# Patient Record
Sex: Male | Born: 1945 | Race: White | Hispanic: No | Marital: Married | Smoking: Never smoker
Health system: Southern US, Community
[De-identification: ages and names within clinical notes are randomized; demographics above are authoritative.]

## PROBLEM LIST (undated history)

## (undated) DIAGNOSIS — R972 Elevated prostate specific antigen [PSA]: Secondary | ICD-10-CM

## (undated) DIAGNOSIS — K219 Gastro-esophageal reflux disease without esophagitis: Secondary | ICD-10-CM

## (undated) DIAGNOSIS — R1013 Epigastric pain: Secondary | ICD-10-CM

## (undated) DIAGNOSIS — I255 Ischemic cardiomyopathy: Secondary | ICD-10-CM

## (undated) DIAGNOSIS — C61 Malignant neoplasm of prostate: Secondary | ICD-10-CM

## (undated) DIAGNOSIS — Z95810 Presence of automatic (implantable) cardiac defibrillator: Secondary | ICD-10-CM

## (undated) DIAGNOSIS — I10 Essential (primary) hypertension: Secondary | ICD-10-CM

## (undated) DIAGNOSIS — I82609 Acute embolism and thrombosis of unspecified veins of unspecified upper extremity: Secondary | ICD-10-CM

## (undated) DIAGNOSIS — K222 Esophageal obstruction: Secondary | ICD-10-CM

## (undated) DIAGNOSIS — C099 Malignant neoplasm of tonsil, unspecified: Secondary | ICD-10-CM

## (undated) DIAGNOSIS — E785 Hyperlipidemia, unspecified: Secondary | ICD-10-CM

## (undated) DIAGNOSIS — Z951 Presence of aortocoronary bypass graft: Secondary | ICD-10-CM

## (undated) DIAGNOSIS — I739 Peripheral vascular disease, unspecified: Secondary | ICD-10-CM

## (undated) DIAGNOSIS — I219 Acute myocardial infarction, unspecified: Secondary | ICD-10-CM

## (undated) DIAGNOSIS — I8290 Acute embolism and thrombosis of unspecified vein: Secondary | ICD-10-CM

## (undated) DIAGNOSIS — I779 Disorder of arteries and arterioles, unspecified: Secondary | ICD-10-CM

## (undated) DIAGNOSIS — F909 Attention-deficit hyperactivity disorder, unspecified type: Secondary | ICD-10-CM

## (undated) DIAGNOSIS — R9430 Abnormal result of cardiovascular function study, unspecified: Secondary | ICD-10-CM

## (undated) DIAGNOSIS — IMO0002 Reserved for concepts with insufficient information to code with codable children: Secondary | ICD-10-CM

## (undated) DIAGNOSIS — Z4502 Encounter for adjustment and management of automatic implantable cardiac defibrillator: Secondary | ICD-10-CM

## (undated) DIAGNOSIS — I2510 Atherosclerotic heart disease of native coronary artery without angina pectoris: Secondary | ICD-10-CM

## (undated) HISTORY — DX: Acute embolism and thrombosis of unspecified veins of unspecified upper extremity: I82.609

## (undated) HISTORY — PX: CORONARY ARTERY BYPASS GRAFT: SHX141

## (undated) HISTORY — PX: OTHER SURGICAL HISTORY: SHX169

## (undated) HISTORY — DX: Esophageal obstruction: K22.2

## (undated) HISTORY — DX: Malignant neoplasm of prostate: C61

## (undated) HISTORY — PX: CYSTECTOMY: SUR359

## (undated) HISTORY — DX: Essential (primary) hypertension: I10

## (undated) HISTORY — DX: Elevated prostate specific antigen (PSA): R97.20

## (undated) HISTORY — DX: Hyperlipidemia, unspecified: E78.5

## (undated) HISTORY — DX: Gastro-esophageal reflux disease without esophagitis: K21.9

## (undated) HISTORY — DX: Abnormal result of cardiovascular function study, unspecified: R94.30

## (undated) HISTORY — DX: Presence of aortocoronary bypass graft: Z95.1

## (undated) HISTORY — DX: Encounter for adjustment and management of automatic implantable cardiac defibrillator: Z45.02

## (undated) HISTORY — DX: Disorder of arteries and arterioles, unspecified: I77.9

## (undated) HISTORY — DX: Peripheral vascular disease, unspecified: I73.9

## (undated) HISTORY — PX: ROTATOR CUFF REPAIR: SHX139

## (undated) HISTORY — DX: Ischemic cardiomyopathy: I25.5

## (undated) HISTORY — DX: Malignant neoplasm of tonsil, unspecified: C09.9

## (undated) HISTORY — DX: Attention-deficit hyperactivity disorder, unspecified type: F90.9

## (undated) HISTORY — DX: Acute embolism and thrombosis of unspecified vein: I82.90

## (undated) HISTORY — DX: Epigastric pain: R10.13

## (undated) HISTORY — PX: HAND CONTRACTURE RELEASE: SHX1724

## (undated) HISTORY — DX: Reserved for concepts with insufficient information to code with codable children: IMO0002

## (undated) HISTORY — DX: Atherosclerotic heart disease of native coronary artery without angina pectoris: I25.10

---

## 1996-11-28 DIAGNOSIS — I219 Acute myocardial infarction, unspecified: Secondary | ICD-10-CM

## 1996-11-28 HISTORY — DX: Acute myocardial infarction, unspecified: I21.9

## 1999-12-08 ENCOUNTER — Encounter: Payer: Self-pay | Admitting: Internal Medicine

## 1999-12-08 ENCOUNTER — Encounter: Admission: RE | Admit: 1999-12-08 | Discharge: 1999-12-08 | Payer: Self-pay | Admitting: Internal Medicine

## 2002-03-13 ENCOUNTER — Encounter: Admission: RE | Admit: 2002-03-13 | Discharge: 2002-03-13 | Payer: Self-pay | Admitting: Orthopedic Surgery

## 2002-03-13 ENCOUNTER — Encounter: Payer: Self-pay | Admitting: Orthopedic Surgery

## 2002-03-14 ENCOUNTER — Encounter (INDEPENDENT_AMBULATORY_CARE_PROVIDER_SITE_OTHER): Payer: Self-pay | Admitting: Specialist

## 2002-03-14 ENCOUNTER — Ambulatory Visit (HOSPITAL_BASED_OUTPATIENT_CLINIC_OR_DEPARTMENT_OTHER): Admission: RE | Admit: 2002-03-14 | Discharge: 2002-03-14 | Payer: Self-pay | Admitting: Orthopedic Surgery

## 2003-11-29 HISTORY — PX: TONSILLECTOMY: SUR1361

## 2004-01-07 ENCOUNTER — Encounter: Admission: RE | Admit: 2004-01-07 | Discharge: 2004-01-07 | Payer: Self-pay | Admitting: Otolaryngology

## 2004-01-09 ENCOUNTER — Ambulatory Visit (HOSPITAL_COMMUNITY): Admission: RE | Admit: 2004-01-09 | Discharge: 2004-01-09 | Payer: Self-pay | Admitting: Otolaryngology

## 2004-01-09 ENCOUNTER — Encounter (INDEPENDENT_AMBULATORY_CARE_PROVIDER_SITE_OTHER): Payer: Self-pay | Admitting: *Deleted

## 2004-01-09 ENCOUNTER — Ambulatory Visit (HOSPITAL_BASED_OUTPATIENT_CLINIC_OR_DEPARTMENT_OTHER): Admission: RE | Admit: 2004-01-09 | Discharge: 2004-01-09 | Payer: Self-pay | Admitting: Otolaryngology

## 2004-01-19 ENCOUNTER — Ambulatory Visit: Admission: RE | Admit: 2004-01-19 | Discharge: 2004-04-07 | Payer: Self-pay | Admitting: *Deleted

## 2004-01-27 ENCOUNTER — Ambulatory Visit (HOSPITAL_COMMUNITY): Admission: RE | Admit: 2004-01-27 | Discharge: 2004-01-27 | Payer: Self-pay | Admitting: *Deleted

## 2004-02-02 ENCOUNTER — Encounter: Admission: RE | Admit: 2004-02-02 | Discharge: 2004-02-02 | Payer: Self-pay | Admitting: Dentistry

## 2004-02-05 ENCOUNTER — Ambulatory Visit (HOSPITAL_COMMUNITY): Admission: RE | Admit: 2004-02-05 | Discharge: 2004-02-05 | Payer: Self-pay | Admitting: *Deleted

## 2004-05-07 ENCOUNTER — Ambulatory Visit: Admission: RE | Admit: 2004-05-07 | Discharge: 2004-05-07 | Payer: Self-pay | Admitting: *Deleted

## 2004-05-20 ENCOUNTER — Ambulatory Visit (HOSPITAL_COMMUNITY): Admission: RE | Admit: 2004-05-20 | Discharge: 2004-05-20 | Payer: Self-pay | Admitting: Orthopaedic Surgery

## 2004-08-13 ENCOUNTER — Ambulatory Visit: Admission: RE | Admit: 2004-08-13 | Discharge: 2004-08-13 | Payer: Self-pay | Admitting: *Deleted

## 2004-08-27 ENCOUNTER — Ambulatory Visit: Admission: RE | Admit: 2004-08-27 | Discharge: 2004-08-27 | Payer: Self-pay | Admitting: *Deleted

## 2004-10-18 ENCOUNTER — Ambulatory Visit: Payer: Self-pay | Admitting: Cardiology

## 2004-11-25 ENCOUNTER — Ambulatory Visit (HOSPITAL_COMMUNITY): Admission: RE | Admit: 2004-11-25 | Discharge: 2004-11-25 | Payer: Self-pay | Admitting: *Deleted

## 2004-11-26 ENCOUNTER — Ambulatory Visit (HOSPITAL_COMMUNITY): Admission: RE | Admit: 2004-11-26 | Discharge: 2004-11-26 | Payer: Self-pay | Admitting: *Deleted

## 2004-12-03 ENCOUNTER — Ambulatory Visit: Admission: RE | Admit: 2004-12-03 | Discharge: 2004-12-03 | Payer: Self-pay | Admitting: *Deleted

## 2004-12-08 ENCOUNTER — Ambulatory Visit: Admission: RE | Admit: 2004-12-08 | Discharge: 2004-12-08 | Payer: Self-pay | Admitting: *Deleted

## 2004-12-10 ENCOUNTER — Ambulatory Visit: Payer: Self-pay | Admitting: Cardiology

## 2005-03-10 ENCOUNTER — Ambulatory Visit: Payer: Self-pay | Admitting: Cardiology

## 2005-06-14 ENCOUNTER — Ambulatory Visit: Payer: Self-pay | Admitting: Cardiology

## 2005-11-15 ENCOUNTER — Encounter: Payer: Self-pay | Admitting: Cardiology

## 2005-11-15 ENCOUNTER — Ambulatory Visit: Payer: Self-pay

## 2006-01-03 ENCOUNTER — Ambulatory Visit: Payer: Self-pay | Admitting: Cardiology

## 2006-04-06 ENCOUNTER — Ambulatory Visit: Payer: Self-pay | Admitting: Cardiology

## 2006-05-04 ENCOUNTER — Ambulatory Visit: Payer: Self-pay | Admitting: Internal Medicine

## 2006-10-03 ENCOUNTER — Ambulatory Visit (HOSPITAL_COMMUNITY): Admission: RE | Admit: 2006-10-03 | Discharge: 2006-10-04 | Payer: Self-pay | Admitting: Orthopaedic Surgery

## 2006-12-05 ENCOUNTER — Ambulatory Visit: Payer: Self-pay | Admitting: Cardiovascular Disease

## 2006-12-05 HISTORY — PX: CARDIAC CATHETERIZATION: SHX172

## 2006-12-06 ENCOUNTER — Inpatient Hospital Stay (HOSPITAL_COMMUNITY): Admission: RE | Admit: 2006-12-06 | Discharge: 2006-12-07 | Payer: Self-pay | Admitting: Cardiovascular Disease

## 2006-12-12 ENCOUNTER — Inpatient Hospital Stay (HOSPITAL_COMMUNITY): Admission: EM | Admit: 2006-12-12 | Discharge: 2006-12-15 | Payer: Self-pay | Admitting: Emergency Medicine

## 2006-12-18 ENCOUNTER — Ambulatory Visit: Payer: Self-pay | Admitting: Internal Medicine

## 2006-12-20 ENCOUNTER — Ambulatory Visit: Payer: Self-pay

## 2006-12-20 ENCOUNTER — Ambulatory Visit: Payer: Self-pay | Admitting: Cardiovascular Disease

## 2006-12-25 ENCOUNTER — Ambulatory Visit: Payer: Self-pay | Admitting: Internal Medicine

## 2007-01-08 ENCOUNTER — Ambulatory Visit: Payer: Self-pay | Admitting: Cardiology

## 2007-01-17 ENCOUNTER — Ambulatory Visit: Payer: Self-pay | Admitting: Internal Medicine

## 2007-01-18 LAB — CONVERTED CEMR LAB
Basophils Absolute: 0 10*3/uL (ref 0.0–0.1)
Eosinophils Absolute: 0.2 10*3/uL (ref 0.0–0.6)
Hemoglobin: 14.1 g/dL (ref 13.0–17.0)
Lymphocytes Relative: 18.6 % (ref 12.0–46.0)
MCHC: 33 g/dL (ref 30.0–36.0)
Monocytes Absolute: 0.3 10*3/uL (ref 0.2–0.7)
Monocytes Relative: 5.5 % (ref 3.0–11.0)
Neutro Abs: 3.9 10*3/uL (ref 1.4–7.7)

## 2007-01-22 ENCOUNTER — Ambulatory Visit: Payer: Self-pay | Admitting: Internal Medicine

## 2007-01-22 ENCOUNTER — Ambulatory Visit: Payer: Self-pay | Admitting: Cardiology

## 2007-02-12 ENCOUNTER — Ambulatory Visit: Payer: Self-pay | Admitting: Internal Medicine

## 2007-05-16 ENCOUNTER — Ambulatory Visit: Payer: Self-pay | Admitting: Internal Medicine

## 2007-08-15 ENCOUNTER — Ambulatory Visit: Payer: Self-pay | Admitting: Internal Medicine

## 2008-01-01 ENCOUNTER — Ambulatory Visit: Payer: Self-pay | Admitting: Internal Medicine

## 2008-04-01 ENCOUNTER — Ambulatory Visit: Payer: Self-pay | Admitting: Internal Medicine

## 2008-07-01 ENCOUNTER — Ambulatory Visit: Payer: Self-pay | Admitting: Internal Medicine

## 2008-08-13 ENCOUNTER — Telehealth: Payer: Self-pay | Admitting: Internal Medicine

## 2008-08-22 ENCOUNTER — Ambulatory Visit: Payer: Self-pay | Admitting: Internal Medicine

## 2008-08-22 LAB — CONVERTED CEMR LAB
AST: 27 units/L (ref 0–37)
Albumin: 3.9 g/dL (ref 3.5–5.2)
Alkaline Phosphatase: 76 units/L (ref 39–117)
BUN: 23 mg/dL (ref 6–23)
Chloride: 105 meq/L (ref 96–112)
Cholesterol: 176 mg/dL (ref 0–200)
GFR calc non Af Amer: 91 mL/min
Glucose, Bld: 105 mg/dL — ABNORMAL HIGH (ref 70–99)
LDL Cholesterol: 83 mg/dL (ref 0–99)
Potassium: 4.4 meq/L (ref 3.5–5.1)

## 2008-08-23 ENCOUNTER — Encounter: Payer: Self-pay | Admitting: Internal Medicine

## 2008-08-23 DIAGNOSIS — K222 Esophageal obstruction: Secondary | ICD-10-CM | POA: Insufficient documentation

## 2008-08-23 DIAGNOSIS — Z8719 Personal history of other diseases of the digestive system: Secondary | ICD-10-CM | POA: Insufficient documentation

## 2008-08-23 DIAGNOSIS — F909 Attention-deficit hyperactivity disorder, unspecified type: Secondary | ICD-10-CM | POA: Insufficient documentation

## 2008-08-23 DIAGNOSIS — C099 Malignant neoplasm of tonsil, unspecified: Secondary | ICD-10-CM | POA: Insufficient documentation

## 2008-10-08 ENCOUNTER — Encounter: Payer: Self-pay | Admitting: Internal Medicine

## 2008-10-13 ENCOUNTER — Ambulatory Visit: Payer: Self-pay | Admitting: Internal Medicine

## 2008-10-21 ENCOUNTER — Ambulatory Visit (HOSPITAL_COMMUNITY): Admission: RE | Admit: 2008-10-21 | Discharge: 2008-10-21 | Payer: Self-pay | Admitting: Orthopaedic Surgery

## 2008-10-29 ENCOUNTER — Encounter: Payer: Self-pay | Admitting: Internal Medicine

## 2008-12-01 ENCOUNTER — Encounter: Payer: Self-pay | Admitting: Internal Medicine

## 2008-12-22 ENCOUNTER — Encounter: Payer: Self-pay | Admitting: Internal Medicine

## 2009-01-12 ENCOUNTER — Encounter: Payer: Self-pay | Admitting: Internal Medicine

## 2009-01-12 ENCOUNTER — Ambulatory Visit: Payer: Self-pay | Admitting: Cardiology

## 2009-01-12 LAB — CONVERTED CEMR LAB
AST: 25 units/L (ref 0–37)
HDL: 70.1 mg/dL (ref >39.0)
Total Bilirubin: 1.1 mg/dL (ref 0.3–1.2)
Total CHOL/HDL Ratio: 2.8

## 2009-01-13 ENCOUNTER — Emergency Department (HOSPITAL_COMMUNITY): Admission: EM | Admit: 2009-01-13 | Discharge: 2009-01-13 | Payer: Self-pay | Admitting: Emergency Medicine

## 2009-01-14 ENCOUNTER — Encounter: Payer: Self-pay | Admitting: Internal Medicine

## 2009-01-14 ENCOUNTER — Ambulatory Visit: Payer: Self-pay | Admitting: Cardiology

## 2009-02-17 ENCOUNTER — Encounter: Payer: Self-pay | Admitting: Internal Medicine

## 2009-02-17 ENCOUNTER — Ambulatory Visit: Payer: Self-pay | Admitting: Internal Medicine

## 2009-03-30 ENCOUNTER — Encounter: Payer: Self-pay | Admitting: Internal Medicine

## 2009-05-19 ENCOUNTER — Ambulatory Visit: Payer: Self-pay | Admitting: Internal Medicine

## 2009-05-21 ENCOUNTER — Telehealth: Payer: Self-pay | Admitting: Cardiology

## 2009-06-01 ENCOUNTER — Encounter: Payer: Self-pay | Admitting: Internal Medicine

## 2009-07-16 ENCOUNTER — Encounter: Payer: Self-pay | Admitting: Cardiology

## 2009-08-25 ENCOUNTER — Ambulatory Visit: Payer: Self-pay | Admitting: Internal Medicine

## 2009-08-27 ENCOUNTER — Encounter: Payer: Self-pay | Admitting: Internal Medicine

## 2009-09-03 ENCOUNTER — Encounter: Payer: Self-pay | Admitting: Internal Medicine

## 2009-10-30 ENCOUNTER — Ambulatory Visit: Payer: Self-pay | Admitting: Cardiology

## 2009-11-05 ENCOUNTER — Encounter: Payer: Self-pay | Admitting: Cardiology

## 2009-11-05 LAB — CONVERTED CEMR LAB
ALT: 19 units/L (ref 0–53)
AST: 24 units/L (ref 0–37)
Alkaline Phosphatase: 85 units/L (ref 39–117)
Bilirubin, Direct: 0.1 mg/dL (ref 0.0–0.3)
Cholesterol: 173 mg/dL (ref 0–200)
LDL Cholesterol: 91 mg/dL (ref 0–99)
Total Bilirubin: 0.9 mg/dL (ref 0.3–1.2)
Total CHOL/HDL Ratio: 3

## 2009-11-24 ENCOUNTER — Ambulatory Visit: Payer: Self-pay | Admitting: Internal Medicine

## 2009-11-26 ENCOUNTER — Encounter (INDEPENDENT_AMBULATORY_CARE_PROVIDER_SITE_OTHER): Payer: Self-pay | Admitting: *Deleted

## 2009-12-17 ENCOUNTER — Encounter: Payer: Self-pay | Admitting: Internal Medicine

## 2009-12-21 ENCOUNTER — Telehealth (INDEPENDENT_AMBULATORY_CARE_PROVIDER_SITE_OTHER): Payer: Self-pay | Admitting: *Deleted

## 2009-12-23 ENCOUNTER — Ambulatory Visit (HOSPITAL_COMMUNITY): Admission: RE | Admit: 2009-12-23 | Discharge: 2009-12-23 | Payer: Self-pay | Admitting: Orthopedic Surgery

## 2009-12-30 ENCOUNTER — Ambulatory Visit: Payer: Self-pay | Admitting: Cardiology

## 2010-03-02 ENCOUNTER — Ambulatory Visit: Payer: Self-pay | Admitting: Internal Medicine

## 2010-04-28 ENCOUNTER — Encounter: Payer: Self-pay | Admitting: Cardiology

## 2010-05-04 ENCOUNTER — Ambulatory Visit: Payer: Self-pay | Admitting: Internal Medicine

## 2010-05-04 DIAGNOSIS — J019 Acute sinusitis, unspecified: Secondary | ICD-10-CM | POA: Insufficient documentation

## 2010-06-01 ENCOUNTER — Ambulatory Visit: Payer: Self-pay | Admitting: Internal Medicine

## 2010-06-08 ENCOUNTER — Encounter: Payer: Self-pay | Admitting: Internal Medicine

## 2010-09-01 ENCOUNTER — Encounter: Payer: Self-pay | Admitting: Internal Medicine

## 2010-09-02 ENCOUNTER — Ambulatory Visit: Payer: Self-pay | Admitting: Internal Medicine

## 2010-09-08 ENCOUNTER — Encounter: Payer: Self-pay | Admitting: Internal Medicine

## 2010-10-06 ENCOUNTER — Ambulatory Visit (HOSPITAL_COMMUNITY): Admission: RE | Admit: 2010-10-06 | Discharge: 2010-10-06 | Payer: Self-pay | Admitting: Orthopedic Surgery

## 2010-10-11 ENCOUNTER — Ambulatory Visit (HOSPITAL_COMMUNITY): Admission: RE | Admit: 2010-10-11 | Discharge: 2010-10-11 | Payer: Self-pay | Admitting: Orthopedic Surgery

## 2010-10-26 ENCOUNTER — Telehealth: Payer: Self-pay | Admitting: Internal Medicine

## 2010-11-28 DIAGNOSIS — C61 Malignant neoplasm of prostate: Secondary | ICD-10-CM

## 2010-11-28 HISTORY — DX: Malignant neoplasm of prostate: C61

## 2010-12-09 ENCOUNTER — Ambulatory Visit
Admission: RE | Admit: 2010-12-09 | Discharge: 2010-12-09 | Payer: Self-pay | Source: Home / Self Care | Attending: Internal Medicine | Admitting: Internal Medicine

## 2010-12-10 ENCOUNTER — Encounter: Payer: Self-pay | Admitting: Internal Medicine

## 2010-12-20 ENCOUNTER — Telehealth: Payer: Self-pay | Admitting: Cardiology

## 2010-12-21 ENCOUNTER — Other Ambulatory Visit: Payer: Self-pay | Admitting: Cardiology

## 2010-12-21 ENCOUNTER — Ambulatory Visit
Admission: RE | Admit: 2010-12-21 | Discharge: 2010-12-21 | Payer: Self-pay | Source: Home / Self Care | Attending: Cardiology | Admitting: Cardiology

## 2010-12-21 LAB — LIPID PANEL
Cholesterol: 187 mg/dL (ref 0–200)
Triglycerides: 105 mg/dL (ref 0.0–149.0)

## 2010-12-21 LAB — HEPATIC FUNCTION PANEL
ALT: 25 U/L (ref 0–53)
Total Bilirubin: 0.9 mg/dL (ref 0.3–1.2)
Total Protein: 6.6 g/dL (ref 6.0–8.3)

## 2010-12-24 ENCOUNTER — Encounter: Payer: Self-pay | Admitting: Cardiology

## 2010-12-26 ENCOUNTER — Encounter (INDEPENDENT_AMBULATORY_CARE_PROVIDER_SITE_OTHER): Payer: Self-pay | Admitting: *Deleted

## 2010-12-28 NOTE — Letter (Signed)
Summary: Remote Device Check  [employer], Main Office  [address]   [place], [place] [address]   Phone: [phone]  Fax: [phone]     September 08, 2010 MRN: [id number]   [facility] [address] [place], [place]  [address]   Dear Mr. Brutus,   Your remote transmission was recieved and reviewed by your physician.  All diagnostics were within normal limits for you.  __X___Your next transmission is scheduled for:  12-09-2010.  Please transmit at any time this day.  If you have a wireless device your transmission will be sent automatically.   Sincerely,  Vella Kohler

## 2010-12-28 NOTE — Letter (Signed)
Summary: Lipid reminder  [facility], Main Office  [address]   [place], [place] [address]   Phone: [phone]  Fax: [phone]        April 28, 2010 MRN: [id number]    [facility] [address] [place], [place]  [address]    Dear [name],  Our records indicate it is time to check your cholesterol.  Please call our office to schedule an appt for labwork.  Please remember it is a fasting lab.     Sincerely,  Meredith Staggers, RN Willa Rough, MD This letter has been electronically signed by your physician.

## 2010-12-28 NOTE — Letter (Signed)
Summary: [facility]  [facility]   Imported By: Sherian Rein 09/18/2010 10:18:52  _____________________________________________________________________  External Attachment:    Type:   Image     Comment:   External Document

## 2010-12-28 NOTE — Progress Notes (Signed)
  Phone Note From Other Clinic   Caller: Liborio Nixon Details for Reason: Pt.Information Initial call taken by: Denny Peon    Faxed 12 lead over to [facility] to fax [phone] [facility]  December 21, 2009 12:54 PM

## 2010-12-28 NOTE — Assessment & Plan Note (Signed)
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   He is s/p ICD implant.  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  He received an ICD in 2008. is not having any chest pain.  He has no signs of heart failure.  He still does some carpentry.  He notes that he takes 50 mg of carvedilol once daily because he cannot remember to take his evening dose.  Current Medications (verified): 1)  Nexium 40 Mg Cpdr (Esomeprazole Magnesium) .Marland Kitchen.. 1 Once Daily 2)  Lipitor 80 Mg Tabs (Atorvastatin Calcium) .... Take 1 Tablet By Mouth Once A Day 3)  Carvedilol 25 Mg Tabs (Carvedilol) .... 2 Tabs Once Daily 4)  Diovan 80 Mg Tabs (Valsartan) .... Take 1 Tablet By Mouth Once A Day 5)  Vyvanse 70 Mg Caps (Lisdexamfetamine Dimesylate) .... Take 2 Tablet By Mouth Every Morning 6)  Lexapro 20 Mg Tabs (Escitalopram Oxalate) .... Take 1 Tablet By Mouth Once A Day 7)  Aspirin 81 Mg  Tabs (Aspirin) .... Take 1 Tablet By Mouth Once A Day 8)  Multivitamins   Tabs (Multiple Vitamin) .... Take 1 Tablet By Mouth Once A Day 9)  Ocuvite Preservision  Tabs (Multiple Vitamins-Minerals) .Marland Kitchen.. 1 By Mouth Two Times A Day 10)  Buspirone Hcl 15 Mg Tabs (Buspirone Hcl) .Marland Kitchen.. 1 Tab Once Daily 11)  Elations Supplement .Marland KitchenMarland Kitchen. 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 catheterization 2008... all grafts patent CABG... 1991  HYPERTENSION  (ICD-401.9) HYPERLIPIDEMIA (ICD-272.4) GERD Left arm venous thrombosis  Past Surgical History: Last updated: 01/12/2009 Coronary artery bypass graft- Rhea Pink  '91 Rotator cuff repair '05 Tonsillectomy '05 for malignant lesion, w/  XRT Cystectomy hand ICD placement L thumb surgery  Review of Systems  The patient denies chest pain, syncope, dyspnea on exertion, and peripheral edema.    Vital Signs:  Patient profile:   65 year old male Height:      72 inches Weight:      200 pounds BMI:     27.22 Pulse rate:   49 / minute BP sitting:   140 / 80  (left arm)  Vitals Entered By: Laurance Flatten CMA (March 02, 2010 10:36 AM)  Physical Exam  General:  patient is stable in general. Head:  normocephalic and no abnormalities observed.   Eyes:  no xanthelasma. Mouth:  Oral mucosa and oropharynx without lesions or exudates.  Teeth in good repair. Neck:  no jugular venous distention. Chest Wall:  Well healed device incision. Lungs:  lungs are clear.  Respiratory effort is nonlabored. Heart:  cardiac exam reveals S1-S2.  No clicks or significant murmurs. PMI is enlarged and laterally displaced. Abdomen:  abdomen is soft. Msk:  patient is wearing a post surgical cast on his right hand area Extremities:  no peripheral edema. Neurologic:  alert & oriented X3, cranial nerves II-XII intact, strength normal in all extremities, sensation intact to pinprick, gait normal, and DTRs symmetrical  and normal.      ICD Specifications Following MD:  Lewayne Bunting, MD     ICD Vendor:  [employer]     ICD Model Number:  7232     ICD Serial Number:  [id number] ICD DOI:  12/05/2006     ICD Implanting MD:  Lewayne Bunting, MD  Lead 1:    Location: RV     DOI: 12/05/2006     Model #: 1308     Serial #: [id number]     Status: active  Indications::  ICM, VT   ICD Follow Up Remote Check?  No Battery Voltage:  3.10 V     Charge Time:  7.84 seconds     ICD Dependent:  No       ICD Device  Measurements Right Ventricle:  Amplitude: 16.3 mV, Impedance: 512 ohms, Threshold: 1.0 V at 0.2 msec Shock Impedance: 41/53 ohms   Episodes Shock:  0     ATP:  0     Nonsustained:  2      Brady Parameters Mode VVI     Lower Rate Limit:  40      Tachy Zones VF:  194     VT:  250     VT1:  150     Next Remote Date:  06/01/2010     Next Cardiology Appt Due:  02/27/2011 Tech Comments:  No parameter changes.  Device function normal.  Checked by industry.  Carelink transmissions every 3 months.  ROV 1 year with Dr. [name]. Altha Harm, LPN  March 03, 9527 10:27 AM  MD Comments:  Agree with above.  Impression & Recommendations:  Problem # 1:  AUTOMATIC IMPLANTABLE CARDIAC DEFIBRILLATOR SITU (ICD-V45.02) His device is working normally.  He will followup in several months.  Problem # 2:  CARDIOMYOPATHY, ISCHEMIC (ICD-414.8) He denies anginal symptoms  Continue current meds except we will see if we can switch him to long acting Coreg. His updated medication list for this problem includes:    Carvedilol 25 Mg Tabs (Carvedilol) .Marland Kitchen... 2 tabs once daily    Diovan 80 Mg Tabs (Valsartan) .Marland Kitchen... Take 1 tablet by mouth once a day    Aspirin 81 Mg Tabs (Aspirin) .Marland Kitchen... Take 1 tablet by mouth once a day  Problem # 3:  ESSENTIAL HYPERTENSION (ICD-401.9) Continue current meds except for the change in carvedilol. His updated medication list for this problem includes:    Carvedilol 25 Mg Tabs (Carvedilol) .Marland Kitchen... 2 tabs once daily    Diovan 80 Mg Tabs (Valsartan) .Marland Kitchen... Take 1 tablet by mouth once a day    Aspirin 81 Mg Tabs (Aspirin) .Marland Kitchen... Take 1 tablet by mouth once a day  Patient Instructions: 1)  Your physician recommends that you schedule a follow-up appointment in: 12 months with Dr [name]

## 2010-12-28 NOTE — Cardiovascular Report (Signed)
Summary: Office Visit Remote   Office Visit Remote   Imported By: Roderic Ovens 06/09/2010 12:03:46  _____________________________________________________________________  External Attachment:    Type:   Image     Comment:   External Document

## 2010-12-28 NOTE — Assessment & Plan Note (Signed)
Summary: dr men pt  cough/bloody mucus  stc   Vital Signs:  Patient profile:   65 year old male Height:      72 inches Weight:      195.50 pounds BMI:     26.61 O2 Sat:      96 % on Room air Temp:     98.3 degrees F oral Pulse rate:   63 / minute Pulse rhythm:   regular Resp:     16 per minute BP sitting:   140 / 70  (left arm) Cuff size:   large  Vitals Entered By: Rock Nephew CMA (May 04, 2010 11:49 AM)  Nutrition Counseling: Patient's BMI is greater than 25 and therefore counseled on weight management options.  O2 Flow:  Room air  Primary Care Provider:  Jacques Navy MD  CC:  URI symptoms.  History of Present Illness:  URI Symptoms      This is a 65 year old man who presents with URI symptoms.  The symptoms began 3 weeks ago.  The severity is described as mild.  The patient reports nasal congestion, purulent nasal discharge, sore throat, productive cough, and sick contacts, but denies dry cough and earache.  Associated symptoms include fever of 100.5-103 degrees.  The patient denies stiff neck, dyspnea, wheezing, rash, vomiting, diarrhea, use of an antipyretic, and response to antipyretic.  The patient denies sneezing, seasonal symptoms, headache, muscle aches, and severe fatigue.  Risk factors for Strep sinusitis include unilateral facial pain, unilateral nasal discharge, poor response to decongestant, double sickening, and tooth pain.  The patient denies the following risk factors for Strep sinusitis: Strep exposure, tender adenopathy, and absence of cough.    Preventive Screening-Counseling & Management  Alcohol-Tobacco     Alcohol drinks/day: 3     Alcohol type: beer     Smoking Status: quit      Drug Use:  no.    Medications Prior to Update: 1)  Nexium 40 Mg Cpdr (Esomeprazole Magnesium) .Marland Kitchen.. 1 Once Daily 2)  Lipitor 80 Mg Tabs (Atorvastatin Calcium) .... Take 1 Tablet By Mouth Once A Day 3)  Carvedilol 25 Mg Tabs (Carvedilol) .... 2 Tabs Once Daily 4)   Diovan 80 Mg Tabs (Valsartan) .... Take 1 Tablet By Mouth Once A Day 5)  Vyvanse 70 Mg Caps (Lisdexamfetamine Dimesylate) .... Take 2 Tablet By Mouth Every Morning 6)  Lexapro 20 Mg Tabs (Escitalopram Oxalate) .... Take 1 Tablet By Mouth Once A Day 7)  Aspirin 81 Mg  Tabs (Aspirin) .... Take 1 Tablet By Mouth Once A Day 8)  Multivitamins   Tabs (Multiple Vitamin) .... Take 1 Tablet By Mouth Once A Day 9)  Ocuvite Preservision  Tabs (Multiple Vitamins-Minerals) .Marland Kitchen.. 1 By Mouth Two Times A Day 10)  Buspirone Hcl 15 Mg Tabs (Buspirone Hcl) .Marland Kitchen.. 1 Tab Once Daily 11)  Elations Supplement .Marland KitchenMarland Kitchen. 1 8oz Drink Once Daily  Current Medications (verified): 1)  Nexium 40 Mg Cpdr (Esomeprazole Magnesium) .Marland Kitchen.. 1 Once Daily 2)  Lipitor 80 Mg Tabs (Atorvastatin Calcium) .... Take 1 Tablet By Mouth Once A Day 3)  Carvedilol 25 Mg Tabs (Carvedilol) .... 2 Tabs Once Daily 4)  Diovan 80 Mg Tabs (Valsartan) .... Take 1 Tablet By Mouth Once A Day 5)  Vyvanse 70 Mg Caps (Lisdexamfetamine Dimesylate) .... Take 2 Tablet By Mouth Every Morning 6)  Lexapro 20 Mg Tabs (Escitalopram Oxalate) .... Take 1 Tablet By Mouth Once A Day 7)  Aspirin 81  Mg  Tabs (Aspirin) .... Take 1 Tablet By Mouth Once A Day 8)  Multivitamins   Tabs (Multiple Vitamin) .... Take 1 Tablet By Mouth Once A Day 9)  Ocuvite Preservision  Tabs (Multiple Vitamins-Minerals) .Marland Kitchen.. 1 By Mouth Two Times A Day 10)  Buspirone Hcl 15 Mg Tabs (Buspirone Hcl) .Marland Kitchen.. 1 Tab Once Daily 11)  Elations Supplement .Marland KitchenMarland Kitchen. 1 8oz Drink Once Daily 12)  Cefuroxime Axetil 500 Mg Tabs (Cefuroxime Axetil) .... 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 placed 2008 (episodes of sustained  monomorphic VT in the past) ESOPHAGEAL STRICTURE (ICD-530.3) DYSPEPSIA, HX OF (ICD-V12.79) CAD (ICD-414.00)... catheterization 2008... all grafts patent CABG... 1991  HYPERTENSION (ICD-401.9) HYPERLIPIDEMIA (ICD-272.4) GERD Left arm venous thrombosis  Past Surgical History: Last updated: Jan 23, 2009 Coronary artery bypass graft- Krista Blue, SVG-OM,Cx,RCA  '91 Rotator cuff repair '05 Tonsillectomy '05 for malignant lesion, w/  XRT Cystectomy hand ICD placement L thumb surgery  Family History: Last updated: Jan 23, 2009 father- deceased @69 : CAD/CHF mother -30: physically stable, alzheimer's Neg-prostate or colon cancer; DM He has 1 brother who has had bypass surgery and is living  Social History: Last updated: 01/23/2009 [employer] - 4 years married '76-  0 children work: retired, Administrator, Civil Service for [employer] marriage in good health; wife in good health. Tobacco Use - Former.  Alcohol Use - yes Drug Use - no  Risk Factors: Alcohol Use: 3 (05/04/2010) Caffeine Use: 1 (08/22/2008) Exercise: no (08/22/2008)  Risk Factors: Smoking Status: quit (05/04/2010)  Family History: Reviewed history from 01-23-09 and no changes required. father- deceased @69 : CAD/CHF mother -43: physically stable, alzheimer's Neg-prostate or colon cancer; DM He has 1 brother who has had bypass surgery and is living  Social History: Reviewed history from January 23, 2009 and no changes required. 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 Tobacco Use - Former.  Alcohol Use - yes Drug Use - no  Review of Systems       The patient complains of prolonged cough.  The patient denies anorexia, fever, weight loss, decreased hearing, hoarseness, chest pain, syncope, dyspnea on exertion, peripheral edema, headaches, hemoptysis, abdominal pain, hematuria, suspicious skin lesions,  enlarged lymph nodes, and angioedema.    Physical Exam  General:  alert, well-developed, well-nourished, well-hydrated, appropriate dress, normal appearance, healthy-appearing, cooperative to examination, and good hygiene.   Head:  normocephalic, atraumatic, no abnormalities observed, and no abnormalities palpated.   Eyes:  No corneal or conjunctival inflammation noted. EOMI. Perrla. Funduscopic exam benign, without hemorrhages, exudates or papilledema. Vision grossly normal. Ears:  External ear exam shows no significant lesions or deformities.  Otoscopic examination reveals clear canals, tympanic membranes are intact bilaterally without bulging, retraction, inflammation or discharge. Hearing is grossly normal bilaterally. Nose:  External nasal examination shows no deformity or inflammation. Nasal mucosa are pink and moist without lesions or exudates. Mouth:  Oral mucosa and oropharynx without lesions or exudates.  Teeth in good repair. Neck:  supple, full ROM, no masses, no thyromegaly, no thyroid nodules or tenderness, no JVD, normal carotid upstroke, and no carotid bruits.   Lungs:  normal respiratory effort, no intercostal retractions, no accessory muscle use,  normal breath sounds, no dullness, no fremitus, no crackles, and no wheezes.   Heart:  normal rate, regular rhythm, no murmur, no gallop, no rub, and no JVD.   Abdomen:  soft, non-tender, normal bowel sounds, no distention, no masses, no guarding, no rigidity, no rebound tenderness, no hepatomegaly, and no splenomegaly.   Msk:  normal ROM, no joint tenderness, no joint swelling, no joint warmth, no redness over joints, no joint deformities, no joint instability, and no crepitation.   Pulses:  R and L carotid,radial,femoral,dorsalis pedis and posterior tibial pulses are full and equal bilaterally Extremities:  No clubbing, cyanosis, edema, or deformity noted with normal full range of motion of all joints.   Neurologic:  No cranial nerve  deficits noted. Station and gait are normal. Plantar reflexes are down-going bilaterally. DTRs are symmetrical throughout. Sensory, motor and coordinative functions appear intact. Skin:  turgor normal, color normal, no rashes, no suspicious lesions, no ecchymoses, no petechiae, no purpura, no ulcerations, and no edema.   Cervical Nodes:  no anterior cervical adenopathy and no posterior cervical adenopathy.   Axillary Nodes:  no R axillary adenopathy and no L axillary adenopathy.   Inguinal Nodes:  no R inguinal adenopathy and no L inguinal adenopathy.   Psych:  Cognition and judgment appear intact. Alert and cooperative with normal attention span and concentration. No apparent delusions, illusions, hallucinations   Impression & Recommendations:  Problem # 1:  SINUSITIS- ACUTE-NOS (ICD-461.9) Assessment New  His updated medication list for this problem includes:    Cefuroxime Axetil 500 Mg Tabs (Cefuroxime axetil) ..... One by mouth two times a day for 10 days  Instructed on treatment. Call if symptoms persist or worsen.   Problem # 2:  COUGH (ICD-786.2) Assessment: New look for pna, mass, edema Orders: T-2 View CXR (71020TC)  Complete Medication List: 1)  Nexium 40 Mg Cpdr (Esomeprazole magnesium) .Marland Kitchen.. 1 once daily 2)  Lipitor 80 Mg Tabs (Atorvastatin calcium) .... Take 1 tablet by mouth once a day 3)  Carvedilol 25 Mg Tabs (Carvedilol) .... 2 tabs once daily 4)  Diovan 80 Mg Tabs (Valsartan) .... Take 1 tablet by mouth once a day 5)  Vyvanse 70 Mg Caps (Lisdexamfetamine dimesylate) .... Take 2 tablet by mouth every morning 6)  Lexapro 20 Mg Tabs (Escitalopram oxalate) .... Take 1 tablet by mouth once a day 7)  Aspirin 81 Mg Tabs (Aspirin) .... Take 1 tablet by mouth once a day 8)  Multivitamins Tabs (Multiple vitamin) .... Take 1 tablet by mouth once a day 9)  Ocuvite Preservision Tabs (Multiple vitamins-minerals) .Marland Kitchen.. 1 by mouth two times a day 10)  Buspirone Hcl 15 Mg Tabs  (Buspirone hcl) .Marland Kitchen.. 1 tab once daily 11)  Elations Supplement  .Marland KitchenMarland Kitchen. 1 8oz drink once daily 12)  Cefuroxime Axetil 500 Mg Tabs (Cefuroxime axetil) .... One by mouth two times a day for 10 days  Patient Instructions: 1)  Please schedule a follow-up appointment in 2 weeks. 2)  Take your antibiotic as prescribed until ALL of it is gone, but stop if you develop a rash or swelling and contact our office as soon as possible. 3)  Acute sinusitis symptoms for less than 10 days are not helped by antibiotics.Use warm moist compresses, and over the counter decongestants ( only as directed). Call if no improvement in 5-7 days, sooner if increasing pain, fever, or new symptoms. Prescriptions: CEFUROXIME AXETIL 500 MG TABS (CEFUROXIME AXETIL) One by mouth two times a day for 10 days  #  20 x 1   Entered and Authorized by:   Etta Grandchild MD   Signed by:   Etta Grandchild MD on 05/04/2010   Method used:   Print then Give to Patient   RxID:   [id number]

## 2010-12-28 NOTE — Letter (Signed)
Summary: Remote Device Check  [employer], Main Office  [address]   [place], [place] [address]   Phone: [phone]  Fax: [phone]     June 08, 2010 MRN: [id number]   [facility] [address] [place], [place]  [address]   Dear Mr. Foor,   Your remote transmission was recieved and reviewed by your physician.  All diagnostics were within normal limits for you.  __X___Your next transmission is scheduled for:  09-02-2010.  Please transmit at any time this day.  If you have a wireless device your transmission will be sent automatically.    Sincerely,  Vella Kohler

## 2010-12-28 NOTE — Progress Notes (Signed)
Summary: Call Report  Phone Note Other Incoming   Caller: [facility] Summary of Call: [facility] Triage Call Report Triage Record Num: [id number] Operator: Elita Boone Patient Name: [name] Call Date & Time: 10/22/2010 4:15:34PM Patient Phone: [phone] PCP: Illene Regulus Patient Gender: Male PCP Fax : [phone] Patient DOB: 1946/07/05 Practice Name: [facility] Reason for Call: Pt reports that he has bites on hands and at waist. Pt reports that he has had swelling both arm, tougue and lips.Pt reports that he has been using bendryl. Pt reports that right forarm and hand are a "little swollen". Home care advice given for bug bites. Call back parameters given. No emergent s/s. Protocol(s) Used: Bites and Stings - Insects or Spiders Recommended Outcome per Protocol: Provide Home/Self Care Reason for Outcome: Itchy bites Care Advice: Topical corticosteroid cream/gel may be used as directed on label or by pharmacist. DO NOT apply a dressing to the area.  ~ Call EMS 911 if any of the following occur within 24 hours of bite/sting: loss of consciousness, sudden onset of difficulty breathing or wheezing, chest pain or tightness, throat tightness, severe swelling of parts of the body (e.g., eyes, lips, or tongue) other than bite/sting site, abdominal cramps.  ~ If, more than 24 hours after the incident, the sting/bite site or area around sting/bite site becomes increasingly swollen, red or painful, or has a purulent or foul smelling discharge, or red streaks develop leading away from the site, call provider immediately.  ~ Apply cloth-covered ice pack or a cool compress to the area for no more than 20 minutes 4-8 times a day while awake to reduce pain and swelling.  ~  ~ SYMPTOM / CONDITION MANAGEMENT ITCHING RELIEF: - Avoid scratching; may cause further irritation and secondary infection - Take cool showers or baths to relieve itching - If cool water alone does  not relieve itching, try adding 1/2 to 1 cup baking soda or colloidal oatmeal (Aveeno) to bath water - Follow with application of a bland lotion suc Initial call taken by: Margaret Pyle, CMA,  October 26, 2010 9:06 AM

## 2010-12-28 NOTE — Assessment & Plan Note (Signed)
Summary: [name]   Visit Type:  1 yr f/u Referring Provider:  Madaline Guthrie   ( Psych) Primary Provider:  Jacques Navy MD  CC:  CAD.  History of Present Illness: The patient is seen for followup of coronary artery disease.  I've known him for many years.  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  He received an ICD in 2008. is not having any chest pain.  He has no signs of heart failure.  He was about to fall activities.  He's had a problem with contracture in his hand that was fixed surgically.  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 1 Once Daily 2)  Lipitor 80 Mg Tabs (Atorvastatin Calcium) .... Take 1 Tablet By Mouth Once A Day 3)  Carvedilol 25 Mg Tabs (Carvedilol) .... Take 1 Tablet By Mouth Two Times A Day 4)  Diovan 80 Mg Tabs (Valsartan) .... Take 1 Tablet By Mouth Once A Day 5)  Vyvanse 70 Mg Caps (Lisdexamfetamine Dimesylate) .... Take 2 Tablet By Mouth Every Morning 6)  Lexapro 20 Mg Tabs (Escitalopram Oxalate) .... Take 1 Tablet By Mouth Once A Day 7)  Aspirin 81 Mg  Tabs (Aspirin) .... Take 1 Tablet By Mouth Once A Day 8)  Multivitamins   Tabs (Multiple Vitamin) .... Take 1 Tablet By Mouth Once A Day 9)  Ocuvite Preservision  Tabs (Multiple Vitamins-Minerals) .Marland Kitchen.. 1 By Mouth Two Times A Day 10)  Buspirone Hcl 15 Mg Tabs (Buspirone Hcl) .Marland Kitchen.. 1 Tab Once Daily 11)  Elations Supplement .Marland KitchenMarland Kitchen. 1 8oz Drink Once Daily  Allergies (verified): No Known Drug Allergies  Past History:  Past Medical History: Last  updated: 12/30/2009 UCD ATTENTION DEFICIT HYPERACTIVITY DISORDER, ADULT (ICD-314.01) Hx of MALIGNANT NEOPLASM OF TONSIL (ICD-146.0) '05 CARDIOMYOPATHY, ISCHEMIC (ICD-414.8) EF ('09) 25% EF 25-30% ICD... 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 catheterization 2008... all grafts patent CABG... 1991  HYPERTENSION (ICD-401.9) HYPERLIPIDEMIA (ICD-272.4) GERD Left arm venous thrombosis  Review of Systems       Patient denies fever, chills, headache, sweats, rash, change in vision, change in hearing, chest pain, cough, shortness of breath, nausea vomiting, urinary symptoms.  All other systems are reviewed and are negative.  Vital Signs:  Patient profile:   65 year old male Height:      72 inches Weight:      199 pounds BMI:     27.09 Pulse rate:   72 / minute Pulse rhythm:   regular BP sitting:   120 / 80  (left arm) Cuff size:   large  Vitals Entered By: Danielle Rankin, CMA (December 30, 2009 3:17 PM)  Physical Exam  General:  patient is stable in general. Eyes:  no xanthelasma. Neck:  no jugular venous distention. Lungs:  lungs are clear.  Respiratory effort is nonlabored. Heart:  cardiac exam reveals S1-S2.  No clicks or significant murmurs. Abdomen:  abdomen is soft. Msk:  patient is wearing a  post surgical cast on his right hand area Extremities:  no peripheral edema. Skin:  no skin rashes. Psych:  patient is oriented to person time and place.  Affect is normal.    ICD Specifications ICD Vendor:  Medtronic     ICD Model Number:  7232     ICD Serial Number:  [id number] ICD DOI:  12/05/2006     ICD Implanting MD:  Lewayne Bunting, MD  Lead 1:    Location: RV     DOI: 12/05/2006     Model #: [id number]     Serial #: [id number]     Status: active  Indications::  ICM, VT   ICD Follow Up ICD Dependent:  No      Brady Parameters Mode VVI     Lower Rate Limit:  40        Tachy Zones VF:  194     VT:  250     VT1:  150     Impression & Recommendations:  Problem # 1:  * ICD patient's ICD is in place and it will be checked over the next few months.  Problem # 2:  ATTENTION DEFICIT HYPERACTIVITY DISORDER, ADULT (ICD-314.01) his ADD he is doing well.  He sees his medical doctor for this.  Problem # 3:  CARDIOMYOPATHY, ISCHEMIC (ICD-414.8)  The following medications were removed from the medication list:    Altace 10 Mg Caps (Ramipril)    Coreg 25 Mg Tabs (Carvedilol)    Lisinopril-hydrochlorothiazide 10-12.5 Mg Tabs (Lisinopril-hydrochlorothiazide) His updated medication list for this problem includes:    Carvedilol 25 Mg Tabs (Carvedilol) .Marland Kitchen... Take 1 tablet by mouth two times a day    Diovan 80 Mg Tabs (Valsartan) .Marland Kitchen... Take 1 tablet by mouth once a day    Aspirin 81 Mg Tabs (Aspirin) .Marland Kitchen... Take 1 tablet by mouth once a day  Orders: EKG w/ Interpretation (93000) EKG is done on January 26.  I have reviewed this and compared to his prior tracing.  He has old anteroseptal MI with old diffuse anterior T wave changes.  There is no change.  Patient is on appropriate medicine for his cardiomyopathy.  No change in therapy.  Problem # 4:  ESSENTIAL HYPERTENSION (ICD-401.9) blood pressure is under good control today.  No change in therapy.  Problem # 5:  HYPERLIPIDEMIA (ICD-272.4)  His updated medication list for this problem includes:    Lipitor 80 Mg Tabs (Atorvastatin calcium) .Marland Kitchen... Take 1 tablet by mouth once a day I reviewed the lipids with the patient.  He is getting a good response.  No change in therapy.    Patient Instructions: 1)  Your physician recommends that you schedule a follow-up appointment in: 12 months. The office will mail you a reminder card 2 months prior appointment date.

## 2010-12-28 NOTE — Cardiovascular Report (Signed)
Summary: Office Visit Remote   Office Visit Remote   Imported By: Roderic Ovens 12/23/2009 12:26:48  _____________________________________________________________________  External Attachment:    Type:   Image     Comment:   External Document

## 2010-12-28 NOTE — Miscellaneous (Signed)
  Clinical Lists Changes  Problems: Added new problem of * ICD Observations: Added new observation of REFERRING MD: Madaline Guthrie   ( Psych) (12/30/2009 12:16) Added new observation of PAST MED HX: UCD ATTENTION DEFICIT HYPERACTIVITY DISORDER, ADULT (ICD-314.01) Hx of MALIGNANT NEOPLASM OF TONSIL (ICD-146.0) '05 CARDIOMYOPATHY, ISCHEMIC (ICD-414.8) EF ('09) 25% EF 25-30% ICD... 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 catheterization 2008... all grafts patent CABG... 1991  HYPERTENSION (ICD-401.9) HYPERLIPIDEMIA (ICD-272.4) GERD Left arm venous thrombosis  (12/30/2009 12:16) Added new observation of PRIMARY MD: Jacques Navy MD (12/30/2009 12:16)       Past History:  Past Medical History: UCD ATTENTION DEFICIT HYPERACTIVITY DISORDER, ADULT (ICD-314.01) Hx of MALIGNANT NEOPLASM OF TONSIL (ICD-146.0) '05 CARDIOMYOPATHY, ISCHEMIC (ICD-414.8) EF ('09) 25% EF 25-30% ICD... 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 catheterization 2008... all grafts patent CABG... 1991  HYPERTENSION (ICD-401.9) HYPERLIPIDEMIA (ICD-272.4) GERD Left arm venous thrombosis

## 2010-12-28 NOTE — Letter (Signed)
Summary: Remote Device Check  [facility], Main Office  [address]   [place], [place] [address]   Phone: [phone]  Fax: [phone]     December 17, 2009 MRN: [id number]   [facility] [address] [place], [place]  [address]   Dear Mr. Dyck,   Your remote transmission was recieved and reviewed by your physician.  All diagnostics were within normal limits for you.    ___X___Your next office visit is scheduled for:     MARCH 2011 WITH DR [name]. Please call our office to schedule an appointment.    Sincerely,  Proofreader

## 2010-12-30 NOTE — Progress Notes (Signed)
Summary: set up - lab work  Phone Note Call from Patient Call back at [employer] [phone]   Caller: Patient Reason for Call: Talk to Nurse Summary of Call: pt would like to be set up for lab work prior to appt Initial call taken by: Lorne Skeens,  December 20, 2010 10:41 AM  Follow-up for Phone Call        Pt. would like to make an appointment for blood work. Pt. received a reminder letter on 04/28/10 for Cholesterol check. An appointment was made for  fasting lipid panel, LFT's for 12/21/10 at 9:30 AM. Pt. aware.  Follow-up by: Ollen Gross, RN, BSN,  December 20, 2010 11:26 AM

## 2010-12-30 NOTE — Letter (Signed)
Summary: Custom - Lipid  [facility], Main Office  [address]   [place], [place] [address]   Phone: [phone]  Fax: [phone]     December 24, 2010 MRN: [id number]   [name] [address] [place], [place]  [address]   Dear [name],  We have reviewed your cholesterol results.  They are as follows:     Total Cholesterol:    187 (Desirable: less than 200)       HDL  Cholesterol:     74.70  (Desirable: greater than 40 for men and 50 for women)       LDL Cholesterol:       91  (Desirable: less than 100 for low risk and less than 70 for moderate to high risk)       Triglycerides:       105.0  (Desirable: less than 150)  Our recommendations include:  Looks Good, your liver function was also ok, continue current medications.   Call our office at the number listed above if you have any questions.  Lowering your LDL cholesterol is important, but it is only one of a large number of "risk factors" that may indicate that you are at risk for heart disease, stroke or other complications of hardening of the arteries.  Other risk factors include:   A.  Cigarette Smoking* B.  High Blood Pressure* C.  Obesity* D.   Low HDL Cholesterol (see yours above)* E.   Diabetes Mellitus (higher risk if your is uncontrolled) F.  Family history of premature heart disease G.  Previous history of stroke or cardiovascular disease    *These are risk factors YOU HAVE CONTROL OVER.  For more information, visit .  There is now evidence that lowering the TOTAL CHOLESTEROL AND LDL CHOLESTEROL can reduce the risk of heart disease.  The American Heart Association recommends the following guidelines for the treatment of elevated cholesterol:  1.  If there is now current heart disease and less than two risk factors, TOTAL CHOLESTEROL should be less than 200 and LDL CHOLESTEROL should be less than 100. 2.  If there is current heart disease or two or more risk factors, TOTAL  CHOLESTEROL should be less than 200 and LDL CHOLESTEROL should be less than 70.  A diet low in cholesterol, saturated fat, and calories is the cornerstone of treatment for elevated cholesterol.  Cessation of smoking and exercise are also important in the management of elevated cholesterol and preventing vascular disease.  Studies have shown that 30 to 60 minutes of physical activity most days can help lower blood pressure, lower cholesterol, and keep your weight at a healthy level.  Drug therapy is used when cholesterol levels do not respond to therapeutic lifestyle changes (smoking cessation, diet, and exercise) and remains unacceptably high.  If medication is started, it is important to have you levels checked periodically to evaluate the need for further treatment options.  Thank you,    [facility] Team

## 2010-12-31 NOTE — Cardiovascular Report (Signed)
Summary: Office Visit Remote   Office Visit Remote   Imported By: Roderic Ovens 09/09/2010 11:23:03  _____________________________________________________________________  External Attachment:    Type:   Image     Comment:   External Document

## 2011-01-05 NOTE — Letter (Signed)
Summary: Remote Device Check  [facility], Main Office  [address]   [place], [place] [address]   Phone: [phone]  Fax: [phone]     December 26, 2010 MRN: [id number]   [name] [address] [place], [place]  [address]   Dear [name],   Your remote transmission was recieved and reviewed by your physician.  All diagnostics were within normal limits for you.  __X____Your next office visit is scheduled for:  April 2012 with Dr [name]. Please call our office to schedule an appointment.    Sincerely,  Vella Kohler

## 2011-01-05 NOTE — Cardiovascular Report (Signed)
Summary: Office Visit Remote   Office Visit Remote   Imported By: Roderic Ovens 12/27/2010 12:15:20  _____________________________________________________________________  External Attachment:    Type:   Image     Comment:   External Document

## 2011-01-10 ENCOUNTER — Ambulatory Visit (INDEPENDENT_AMBULATORY_CARE_PROVIDER_SITE_OTHER): Payer: BC Managed Care – PPO | Admitting: Cardiology

## 2011-01-10 ENCOUNTER — Encounter: Payer: Self-pay | Admitting: Cardiology

## 2011-01-10 DIAGNOSIS — I6529 Occlusion and stenosis of unspecified carotid artery: Secondary | ICD-10-CM

## 2011-01-10 DIAGNOSIS — I2510 Atherosclerotic heart disease of native coronary artery without angina pectoris: Secondary | ICD-10-CM

## 2011-01-12 ENCOUNTER — Encounter (INDEPENDENT_AMBULATORY_CARE_PROVIDER_SITE_OTHER): Payer: BC Managed Care – PPO

## 2011-01-12 ENCOUNTER — Encounter: Payer: Self-pay | Admitting: Cardiology

## 2011-01-12 DIAGNOSIS — I6529 Occlusion and stenosis of unspecified carotid artery: Secondary | ICD-10-CM

## 2011-01-12 DIAGNOSIS — R0989 Other specified symptoms and signs involving the circulatory and respiratory systems: Secondary | ICD-10-CM

## 2011-01-17 NOTE — Op Note (Signed)
NAMECHRISTOPHERJOHN, SCHIELE            ACCOUNT NO.:  [id number]  MEDICAL RECORD NO.:  [id number]          PATIENT TYPE:  AMB  LOCATION:  SDS                          FACILITY:  [facility]  PHYSICIAN:  Cindee Salt, M.D.       DATE OF BIRTH:  [birth year]  DATE OF PROCEDURE:  10/11/2010 DATE OF DISCHARGE:  10/11/2010                              OPERATIVE REPORT   PREOPERATIVE DIAGNOSIS:  Dupuytren contracture, left middle ring and little fingers.  POSTOPERATIVE DIAGNOSIS:  Dupuytren contracture, left middle ring and little fingers.  OPERATION:  Excision of palmar fascia with V-Y advancements, left middle ring and little fingers.  SURGEON:  Cindee Salt, MD  ASSISTANT:  Carolyne Fiscal, RN  ANESTHESIA:  General.  ANESTHESIOLOGIST:  Guadalupe Maple, MD  HISTORY:  The patient is a 65 year old male with a history of Dupuytren contracture middle ring and little fingers of his left hand.  This has not responded to conservative treatment and has led to progressive flexion deformities.  He is admitted now for fasciectomies with V-Y advancements as dictated by findings.  Pre, peri, and postoperative course have been discussed along with risks and complications.  He is aware that there is no guarantee with the surgery, possibility of infection, recurrence injury to arteries, nerves, tendons, incomplete relief of symptoms, dystrophy.  In the preoperative area, the patient is seen.  The extremity marked by both the patient and surgeon.  Antibiotic given.  PROCEDURE:  The patient was brought to the operating room.  A general anesthetic carried out without difficulty under the direction of Dr. Noreene Larsson.  He was prepped using DuraPrep, supine position, left arm free. A 3-minute dry time was allowed.  Time-out taken confirming the patient and procedure.  The limb was exsanguinated with an Esmarch bandage. Tourniquet placed on the upper arm was inflated to 250 mmHg.  A volar Bruner incision was made over the mid  palm, carried distally to the little finger, carried down through subcutaneous tissue.  Bleeders were electrocauterized with bipolar.  The dissection carried down to palmar fascia.  This was traced proximal to distal after identification of neurovascular bundles.  The dissection was carried out to the proximal phalanx and abductor digiti quinti cord was excised along with natatory ligament cords.  This allowed the finger to be fully straightened.  No further lesions were identified.  The cord to the ring finger was attended to next.  This was identified proximally.  Neurovascular bundles identified, traced distally.  This was done to the level of the A2 pulley.  This was released along with tissue and fascia into the skin.  The middle finger was attended to next.  Again neurovascular structures identified and protected and the dissection carried distally releasing and excising the cord to the middle finger.  The fascial attachment to the flexor retinaculum was released for the index.  The wound was copiously irrigated with saline, sprayed with thrombin spray. Vessel loop drains were placed in the depths of the wound and skin closed after converting the little finger, closed with interrupted 5-0 Vicryl Rapide sutures.  A sterile compressive dressing was applied.  On deflation of the  tourniquet, all fingers immediately pinked.  This was completed along with a dorsal splint for protection.  The patient tolerated the procedure well and was taken to the recovery room for observation in a satisfactory condition.  He will be discharged home to return to the [facility] in 1 week on Vicodin.          ______________________________ Cindee Salt, M.D.     GK/MEDQ  D:  10/11/2010  T:  10/12/2010  Job:  [id number]  Electronically Signed by Cindee Salt M.D. on 01/17/2011 12:23:19 PM

## 2011-01-19 NOTE — Assessment & Plan Note (Signed)
Summary: F1Y- GD   Visit Type:  Follow-up Referring Provider:  Madaline Guthrie   [phone]) Primary Provider:  Jacques Navy MD  CC:  CAD.  History of Present Illness: The patient is seen for followup of coronary disease, left ventricular dysfunction, ICD.  He does extremely well.  He has no signs of heart failure.  He has not had any recurrent chest pain.  He received his ICD in 2008.  This was checked last in April, 2011 and it is also checked at home.  Current Medications (verified): 1)  Nexium 40 Mg Cpdr (Esomeprazole Magnesium) .Marland Kitchen.. 1 Once Daily 2)  Lipitor 80 Mg Tabs (Atorvastatin Calcium) .... Take 1 Tablet By Mouth Once A Day 3)  Carvedilol 25 Mg Tabs (Carvedilol) .... 2 Tabs Once Daily 4)  Diovan 80 Mg Tabs (Valsartan) .... Take 1 Tablet By Mouth Once A Day 5)  Vyvanse 70 Mg Caps (Lisdexamfetamine Dimesylate) .... 2 Tabs Daily 6)  Lexapro 20 Mg Tabs (Escitalopram Oxalate) .... Take 1 Tablet By Mouth Once A Day 7)  Aspirin 81 Mg  Tabs (Aspirin) .... Take 1 Tablet By Mouth Once A Day 8)  Multivitamins   Tabs (Multiple Vitamin) .... Take 1 Tablet By Mouth Once A Day 9)  Ocuvite Preservision  Tabs (Multiple Vitamins-Minerals) .Marland Kitchen.. 1 By Mouth Two Times A Day 10)  Buspirone Hcl 10 Mg Tabs (Buspirone Hcl) .... 2 Tabs Daily 11)  Elations Supplement .Marland KitchenMarland Kitchen. 1 8oz Drink Once Daily 12)  Cetirizine Hcl 10 Mg Tabs (Cetirizine Hcl) .... Two Times A Day As Needed For Hives  Allergies (verified): No Known Drug Allergies  Past History:  Past Medical History: UCD ATTENTION DEFICIT HYPERACTIVITY DISORDER, ADULT (ICD-314.01) Hx of MALIGNANT NEOPLASM OF TONSIL (ICD-146.0) '05 CARDIOMYOPATHY, ISCHEMIC (ICD-414.8) EF ('09) 25% EF 25-30% ICD... 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 catheterization 2008... all grafts patent CABG... 1991  HYPERTENSION (ICD-401.9) HYPERLIPIDEMIA  (ICD-272.4) GERD Left arm venous thrombosis carotid bruit   left... February, 2012  Review of Systems       Patient denies fever, chills, headache, sweats, rash, change in vision, change in hearing, chest pain, cough, nausea vomiting, urinary symptoms.  All the systems are reviewed and are negative.  Vital Signs:  Patient profile:   65 year old male Weight:      209 pounds Pulse rate:   53 / minute BP sitting:   128 / 84  (left arm)  Vitals Entered By: Hardin Negus, RMA (January 10, 2011 9:55 AM)  Physical Exam  General:  patient is quite stable. Head:  head is atraumatic. Eyes:  no xanthelasma Neck:  there is left carotid bruit.  There is no jugular venous distention. Chest Wall:  no chest wall tenderness. Lungs:  lungs are clear.  Respiratory effort is nonlabored. Heart:  cardiac exam reveals an S1-S2.  No clicks or significant murmurs. Abdomen:  abdomen soft. Msk:  no musculoskeletal deformities. Extremities:  no peripheral edema. Skin:  no skin rashes. Psych:  patient is oriented to person time and place.  Affect is normal.    ICD Specifications Following MD:  Lewayne Bunting, MD     ICD Vendor:  [employer]     ICD Model Number:  7232     ICD Serial Number:  [id number] ICD DOI:  12/05/2006     ICD Implanting MD:  Lewayne Bunting, MD  Lead 1:    Location: RV  DOI: 12/05/2006     Model #: [id number]     Serial #: [id number]     Status: active  Indications::  ICM, VT   ICD Follow Up ICD Dependent:  No      Brady Parameters Mode VVI     Lower Rate Limit:  40      Tachy Zones VF:  194     VT:  250     VT1:  150     Impression & Recommendations:  Problem # 1:  CAROTID BRUIT, LEFT (ICD-785.9)  The patient has a left carotid bruit.  Carotid Dopplers have not been done in several years.  I do not have any records since his surgery many years ago.  Carotid Doppler will be done.  We'll be in touch with him with the information  Orders: Carotid Duplex (Carotid  Duplex)  Problem # 2:  AUTOMATIC IMPLANTABLE CARDIAC DEFIBRILLATOR SITU (ICD-V45.02) His ICD is in place and is followed by Dr. [name]  Problem # 3:  ATTENTION DEFICIT HYPERACTIVITY DISORDER, ADULT (ICD-314.01) The patient has been stable in this regard.  He is followed very carefully and is on medications for this.  Problem # 4:  CARDIOMYOPATHY, ISCHEMIC (ICD-414.8) the patient is on appropriate medications.  I have chosen not to make any changes.  Problem # 5:  CAD (ICD-414.00)  His updated medication list for this problem includes:    Carvedilol 25 Mg Tabs (Carvedilol) .Marland Kitchen... 2 tabs once daily    Aspirin 81 Mg Tabs (Aspirin) .Marland Kitchen... Take 1 tablet by mouth once a day  Orders: EKG w/ Interpretation (93000) Coronary disease is stable.  EKG is done today and reviewed by me.  He has an old septal infarct.  There are old diffuse T-wave changes.  This is unchanged when compared to prior tracing.  No further workup.  Problem # 6:  ESSENTIAL HYPERTENSION (ICD-401.9) Blood pressure is well-controlled.  No change in therapy.  Will see him back in one year for cardiology followup.  Problem # 7:  HYPERLIPIDEMIA (ICD-272.4)  His updated medication list for this problem includes:    Lipitor 80 Mg Tabs (Atorvastatin calcium) .Marland Kitchen... Take 1 tablet by mouth once a day The patient's last LDL was 90.  He is on maximum dose Lipitor.  He is encouraged to watch his diet but no change in medications.  Patient Instructions: 1)  Your physician has requested that you have a carotid duplex. This test is an ultrasound of the carotid arteries in your neck. It looks at blood flow through these arteries that supply the brain with blood. Allow one hour for this exam. There are no restrictions or special instructions. 2)  Follow up with Dr [name] in April 3)  Your physician wants you to follow-up in:  1 year.  You will receive a reminder letter in the mail two months in advance. If you don't receive a letter, please  call our office to schedule the follow-up appointment.

## 2011-02-08 ENCOUNTER — Other Ambulatory Visit: Payer: BC Managed Care – PPO

## 2011-02-08 ENCOUNTER — Encounter (INDEPENDENT_AMBULATORY_CARE_PROVIDER_SITE_OTHER): Payer: Self-pay | Admitting: *Deleted

## 2011-02-08 ENCOUNTER — Other Ambulatory Visit: Payer: Self-pay | Admitting: Internal Medicine

## 2011-02-08 DIAGNOSIS — Z0000 Encounter for general adult medical examination without abnormal findings: Secondary | ICD-10-CM

## 2011-02-08 DIAGNOSIS — Z0389 Encounter for observation for other suspected diseases and conditions ruled out: Secondary | ICD-10-CM

## 2011-02-08 LAB — BASIC METABOLIC PANEL
BUN: 19 mg/dL (ref 6–23)
Calcium: 9.1 mg/dL (ref 8.4–10.5)
Calcium: 8.9 mg/dL (ref 8.4–10.5)
Creatinine, Ser: 1.1 mg/dL (ref 0.4–1.5)
GFR calc Af Amer: >60 mL/min (ref >60)
GFR: 68.9 mL/min (ref >60.00)
Glucose, Bld: 135 mg/dL — ABNORMAL HIGH (ref 70–99)
Potassium: 4.3 mEq/L (ref 3.5–5.1)
Potassium: 4.7 mEq/L (ref 3.5–5.1)
Sodium: 139 mEq/L (ref 135–145)

## 2011-02-08 LAB — HEPATIC FUNCTION PANEL
AST: 27 U/L (ref 0–37)
Bilirubin, Direct: 0.1 mg/dL (ref 0.0–0.3)
Total Bilirubin: 0.8 mg/dL (ref 0.3–1.2)

## 2011-02-08 LAB — CBC WITH DIFFERENTIAL/PLATELET
Basophils Absolute: 0 10*3/uL (ref 0.0–0.1)
HCT: 39.1 % (ref 39.0–52.0)
Lymphs Abs: 1.2 10*3/uL (ref 0.7–4.0)
Monocytes Absolute: 0.4 10*3/uL (ref 0.1–1.0)
Monocytes Relative: 9.3 % (ref 3.0–12.0)
Neutrophils Relative %: 60.5 % (ref 43.0–77.0)
Platelets: 189 10*3/uL (ref 150.0–400.0)
RDW: 16.6 % — ABNORMAL HIGH (ref 11.5–14.6)

## 2011-02-08 LAB — LIPID PANEL
Cholesterol: 186 mg/dL (ref 0–200)
HDL: 69.3 mg/dL (ref >39.00)
VLDL: 32.6 mg/dL (ref 0.0–40.0)

## 2011-02-08 LAB — CBC
MCH: 27.2 pg (ref 26.0–34.0)
MCHC: 31.6 g/dL (ref 30.0–36.0)
MCV: 86.3 fL (ref 78.0–100.0)
Platelets: 194 10*3/uL (ref 150–400)
RDW: 15.3 % (ref 11.5–15.5)

## 2011-02-08 LAB — URINALYSIS
Hgb urine dipstick: NEGATIVE
Total Protein, Urine: NEGATIVE
Urine Glucose: NEGATIVE

## 2011-02-08 LAB — TSH: TSH: 1.63 u[IU]/mL (ref 0.35–5.50)

## 2011-02-13 LAB — BASIC METABOLIC PANEL
BUN: 16 mg/dL (ref 6–23)
CO2: 27 mEq/L (ref 19–32)
Chloride: 108 mEq/L (ref 96–112)
Glucose, Bld: 102 mg/dL — ABNORMAL HIGH (ref 70–99)
Potassium: 4.8 mEq/L (ref 3.5–5.1)

## 2011-02-13 LAB — CBC
HCT: 38 % — ABNORMAL LOW (ref 39.0–52.0)
MCV: 83.4 fL (ref 78.0–100.0)
Platelets: 203 10*3/uL (ref 150–400)
RDW: 16.1 % — ABNORMAL HIGH (ref 11.5–15.5)

## 2011-02-15 ENCOUNTER — Encounter: Payer: Self-pay | Admitting: Internal Medicine

## 2011-02-15 ENCOUNTER — Ambulatory Visit (INDEPENDENT_AMBULATORY_CARE_PROVIDER_SITE_OTHER): Payer: BC Managed Care – PPO | Admitting: Internal Medicine

## 2011-02-15 DIAGNOSIS — E785 Hyperlipidemia, unspecified: Secondary | ICD-10-CM

## 2011-02-15 DIAGNOSIS — I2589 Other forms of chronic ischemic heart disease: Secondary | ICD-10-CM

## 2011-02-15 DIAGNOSIS — I10 Essential (primary) hypertension: Secondary | ICD-10-CM

## 2011-02-15 DIAGNOSIS — C099 Malignant neoplasm of tonsil, unspecified: Secondary | ICD-10-CM

## 2011-02-15 DIAGNOSIS — Z95810 Presence of automatic (implantable) cardiac defibrillator: Secondary | ICD-10-CM

## 2011-02-15 DIAGNOSIS — R972 Elevated prostate specific antigen [PSA]: Secondary | ICD-10-CM

## 2011-02-15 HISTORY — DX: Elevated prostate specific antigen (PSA): R97.20

## 2011-02-15 NOTE — Progress Notes (Signed)
Subjective:    Patient ID: [name], male    DOB: [birth year], 65 y.o.   MRN: [id number]  HPI  [name] presents for a routine physical exam. IN the interval he has been doing well. He has kept up with Drs. [name] in regard to AICD. The device has not fired and as far as he knows he is good. He has had no major illness. He has had surgery for dupytren's contractures both hands (Dr. Merlyn [name]) . Generally he has been doing well.     Review of Systems  Constitutional: Negative.   HENT: Negative.   Eyes: Negative.   Respiratory: Negative.   Cardiovascular: Negative.   Gastrointestinal: Negative.   Genitourinary: Negative.  Negative for urgency and testicular pain.  Musculoskeletal: Negative.   Neurological: Negative.   Hematological: Negative.   Psychiatric/Behavioral: Negative.      Past Medical History  Diagnosis Date  . Adult ADHD   . Malignant neoplasm of tonsil   . 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 Esophageal stricture   . Dyspepsia   . CAD (coronary artery disease)     Cath 2008, all grafts patent, CABG 1991  . HTN (hypertension)   . Hyperlipidemia   . GERD (gastroesophageal reflux disease)   . Thrombosis of arm     Left  . Carotid bruit 12/2010    Left  . Elevated PSA 02/15/2011     Family History  Problem Relation Age of Onset  . Alzheimer's disease Mother   . Dementia Mother     living in [place]  . Hyperlipidemia Father   . Hypertension Father   . Heart disease Father     CAD, CHF, had pcemaker  . Heart disease Brother     Bypass surgery  . Colon cancer Neg Hx   . Prostate cancer Neg Hx   . Diabetes Neg Hx   . Breast cancer Sister     survivor   History   Social History  . Marital Status: Married    Spouse Name: N/A    Number of Children: 0  . Years of Education: N/A   Occupational History  . Retired     Sport and exercise psychologist for [employer]   Social  History Main Topics  . Smoking status: Never Smoker   . Smokeless tobacco: Not on file  . Alcohol Use: 14.4 oz/week    24 Cans of beer per week  . Drug Use: No  . Sexually Active: Yes    Birth Control/ Protection: None   Other Topics Concern  . Not on file   Social History Narrative   [employer] - 4 yearsMarried '75No childrenMarriage in good health, wife in good health      Objective:   Physical Exam  Constitutional: He is oriented to person, place, and time. He appears well-developed and well-nourished.  HENT:  Head: Normocephalic and atraumatic.  Right Ear: External ear normal.  Left Ear: External ear normal.  Mouth/Throat: Oropharynx is clear and moist.       Bony deformations on inner lower gum  Eyes: Conjunctivae and EOM are normal. Pupils are equal, round, and reactive to light.  Neck: Normal range of motion. Neck supple. No thyromegaly present.  Cardiovascular: Normal rate, regular rhythm and normal heart sounds.   Pulmonary/Chest: Effort normal and breath sounds normal.       AICD device upper outer  left chest wall  Abdominal: Soft. Bowel sounds are normal. He exhibits no mass. There is no tenderness. There is no guarding.  Musculoskeletal: Normal range of motion.  Neurological: He is alert and oriented to person, place, and time. He has normal reflexes. No cranial nerve deficit. Coordination normal.  Skin: Skin is warm and dry. No rash noted.  Psychiatric: He has a normal mood and affect. His behavior is normal. Judgment and thought content normal.          Assessment & Plan:  MALIGNANT NEOPLASM OF TONSIL Stable with no residual problems.  HYPERLIPIDEMIA Well controlled with LDL at goal. No problems with medication side effects  ESSENTIAL HYPERTENSION Good control at home with mild elevation in the office today.  Plan - will continue present medications and continue home monitoring.  CARDIOMYOPATHY, ISCHEMIC Stable with no limitations in activity.  Followed closely by Dr. Myrtis [name]  Plan - continue present medications              AUTOMATIC IMPLANTABLE CARDIAC DEFIBRILLATOR SITU Current with Dr. Ladona [name]. He has had no firings. Will keep schedule EP appointmen  Elevated PSA Patient with elevated PSA highly suggestive of prostate cancer.  Plan - refer to Dr. Vernie [name] for further evaluation

## 2011-02-15 NOTE — Assessment & Plan Note (Signed)
Stable with no limitations in activity. Followed closely by Dr. Myrtis Ser  Plan - continue present medications

## 2011-02-15 NOTE — Assessment & Plan Note (Signed)
Current with Dr. [name]. He has had no firings. Will keep schedule EP appointmen

## 2011-02-15 NOTE — Assessment & Plan Note (Signed)
Good control at home with mild elevation in the office today.  Plan - will continue present medications and continue home monitoring.

## 2011-02-15 NOTE — Assessment & Plan Note (Signed)
Stable with no residual problems.

## 2011-02-15 NOTE — Assessment & Plan Note (Signed)
Patient with elevated PSA highly suggestive of prostate cancer.  Plan - refer to Dr. Vernie Ammons for further evaluation

## 2011-02-15 NOTE — Assessment & Plan Note (Signed)
Well controlled with LDL at goal. No problems with medication side effects

## 2011-02-21 ENCOUNTER — Other Ambulatory Visit: Payer: Self-pay | Admitting: Internal Medicine

## 2011-02-24 ENCOUNTER — Encounter: Payer: Self-pay | Admitting: Cardiology

## 2011-03-09 ENCOUNTER — Other Ambulatory Visit (HOSPITAL_COMMUNITY): Payer: Self-pay | Admitting: Urology

## 2011-03-09 DIAGNOSIS — C61 Malignant neoplasm of prostate: Secondary | ICD-10-CM

## 2011-03-17 ENCOUNTER — Ambulatory Visit (INDEPENDENT_AMBULATORY_CARE_PROVIDER_SITE_OTHER): Payer: BC Managed Care – PPO | Admitting: Internal Medicine

## 2011-03-17 ENCOUNTER — Encounter: Payer: Self-pay | Admitting: Internal Medicine

## 2011-03-17 ENCOUNTER — Other Ambulatory Visit: Payer: Self-pay | Admitting: *Deleted

## 2011-03-17 VITALS — BP 126/80 | HR 60 | Ht 72.0 in | Wt 205.4 lb

## 2011-03-17 DIAGNOSIS — I472 Ventricular tachycardia: Secondary | ICD-10-CM

## 2011-03-17 DIAGNOSIS — I428 Other cardiomyopathies: Secondary | ICD-10-CM

## 2011-03-17 DIAGNOSIS — I2589 Other forms of chronic ischemic heart disease: Secondary | ICD-10-CM

## 2011-03-17 DIAGNOSIS — Z95810 Presence of automatic (implantable) cardiac defibrillator: Secondary | ICD-10-CM

## 2011-03-17 DIAGNOSIS — I5022 Chronic systolic (congestive) heart failure: Secondary | ICD-10-CM

## 2011-03-17 MED ORDER — VALSARTAN 80 MG PO TABS: 80.0000 mg | ORAL_TABLET | Freq: Every day | ORAL | Status: DC

## 2011-03-17 MED ORDER — CARVEDILOL PHOSPHATE ER 80 MG PO CP24: 80.0000 mg | ORAL_CAPSULE | Freq: Every day | ORAL | Status: DC

## 2011-03-17 MED ORDER — CARVEDILOL 25 MG PO TABS: 50.0000 mg | ORAL_TABLET | Freq: Two times a day (BID) | ORAL | Status: DC

## 2011-03-17 NOTE — Patient Instructions (Signed)
Your physician wants you to follow-up in:  12 months.  You will receive a reminder letter in the mail two months in advance. If you don't receive a letter, please call our office to schedule the follow-up appointment.   

## 2011-03-17 NOTE — Assessment & Plan Note (Signed)
He has had no recurrent arrhythmias. We'll continue his current medications.

## 2011-03-17 NOTE — Progress Notes (Signed)
HPI  No Known Allergies   Current Outpatient Prescriptions  Medication Sig Dispense Refill  . aspirin 81 MG EC tablet Take 81 mg by mouth daily.        Marland Kitchen atorvastatin (LIPITOR) 80 MG tablet Take 80 mg by mouth daily.        . busPIRone (BUSPAR) 10 MG tablet Take 20 mg by mouth daily.       Marland Kitchen escitalopram (LEXAPRO) 20 MG tablet Take 20 mg by mouth daily.        Marland Kitchen lisdexamfetamine (VYVANSE) 70 MG capsule Take 140 mg by mouth every morning.        . Multiple Vitamins-Minerals (MULTIVITAMIN,TX-MINERALS) tablet Take 1 tablet by mouth daily.        . Multiple Vitamins-Minerals (OCUVITE PRESERVISION) TABS Take by mouth 2 (two) times daily.        Marland Kitchen NEXIUM 40 MG capsule TAKE 1 CAPSULE BY MOUTH EVERY DAY  90 capsule  2  . NON FORMULARY Joint Juice: 8 oz daily       . DISCONTD: carvedilol (COREG) 25 MG tablet Take 50 mg by mouth 2 (two) times daily.        Marland Kitchen DISCONTD: valsartan (DIOVAN) 80 MG tablet Take 80 mg by mouth daily.        . carvedilol (COREG CR) 80 MG 24 hr capsule Take 1 capsule (80 mg total) by mouth daily.  30 capsule  11  . carvedilol (COREG) 25 MG tablet Take 2 tablets (50 mg total) by mouth 2 (two) times daily.  120 tablet  6  . valsartan (DIOVAN) 80 MG tablet Take 1 tablet (80 mg total) by mouth daily.  30 tablet  6  . valsartan (DIOVAN) 80 MG tablet Take 1 tablet (80 mg total) by mouth daily.  30 tablet  11  . DISCONTD: cetirizine (ZYRTEC) 10 MG tablet Take 10 mg by mouth 2 (two) times daily as needed. For hives          Past Medical History  Diagnosis Date  . Adult ADHD   . Malignant neoplasm of tonsil   . 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 Esophageal stricture   . Dyspepsia   . CAD (coronary artery disease)     Cath 2008, all grafts patent, CABG 1991  . HTN (hypertension)   . Hyperlipidemia   . GERD (gastroesophageal reflux disease)   . Thrombosis of arm     Left  . Carotid bruit 12/2010    Left  . Elevated PSA 02/15/2011    ROS:   All systems reviewed and negative except as noted in the HPI.   Past Surgical History  Procedure Date  . Coronary artery bypass graft     LIMA_LAD, SVG-OM, Cx, RCA '91  . Rotator cuff repair 2005  . Tonsillectomy 2005    Malignant lesion, w/XRT  . Cystectomy     Hand  . Icd placement   . Thumb surgery   . Hand contracture release     bilateral release of Dupyrten's contracture: '10 and '11     Family History  Problem Relation Age of Onset  . Alzheimer's disease Mother   . Dementia Mother     living in [place]  . Hyperlipidemia Father   . Hypertension Father   . Heart disease Father     CAD, CHF, had pcemaker  . Heart disease Brother  Bypass surgery  . Colon cancer Neg Hx   . Prostate cancer Neg Hx   . Diabetes Neg Hx   . Breast cancer Sister     survivor     History   Social History  . Marital Status: Married    Spouse Name: N/A    Number of Children: 0  . Years of Education: N/A   Occupational History  . 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 Smoking status: Never Smoker   . Smokeless tobacco: Not on file  . Alcohol Use: 14.4 oz/week    24 Cans of beer per week  . Drug Use: No  . Sexually Active: Yes    Birth Control/ Protection: None   Other Topics Concern  . Not on file   Social History Narrative   [employer] - 4 yearsMarried '75No childrenMarriage in good health, wife in good health     BP 126/80  Pulse 60  Ht 6' (1.829 m)  Wt 205 lb 6.4 oz (93.169 kg)  BMI 27.86 kg/m2  Physical Exam:  Well appearing NAD HEENT: Unremarkable Neck:  No JVD, no thyromegally Lymphatics:  No adenopathy Back:  No CVA tenderness Lungs:  Clear HEART:  Regular rate rhythm, no murmurs, no rubs, no clicks Abd:  Flat, positive bowel sounds, no organomegally, no rebound, no guarding Ext:  2 plus pulses, no edema, no cyanosis, no clubbing Skin:  No rashes no nodules Neuro:   CN II through XII intact, motor grossly intact  EKG  DEVICE  Normal device function.  See PaceArt for details.   Assess/Plan:

## 2011-03-17 NOTE — Assessment & Plan Note (Signed)
He denies anginal symptoms. We'll continue his current medications. I have asked him to continue his current activity level and increase his walking.

## 2011-03-17 NOTE — Assessment & Plan Note (Signed)
His device is working normally. We'll recheck in several months. 

## 2011-03-21 ENCOUNTER — Ambulatory Visit (HOSPITAL_COMMUNITY): Payer: BC Managed Care – PPO

## 2011-03-21 ENCOUNTER — Encounter (HOSPITAL_COMMUNITY): Payer: BC Managed Care – PPO

## 2011-03-22 ENCOUNTER — Encounter: Payer: Self-pay | Admitting: Cardiology

## 2011-03-23 ENCOUNTER — Encounter (HOSPITAL_COMMUNITY)
Admission: RE | Admit: 2011-03-23 | Discharge: 2011-03-23 | Disposition: A | Discharge status: Home / Self Care | Payer: BC Managed Care – PPO | Source: Ambulatory Visit | Attending: Urology | Admitting: Urology

## 2011-03-23 DIAGNOSIS — C61 Malignant neoplasm of prostate: Secondary | ICD-10-CM

## 2011-03-23 MED ORDER — TECHNETIUM TC 99M MEDRONATE IV KIT
25.0000 | PACK | Freq: Once | INTRAVENOUS | Status: AC | PRN
  Administered 2011-03-23: 25 via INTRAVENOUS

## 2011-03-28 ENCOUNTER — Encounter: Payer: Self-pay | Admitting: Internal Medicine

## 2011-03-31 ENCOUNTER — Other Ambulatory Visit: Payer: Self-pay | Admitting: *Deleted

## 2011-03-31 DIAGNOSIS — I5022 Chronic systolic (congestive) heart failure: Secondary | ICD-10-CM

## 2011-03-31 MED ORDER — VALSARTAN 80 MG PO TABS: 80.0000 mg | ORAL_TABLET | Freq: Every day | ORAL | Status: DC

## 2011-04-12 NOTE — Assessment & Plan Note (Signed)
[name]                         ELECTROPHYSIOLOGY OFFICE NOTE   [name]                   MRN:          [id number]  DATE:02/17/2009                            DOB:          20-Nov-1946    [name] returns today for followup.  He is a very pleasant middle-  aged male with an ischemic cardiomyopathy, congestive heart failure, and  ventricular tachycardia.  The patient has dyslipidemia and  gastroesophageal reflux disease as well.  Since I saw him last, he has  had no intercurrent ICD therapies.  He denies chest pain.  He denies  shortness of breath.  He returns today for followup.  Please see his  most recent medication list.  He is on Nexium, aspirin, Lipitor,  Lexapro, Coreg 25 twice a day, Diovan 80 a day, Vyvanse, Centrum Silver,  and eyedrops.   PHYSICAL EXAMINATION:  GENERAL:  He is a pleasant well-appearing middle-  aged man in no acute distress.  VITAL SIGNS:  Blood pressure was 141/83, the pulse was 69 and regular,  the respirations were 18, and the weight was 190 pounds. NECK:  No  jugular venous distention.  LUNGS:  Clear bilaterally to auscultation.  No wheezes, rales, or  rhonchi are present.  There is no increased work of breathing.  CARDIOVASCULAR:  Regular rate and rhythm.  Normal S1 and S2.  ABDOMEN:  Soft, nontender.  EXTREMITIES:  No edema.   Interrogation of his defibrillator demonstrates a Medtronic Maximo.  R-  waves of 15, the impedance 528, the threshold is 1 volt at 0.2.  Battery  voltage was 3.13 volts.  Underlying rhythm was sinus at 62.  There were  3 nonsustained episodes of VT.   IMPRESSION:  1. Ischemic cardiomyopathy.  2. Ventricular tachycardia.  3. Congestive heart failure.  4. Dyslipidemia.   DISCUSSION:  [name]'s VT is stable as is his heart failure.  He  has no anginal symptoms.  His defibrillator is working normally.  We  will plan to see him back in the office in 1 year.  I  will continue his  current medical therapy.  He is instructed to call us if he receives any  intercurrent ICD shocks.     [name]. Peter Ridgel, MD  Electronically Signed    [name]/[name]  DD: 02/17/2009  DT: 02/18/2009  Job #: [id number]

## 2011-04-12 NOTE — Assessment & Plan Note (Signed)
[facility]                            CARDIOLOGY OFFICE NOTE   [name]                   MRN:          [id number]  DATE:01/14/2009                            DOB:          1946-06-14    [name] is here for followup.  He is doing well.  He has known  coronary disease.  He is not having any chest pain.  I saw him last in  February 2008.  He had an ICD placed in 2008.  He had a problem with  some venous thrombosis in the arm, which was resolved eventually with  Coumadin therapy and he is stable in this regard.  He has not had an ICD  discharge.  He has no chest pain.  He has no shortness of breath.  He is  going about full activities.   ALLERGIES:  No known drug allergies.   MEDICATIONS:  1. Nexium.  2. Aspirin.  3. Lipitor 80.  4. Lexapro.  5. Coreg 25 b.i.d.  6. Diovan 80.  7. Vyvanse.  8. Centrum Silver.  9. Eye drops.   OTHER MEDICAL PROBLEMS:  See the list below.   REVIEW OF SYSTEMS:  He is not having any fevers or chills.  He has no  headaches.  He is not having any GI or GU symptoms.  There are no dental  problems.  He has no rashes.  His review of systems is negative.   PHYSICAL EXAMINATION:  VITAL SIGNS:  Blood pressure is 136/70 with a  pulse of 66.  GENERAL:  The patient is oriented to person, time, and place.  Affect is  normal.  HEENT:  No xanthelasma.  He has normal extraocular motions.  NECK:  There are no carotid bruits.  There is no jugular venous  distention.  LUNGS:  Clear.  Respiratory effort is not labored.  CARDIAC:  Reveals an S1 with an S2.  There are no clicks or significant  murmurs.  ABDOMEN:  Soft.  EXTREMITIES:  He has no peripheral edema.   EKG reveals interventricular conduction delay.  There are old ST  changes.  There are PVCs.  There is no significant change.   PROBLEMS:  1. Attention deficit disorder.  Over time his meds have been      stabilized and he actually is doing  quite well.  2. Hyperlipidemia, treated.  His LDL of 100.  He is on 80 of Lipitor.      I talked to him about his diet.  His HDL is 70.  I will not change      his medicines.  I have talked to him about working harder on his      diet.  3. Hypertension, stable.  4. EF in the 25-30% range.  He is on appropriate treatment.  5. History of cancer of the tonsils.  This was treated with radiation      in the past and thyroid has to be watched.  6. Gastroesophageal reflux disease.  7. History of esophageal stricture.  8. Coronary disease.  He is post CABG in 1991.  He underwent  cardiac      catheterization in January 2008 with three-vessel native disease.      All his grafts were patent and there was no indication for      intervention.  9. Episode of sustained monomorphic ventricular tachycardia.  He has      an ICD in place now and he is quite stable.  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   [name] is stable.  We will help him with his prescriptions.  I  will see him back in 1 year.  I have not changed his medicines.     Peter Abed, MD, [facility]  Electronically Signed    JDK/MedQ  DD: 01/14/2009  DT: 01/14/2009  Job #: [id number]   cc:   Rosalyn Gess. Norins, MD  Psychiatry Dr. Madaline Guthrie

## 2011-04-12 NOTE — Op Note (Signed)
NAMEJOHNNATHAN, [name]            ACCOUNT NO.:  [id number]   MEDICAL RECORD NO.:  [id number]          PATIENT TYPE:  AMB   LOCATION:  SDS                          FACILITY:  [facility]   PHYSICIAN:  Lubertha Basque. Dalldorf, M.D.DATE OF BIRTH:  February 03, 1946   DATE OF PROCEDURE:  10/21/2008  DATE OF DISCHARGE:  10/21/2008                               OPERATIVE REPORT   PREOPERATIVE DIAGNOSES:  1. Left knee torn medial meniscus.  2. Left knee chondromalacia.   POSTOPERATIVE DIAGNOSES:  1. Left knee torn medial and torn lateral meniscus.  2. Left knee chondromalacia.   PROCEDURES:  1. Left knee partial medial and partial lateral meniscectomies.  2. Left knee abrasion chondroplasty, patellofemoral.   ANESTHESIA:  Knee block MAC.   ATTENDING SURGEON:  Lubertha Basque. Jerl Santos, MD   ASSISTANT:  Lindwood Qua, PA   INDICATIONS:  The patient is a 65 year old man with a long history of a  swollen, painful left knee.  He has persisted with difficulty despite  medication and intra-articular injection.  By x-ray, he has good  maintenance of the joint space.  I hope that at this point we can  perform an arthroscopic assessment of his situation and address problems  at the same setting.  He cannot have scans due to his pacemaker.  We  elected to proceed with arthroscopy and an informed operative consent  was obtained after discussion of the possible complications of reaction  to anesthesia and infection.   SUMMARY, FINDINGS, AND PROCEDURE:  Under knee block and MAC, an  arthroscopy of the left knee was performed.  Suprapatellar pouch was  benign, although patellofemoral joint exhibited some grade 4 change  across the intertrochlear groove.  A thorough chondroplasty was done  along with abrasion to bleeding bone in some tiny areas.  In the medial  compartment, he had a degenerative tear of the posterior horn of the  medial meniscus, addressed with a 5% partial meniscectomy done with  basket and shaver.   He had some grade 4 change focally in this  compartment as well with most changes grade 2 and 3.  ACL was intact.  Lateral compartment showed no evidence of any articular cartilage  damage, but he did have another meniscus tear in this compartment.  About a 10% partial lateral meniscectomy was done.   DESCRIPTION OF PROCEDURE:  The patient was taken to the operating suite  where knee block and MAC were applied.  He was positioned supine,  prepped and draped in normal sterile fashion.  After the administration  of IV Kefzol, an arthroscopy of the left knee was performed through a  total of 2 portals.  Findings were as noted above and procedure  consisted of a partial medial and partial lateral meniscectomies done  with basket and shavers.  We then performed the abrasion chondroplasty  patellofemoral.  Also, we performed the chondroplasty in the medial  compartment.  His knee was thoroughly irrigated followed by placement of  the usual agents plus Depo-Medrol 80 mg.  Adaptic was placed over the  portals followed by dry gauze and a loose Ace wrap.  Estimated  blood  loss and intraoperative fluids obtained from anesthesia records.   DISPOSITION:  The patient was taken to the recovery room in stable  condition.  Plans were for him to go home the same-day pending  anesthesia approval.  If he does end up going home, I will contact him  by phone tonight.      Lubertha Basque Jerl Santos, M.D.  Electronically Signed     PGD/MEDQ  D:  10/21/2008  T:  10/22/2008  Job:  [id number]

## 2011-04-12 NOTE — Assessment & Plan Note (Signed)
[facility]                         ELECTROPHYSIOLOGY OFFICE NOTE   [name], [name]                   MRN:          [id number]  DATE:01/01/2008                            DOB:          January 04, 1946    [name] returns today for follow-up.  He is a very pleasant middle-  aged man with ischemic cardiomyopathy, class I heart failure and  ventricular tachycardia.  He also has hypertension.  He returns today  for follow-up.  He denies chest pain or shortness of breath.  He had one  episode of dizziness back in December which correlates with an episode  of VT for which he received anti-tachycardic pacing therapy.  Otherwise  he had no specific complaints today.  He has had no chest pain or  shortness of breath recently.  He denies peripheral edema.   MEDICATIONS:  1. Nexium 40 a day.  2. Enteric coated aspirin 81 a day.  3. Lipitor 80 a day.  4. Lexapro 20 a day.  5. Coreg 25 twice daily.  6. Diovan 80 mg daily.  7. Lisinopril/HCTZ 10/12.5 daily.   PHYSICAL EXAMINATION:  He is a pleasant well-appearing middle-aged man  in no distress.  Blood pressure was 149/90, the pulse 63 and regular, respirations were  18.  The weight was 206 pounds.  NECK:  Revealed no jugular distention.  LUNGS:  Clear bilaterally to auscultation. No wheezes, rales or rhonchi.  CARDIOVASCULAR:  Regular rate and rhythm with normal S1 and S2.  EXTREMITIES:  Demonstrated no edema.   Interrogation of the defibrillator demonstrates a Medtronic Maximo. R  waves were 16, the impedance 576 and threshold was a volt at 0.2.  Battery voltage was 3.17 volts.  There was one episode of VT which was  successfully terminated with anti-tachycardic pacing therapy.   IMPRESSION:  1. Ischemic cardiomyopathy.  2. Ventricular tachycardia.  3. Status post ICD insertion.   DISCUSSION:  Overall [name] is stable and his defibrillator is  working normally.  Will plan to see him  back in the office in 1 year for  ICD follow-up.  He will continue his current medications.    Doylene Canning. [name], MD  Electronically Signed   GWT/MedQ  DD: 01/01/2008  DT: 01/01/2008  Job #: [id number]

## 2011-04-15 NOTE — Op Note (Signed)
NAMEKOUA, DEEG            ACCOUNT NO.:  [id number]   MEDICAL RECORD NO.:  [id number]          PATIENT TYPE:  OIB   LOCATION:  2922                         FACILITY:  [facility]   PHYSICIAN:  Doylene Canning. [name], MD    DATE OF BIRTH:  [birth year]   DATE OF PROCEDURE:  12/05/2006  DATE OF DISCHARGE:                               OPERATIVE REPORT   PROCEDURE PERFORMED:  Implantation of single chamber implantable  cardioverter-defibrillator.   INDICATIONS:  Sustained monomorphic ventricular tachycardia in the  setting of ischemic cardiomyopathy, EF 25%.   INTRODUCTION:  The patient is a 65 year old male with a longstanding  ischemic cardiomyopathy who is status post myocardial infarction in the  distant past and status post bypass surgery in the past.  I initially  saw him back in June 2007 and that time recommended prophylactic ICD  implantation but the patient refused.  He was in his usual state of  health until this morning when he developed substernal chest pain and  palpitations and presyncope.  He called 911 and in the ambulance he was  found to be in sustained monomorphic VT.  He was treated with lidocaine  and amiodarone which resulted in termination of his VT.  He underwent  emergent catheterization demonstrating three-vessel coronary disease and  patent grafts.  His EF is 20-25%.  He is now referred for ICD  implantation.   PROCEDURE:  After informed consent was obtained, patient is taken  diagnostic EP lab in fasting state.  After the usual preparation and  draping, 30 mL lidocaine was infiltrated in the left infraclavicular  region.  A 7 cm incision was carried out over this region.  Electrocautery utilized to dissect down to the fascial plane.  10 mL of  contrast injected into the left upper extremity, demonstrated a patent  left subclavian vein.  Subsequently punctured and the Medtronic model  6947 65-cm active fixation defibrillation lead serial number [id number]  was  advanced into the right ventricle.  Mapping was carried out at the  final site on the RV apical septum and the R-waves measured 25 mV and  the pacing impedance was 975 ohms, threshold 0.6 volts, 0.5 milliseconds  and 10 volt pacing did not stimulate the diaphragm.  With these  satisfactory parameters lead was secured to subpectoralis fascia with a  figure-of-eight silk suture.  The sewing sleeve was also secured with  silk suture.  Electrocautery utilized to make subcutaneous pocket.  Kanamycin irrigation was utilized to irrigate the pocket.  Electrocautery utilized to assure hemostasis.  The Medtronic Maximo  model 7232 single chamber defibrillator serial number [id number] was  connected to the defibrillation lead and placed in the subcutaneous  pocket and secured with silk suture.  This point additional kanamycin  was utilized to irrigate the pocket.  Defibrillation threshold testing  carried out.   After the patient was more deeply sedated with fentanyl and Versed, VF  was induced with T-wave shock and 15 joules shock was delivered which  terminated VF and resulted in ventricular tachycardia.  25 joules shock  was then delivered terminating VT restoring sinus rhythm.  This point no  additional defibrillation threshold testing was carried out.  The  incision closed with layer of 2-0 Vicryl followed by layer of 3-0 Vicryl  followed by layer of 4-0 Vicryl.  Benzoin painted on the skin, Steri-  Strips were applied and pressure dressing was placed and the patient  returned to his room in satisfactory condition.   COMPLICATIONS:  There were no immediate procedure complications.   RESULTS:  Demonstrate successful implantation of a Medtronic single  chamber defibrillator a patient with ischemic cardiomyopathy and  sustained monomorphic ventricular tachycardia.      Doylene Canning. [name], MD  Electronically Signed     GWT/MEDQ  D:  12/05/2006  T:  12/06/2006  Job:  [id number]   cc:   [name], MD, [facility]  Dr. Phillip Heal

## 2011-04-15 NOTE — Op Note (Signed)
[place]  Patient:    [name], [name] Visit Number: [id number] MRN: [id number]          Service Type: DSU Location: [facility] Attending Physician:  Ronne Binning Dictated by:   Nicki Reaper, M.D. Proc. Date: 03/14/02 Admit Date:  03/14/2002   CC:         (2)   Operative Report  PREOPERATIVE DIAGNOSIS: Mucoid cyst, interphalangeal joint left thumb.  POSTOPERATIVE DIAGNOSIS: Mucoid cyst, interphalangeal joint left thumb.  OPERATION/PROCEDURE:  1. Excision of mucoid cyst, left thumb.  2. Debridement of distal interphalangeal joint, left thumb.  SURGEON: Nicki Reaper, M.D.  ASSISTANT: Joaquin Courts, R.N.  ANESTHESIA: Forearm-based IV regional.  ANESTHESIOLOGIST: Maren Beach, M.D.  HISTORY: The patient is a 65 year old male, with a history of a mass on the dorsal aspect just distal to the IP joint of his thumb, with deformity to the nail.  PROCEDURE: The patient was brought to the operating room, where forearm-based IV regional anesthetic was carried out without difficulty.  He was prepped and draped using Betadine scrubbing solution with the left arm free.  A curvilinear incision was made over the IP joint, carried down through subcutaneous tissue.  Bleeders were electrocauterized, the dissection carried down, the skin lifted distally.  The cystic structure was immediately apparent.  With blunt and sharp dissection this was dissected free, taking care to protect the nail matrix.  The cyst was sent to pathology.  This was followed back into the radial aspect of the joint.  This area was opened and exostosis was present on the proximal phalanx.  This was removed with a rongeur.  The joint was debrided.  Significant synovitis was present.  No further lesions were identified.  The wound was irrigated.  The skin was closed with interrupted 5-0 nylon suture.  A sterile compressive dressing and splint were applied to the finger.  The  patient tolerated the procedure well and was taken to the recovery room for observation in satisfactory condition. He is discharged home to return to the [facility] [place] in one week, on Vicodin and Keflex. Dictated by:   Nicki Reaper, M.D. Attending Physician:  Ronne Binning DD:  03/14/02 TD:  03/14/02 Job: [id number]

## 2011-04-15 NOTE — Op Note (Signed)
NAME:  [name], [name]                      ACCOUNT NO.:  [id number]   MEDICAL RECORD NO.:  [id number]                   PATIENT TYPE:  OIB   LOCATION:  2872                                 FACILITY:  [facility]   PHYSICIAN:  Lubertha Basque. Jerl Santos, M.D.             DATE OF BIRTH:  1946/08/21   DATE OF PROCEDURE:  05/20/2004  DATE OF DISCHARGE:  05/20/2004                                 OPERATIVE REPORT   PREOPERATIVE DIAGNOSIS:  1. Right shoulder impingement.  2. Right shoulder acromioclavicular degeneration.  3. Right shoulder rotator cuff tear.   POSTOPERATIVE DIAGNOSIS:  1. Right shoulder impingement.  2. Right shoulder acromioclavicular degeneration.  3. Right shoulder rotator cuff tear.   PROCEDURE:  1. Right shoulder arthroscopic acromioplasty.  2. Right shoulder arthroscopic acromioclavicular resection.  3. Left shoulder arthroscopic rotator cuff repair.   ANESTHESIA:  General and block.   SURGEON:  Lubertha Basque. Jerl Santos, M.D.   ASSISTANT:  Lindwood Qua, P.A.   INDICATIONS FOR PROCEDURE:  The patient is a 65 year old man with years of  right shoulder pain.  This has become worse recently.  It has responded to  subacromial injection in the past x 2-3 but at this point, gets very little  relief from that intervention.  He has been active with an exercise program.  He has had an MRI scan which shows a small rotator cuff tear along with cuff  degeneration, impingement, and AC arthropathy.  He has pain at rest and pain  with activities which is limiting to him and he is offered an arthroscopy.  Informed operative consent was obtained after discussing the possible  complications of reaction to anesthesia and infection.   DESCRIPTION OF PROCEDURE:  The patient was taken to the operating room suite  where general anesthetic was applied without difficulty.  He was positioned  in the beach chair position and prepped and draped in normal sterile  fashion.  After administration of  preoperative IV antibiotics, an  arthroscopy of the right shoulder was performed through a total of four  portals.  The glenohumeral joint showed no degenerative change and the  biceps tendon did have about 10% tear which I debrided.  He had an obvious  full thickness rotator cuff tear at the supraspinatus attachment site which  was minimal retracted.  In the subacromial space, he had a grade 2 bursitis  which I addressed with a thorough bursectomy.  We performed an acromioplasty  changing the morphology of his acromion from type 3 to type 1.  This was  done with a bur in the lateral position followed by transfer of the bur to  the posterior position.  He also had bone on bone degeneration of the [facility]  joint and a formal ACD compression was done with the bur in the anterior  portal.  The cuff was examined and a large tear was seen at the  supraspinatus attachment site.  The under surface  was debrided back to good  tissues and a bur was used to create a bleeding bed of bone on the humeral  head in the area of our repair.  The cuff was placed in appropriate position  and then stabilized with two of the parachute anchors by [employer].  This  seemed to repair the cuff very well to the attachment site and it was  thoroughly examined thereafter.  The shoulder was irrigated followed by  reapproximation of the portals with nylon.  Adaptic was placed over the  wounds followed by dry gauze and tape.  Estimated blood loss and  interoperative fluids can be obtained from anesthesia records.   DISPOSITION:  The patient was extubated in the operating room and taken to  the recovery room in stable condition.  The plans were for him to go home  the same day and follow up in the office next week.  I will contact him by  phone tonight.                                               Lubertha Basque Jerl Santos, M.D.    PGD/MEDQ  D:  05/20/2004  T:  05/21/2004  Job:  [id number]

## 2011-04-15 NOTE — H&P (Signed)
NAMEYONAS, BUNDA            ACCOUNT NO.:  [id number]   MEDICAL RECORD NO.:  [id number]          PATIENT TYPE:  OIB   LOCATION:  2807                         FACILITY:  [facility]   PHYSICIAN:  Jahfari Ambers. Excell Seltzer, MD  DATE OF BIRTH:  1946-10-16   DATE OF ADMISSION:  12/05/2006  DATE OF DISCHARGE:                              HISTORY & PHYSICAL   PRIMARY CARE PHYSICIAN:  Devondre Guzzetta. Norins, MD   PRIMARY CARDIOLOGIST:  [name], MD   CHIEF COMPLAINT:  Chest pain.   HISTORY OF PRESENT ILLNESS:  Mr. Nabozny is a 65 year old male with  known coronary artery disease.  He had bypass surgery in 1991 and last  saw Dr. Myrtis Ser in February 2007.  This a.m. he was in his usual state of  health and had sudden onset of chest pain at approximately 8 a.m..  He  called EMS and upon arrival he was in ventricular tachycardia.  He had a  pulse and a blood pressure with this so he was given IV amiodarone and  lidocaine.  He converted to sinus rhythm.  After he converted to sinus  rhythm., his chest pain resolved.  He was continued on lidocaine and  brought urgently to the catheterization lab.  He is currently pain-free.  With the chest pain Mr. Swing had pallor, diaphoresis, and shortness  of breath.  This is similar to symptoms he has had in the past, but he  has not had any chest pain recently.   PAST MEDICAL HISTORY:  1. Status post aortocoronary bypass surgery in 1991 with LIMA to LAD,      SVG to OM and circumflex, and SVG to RCA.  2. Ischemic cardiomyopathy with an EF of 26% by echocardiogram in      December 2006.  3. Attention deficit disorder.  4. Hypertension.  5. History of cancer of the tonsils.  6. Hyperlipidemia.  7. Gastroesophageal reflux disease.  8. History of esophageal stricture.  9. Granulare annulare.   PAST SURGICAL HISTORY:  1. Cardiac catheterization.  2. Bypass surgery.  3. Right shoulder arthroscopy October 04, 2006.  4. Right shoulder surgery in 2005.  5.  Left tonsillar excision for carcinoma February 2005.  6. Left thumb surgery.  7. EGD and dilatation.   ALLERGIES:  No known drug allergies.   CURRENT MEDICATIONS:  1. Lexapro 10 mg a day.  2. Lipitor unknown dose daily.  3. Aspirin 325 mg (taken today but does not take regularly).   SOCIAL HISTORY:  He lives in [place] with his wife.  He is retired.  He quit smoking when he was 65 years old.  He drinks about a six-pack a  day but denies drug use.   FAMILY HISTORY:  His mother is living.  His father died of complications  from an MI at age 64, and he has one brother who has had bypass surgery.   REVIEW OF SYSTEMS:  He was diaphoretic today.  Occasionally he has  problems with anxiety.  He denies any GU symptoms.  He denies any  hematemesis, hemoptysis or melena.  He is not currently taking  anything  for reflux.  The chest pain is described above.  He has chronic  arthralgias and pain secondary to his shoulder.  Review of systems is  otherwise negative.   PHYSICAL EXAMINATION:  GENERAL:  He is a well-developed, well-nourished  white male in moderate distress.  HEENT:  Head is normocephalic and atraumatic with extraocular movements  intact.  Sclera are clear.  Nares without discharge.  NECK:  There is no lymphadenopathy, thyromegaly or bruits noted.  CARDIOVASCULAR:  His heart is regular in rate and rhythm with an S1, S2,  and no significant murmur, rub or gallop is noted.  His distal pulses  are 2+ in all four extremities.  LUNGS:  Essentially clear to auscultation bilaterally.  SKIN:  No rashes or lesions are noted.  ABDOMEN:  Soft and with active bowel sounds.  EXTREMITIES:  There is no cyanosis, clubbing or edema noted.  NEUROLOGIC:  He is alert and oriented.  His cranial nerves II-XII are  grossly intact.  MUSCULOSKELETAL:  There is no joint deformity or effusions noted.   Chest x-ray is pending at the time of dictation.   Labs are pending at the time of dictation.    EKG is sinus rhythm with no acute ST elevation.  Initial rhythm strip is  ventricular tachycardia.   IMPRESSION:  Chest pain.  The patient is being taken urgently to the  catheterization lab.  Further evaluation and treatment will depend on  the results of cardiac catheterization.  Will check a lipid profile and  he is enrolled in the CHAMPION study, so a hemoglobin A1c will be drawn  as well.  The patient states he has not had any problems  with withdrawal from alcohol in the past.  He will be continued on the  Lexapro and Lipitor 80 mg, and we will emphasize compliance with aspirin  and other medications.   Mr. Kingbird was examined by Dr. Excell Seltzer, who determined the plan of  care.      Theodore Demark, PA-C      Veverly Fells. Excell Seltzer, MD  Electronically Signed    RB/MEDQ  D:  12/05/2006  T:  12/05/2006  Job:  [id number]   cc:   Rosalyn Gess. Norins, MD  Dr. Madaline Guthrie

## 2011-04-15 NOTE — Consult Note (Signed)
[name], [name]            ACCOUNT NO.:  [id number]   MEDICAL RECORD NO.:  [id number]          PATIENT TYPE:  OIB   LOCATION:  2922                         FACILITY:  [facility]   PHYSICIAN:  Doylene Canning. [name], MD    DATE OF BIRTH:  Jul 13, 1946   DATE OF CONSULTATION:  12/05/2006  DATE OF DISCHARGE:                                 CONSULTATION   ALLERGIES:  No known drug allergies.   This dictation is with the supervision and approval and after Dr. Sharrell Ku, Electrophysiologist, had seen, counseled, and examined the  patient.   ELECTROPHYSIOLOGIST:  Doylene Canning. [name], MD.   PRIMARY CARDIOLOGIST:  [name], MD, [facility].   PRIMARY CARE PHYSICIAN:  [name]. Norins, MD.   [name] is a 65 year old male with known coronary artery  disease who presents to the emergency room by way of emergency medical  services. He had sudden onset of heart racing while watching television.  This soon devolved to chest pain, diaphoresis, radiation of pain to left  arm and left jaw and some dyspnea.  He also felt lightheaded   The patient has a history of myocardial infarction in 1985 treated with  angioplasty.  This primary procedure of angioplasty was followed in 1991  with recurrent chest pain.  He had left heart catheterization and  subsequent coronary artery bypass graft surgery. Grafts placed during  that procedure in 1991 were a LIMA to the LAD; SVG to the OM; SVG to the  right coronary artery.  The patient is familiar with chest discomfort  symptoms and, at the time of his chest pain this morning, called 9-1-1.  In transit with emergency medical services, the patient was found to  have sustained hemodynamically significant monomorphic ventricular  tachycardia by telemetry strips. He was placed on IV amiodarone, IV  lidocaine, and spontaneous medical conversion to sinus rhythm.  He did  not require DC cardioversion   The patient was taken to the catheterization lab  emergently on arrival  at [facility]. Study shows that all three bypass graft  surgeries placed in 1991 are patent, ejection fraction 25%.  He does  have severe three-vessel native disease with all main vessels, the LAD,  the left circumflex and right coronary artery proximally occluded with  patent grafts. It is suspected that his ventricular arrhythmia was a  primary event   The patient had previously been seen in consultation by Dr. [name] in  June 2007.  At that time, he qualify for prophylactic implantation of  cardioverter defibrillator by the MADIT II criteria. The patient  declined implantation at that time.  Among his concerns were that he  needed right shoulder surgery, and MRI imaging was pending.  Right  shoulder surgeries are now behind him x2. He was seen in consultation in  the catheterization lab holding area by Dr. [name], who examined the  electrocardiographic strips showing monomorphic ventricular tachycardia  at a rate of 230 beats per minute.  He counseled the patient that his  risk of recurrent ventricular tachycardia is high, and the risk of  sudden cardiac death  attached to this malignant dysrhythmia is also  high.   CURRENT MEDICATIONS:  1. Enteric-coated aspirin 325 mg daily.  2. Lipitor 80 mg at bedtime.  3. Lexapro 10 mg daily.  4. Coreg 6.25 mg twice daily.  5. Protonix 40 mg daily.   SOCIAL HISTORY:  The patient lives in [place] with his wife.  He is  retired from [employer].  He is fairly active, does yard work and  also Engineering geologist.  He does not smoke.  He will drink alcoholic  beverages about a six-pack per day.   FAMILY HISTORY:  His mother is living. His father died of myocardial  infarction at age 68. He has one brother who is status post coronary  artery bypass graft surgery.   REVIEW OF SYSTEMS:  The patient not having fevers or chills.  He had  sweats and diaphoresis with the onset of tachy palpitations and chest   pain.  HEENT:  No nasal discharge.  No epistaxis.  No hoarseness.  No  vertigo.  No photophobia.  INTEGUMENT: No rashes.  No nonhealing  ulcerations to the lower extremities. CARDIOPULMONARY:  Chest pain,  shortness of breath, presyncopal symptoms and palpitation, all exhibited  during onset of monomorphic ventricular tachycardia this morning.  UROGENITAL:  No nocturia.  No frequency, urgency or dysuria.  NEUROPSYCHIATRIC: No weakness, numbness, depression, anxiety.  GASTROINTESTINAL:  He has GERD.  No history of GI bleeding.  ENDOCRINE:  No history of diabetes or thyroid dysfunction, although his admission  fasting glucose is 128, and a hemoglobin A1c is pending.  MUSCULOSKELETAL:  As mentioned above, he has right shoulder rotator cuff  tear with repair and subsequent completion of surgery for removal of  debris in the right shoulder. He is not having any joint swelling or  acute decompensation of the right shoulder at this time.  All other  systems are negative.   PHYSICAL EXAMINATION:  VITAL SIGNS:  Temperature 97.8, pulse 63 and  regular, blood pressure 159/103, respirations 20.  Weight is 200.  GENERAL:  He is alert, oriented x3 in no acute distress after a left  heart catheterization.  HEENT:  Normocephalic, atraumatic.  Eyes:  Pupils equal, round, reactive  to light.  Extraocular movements are intact.  Sclerae are anicteric.  Nares without discharge.  NECK:  Supple without carotid bruits auscultated.  No thyromegaly.  No  jugular venous distension.  HEART:  Regular rate and rhythm, no S3 or S4, without murmur.  LUNGS:  Clear to auscultation bilaterally.  ABDOMEN: Soft, nondistended.  Bowel sounds are present.  No rebound or  guarding.  No hepatosplenomegaly.  Abdominal aorta is not palpated.  EXTREMITIES: Show no evidence of lower extremity edema. Dorsalis pedis  pulses are 4/4 bilaterally.  Radial pulses 4/4 bilaterally. MUSCULOSKELETAL:  No joint deformities, effusions,  scoliosis  NEUROLOGIC:  Alert and oriented x3 as mentioned above.  Cranial nerves  II-XII grossly intact.  Grip strength in both upper extremities is 5/5.   Electrocardiogram strip shows monomorphic ventricular tachycardia, rate  230. After conversion, the strip shows sinus rhythm, rate of 78; QRS is  120.   Laboratory studies:  PT 12.5, INR 0.9, PTT 26. Complete blood count:  White cells 5.5, hemoglobin 13.9, hematocrit 41.9, platelets 227.  Serum  electrolytes: Sodium 140, potassium 3.9, chloride 107, carbonate 23, BUN  10, creatinine 0.416, glucose 128, alkaline phosphatase 75, SGOT 26,  SGPT 28. Hemoglobin A1c is pending.  Study taken at 9:45 on January 8:  CK 88, CK-MB 1.8, troponin I study 0.02.   PRINCIPAL DIAGNOSES:  1. Sustained monomorphic ventricular tachycardia.  2. Ischemic cardiomyopathy, ejection fraction 25%.  3. Class I-II congestive heart failure, chronic systolic.  4. Severe three-vessel native coronary artery disease with all grafts      patent at left heart catheterization December 05, 2006.   SECONDARY DIAGNOSES.:  1. History of myocardial infarction in 1985 status post angioplasty.  2. Recurrent chest pain with left heart catheterization in 1991,      subsequent coronary artery bypass graft surgery x3.  3. Attention deficit disorder.  4. Hypertension.  5. Dyslipidemia.  6. Gastroesophageal reflux disease.  7. History of esophageal stricture status post dilatation.  8. Status post resection of tonsillar cancer February 2005.  9. Right rotator cuff tear status post surgery in 2005 with      arthroscopic to remove debris in November 2007.  10.A strong family history of coronary artery disease.   PLAN:  The patient has been told the risks and benefits, goals and  expectations of implantation of cardioverter defibrillator.  The patient  wishes to proceed. Question of antiarrhythmic therapy will arise at some  time after implantation of cardioverter  defibrillator.  The patient may  discharge on antiarrhythmic therapy.      Maple Mirza, PA      Doylene Canning. [name], MD  Electronically Signed    GM/MEDQ  D:  12/05/2006  T:  12/05/2006  Job:  [id number]

## 2011-04-15 NOTE — H&P (Signed)
[name], [name]            ACCOUNT NO.:  [id number]   MEDICAL RECORD NO.:  [id number]          PATIENT TYPE:  INP   LOCATION:  1845                         FACILITY:  [facility]   PHYSICIAN:  Arturo Morton. Riley Kill, MD, FACCDATE OF BIRTH:  29-May-1946   DATE OF ADMISSION:  12/12/2006  DATE OF DISCHARGE:                              HISTORY & PHYSICAL   PRIMARY CARE PHYSICIAN:  Dr. Debby Bud.   PRIMARY CARDIOLOGIST:  Dr. Willa Rough.   ELECTROPHYSIOLOGIST:  Dr. Lewayne Bunting.   CHIEF COMPLAINT:  Left arm pain.   HISTORY OF PRESENT ILLNESS:  [name] is a 65 year old male with a  history of an admission from December 05, 2006, through December 07, 2006,  for chest pain that was caused by ventricular tachycardia for which he  received a Medtronic ICD.  He has done well since discharge on December 07, 2006, but on December 11, 2006, he had onset of left arm pain and  edema.  His symptoms worsened, and he noticed a dusky color to his arm.  He called the office and was told to come to the emergency room.  He has  had no chest pain, no palpation, no shortness of breath, and his ICD has  not fired.  He has been compliant with his home medications.  He was  told not to move his arm for a week and has had it in a sling the whole  time.  Currently, he is having some pain in the antecubital area and he  says it feels tight, but has no other complaints.   PAST MEDICAL HISTORY:  1. Status post aortocoronary bypass surgery in 1991 with LIMA to LAD,      SVG to OM and circumflex, SVG to RCA.  2. Ischemic cardiomyopathy with an EF of 25-35% on echo in December      2006, EF of 25% by cardiac catheterization January 2008.  3. Status post cardiac catheterization, December 05, 2006, with severe      native 3-vessel disease and patent grafts.  4. Ventricular tachycardia status post Medtronic ICD.  5. Hypertension.  6. Hyperlipidemia.  7. Attention deficit disorder.  8. History of tonsillar cancer.  9.  Granular annulare.  10.Gastroesophageal reflux disease.  11.Esophageal stricture.   SURGICAL HISTORY:  He is status post cardiac catheterization, as well as  bypass surgery and ICD, as well as EGD with dilatation, right shoulder  surgery x2, tonsillar surgery, and left thumb surgery.   ALLERGIES:  No known drug allergies.   MEDICATIONS:  1. Aspirin 81 mg a day.  2. Lexapro 20 mg daily.  3. Lipitor 80 mg daily.  4. Coreg 25 mg daily.  5. Nexium 40 mg daily.  6. Wellbutrin 300 mg daily.  7. Altace 10 mg daily.  8. One A Day vitamin daily.   SOCIAL HISTORY:  Lives in [place] with his wife and is retired from  [employer].  He has not smoked since he was a teenager.  He drinks about  6 beers a day, and denies drug abuse.   FAMILY HISTORY:  His mother is alive at  age 16 with no history of  coronary artery disease.  His father died at age 46 from complications  from an MI.  He has 1 brother who has had bypass surgery and is living.   REVIEW OF SYSTEMS:  He has some chronic dyspnea on exertion, which has  not changed recently.  He denies edema, except for his left arm.  There  are no palpitations, presyncope or syncope.  He is not coughing or  wheezing.  His weight has not changed recently.  He is having some pain  in his left upper extremity, but denies other arthralgias.  His reflux  symptoms are well controlled, and he denies melena, hematemesis or  hemoptysis.   Dictation ended at this point.      Theodore Demark, PA-C      Arturo Morton. Riley Kill, MD, [facility]  Electronically Signed    RB/MEDQ  D:  12/12/2006  T:  12/13/2006  Job:  [id number]

## 2011-04-15 NOTE — Discharge Summary (Signed)
[name], [name]            ACCOUNT NO.:  [id number]   MEDICAL RECORD NO.:  [id number]          PATIENT TYPE:  INP   LOCATION:  2626                         FACILITY:  [facility]   PHYSICIAN:  UNKNOWN PHYSICIAN      DATE OF BIRTH:  1946/04/11   DATE OF ADMISSION:  12/12/2006  DATE OF DISCHARGE:  12/15/2006                               DISCHARGE SUMMARY   This patient has no known drug allergies.   PRINCIPAL DIAGNOSES:  1. Admitted December 12, 2006 with left arm pain and swelling with      finding of a left upper extremity deep venous thrombosis.      a.     Started on intravenous heparin and Coumadin on admission.   SECONDARY DIAGNOSES:  1. Ischemic cardiomyopathy, ejection fraction 25% at left heart      catheterization December 05, 2006.      a.     A history of myocardial infarction.      b.     A history of three-vessel coronary artery disease status       post coronary artery bypass graft surgery in 1991.      c.     Admitted with sustained monomorphic ventricular tachycardia       December 05, 2006.      d.     Status post implantable cardioverter-defibrillator implant       with Medtronic Maximo Model 7232, single-chamber cardioverter       defibrillator December 05, 2006.  2. Hypertension, with increased systolic blood pressures this      admission.  On medical therapy, they were running 147,  142, 164,      176, 158, 141, and 146.  3. Dyslipidemia.  4. A history of tonsillar carcinoma.  5. Gastroesophageal reflux disease/esophageal stricture.   No procedures this admission but the patient was started on  anticoagulant for DVT.   BRIEF HISTORY:  [name] is a 65 year old male.  He had a recent  admission with sustained monomorphic ventricular tachycardia and chest  pain.  He has a history of three-vessel coronary artery disease and  underwent emergent catheterization on  January 8 which found all grafts  were patent and he had subsequent implantation of Medtronic  single-  chamber cardioverter defibrillator on the same day.  The patient is  presenting approximately 7 days after the procedure with sudden  discovery that his left arm was taut, swollen, and painful.   The patient called the office of [facility] and was told to come  to the emergency room.  The patient does not have any chest pain or  palpitations.  His cardioverter-defibrillator has not discharged.  He is  not short of breath.  He is compliant with his medications and he has  had his arm in a sling for about 1 week.   HOSPITAL COURSE:  The patient presented with left upper extremity DVT  status post implantation of cardioverter defibrillator on January 15 he  was started immediately on IV heparin and oral Coumadin.  His swelling  has greatly decreased.  He has been keeping  his arm left arm elevated  throughout this hospitalization.  His protime at the time of discharge  on January 18 is 19.8, INR is 1.6.  He will be given one dose of  subcutaneous Lovenox prior to discharge and his Coumadin dose has been  adjusted as will be discussed subsequently.  Throughout this  hospitalization, his blood pressure has been elevated somewhat,  certainly above 140 throughout and as high as 176 systolic.  He is on a  maximum amount of Altace of 10 mg.  He is on Coreg 12.5 mg twice daily  but his heart rates have been somewhat bradycardiac throughout this  hospitalization and perhaps increasing the Coreg at this time is not an  option the patient has left ventricular dysfunction with ejection  fraction 25% which would make the calcium channel blocker not a good  candidate.  I am recommending to monitor his systolic blood pressure  perhaps add a mild diuretic blood pressure medication such as  hydrochlorothiazide.  The patient is not currently on any diuretic  therapy.  If hydrochlorothiazide were used, it would be best to  complement that with oral doses of potassium.  The patient  discharged on  January 18 on the following medications:  1. Coumadin, a new medication, 10 mg to be taken on the 18th and then      7.5 mg daily starting Saturday, January 19.  2. Enteric-coated aspirin 81 mg daily.  3. Lexapro 20 mg daily.  4. Lipitor 80 mg daily at bedtime.  5. Coreg 12.5 mg twice daily.  6. Nexium 40 mg daily.  7. Wellbutrin 300 mg daily.  8. Altace 10 mg daily.  9. Multivitamin daily.   He has followup at [facility] [address]  1. Coumadin Clinic Monday, January 21 at 8:30.  2. Wound check January 23 at 9:20 in the morning, possibly this could      be checked at his Coumadin Clinic meeting on Monday, January 21.  3. He will see Dr. Myrtis Ser February 11 at 10:15 in the morning, possibly      he will receive increased antihypertensive medication.  4. To see Dr. [name] on April 22 at 9:20 in the morning.   LABORATORY STUDIES PERTINENT TO THIS ADMISSION:  On the day of  discharge, January 18:  A complete blood count:  Hemoglobin 12.6,  hematocrit 37.3, white cells 6.6, platelets 197.  Serum electrolytes  this admission:  Sodium 136, potassium is 4, chloride 103, bicarbonate  24, BUN is 13, creatinine 0.95, glucose is 96.  On the day of discharge,  the protime was 19.8, INR is 1.6, his alkaline phosphatase this  admission was 83, SGOT was 19, SGPT is 21.      Maple Mirza, PA    ______________________________  Crissie Figures PHYSICIAN    GM/MEDQ  D:  12/15/2006  T:  12/15/2006  Job:  [id number]   cc:   Rosalyn Gess. Norins, MD  [name], MD, [facility]  Doylene Canning. [name], MD

## 2011-04-15 NOTE — Op Note (Signed)
[name], VANGIESON            ACCOUNT NO.:  [id number]   MEDICAL RECORD NO.:  [id number]          PATIENT TYPE:  AMB   LOCATION:  SDS                          FACILITY:  [facility]   PHYSICIAN:  Lubertha Basque. Dalldorf, M.D.DATE OF BIRTH:  1946-10-23   DATE OF PROCEDURE:  10/03/2006  DATE OF DISCHARGE:                                 OPERATIVE REPORT   PREOPERATIVE DIAGNOSES:  1. Right shoulder loose body.  2. Right shoulder impingement.   POSTOPERATIVE DIAGNOSES:  1. Right shoulder loose body.  2. Right shoulder impingement.  3. Right shoulder biceps degeneration.   PROCEDURE:  1. Right shoulder arthroscopic removal of loose bodies.  2. Right shoulder arthroscopic acromioplasty.  3. Right shoulder arthroscopic biceps tenotomy.   ANESTHESIA:  General and block.   SURGEON:  Lubertha Basque. Jerl Santos, M.D.   ASSISTANT:  Lindwood Qua, P.A.   INDICATIONS FOR PROCEDURE:  The patient is a 65 year old man who is about  two and one-half years out from a rotator cuff repair.  He did well until a  few months ago when he developed a locking sensation in his shoulder, along  with some sharp pains.  He has undergone a repeat MRI scan which was of poor  quality due to the metallic implants about the shoulder.  This did not show  any evidence of recurrent rotator cuff tear.  Nevertheless, he persisted  with the mechanical symptoms in the shoulder and was  offered an  arthroscopy.  An informed operative consent was obtained after a discussion  of the possible complications of, reaction to anesthesia and infection.   FINDINGS:  Under general anesthesia and a block, a right shoulder  arthroscopy was performed.  He did have two loose bodies in the shoulder,  which appeared to be anchor material from the parachute device used to  repair his rotator cuff.  We removed two portions and then found this anchor  and one-third of it remained still attached.  This was subsequently removed  to prevent any  future loose bodies.  He also had severe degeneration of the  biceps tendon, addressed with a tenotomy at the level of the glenoid.  He  had some degenerative change about the humeral head, grade 2 or 3 at most,  in some focal areas.  This was addressed with a thorough chondroplasty.  In  the subacromial space the rotator cuff was thoroughly examined and no  significant tear could be seen.  He had a slightly prominent subacromial  morphology, addressed with a revision acromioplasty.  Bryna Colander, P.A.  assisted throughout and was invaluable to the completion of the case in that  he helped position and held instruments while I performed the procedure.   DESCRIPTION OF PROCEDURE:  The patient was taken to the operating suite  where general anesthetic was applied without  difficulty.  He was also given  a block in the pre-anesthesia area.  He was taken to the operating room and  positioned in the beach chair position, and prepped and draped in the normal  sterile fashion.  After the administration of IV Kefzol, an arthroscopy of  the right shoulder was performed through a total of three portals.  Findings  were as noted above.  The procedure consisted of the removal of the loose  bodies, followed by a biceps tenotomy.  In the subacromial space, an  acromioplasty was done with the bur in the lateral position, followed by  transfer over to the posterior position.  Again the cuff was thoroughly  examined and no tear could be seen.  At the end of the case the scope was re-  introduced into the glenohumeral joint, and no additional loose bodies could  be found.  The shoulder was thoroughly irrigated.  Simple sutures of nylon  were used to loosely reapproximate the portals, followed by Adaptic and a  dry gauze dressing with tape.  The estimated blood loss and fluids can be  obtained from the anesthesia records.   DISPOSITION:  The patient was extubated in the operating room and taken to  the  recovery room in stable addition.  Plans were for him to stay overnight  on telemetry for monitoring, with probable discharge home in the morning.      Lubertha Basque Jerl Santos, M.D.  Electronically Signed     PGD/MEDQ  D:  10/03/2006  T:  10/03/2006  Job:  [id number]

## 2011-04-15 NOTE — Op Note (Signed)
NAME:  [name], [name]                      ACCOUNT NO.:  [id number]   MEDICAL RECORD NO.:  [id number]                   PATIENT TYPE:  AMB   LOCATION:  DSC                                  FACILITY:  [facility]   PHYSICIAN:  Kristine Garbe. Ezzard Standing, M.D.         DATE OF BIRTH:  04-27-1946   DATE OF PROCEDURE:  01/09/2004  DATE OF DISCHARGE:                                 OPERATIVE REPORT   PREOPERATIVE DIAGNOSIS:  Left tonsil mass.   POSTOPERATIVE DIAGNOSIS:  Left tonsil mass.   OPERATION:  Direct laryngoscopy. Excision of left tonsil.   SURGEON:  Kristine Garbe. Ezzard Standing, M.D.   ANESTHESIA:  General.   COMPLICATIONS:  None.   INDICATIONS FOR PROCEDURE:  [name] is a 65 year old gentleman  who  has had a sore throat since December. On examination  he has a significantly  enlarged left tonsil. He is taken to the operating room at this time for  excisional biopsy of the left tonsil.   DESCRIPTION OF PROCEDURE:  After adequate endotracheal anesthesia, first a  direct laryngoscopy was performed. The base of the tongue, vallecula and  epiglottis were all normal to evaluation. The  vocal cords were clear. Both  piriform sinuses were clear. The AE folds were all normal. The  hypopharyngeal and laryngeal structures were normal to evaluation.   Next a mouth gag was used to expose the oropharynx. [name] had a  significantly enlarged firm left tonsil, consistent with probable tumor. The  tonsil was excised from the left tonsillar fossa. The mass did not invade  the musculature but was adherent.   The tonsil was totally excised without grossly crossing the tumor.  Hemostasis was achieved with electrocautery. The right tonsil appeared  normal to evaluation and nothing was done on the right side.   After obtaining adequate hemostasis, the patient was awakened from  anesthesia and transferred to the recovery room postoperatively doing well.  Of note [name] received 12 mg of  Decadron IV preoperatively as well as 1 gm  of Ancef IV preoperatively.   DISPOSITION:  [name] is discharged to home later this morning on Tylenol  and Lortab elixir 50 to 30 mL q.4h. p.r.n. pain, amoxicillin suspension 600  mg b.i.d. x1 week. I will have him follow up in my office in one week for  recheck and review pathology.                                               Kristine Garbe. Ezzard Standing, M.D.    CEN/MEDQ  D:  01/09/2004  T:  01/09/2004  Job:  [id number]   cc:   Rosalyn Gess. Norins, M.D. [facility]

## 2011-04-15 NOTE — Assessment & Plan Note (Signed)
[facility]                         ELECTROPHYSIOLOGY OFFICE NOTE   LAMBERTO, DINAPOLI                   MRN:          [id number]  DATE:12/20/2006                            DOB:          January 16, 1946    [name] was seen today in the clinic on the December 20, 2006, for a  wound check of his newly-implanted Medtronic, model number 7232, Maximo.  Date of implant was December 05, 2006, for ischemic cardiomyopathy and  ventricular tachycardia.   On interrogation of his device today, his battery voltage was 3.21,  charge time was not available.  R-waves measured 16.1 millivolts with a  ventricular pacing threshold of 1 volt at 0.2 milliseconds and a  ventricular lead impedance of 544.  Shock impedance was 60.  There were  no episodes since implant date.   I did program onset to the On position.  Steri-Strips were removed  today.  Wound was without redness or edema.  He has a  resolving  hematoma under his arm and he was also put on Coumadin for a blood clot,  post-device.   He will follow up with Dr. [name] in April.      Altha Harm, LPN  Electronically Signed      Doylene Canning. [name], MD  Electronically Signed   PO/MedQ  DD: 12/20/2006  DT: 12/20/2006  Job #: [id number]

## 2011-04-15 NOTE — Assessment & Plan Note (Signed)
[facility]                         ELECTROPHYSIOLOGY OFFICE NOTE   NGUYEN, TODOROV                   MRN:          [id number]  DATE:01/22/2007                            DOB:          [birth year]    [name] returns today for followup.  He was seen by Dr. Graciela Husbands a  week ago with problems of swelling over his ICD insertion site.  The  patient is a very pleasant middle-aged man with an ischemic  cardiomyopathy and sustained monomorphic ventricular tachycardia who  underwent ICD insertion after presenting with VT back in January.  The  patient had been stable but unfortunately developed a DVT in his left  arm necessitating the initiation of Coumadin therapy.  He has  subsequently developed swelling at the ICD insertion site.  He denies  fevers or chills.  He does have some erythema over the insertion site.  There is no tenderness there.  Yesterday he states that he had a  temperature of 99.5.  When he saw Dr. Graciela Husbands back a week ago his blood  pressure was elevated at 160/100 and his Altace was discontinued and he  was begun on lisinopril/HCTZ.  Otherwise, he had no specific complaints.   EXAM:  He is a pleasant middle-aged man in no acute distress.  The blood  pressure was 130/90, the pulse 76 and regular, respirations were 18, the  weight was 202 pounds.  NECK:  No jugular venous distention.  LUNGS:  Clear bilaterally to auscultation.  There were no wheezes, rales  or rhonchi.  CARDIOVASCULAR:  A regular rate and rhythm with a normal S1 and S2.  ICD  insertion site demonstrates a small hematoma.  There is minimal erythema  over the site, it is nontender.  The left arm has no swelling.   IMPRESSION:  1. Ischemic cardiomyopathy.  2. Sustained monomorphic ventricular tachycardia.  3. Status post implantable-cardioverter defibrillator insertion      complicated by deep vein thrombosis in the left arm.  4. Swelling at the implantable  cardioverter-defibrillator insertion      site, on Coumadin.   DISCUSSION:  [name] was seen by Dr. Graciela Husbands a week ago and begun on  antibiotics and had his Coumadin stopped.  He still has a pocket  hematoma.  There is no clear evidence that there is infection here,  although I am a bit concerned about it.  Will plan on having him  continue his Keflex and stop his Coumadin.  Ultimately, he may not  require additional Coumadin therapy for his left arm DVT, it is clearly  stable at present.     Doylene Canning. [name], MD  Electronically Signed    GWT/MedQ  DD: 01/22/2007  DT: 01/22/2007  Job #: [id number]

## 2011-04-15 NOTE — Discharge Summary (Signed)
[name], [name]            ACCOUNT NO.:  [id number]   MEDICAL RECORD NO.:  [id number]          PATIENT TYPE:  INP   LOCATION:  2922                         FACILITY:  [facility]   PHYSICIAN:  [name], MD, FACCDATE OF BIRTH:  [birth year]   DATE OF ADMISSION:  12/05/2006  DATE OF DISCHARGE:  12/07/2006                               DISCHARGE SUMMARY   PRIMARY CARDIOLOGIST:  Dr. Willa Rough.   PRIMARY CARE PHYSICIAN:  Dr. Rosalyn Gess. Norins.   CONSULTING PHYSICIAN:  Dr. Lewayne Bunting for electrophysiology.   PROCEDURE PERFORMED DURING HOSPITALIZATION:  1. Cardiac catheterization.      a.     Completed per Dr. Tonny Bollman revealing severe ischemic       cardiomyopathy, ventricular tachycardia, severe native 3-vessel       coronary artery disease status post coronary artery bypass graft       with 4 of 4 grafts patent (please see Dr. Casimiro Needle Cooper's       thorough cardiac catheterization dictation for details).  2. Placement of Medtronic AICD pacemaker per Dr. Lewayne Bunting on      December 05, 2006 secondary to ventricular tachycardia with an      ejection fraction of 25% (please see Dr. Lubertha Basque thorough      operative report for details).   PRIMARY DIAGNOSES:  1. Ischemic cardiomyopathy with an ejection fraction of 25%.  2. Sustained monomorphic ventricular tachycardia.   SECONDARY DIAGNOSES:  1. Hypertension.  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  3. Hyperlipidemia.  4. Gastroesophageal reflux disease.  5. History of esophageal stricture.  6. Granular annular.  7. History of coronary artery bypass grafting.      a.     Completed in 1991 with LIMA to left anterior descending,       saphenous vein graft to OM and circumflex and saphenous vein graft       to right coronary artery.   HISTORY OF PRESENT ILLNESS:  This is a 65 year old Caucasian male with  known coronary artery disease as described above with coronary artery  bypass grafting.  The patient began to  feel a sudden onset of chest pain  at approximately 8 a.m. day of admission.  He called EMS and when EMS  arrived he was found to be in ventricular tachycardia.  The patient was  given IV amiodarone and lidocaine and converted to normal sinus rhythm.  The patient's chest pain resolved after conversion. The patient was  brought to [facility] ER and admitted for emergent cardiac  catheterization.   HOSPITAL COURSE:  The patient was brought to the cardiac catheterization  lab for emergent cardiac catheterization with results described above.  The patient was pain free during this procedure.  The patient was found  to have severe ischemic cardiomyopathy and Dr. Lewayne Bunting, EP  specialist was consulted for AICD pacemaker in the setting of  monomorphic ventricular tachycardia.   Dr. [name] agreed with need for AICD pacemaker and Medtronic AICD  pacemaker was placed on December 05, 2006.  This is a Medtronic Angels,  Model 7232, single-chamber defibrillator, serial number  [id number].   The patient recovered well without evidence of bleeding, infection or  hematoma at either the pacemaker site or at the cardiac catheterization  site.  The patient was continued on aspirin, Lexapro, Lipitor, Coreg and  Protonix during hospitalization.  The patient had no further evidence of  ventricular tachycardia during hospitalization.   LABS ON DISCHARGE:  Hemoglobin 13.7, hematocrit 40.9, white blood cell  6.9, platelets 208, PT 12.5, INR 0.9, PT 26.  Sodium 138, potassium 3.6,  chloride 107, CO2 22, glucose 96, BUN 9, creatinine 1.01.  LFTs within  normal limits.  Chest x-ray revealing no pneumothorax after placement of  single-lead ICD.   Blood pressure, on discharge, 169/94, heart rate 99, respirations 22,  temperature 98.7.  Pacemaker site in the right upper chest is without  swelling, hematoma or signs of infection.  The patient has a sling  secure.   FOLLOWUP APPOINTMENTS AND PLANS ON  DISCHARGE:  1. The patient will have a post pacemaker insertion wound check on      January 23 at 9:20 a.m. through Dr. Lubertha Basque office.  2. The patient will see Dr. Willa Rough on February 11 at 10:15 a.m.      postdischarge.  3. The patient will see Dr. Lewayne Bunting April 22 at 9:20 a.m. for      post pacemaker followup appointment.  4. The patient has been given post AICD pacemaker instructions for      home use with specific reference to the patient's pacemaker site      for evidence of hematoma, infection or bleeding.  5. The patient has been given post cardiac catheterization      instructions with specific emphasis to the right groin site for      again evidence of swelling, hematoma or infection.   DISCHARGE MEDICATIONS:  1. Aspirin 325 one p.o. daily.  2. Lexapro 10 mg once a day.  3. Lipitor 80 mg once a day.  4. Coreg 6.25 mg b.i.d.  5. Protonix 40 mg once a day.   ALLERGIES:  NO KNOWN DRUG ALLERGIES.   Time spent with patient, to include physician time, 35 minutes.      Bettey Mare. Lyman Bishop, NP      [name], MD, [facility]  Electronically Signed   KML/MEDQ  D:  12/07/2006  T:  12/07/2006  Job:  [id number]   cc:   Rosalyn Gess. Norins, MD

## 2011-04-15 NOTE — Assessment & Plan Note (Signed)
[name]                         ELECTROPHYSIOLOGY OFFICE NOTE   [name], [name]                   MRN:          [id number]  DATE:02/12/2007                            DOB:          1946-03-25    [name] returns today for followup.  He is a very pleasant middle-  aged male with an ischemic cardiomyopathy and ventricular tachycardia  who underwent ICD implantation back in January which is complicated by  the development of a left arm DVT.  He was placed on Coumadin and then  developed pocket hematoma.  He was seen by Dr. Graciela Husbands and he was given  some antibiotics and had his Coumadin stopped.  I saw him back several  weeks ago with those symptoms, he had no fevers or chills and his pocket  was nontender.  He now returns for additional followup.  He has been off  Coumadin.  He notes that his swelling in the pocket has resolved.  He  has had no drainage.  He denies fevers or chills.   On exam today he is a pleasant well-appearing man in no acute distress.  Blood pressure was 142/90, the pulse 64 and regular, respirations were  18, the weight was 205 pounds.  Neck revealed no jugular venous  distention.  Lungs clear bilaterally to auscultation.  There is no  wheezes, rales, or rhonchi.  The cardiovascular exam revealed regular  rate and rhythm with normal S1 and S2.  The extremities demonstrate no  cyanosis, clubbing, or edema.  ICD pocket was without hematoma.  There  was no drainage.  There was no erythema.   Interrogation of his defibrillator demonstrates a Medtronic Maxima with  R waves of 16, the impedance 584, the threshold volt of 0.2, battery  voltage 3.21 volts.   IMPRESSION:  1. Ischemic cardiomyopathy.  2. Ventricular tachycardia.  3. Status post implantable cardioverter/defibrillator insertion.  4. Implantable cardioverter/defibrillator pocket hematoma.   DISCUSSION:  [name]'s pocket hematoma has resolved.  There is  no  evidence of any infection.  Because his DVT appears resolved in that  there is no swelling in his left arm and no tenderness there, I have  asked that he hold off on his Coumadin for now.  If his arm were to  swell back up then we might have to reinitiate the Coumadin but for now  we will not secondary to concerns about reproducing his pocket hematoma.     Doylene Canning. [name], MD  Electronically Signed    GWT/MedQ  DD: 02/12/2007  DT: 02/12/2007  Job #: [id number]

## 2011-04-15 NOTE — Assessment & Plan Note (Signed)
[name]                         ELECTROPHYSIOLOGY OFFICE NOTE   [name], [name]                   MRN:          [id number]  DATE:01/17/2007                            DOB:          10-11-1946    [name] is seen. He is about 6 weeks' status post ICD implantation  for primary prevention. The procedure was complicated by DVT in his left  arm prompting the initiation of Coumadin. About three days ago, he noted  significant progressive swelling over his device pocket. He has had no  fevers or chills.   His medications are notable for:  1. Coumadin as noted.  2. Wellbutrin.  3. Coreg.  4. Lexapro.  5. Lipitor.  6. Aspirin.  7. Altace.   On examination, his blood pressure was 169/100, pulse 57, weight 204.  His device pocket was indeed swollen. It was soft. It was a little bit  warm but not terribly. There was no significant erythema.  LUNGS:  Were clear.  HEART:  Sounds were regular.   IMPRESSION:  1. Pocket fluid collection, question infection, question hematoma.  2. Status post ICD.  3. Deep vein thrombosis related to #2, prompting Coumadin therapy.  4. Hypertension - poorly controlled.  5. Ischemic cardiomyopathy with left ventricular dysfunction.   I will take the liberty of:  1. Putting him on Keflex although I realize there is really little      impact that we can have on an infection if it is there, but      hopefully, we can prevent the seeding of an infection if this is      all hematoma.  2. I am going to ask him to hold his Coumadin for 1 week as his INR      the other day was 3.3.  3. We will change his Altace to Lotensin HCT because his blood      pressure is poorly controlled.  4. We will have him come back and see Dr. Ladona [name] next week at the end      of the week to see how his pocket is doing.   He will follow up with Dr. Myrtis [name] as otherwise scheduled.     Peter Salvia, MD, [facility]  Electronically  Signed    SCK/MedQ  DD: 01/17/2007  DT: 01/18/2007  Job #: [id number]

## 2011-04-15 NOTE — Cardiovascular Report (Signed)
[name], [name]            ACCOUNT NO.:  [id number]   MEDICAL RECORD NO.:  [id number]          PATIENT TYPE:  OIB   LOCATION:  2807                         FACILITY:  [facility]   PHYSICIAN:  Adir Schicker. Excell Seltzer, MD  DATE OF BIRTH:  1946/02/14   DATE OF PROCEDURE:  12/05/2006  DATE OF DISCHARGE:                            CARDIAC CATHETERIZATION   PROCEDURE:  Left heart catheterization, selective coronary angiography,  left ventricular angiography, saphenous vein graft angiography, left  subclavian angiography, and Star Close of the right femoral artery.   INDICATIONS:  [name] is a very nice 65 year old gentleman who  developed chest pain early this morning.  He has a history of coronary  artery disease status post bypass surgery in 1991.  His wife called EMS  and upon their arrival, the patient's heart rhythm was noted to be  ventricular tachycardia.  He was given an amiodarone bolus, a lidocaine  bolus, and started on a lidocaine drip.  He converted to sinus rhythm.  As soon as he converted, his chest pain resolved.  He was brought  emergently to the cardiac catheterization lab because of his high-risk  features with known coronary artery disease, chest pain, and ventricular  tachycardia.   PROCEDURAL DETAILS:  Risks and indications of the procedure were  reviewed with the patient.  Informed consent was obtained.  The right  groin was prepped, draped and anesthetized with 1% lidocaine.  Using the  modified Seldinger technique a 6-French sheath was placed in the right  femoral artery.  The left and right coronary arteries were imaged with a  JL-4 and JR-4 catheter.  Following native vessel angiography, the JR-4  catheter was used to image the saphenous vein graft to the right  coronary artery as well as the saphenous vein graft to the left  circumflex system.  The JR-4 was then inserted into the left subclavian  and subclavian angiography was performed.  The left subclavian  was  extremely tortuous.  I attempted to advance the catheter into the LIMA  using an exchange length wire, which was unsuccessful.  I then used a  Glidewire and a Wholey wire but was unable to advance the wire into the  distal left subclavian for catheter advancement to the LIMA.  Therefore,  I performed nonselective LIMA angiography, which clearly demonstrated a  patent LIMA to LAD graft.  Following bypass graft angiography, a pigtail  catheter was inserted into the left ventricle and pressures were  recorded.  A left ventriculogram was done.  A pullback across the aortic  valve was performed.  At the conclusion of the case a Star Close device  was used to seal the right femoral arteriotomy.   FINDINGS:  Aortic pressure 146/84 with a mean of 113, left ventricular  pressure 146/14 with an end-diastolic pressure of 26.   CORONARY ANGIOGRAPHY:  The left mainstem is mildly calcified.  It is  angiographically normal.  The left mainstem bifurcates into the LAD and  left circumflex.   The LAD is completely occluded at its ostium.   The left circumflex is occluded in its proximal portion.  It is mildly  calcified.   Right coronary artery:  The right coronary artery is occluded in its  proximal portion.   Saphenous vein graft angiography:  The saphenous vein graft to the  distal right coronary artery is widely patent.  There is TIMI4  flow in  this graft and native vessel.  The bypass graft supplies a large PDA and  a posterior AV segment that gives off a posterolateral branch and an AV-  nodal artery.  Those vessels both have luminal irregularities but no  significant obstructive disease.  The distal right coronary artery fills  in a retrograde fashion via graft flow.  There is a severe stenosis  noted just proximal to the graft insertion site.  The native right  coronary artery back to its midportion appears to be severely diseased.   Saphenous vein graft sequenced to the first obtuse  marginal and mid left  circumflex is widely patent.  There is TIMI-3 flow in the bypass graft  as well as the native vessels.  The obtuse marginal portion of the  sequence is widely patent.  The mid circumflex portion of the sequence  is also widely patent.  There is a branching point of the second  marginal branch that has an 80% focal stenosis at its ostium.  This is a  relatively small vessel.  The third obtuse marginal is a medium-caliber  vessel that gives off multiple branches and is widely patent.  The  native circumflex fills retrograde via the bypass graft.  It appears to  be severely diseased where the true circumflex originates.   LIMA to LAD was imaged nonselectively due to severe left subclavian  tortuosity.  The LIMA is widely patent and supplies the LAD in its  midportion.  The mid and distal LAD fill in antegrade fashion from the  bypass graft and appear to have no obstructive disease.  The proximal  LAD fills retrograde from the bypass graft, gives off a diagonal branch  as well as some proximal perforators, and it also appears to be patent.   Left subclavian angiography demonstrates extreme tortuosity of the left  subclavian without significant obstructive disease.  There is a large  left vertebral artery.   Left ventriculography demonstrates severe segmental left ventricular  dysfunction.  There is anteroapical akinesis.  The remaining segments of  the left ventricle are hypokinetic.  The overall left ventricular  ejection fraction is estimated at 25%.   ASSESSMENT:  1. Severe ischemic cardiomyopathy.  2. Ventricular tachycardia.  3. Severe native three-vessel coronary artery disease.  4. Status post coronary bypass surgery with four of four grafts      patent.   RECOMMENDATIONS:  Will continue medical therapy for the patient's  coronary artery disease and cardiomyopathy.  He does not have any indication at this point for percutaneous intervention.  The only   approachable lesion is in a branch vessel of the left circumflex, and  due to the vessel size as well as the relatively small area of  myocardium that it supplies, I do not think it warrants intervention.   I suspect the patient's primary event in this case was ventricular  tachycardia.  He was in ventricular tachycardia when the medics arrived  at his home.  His pain resolved after he converted to sinus rhythm and  was likely secondary to the hemodynamic effects of VT.  Will obtain an  inpatient EP evaluation with likely ICD implantation.  Of note, the  patient has  seen Dr. [name] as an outpatient, who recommended  prophylactic ICD but the patient has deferred in the past due to some  other medical issues.      Veverly Fells. Excell Seltzer, MD  Electronically Signed    MDC/MEDQ  D:  12/05/2006  T:  12/05/2006  Job:  [id number]   cc:   [name], MD, [facility]

## 2011-04-15 NOTE — Assessment & Plan Note (Signed)
[facility]                            CARDIOLOGY OFFICE NOTE   JESTEN, CAPPUCCIO                   MRN:          [id number]  DATE:01/08/2007                            DOB:          [birth year]    Mr. Disano is doing well.  He had been hospitalized and had an ICD  placed.  He was discharged from the hospital on December 07, 2006.  He  then had to be rehospitalized some swelling in his left upper extremity  due to arm DVT from his ICD placement, and he was treated with heparin  and Coumadin, and he stabilized.  He was then seen in followup on  December 20, 2006, in the EP Clinic.  He was stable.  He is now seen for  followup.  His arm is much improved.  His pacer site looks quite good.  The patient had actually been admitted with sustained ventricular  tachycardia.  Fortunately, he did not have sudden cardiac death.  He did  receive an ICD, and he is now doing well.   PAST MEDICAL HISTORY:   ALLERGIES:  NO KNOWN DRUG ALLERGIES.   MEDICATIONS:  1. Altace 10.  2. Nexium 40.  3. Aspirin 81.  4. Lipitor 80.  5. Lexapro 20.  6. Wellbutrin XR 300.  7. Coreg 25 b.i.d.  8. Coumadin as directed (We will make decisions about how long he      needs to be on Coumadin).   OTHER MEDICAL PROBLEMS:  See the list below.   REVIEW OF SYSTEMS:  He feels well today and is not having any  significant problems.  His review of systems otherwise is negative.   PHYSICAL EXAMINATION:  VITAL SIGNS:  Weight is 205 pounds.  Blood  pressure is 122/88.  Pulse is 57.  MENTAL STATUS:  The patient is oriented to person, time, and place.  Affect is normal.  HEENT:  He has no xanthelasma.  There is normal extraocular motion.  NECK:  There are no carotid bruits.  There in no jugular venous  distention.  LUNGS:  Clear.  Respiratory effort is not labored.  CHEST:  His ICD site is nicely healed.  His left arm is completely  improved and there is no significant  swelling.  ABDOMEN:  Soft.  He has no masses or bruits.  EXTREMITIES:  He has no significant peripheral edema.   EKG, today, reveals sinus bradycardia with his old anterior infarct.   PROBLEM LIST:  1. Attention deficit disorder.  He is stable.  2. Hyperlipidemia on medication.  3. Hypertension.  During the hospitalization his blood pressure had      been higher.  His blood pressure today is nicely controlled.  It      was important for [place] to be sure that his pressure was stable today      and that is one of the reasons for the visit.  4. History of reduced ejection fraction in the 25-30% range.  5. History of cancer of the tonsils, treated with radiation.  Because      of radiation, his thyroid needs to  be followed over time.  6. Gastroesophageal reflux disease.  7. History of esophageal stricture.  8. History of coronary disease, post coronary artery bypass graft in      1991, with a left internal mammary artery to the left anterior      descending artery and saphenous vein graft to obtuse marginal and      circumflex and saphenous vein graft to the right.  9. Cardiac catheterization was done on December 05, 2006, showing severe      3-vessel native disease.  Four of his four grafts were patent and      there was no indication for intervention.  10.Episode of sustained monomorphic ventricular tachycardia.  He was      fully assessed by Dr. [name].  He received an implantable      cardioverter-defibrillator by Dr. [name].  11.Left arm venous thrombosis that is now been treated with Coumadin.      We will check with Dr. [name] to be sure exactly how long the      patient needs to be on Coumadin.  His arm looks quite good at this      time.     [name], MD, [facility]  Electronically Signed    JDK/MedQ  DD: 01/08/2007  DT: 01/08/2007  Job #: [id number]   cc:   Dr. Madaline Guthrie, psychiatry  Rosalyn Gess. Norins, MD

## 2011-04-15 NOTE — H&P (Signed)
NAMETIP, ATIENZA            ACCOUNT NO.:  [id number]   MEDICAL RECORD NO.:  [id number]          PATIENT TYPE:  INP   LOCATION:  1845                         FACILITY:  [facility]   PHYSICIAN:  Arturo Morton. Riley Kill, MD, FACCDATE OF BIRTH:  01/30/1946   DATE OF ADMISSION:  12/12/2006  DATE OF DISCHARGE:                              HISTORY & PHYSICAL   CONTINUATION OF DICTATION:   REVIEW OF SYSTEMS:  To continue, he has a history of reflux symptoms  which are well-controlled on the Protonix, but denies hematemesis,  hemoptysis or melena.  Review of systems is otherwise negative.   PHYSICAL EXAMINATION:  VITAL SIGNS:  Temperature is 98.3, blood pressure  142/92, pulse 67, respiratory rate 18, O2 saturation 97% on room air.  GENERAL:  He is a well-developed, well-nourished white male in mild  distress.  HEENT:  Head is normocephalic and atraumatic with extraocular movements  intact.  Sclerae clear.  Nares without discharge.  NECK:  There is no lymphadenopathy, thyromegaly, bruit or JVD noted.  CV:  Heart is regular in rate and rhythm with an S1-S2 and no  significant murmur, rub or gallop is noted.  Distal pulses are 2+ in all  four extremities.  No femoral bruits are appreciated, and his cath site  is well-healed in the right groin.  LUNGS:  Essentially clear to auscultation bilaterally.  SKIN:  He has ecchymosis in the left upper extremity.  ABDOMEN:  Soft and nontender with active bowel sounds.  EXTREMITIES:  There is no clubbing noted.  He has edema and cyanosis in  the left upper extremity but no rash or petechiae are noted.  MUSCULOSKELETAL:  There is no joint deformity or effusions and no spine  or CVA tenderness.  NEUROLOGIC:  He is alert and oriented.  Cranial nerves II-XII grossly  intact.   EKG, chest x-ray and laboratory values are all pending.  Recent lipid  profile showed a total cholesterol of 181, triglycerides 186, HDL 64,  LDL 80.   IMPRESSION:  1. Left upper  extremity edema:  This is felt secondary to an upper      extremity deep vein thrombosis.  He will be started on heparin and      Coumadin.  Per Dr. [name], no invasive testing is indicated at this      time.  He will be kept in the hospital for a therapeutic Coumadin      level with 24 hours of overlap.  2. Ventricular tachycardia:  He will be continued on telemetry, and      his beta blocker will be continued, as well.  Per the patient, he      is not able to remember to take the Coreg twice a day, so he has      been taking 25 mg once a day.  At discharge, he was supposed to be      on 6.25 mg b.i.d., so we will increase that to 12.5 mg b.i.d. and      follow him on this dose.  Of note, he states that his insurance  company will not cover the Coreg CR daily drug.  Medication change      is per M.D.  3. Coronary artery disease:  He has had no ischemic symptoms.  He will      be continued on aspirin and beta blocker.  4. Mr. Kossman is otherwise stable and will be continued on his home      medications.      Theodore Demark, PA-C      Arturo Morton. Riley Kill, MD, [facility]  Electronically Signed    RB/MEDQ  D:  12/12/2006  T:  12/12/2006  Job:  [id number]   cc:   Rosalyn Gess. Norins, MD

## 2011-04-28 ENCOUNTER — Encounter: Payer: Self-pay | Admitting: Cardiology

## 2011-05-18 ENCOUNTER — Ambulatory Visit
Admission: RE | Admit: 2011-05-18 | Discharge: 2011-05-18 | Disposition: A | Discharge status: Home / Self Care | Payer: BC Managed Care – PPO | Source: Ambulatory Visit | Attending: Radiation Oncology | Admitting: Radiation Oncology

## 2011-05-18 DIAGNOSIS — Z85819 Personal history of malignant neoplasm of unspecified site of lip, oral cavity, and pharynx: Secondary | ICD-10-CM | POA: Insufficient documentation

## 2011-05-18 DIAGNOSIS — I509 Heart failure, unspecified: Secondary | ICD-10-CM | POA: Insufficient documentation

## 2011-05-18 DIAGNOSIS — Z950 Presence of cardiac pacemaker: Secondary | ICD-10-CM | POA: Insufficient documentation

## 2011-05-18 DIAGNOSIS — E785 Hyperlipidemia, unspecified: Secondary | ICD-10-CM | POA: Insufficient documentation

## 2011-05-18 DIAGNOSIS — K219 Gastro-esophageal reflux disease without esophagitis: Secondary | ICD-10-CM | POA: Insufficient documentation

## 2011-05-18 DIAGNOSIS — I10 Essential (primary) hypertension: Secondary | ICD-10-CM | POA: Insufficient documentation

## 2011-05-18 DIAGNOSIS — I252 Old myocardial infarction: Secondary | ICD-10-CM | POA: Insufficient documentation

## 2011-05-18 DIAGNOSIS — Z951 Presence of aortocoronary bypass graft: Secondary | ICD-10-CM | POA: Insufficient documentation

## 2011-05-18 DIAGNOSIS — C61 Malignant neoplasm of prostate: Secondary | ICD-10-CM | POA: Insufficient documentation

## 2011-06-16 ENCOUNTER — Encounter: Payer: BC Managed Care – PPO | Admitting: *Deleted

## 2011-06-20 ENCOUNTER — Encounter: Payer: Self-pay | Admitting: *Deleted

## 2011-06-23 ENCOUNTER — Other Ambulatory Visit: Payer: Self-pay | Admitting: Internal Medicine

## 2011-06-23 ENCOUNTER — Ambulatory Visit (INDEPENDENT_AMBULATORY_CARE_PROVIDER_SITE_OTHER): Payer: BC Managed Care – PPO | Admitting: *Deleted

## 2011-06-23 DIAGNOSIS — I472 Ventricular tachycardia: Secondary | ICD-10-CM

## 2011-06-28 NOTE — Progress Notes (Signed)
ICD remote 

## 2011-07-05 ENCOUNTER — Encounter: Payer: Self-pay | Admitting: *Deleted

## 2011-07-13 ENCOUNTER — Ambulatory Visit: Payer: BC Managed Care – PPO | Admitting: Radiation Oncology

## 2011-07-25 ENCOUNTER — Ambulatory Visit (INDEPENDENT_AMBULATORY_CARE_PROVIDER_SITE_OTHER): Payer: BC Managed Care – PPO | Admitting: Ophthalmology

## 2011-07-25 DIAGNOSIS — H43819 Vitreous degeneration, unspecified eye: Secondary | ICD-10-CM

## 2011-07-25 DIAGNOSIS — Q143 Congenital malformation of choroid: Secondary | ICD-10-CM

## 2011-07-25 DIAGNOSIS — H3530 Unspecified macular degeneration: Secondary | ICD-10-CM

## 2011-07-25 DIAGNOSIS — H33309 Unspecified retinal break, unspecified eye: Secondary | ICD-10-CM

## 2011-08-17 ENCOUNTER — Ambulatory Visit
Admission: RE | Admit: 2011-08-17 | Discharge: 2011-08-17 | Disposition: A | Discharge status: Home / Self Care | Payer: BC Managed Care – PPO | Source: Ambulatory Visit | Attending: Radiation Oncology | Admitting: Radiation Oncology

## 2011-08-17 DIAGNOSIS — C61 Malignant neoplasm of prostate: Secondary | ICD-10-CM | POA: Insufficient documentation

## 2011-08-17 DIAGNOSIS — Z510 Encounter for antineoplastic radiation therapy: Secondary | ICD-10-CM | POA: Insufficient documentation

## 2011-08-17 DIAGNOSIS — Z950 Presence of cardiac pacemaker: Secondary | ICD-10-CM | POA: Insufficient documentation

## 2011-08-30 LAB — BASIC METABOLIC PANEL
BUN: 19
CO2: 28
Chloride: 107
Creatinine, Ser: 0.88
Glucose, Bld: 97
Potassium: 4.4

## 2011-08-30 LAB — CBC
HCT: 37.4 — ABNORMAL LOW
MCHC: 31.5
MCV: 81.8
Platelets: 210

## 2011-09-02 ENCOUNTER — Encounter: Payer: Self-pay | Admitting: *Deleted

## 2011-09-22 ENCOUNTER — Encounter: Payer: Self-pay | Admitting: Internal Medicine

## 2011-09-22 ENCOUNTER — Ambulatory Visit (INDEPENDENT_AMBULATORY_CARE_PROVIDER_SITE_OTHER): Payer: BC Managed Care – PPO | Admitting: *Deleted

## 2011-09-22 ENCOUNTER — Other Ambulatory Visit: Payer: Self-pay | Admitting: Internal Medicine

## 2011-09-22 DIAGNOSIS — I472 Ventricular tachycardia: Secondary | ICD-10-CM

## 2011-09-22 DIAGNOSIS — Z95810 Presence of automatic (implantable) cardiac defibrillator: Secondary | ICD-10-CM

## 2011-09-28 ENCOUNTER — Encounter: Payer: Self-pay | Admitting: Cardiology

## 2011-09-29 NOTE — Progress Notes (Signed)
icd remote check  

## 2011-10-03 ENCOUNTER — Ambulatory Visit
Admission: RE | Admit: 2011-10-03 | Discharge: 2011-10-03 | Disposition: A | Discharge status: Home / Self Care | Payer: BC Managed Care – PPO | Source: Ambulatory Visit | Attending: Radiation Oncology | Admitting: Radiation Oncology

## 2011-10-03 DIAGNOSIS — C61 Malignant neoplasm of prostate: Secondary | ICD-10-CM

## 2011-10-04 ENCOUNTER — Ambulatory Visit
Admission: RE | Admit: 2011-10-04 | Discharge: 2011-10-04 | Disposition: A | Discharge status: Home / Self Care | Payer: BC Managed Care – PPO | Source: Ambulatory Visit | Attending: Radiation Oncology | Admitting: Radiation Oncology

## 2011-10-05 ENCOUNTER — Ambulatory Visit
Admission: RE | Admit: 2011-10-05 | Discharge: 2011-10-05 | Disposition: A | Discharge status: Home / Self Care | Payer: BC Managed Care – PPO | Source: Ambulatory Visit | Attending: Radiation Oncology | Admitting: Radiation Oncology

## 2011-10-06 ENCOUNTER — Ambulatory Visit
Admission: RE | Admit: 2011-10-06 | Discharge: 2011-10-06 | Disposition: A | Discharge status: Home / Self Care | Payer: BC Managed Care – PPO | Source: Ambulatory Visit | Attending: Radiation Oncology | Admitting: Radiation Oncology

## 2011-10-07 ENCOUNTER — Ambulatory Visit
Admission: RE | Admit: 2011-10-07 | Discharge: 2011-10-07 | Disposition: A | Discharge status: Home / Self Care | Payer: BC Managed Care – PPO | Source: Ambulatory Visit | Attending: Radiation Oncology | Admitting: Radiation Oncology

## 2011-10-10 ENCOUNTER — Ambulatory Visit
Admission: RE | Admit: 2011-10-10 | Discharge: 2011-10-10 | Disposition: A | Discharge status: Home / Self Care | Payer: BC Managed Care – PPO | Source: Ambulatory Visit | Attending: Radiation Oncology | Admitting: Radiation Oncology

## 2011-10-10 DIAGNOSIS — C61 Malignant neoplasm of prostate: Secondary | ICD-10-CM

## 2011-10-10 NOTE — Progress Notes (Signed)
Slight dysuria on flomax, no other c/o, slight fatigue, 1 more tx, f./u appt given

## 2011-10-10 NOTE — Progress Notes (Signed)
[facility] Radiation Oncology Weekly Treatment Note    Name: [name] Date: 10/10/2011 MRN: [id number] DOB: [birth year]  Status:outpatient    Current dose: 7605cGy  Current fraction:39  Planned dose:7800cGy  Planned fraction:40   ALLERGIES: Review of patient's allergies indicates no known allergies.     NARRATIVE: [name] was seen today for weekly treatment management. The chart was checked and CBCT images were reviewed.  Good bladder filling. No new GU or GI problems. Minimal symptoms.  PHYSICAL EXAMINATION: weight is 201 lb 3.2 oz (91.264 kg).         No change    ASSESSMENT: Patient tolerating treatments well. To finish XRT tomorrow.   PLAN: Continue treatment as planned. FU in one month.

## 2011-10-11 ENCOUNTER — Ambulatory Visit
Admission: RE | Admit: 2011-10-11 | Discharge: 2011-10-11 | Disposition: A | Discharge status: Home / Self Care | Payer: BC Managed Care – PPO | Source: Ambulatory Visit | Attending: Radiation Oncology | Admitting: Radiation Oncology

## 2011-10-11 NOTE — Progress Notes (Signed)
CC:   Heloise Purpura, MD Rosalyn Gess. 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  REQUESTING PHYSICIAN:  Dr. Heloise Purpura.  INTENT:  Curative.  TREATMENT DATES:  08/17/2011 through 10/11/2011.  SITE/DOSE: 1. Prostate 7800 cGy in 40 sessions. 2. Seminal vesicles 5600 cGy in 40 sessions.  ENERGY/FIELDS:  Six MV photons.  VMAT modulated arc/IMRT.  NARRATIVE:  [name] tolerated his treatment beautifully with no significant GU or GI toxicity by completion of therapy.  PLAN:  Followup visit here in 1 month.    ______________________________ Maryln Gottron, M.D. RJM/MEDQ  D:  10/11/2011  T:  10/11/2011  Job:  [id number]

## 2011-10-12 NOTE — Progress Notes (Signed)
On 08/08/2011, [name] underwent his 1st treatment with IMRT.  He was treated with 2 modulated arcs representing 1 set of IMRT treatment devices [phone]).    ______________________________ Maryln Gottron, M.D. RJM/MEDQ  D:  10/11/2011  T:  10/11/2011  Job:  [id number]

## 2011-10-21 ENCOUNTER — Encounter: Payer: Self-pay | Admitting: *Deleted

## 2011-11-02 ENCOUNTER — Other Ambulatory Visit: Payer: Self-pay | Admitting: Cardiology

## 2011-11-09 ENCOUNTER — Encounter: Payer: Self-pay | Admitting: Radiation Oncology

## 2011-11-09 ENCOUNTER — Ambulatory Visit
Admission: RE | Admit: 2011-11-09 | Discharge: 2011-11-09 | Disposition: A | Discharge status: Home / Self Care | Payer: BC Managed Care – PPO | Source: Ambulatory Visit | Attending: Radiation Oncology | Admitting: Radiation Oncology

## 2011-11-09 VITALS — BP 136/86 | HR 64 | Temp 97.1°F | Resp 18 | Wt 197.9 lb

## 2011-11-09 DIAGNOSIS — C61 Malignant neoplasm of prostate: Secondary | ICD-10-CM

## 2011-11-09 NOTE — Progress Notes (Signed)
Followup note:  The patient returns today approximately 1 month following completion of external beam/IMRT in the management of his stage T2a high-risk adenocarcinoma prostate. He is without GU or GI toxicity. He tells me he'll see Dr. Laverle Patter In one month at which time he will continue with his androgen deprivation therapy.  He's not examined today.  Impression: Satisfactory progress with no radiation related toxicity.  Plan: He'll see Dr. Laverle Patter next month. I believe that he will continue his androgen deprivation therapy for another one half years. I've not scheduled the patient for a formal followup visit and I ask that Dr. Laverle Patter keep me posted on his progress.

## 2011-11-09 NOTE — Progress Notes (Signed)
Fu prostate, no c/o

## 2011-11-24 ENCOUNTER — Encounter: Payer: Self-pay | Admitting: Cardiology

## 2011-11-25 ENCOUNTER — Other Ambulatory Visit: Payer: Self-pay | Admitting: *Deleted

## 2011-11-25 MED ORDER — ESOMEPRAZOLE MAGNESIUM 40 MG PO CPDR: 40.0000 mg | DELAYED_RELEASE_CAPSULE | Freq: Every day | ORAL | Status: DC

## 2011-12-29 ENCOUNTER — Ambulatory Visit (INDEPENDENT_AMBULATORY_CARE_PROVIDER_SITE_OTHER): Payer: BC Managed Care – PPO | Admitting: *Deleted

## 2011-12-29 ENCOUNTER — Encounter: Payer: Self-pay | Admitting: Internal Medicine

## 2011-12-29 DIAGNOSIS — I472 Ventricular tachycardia: Secondary | ICD-10-CM

## 2011-12-29 DIAGNOSIS — Z95810 Presence of automatic (implantable) cardiac defibrillator: Secondary | ICD-10-CM

## 2011-12-31 LAB — REMOTE ICD DEVICE
BRDY-0002RV: 40 {beats}/min
CHARGE TIME: 7.98 s
DEV-0020ICD: NEGATIVE
RV LEAD IMPEDENCE ICD: 504 Ohm
TOT-0006: 20121025000000
TZAT-0001FASTVT: 1
TZAT-0004FASTVT: 8
TZAT-0004SLOWVT: 8
TZAT-0004SLOWVT: 8
TZAT-0005FASTVT: 88 pct
TZAT-0005SLOWVT: 84 pct
TZAT-0005SLOWVT: 91 pct
TZAT-0011FASTVT: 10 ms
TZAT-0012SLOWVT: 200 ms
TZAT-0012SLOWVT: 200 ms
TZAT-0020SLOWVT: 1.6 ms
TZAT-0020SLOWVT: 1.6 ms
TZON-0003SLOWVT: 400 ms
TZON-0011AFLUTTER: 70
TZST-0001FASTVT: 2
TZST-0001FASTVT: 3
TZST-0001FASTVT: 5
TZST-0001SLOWVT: 3
TZST-0001SLOWVT: 5
TZST-0003FASTVT: 25 J
TZST-0003FASTVT: 35 J
TZST-0003FASTVT: 35 J
TZST-0003SLOWVT: 25 J
VENTRICULAR PACING ICD: 0 pct

## 2012-01-04 ENCOUNTER — Encounter: Payer: Self-pay | Admitting: *Deleted

## 2012-01-04 NOTE — Progress Notes (Signed)
Remote defib check  

## 2012-01-16 ENCOUNTER — Encounter: Payer: Self-pay | Admitting: Cardiology

## 2012-01-16 DIAGNOSIS — I10 Essential (primary) hypertension: Secondary | ICD-10-CM | POA: Insufficient documentation

## 2012-01-16 DIAGNOSIS — I779 Disorder of arteries and arterioles, unspecified: Secondary | ICD-10-CM | POA: Insufficient documentation

## 2012-01-16 DIAGNOSIS — E785 Hyperlipidemia, unspecified: Secondary | ICD-10-CM | POA: Insufficient documentation

## 2012-01-16 DIAGNOSIS — I255 Ischemic cardiomyopathy: Secondary | ICD-10-CM | POA: Insufficient documentation

## 2012-01-16 DIAGNOSIS — Z951 Presence of aortocoronary bypass graft: Secondary | ICD-10-CM | POA: Insufficient documentation

## 2012-01-16 DIAGNOSIS — Z4502 Encounter for adjustment and management of automatic implantable cardiac defibrillator: Secondary | ICD-10-CM | POA: Insufficient documentation

## 2012-01-16 DIAGNOSIS — I8290 Acute embolism and thrombosis of unspecified vein: Secondary | ICD-10-CM | POA: Insufficient documentation

## 2012-01-16 DIAGNOSIS — I2510 Atherosclerotic heart disease of native coronary artery without angina pectoris: Secondary | ICD-10-CM | POA: Insufficient documentation

## 2012-01-16 DIAGNOSIS — I739 Peripheral vascular disease, unspecified: Secondary | ICD-10-CM

## 2012-01-17 ENCOUNTER — Encounter: Payer: Self-pay | Admitting: Cardiology

## 2012-01-17 ENCOUNTER — Ambulatory Visit (INDEPENDENT_AMBULATORY_CARE_PROVIDER_SITE_OTHER): Payer: BC Managed Care – PPO | Admitting: Cardiology

## 2012-01-17 ENCOUNTER — Telehealth: Payer: Self-pay | Admitting: Cardiology

## 2012-01-17 VITALS — BP 144/82 | HR 71 | Ht 72.0 in | Wt 196.0 lb

## 2012-01-17 DIAGNOSIS — Z4502 Encounter for adjustment and management of automatic implantable cardiac defibrillator: Secondary | ICD-10-CM

## 2012-01-17 DIAGNOSIS — I779 Disorder of arteries and arterioles, unspecified: Secondary | ICD-10-CM

## 2012-01-17 DIAGNOSIS — I2589 Other forms of chronic ischemic heart disease: Secondary | ICD-10-CM

## 2012-01-17 DIAGNOSIS — I255 Ischemic cardiomyopathy: Secondary | ICD-10-CM

## 2012-01-17 DIAGNOSIS — I2510 Atherosclerotic heart disease of native coronary artery without angina pectoris: Secondary | ICD-10-CM

## 2012-01-17 NOTE — Assessment & Plan Note (Signed)
The patient is on an ARB and beta blocker. I've discussed the possibility of spironolactone with him today. He is on Lupron for prostate CA. I'm not sure that we'll be appropriate to use spironolactone in the setting. He is very close to class I. As of today we will not use spironolactone.

## 2012-01-17 NOTE — Assessment & Plan Note (Signed)
Patient had minimal disease in 2012. His next Doppler can wait until next year.

## 2012-01-17 NOTE — Patient Instructions (Signed)
Your physician wants you to follow-up in: 1 year.  You will receive a reminder letter in the mail two months in advance. If you don't receive a letter, please call our office to schedule the follow-up appointment.  Your physician recommends that you return for lab work in: Thursday 01/19/12

## 2012-01-17 NOTE — Progress Notes (Signed)
HPI Patient is seen in followup coronary artery disease. I followed him for many years. He has LV dysfunction. He has no ongoing significant clinical congestive heart failure. He has an ICD in place. His last cath was in 2008 showing that his grafts were patent from his prior CABG. His ejection fraction is in the 25-30% range. He's not having any significant symptoms. He is being treated with Lupron for prostate CA.  As part of today's evaluation I have reviewed the patient's old cardiology records very carefully and I have completely upgraded the new electronic medical record. No Known Allergies  Current Outpatient Prescriptions  Medication Sig Dispense Refill  . aspirin 81 MG EC tablet Take 81 mg by mouth daily.        Marland Kitchen atorvastatin (LIPITOR) 80 MG tablet TAKE 1 TABLET BY MOUTH EVERY DAY  90 tablet  1  . busPIRone (BUSPAR) 10 MG tablet Take 20 mg by mouth daily.       . carvedilol (COREG CR) 80 MG 24 hr capsule Take 1 capsule (80 mg total) by mouth daily.  30 capsule  11  . escitalopram (LEXAPRO) 20 MG tablet Take 20 mg by mouth daily.        Marland Kitchen esomeprazole (NEXIUM) 40 MG capsule Take 1 capsule (40 mg total) by mouth daily before breakfast.  90 capsule  2  . lisdexamfetamine (VYVANSE) 50 MG capsule Take 100 mg by mouth every morning.      . Multiple Vitamins-Minerals (MULTIVITAMIN,TX-MINERALS) tablet Take 1 tablet by mouth daily.        . Multiple Vitamins-Minerals (OCUVITE PRESERVISION) TABS Take by mouth daily.       . NON FORMULARY Joint Juice: 8 oz daily       . valsartan (DIOVAN) 80 MG tablet Take 1 tablet (80 mg total) by mouth daily.  90 tablet  3    History   Social History  . Marital Status: Married    Spouse Name: N/A    Number of Children: 0  . Years of Education: N/A   Occupational History  . 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 Smoking status: Never Smoker   . Smokeless tobacco: Not on file  . Alcohol Use: 14.4 oz/week    24  Cans of beer per week  . Drug Use: No  . Sexually Active: Yes    Birth Control/ Protection: None   Other Topics Concern  . Not on file   Social History Narrative   [employer] - 4 yearsMarried '75No childrenMarriage in good health, wife in good health    Family History  Problem Relation Age of Onset  . Alzheimer's disease Mother   . Dementia Mother     living in [place]  . Hyperlipidemia Father   . Hypertension Father   . Heart disease Father     CAD, CHF, had pcemaker  . Heart disease Brother     Bypass surgery  . Colon cancer Neg Hx   . Prostate cancer Neg Hx   . Diabetes Neg Hx   . Breast cancer Sister     survivor    Past Medical History  Diagnosis Date  . Adult ADHD   . Malignant neoplasm of tonsil   . 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 Esophageal stricture   . Dyspepsia   . CAD (coronary  artery disease)     Cath 2008, all grafts patent,   . HTN (hypertension)   . Hyperlipidemia   . GERD (gastroesophageal reflux disease)   . Thrombosis of arm     Left  . Elevated PSA 02/15/2011  . ICD (implantable cardiac defibrillator) battery depletion     Placed 2008  (eepisodes  nonsustained monomorphic VT in the past)  . Hx of CABG     1991  . Venous thrombosis     Left arm  . Carotid artery disease     Doppler, February, 2012, mild plaque, 0-39% bilateral, plan followup in one year    Past Surgical History  Procedure Date  . Coronary artery bypass graft     LIMA_LAD, SVG-OM, Cx, RCA '91  . Rotator cuff repair 2005  . Tonsillectomy 2005    Malignant lesion, w/XRT  . Cystectomy     Hand  . Icd placement   . Thumb surgery   . 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 All of the systems  are reviewed and are negative.  PHYSICAL EXAM Patient is oriented to person time and place. Affect is normal. There is no jugulovenous distention. Lungs are clear. Respiratory effort is nonlabored. Cardiac exam reveals S1 and S2. There no clicks or significant murmurs. The abdomen is soft. There is no peripheral edema. There no musculoskeletal deformities. There are no skin rashes. Filed Vitals:   01/17/12 0928  BP: 144/82  Pulse: 71  Height: 6' (1.829 m)  Weight: 196 lb (88.905 kg)   EKG is done today and reviewed by me. There is cold interventricular conduction delay. There is old septal infarct. There is no significant change.  ASSESSMENT & PLAN

## 2012-01-17 NOTE — Assessment & Plan Note (Signed)
Coronary disease is stable. I've considered whether we should proceed with exercise testing. I decided not to at this point.

## 2012-01-17 NOTE — Telephone Encounter (Signed)
Labs Received from [facility]  Gave to Randall 01/17/12/KM

## 2012-01-17 NOTE — Assessment & Plan Note (Signed)
The patient has his scheduled ICD followup next month.

## 2012-01-19 ENCOUNTER — Other Ambulatory Visit (INDEPENDENT_AMBULATORY_CARE_PROVIDER_SITE_OTHER): Payer: BC Managed Care – PPO

## 2012-01-19 DIAGNOSIS — I2510 Atherosclerotic heart disease of native coronary artery without angina pectoris: Secondary | ICD-10-CM

## 2012-01-19 DIAGNOSIS — I2589 Other forms of chronic ischemic heart disease: Secondary | ICD-10-CM

## 2012-01-19 DIAGNOSIS — I779 Disorder of arteries and arterioles, unspecified: Secondary | ICD-10-CM

## 2012-01-19 DIAGNOSIS — Z4502 Encounter for adjustment and management of automatic implantable cardiac defibrillator: Secondary | ICD-10-CM

## 2012-01-19 DIAGNOSIS — I255 Ischemic cardiomyopathy: Secondary | ICD-10-CM

## 2012-01-25 ENCOUNTER — Ambulatory Visit (INDEPENDENT_AMBULATORY_CARE_PROVIDER_SITE_OTHER): Payer: BC Managed Care – PPO | Admitting: Ophthalmology

## 2012-01-25 DIAGNOSIS — H33009 Unspecified retinal detachment with retinal break, unspecified eye: Secondary | ICD-10-CM

## 2012-01-25 DIAGNOSIS — H3530 Unspecified macular degeneration: Secondary | ICD-10-CM

## 2012-01-25 DIAGNOSIS — Q143 Congenital malformation of choroid: Secondary | ICD-10-CM

## 2012-01-25 DIAGNOSIS — H33309 Unspecified retinal break, unspecified eye: Secondary | ICD-10-CM

## 2012-01-25 DIAGNOSIS — H43819 Vitreous degeneration, unspecified eye: Secondary | ICD-10-CM

## 2012-02-08 ENCOUNTER — Other Ambulatory Visit: Payer: Self-pay | Admitting: Cardiology

## 2012-02-08 DIAGNOSIS — I6529 Occlusion and stenosis of unspecified carotid artery: Secondary | ICD-10-CM

## 2012-02-09 ENCOUNTER — Telehealth: Payer: Self-pay | Admitting: Cardiology

## 2012-02-09 NOTE — Telephone Encounter (Signed)
Spoke with pt and reviewed lipid profile results and recommendations from Dr. Myrtis Ser with him.

## 2012-02-09 NOTE — Telephone Encounter (Signed)
Fu call °Patient returning your call °

## 2012-03-20 ENCOUNTER — Encounter: Payer: Self-pay | Admitting: Internal Medicine

## 2012-03-20 ENCOUNTER — Ambulatory Visit (INDEPENDENT_AMBULATORY_CARE_PROVIDER_SITE_OTHER): Payer: BC Managed Care – PPO | Admitting: Internal Medicine

## 2012-03-20 VITALS — BP 130/98 | HR 70 | Ht 72.0 in | Wt 194.8 lb

## 2012-03-20 DIAGNOSIS — I2589 Other forms of chronic ischemic heart disease: Secondary | ICD-10-CM

## 2012-03-20 DIAGNOSIS — Z4502 Encounter for adjustment and management of automatic implantable cardiac defibrillator: Secondary | ICD-10-CM

## 2012-03-20 DIAGNOSIS — I2510 Atherosclerotic heart disease of native coronary artery without angina pectoris: Secondary | ICD-10-CM

## 2012-03-20 DIAGNOSIS — I255 Ischemic cardiomyopathy: Secondary | ICD-10-CM

## 2012-03-20 LAB — ICD DEVICE OBSERVATION
BATTERY VOLTAGE: 3 V
DEV-0020ICD: NEGATIVE
PACEART VT: 0
RV LEAD THRESHOLD: 1 V
TZAT-0004FASTVT: 8
TZAT-0005SLOWVT: 84 pct
TZAT-0005SLOWVT: 91 pct
TZAT-0011SLOWVT: 10 ms
TZAT-0011SLOWVT: 10 ms
TZAT-0012FASTVT: 200 ms
TZAT-0012SLOWVT: 200 ms
TZAT-0012SLOWVT: 200 ms
TZAT-0013FASTVT: 1
TZAT-0018SLOWVT: NEGATIVE
TZAT-0018SLOWVT: NEGATIVE
TZAT-0019SLOWVT: 8 V
TZAT-0019SLOWVT: 8 V
TZAT-0020FASTVT: 1.6 ms
TZON-0003FASTVT: 240 ms
TZON-0003SLOWVT: 400 ms
TZON-0005SLOWVT: 12
TZON-0008SLOWVT: 0 ms
TZON-0011AFLUTTER: 70
TZST-0001FASTVT: 3
TZST-0001FASTVT: 4
TZST-0001FASTVT: 5
TZST-0001SLOWVT: 4
TZST-0001SLOWVT: 6
TZST-0003FASTVT: 35 J
TZST-0003FASTVT: 35 J
TZST-0003SLOWVT: 25 J
TZST-0003SLOWVT: 35 J
TZST-0003SLOWVT: 35 J
VENTRICULAR PACING ICD: 0 pct

## 2012-03-20 NOTE — Assessment & Plan Note (Signed)
His device is working normally. His battery is not yet at elective replacement.

## 2012-03-20 NOTE — Patient Instructions (Signed)
Your physician wants you to follow-up in: 12 months with Dr Court Joy will receive a reminder letter in the mail two months in advance. If you don't receive a letter, please call our office to schedule the follow-up appointment.   Remote monitoring is used to monitor your Pacemaker of ICD from home. This monitoring reduces the number of office visits required to check your device to one time per year. It allows [place] to keep an eye on the functioning of your device to ensure it is working properly. You are scheduled for a device check from home on 06/21/2012. You may send your transmission at any time that day. If you have a wireless device, the transmission will be sent automatically. After your physician reviews your transmission, you will receive a postcard with your next transmission date.

## 2012-03-20 NOTE — Assessment & Plan Note (Signed)
He denies anginal symptoms. He will continue his current medical therapy. 

## 2012-03-20 NOTE — Progress Notes (Signed)
HPI [name] returns today for followup. He is a very pleasant middle-age man with an ischemic cardiomyopathy, chronic systolic heart failure, and ventricular tachycardia. He is status post ICD implantation. He denies any recent ICD shocks. No peripheral edema, chest pain, no shortness of breath, no syncope. No Known Allergies   Current Outpatient Prescriptions  Medication Sig Dispense Refill  . aspirin 81 MG EC tablet Take 81 mg by mouth daily.        Marland Kitchen atorvastatin (LIPITOR) 80 MG tablet TAKE 1 TABLET BY MOUTH EVERY DAY  90 tablet  1  . busPIRone (BUSPAR) 10 MG tablet Take 20 mg by mouth daily.       Marland Kitchen escitalopram (LEXAPRO) 20 MG tablet Take 20 mg by mouth daily.        Marland Kitchen esomeprazole (NEXIUM) 40 MG capsule Take 1 capsule (40 mg total) by mouth daily before breakfast.  90 capsule  2  . lisdexamfetamine (VYVANSE) 50 MG capsule Take 100 mg by mouth every morning.      . Multiple Vitamins-Minerals (MULTIVITAMIN,TX-MINERALS) tablet Take 1 tablet by mouth daily.        . Multiple Vitamins-Minerals (OCUVITE PRESERVISION) TABS Take by mouth daily.       . NON FORMULARY Joint Juice: 8 oz daily       . valsartan (DIOVAN) 80 MG tablet Take 1 tablet (80 mg total) by mouth daily.  90 tablet  3  . carvedilol (COREG CR) 80 MG 24 hr capsule Take 1 capsule (80 mg total) by mouth daily.  30 capsule  11     Past Medical History  Diagnosis Date  . Adult ADHD   . Malignant neoplasm of tonsil   . 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 Esophageal stricture   . Dyspepsia   . CAD (coronary artery disease)     Cath 2008, all grafts patent,   . HTN (hypertension)   . Hyperlipidemia   . GERD (gastroesophageal reflux disease)   . Thrombosis of arm     Left  . Elevated PSA 02/15/2011  . ICD (implantable cardiac defibrillator) battery depletion     Placed 2008  (eepisodes  nonsustained monomorphic VT in the past)  . Hx of CABG       1991  . Venous thrombosis     Left arm  . Carotid artery disease     Doppler, February, 2012, mild plaque, 0-39% bilateral, plan followup in one year    ROS:   All systems reviewed and negative except as noted in the HPI.   Past Surgical History  Procedure Date  . Coronary artery bypass graft     LIMA_LAD, SVG-OM, Cx, RCA '91  . Rotator cuff repair 2005  . Tonsillectomy 2005    Malignant lesion, w/XRT  . Cystectomy     Hand  . Icd placement   . Thumb surgery   . Hand contracture release     bilateral release of Dupyrten's contracture: '10 and '11  . Cardiac catheterization 12/05/06     Family History  Problem Relation Age of Onset  . Alzheimer's disease Mother   . Dementia Mother     living in [place]  . Hyperlipidemia Father   . Hypertension Father   . Heart disease Father     CAD, CHF, had pcemaker  . Heart disease Brother     Bypass surgery  . Colon cancer Neg  Hx   . Prostate cancer Neg Hx   . Diabetes Neg Hx   . Breast cancer Sister     survivor     History   Social History  . Marital Status: Married    Spouse Name: N/A    Number of Children: 0  . Years of Education: N/A   Occupational History  . 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 Smoking status: Never Smoker   . Smokeless tobacco: Not on file  . Alcohol Use: 14.4 oz/week    24 Cans of beer per week  . Drug Use: No  . Sexually Active: Yes    Birth Control/ Protection: None   Other Topics Concern  . Not on file   Social History Narrative   [employer] - 4 yearsMarried '75No childrenMarriage in good health, wife in good health     BP 130/98  Pulse 70  Ht 6' (1.829 m)  Wt 194 lb 12.8 oz (88.361 kg)  BMI 26.42 kg/m2  Physical Exam:  Well appearing middle-aged man, NAD HEENT: Unremarkable Neck:  No JVD, no thyromegally Lungs:  Clear with no wheezes, rales, or rhonchi. HEART:  Regular rate rhythm, no murmurs, no rubs, no clicks Abd:   soft, positive bowel sounds, no organomegally, no rebound, no guarding Ext:  2 plus pulses, no edema, no cyanosis, no clubbing Skin:  No rashes no nodules Neuro:  CN II through XII intact, motor grossly intact  DEVICE  Normal device function.  See PaceArt for details.   Assess/Plan:

## 2012-04-12 ENCOUNTER — Other Ambulatory Visit: Payer: Self-pay | Admitting: Internal Medicine

## 2012-05-01 ENCOUNTER — Other Ambulatory Visit: Payer: Self-pay | Admitting: Cardiology

## 2012-05-09 ENCOUNTER — Other Ambulatory Visit: Payer: Self-pay | Admitting: Cardiology

## 2012-05-09 DIAGNOSIS — I5022 Chronic systolic (congestive) heart failure: Secondary | ICD-10-CM

## 2012-05-09 MED ORDER — CARVEDILOL PHOSPHATE ER 80 MG PO CP24: 80.0000 mg | ORAL_CAPSULE | Freq: Every day | ORAL | Status: DC

## 2012-06-09 ENCOUNTER — Emergency Department (HOSPITAL_COMMUNITY)
Admission: EM | Admit: 2012-06-09 | Discharge: 2012-06-09 | Disposition: A | Discharge status: Home / Self Care | Payer: BC Managed Care – PPO | Attending: Emergency Medicine | Admitting: Emergency Medicine

## 2012-06-09 ENCOUNTER — Encounter (HOSPITAL_COMMUNITY): Payer: Self-pay | Admitting: *Deleted

## 2012-06-09 DIAGNOSIS — I25810 Atherosclerosis of coronary artery bypass graft(s) without angina pectoris: Secondary | ICD-10-CM | POA: Insufficient documentation

## 2012-06-09 DIAGNOSIS — I10 Essential (primary) hypertension: Secondary | ICD-10-CM | POA: Insufficient documentation

## 2012-06-09 DIAGNOSIS — X58XXXA Exposure to other specified factors, initial encounter: Secondary | ICD-10-CM | POA: Insufficient documentation

## 2012-06-09 DIAGNOSIS — T7840XA Allergy, unspecified, initial encounter: Secondary | ICD-10-CM | POA: Insufficient documentation

## 2012-06-09 DIAGNOSIS — R238 Other skin changes: Secondary | ICD-10-CM | POA: Insufficient documentation

## 2012-06-09 MED ORDER — SODIUM CHLORIDE 0.9 % IV BOLUS (SEPSIS)
1000.0000 mL | Freq: Once | INTRAVENOUS | Status: AC
  Administered 2012-06-09: 1000 mL via INTRAVENOUS

## 2012-06-09 MED ORDER — ONDANSETRON HCL 4 MG/2ML IJ SOLN
4.0000 mg | Freq: Once | INTRAMUSCULAR | Status: AC
  Administered 2012-06-09: 4 mg via INTRAVENOUS
  Filled 2012-06-09: qty 2

## 2012-06-09 MED ORDER — FAMOTIDINE IN NACL 20-0.9 MG/50ML-% IV SOLN
20.0000 mg | Freq: Once | INTRAVENOUS | Status: AC
  Administered 2012-06-09: 20 mg via INTRAVENOUS
  Filled 2012-06-09: qty 50

## 2012-06-09 MED ORDER — EPINEPHRINE 0.3 MG/0.3ML IJ DEVI: 0.3000 mg | INTRAMUSCULAR | Status: DC | PRN

## 2012-06-09 MED ORDER — DIPHENHYDRAMINE HCL 50 MG/ML IJ SOLN
50.0000 mg | Freq: Once | INTRAMUSCULAR | Status: AC
  Administered 2012-06-09: 50 mg via INTRAVENOUS
  Filled 2012-06-09: qty 1

## 2012-06-09 MED ORDER — METHYLPREDNISOLONE SODIUM SUCC 125 MG IJ SOLR
125.0000 mg | Freq: Once | INTRAMUSCULAR | Status: AC
  Administered 2012-06-09: 125 mg via INTRAVENOUS
  Filled 2012-06-09: qty 2

## 2012-06-09 NOTE — ED Provider Notes (Addendum)
History    66yM with possible allergic reaction. Doing yard work and thinks was stung by something on R forearm around 1700. Did not actually see insect but felt sharp stinging pain. About 5-10 minutes later began feeling sick to stomach and like he may pass out. No SOB. Feels like his tongue is fat. No drooling. No wheezing. No hx of similar symptoms after insect bite/sting.    CSN: [id number]  Arrival date & time 06/09/12  4098   First MD Initiated Contact with Patient 06/09/12 1840      Chief Complaint  Patient presents with  . Allergic Reaction    (Consider location/radiation/quality/duration/timing/severity/associated sxs/prior treatment) HPI  Past Medical History  Diagnosis Date  . Adult ADHD   . Squamous cell carcinoma of tonsil   . 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 Esophageal stricture   . Dyspepsia   . CAD (coronary artery disease)     Cath 2008, all grafts patent,   . HTN (hypertension)   . Hyperlipidemia   . GERD (gastroesophageal reflux disease)   . Thrombosis of arm     Left  . Elevated PSA 02/15/2011  . ICD (implantable cardiac defibrillator) battery depletion     Placed 2008  (eepisodes  nonsustained monomorphic VT in the past)  . Hx of CABG     1991  . Venous thrombosis     Left arm  . Carotid artery disease     Doppler, February, 2012, mild plaque, 0-39% bilateral, plan followup in one year    Past Surgical History  Procedure Date  . Coronary artery bypass graft     LIMA_LAD, SVG-OM, Cx, RCA '91  . Rotator cuff repair 2005  . Tonsillectomy 2005    Malignant lesion, w/XRT  . Cystectomy     Hand  . Icd placement   . Thumb surgery   . Hand contracture release     bilateral release of Dupyrten's contracture: '10 and '11  . Cardiac catheterization 12/05/06    Family History  Problem Relation Age of Onset  . Alzheimer's disease Mother   . Dementia Mother     living in  [place]  . Hyperlipidemia Father   . Hypertension Father   . Heart disease Father     CAD, CHF, had pcemaker  . Heart disease Brother     Bypass surgery  . Colon cancer Neg Hx   . Prostate cancer Neg Hx   . Diabetes Neg Hx   . Breast cancer Sister     survivor    History  Substance Use Topics  . Smoking status: Never Smoker   . Smokeless tobacco: Not on file  . Alcohol Use: 14.4 oz/week    24 Cans of beer per week      Review of Systems   Review of symptoms negative unless otherwise noted in HPI.   Allergies  Review of patient's allergies indicates no known allergies.  Home Medications   Current Outpatient Rx  Name Route Sig Dispense Refill  . ASPIRIN 81 MG PO TBEC Oral Take 81 mg by mouth daily.      . ATORVASTATIN CALCIUM 80 MG PO TABS  TAKE 1 TABLET BY MOUTH EVERY DAY 90 tablet 1  . BUSPIRONE HCL 10 MG PO TABS Oral Take 20 mg by mouth daily.     Marland Kitchen CARVEDILOL PHOSPHATE ER 80 MG PO CP24 Oral Take 1 capsule (80  mg total) by mouth daily. 90 capsule 11  . DIOVAN 80 MG PO TABS  TAKE 1 TABLET DAILY. 90 tablet 3  . ESCITALOPRAM OXALATE 20 MG PO TABS Oral Take 20 mg by mouth daily.      Marland Kitchen ESOMEPRAZOLE MAGNESIUM 40 MG PO CPDR Oral Take 1 capsule (40 mg total) by mouth daily before breakfast. 90 capsule 2  . LISDEXAMFETAMINE DIMESYLATE 50 MG PO CAPS Oral Take 100 mg by mouth every morning.    . SUPER HIGH VITAMINS/MINERALS PO TABS Oral Take 1 tablet by mouth daily.      [place]olina Primer PRESERVISION PO TABS Oral Take by mouth daily.     . NON FORMULARY  Joint Juice: 8 oz daily       BP 123/84  Temp 97.3 F (36.3 C) (Oral)  Resp 18  SpO2 98%  Physical Exam  Nursing note and vitals reviewed. Constitutional: He is oriented to person, place, and time. He appears well-developed and well-nourished. No distress.  HENT:  Head: Normocephalic and atraumatic.       Mild facial puffiness, but no overt edema. Posterior pharynx is clear. Handling secretions. Voice is normal. No tongue  swelling or elevation noted. No stridor or wheezing.  Eyes: Conjunctivae are normal. Pupils are equal, round, and reactive to light. Right eye exhibits no discharge. Left eye exhibits no discharge.  Neck: Neck supple.  Cardiovascular: Normal rate, regular rhythm and normal heart sounds.  Exam reveals no gallop and no friction rub.   No murmur heard. Pulmonary/Chest: Effort normal and breath sounds normal. No respiratory distress.  Abdominal: Soft. He exhibits no distension. There is no tenderness.  Musculoskeletal: He exhibits no edema and no tenderness.  Neurological: He is alert and oriented to person, place, and time. No cranial nerve deficit. He exhibits normal muscle tone. Coordination normal.  Skin: Skin is warm and dry. He is not diaphoretic.       Mild diffuse tenderness of the dorsal aspect of the right forearm. No skin lesions noted. No risk intact distally.  Psychiatric: He has a normal mood and affect. His behavior is normal. Thought content normal.    ED Course  Procedures (including critical care time)  Labs Reviewed - No data to display No results found.  EKG:  Rhythm: Sinus bradycardia Rate: 89 Axis: Left Intervals: Normal ST segments: Nondiagnostic ST patient in V1. Nonspecific ST changes across the precordium. Interpretation: Abnormal EKG but stable from previous.    1. Allergic reaction       MDM  66 year old male with allergic reaction to insect stings. Mild to moderate reaction. Was treated symptomatically. Was observed for up to 4 hours after the onset of symptoms. Continued improvement of symptoms. He is hemodynamically stable. He was not given epinephrine in the emergency room. He will be discharged with EpiPens though. Discussed indications for use. Return precautions were discussed. Outpatient followup        Raeford Razor, MD 06/09/12 2149  Raeford Razor, MD 06/29/12 1144

## 2012-06-09 NOTE — ED Notes (Signed)
Push mowing the lawn and got stung by bee on rt. Wrist. Redness, warmth at site. Became nausea and diaphoretic. Denies sob and cp.

## 2012-06-21 ENCOUNTER — Encounter: Payer: BC Managed Care – PPO | Admitting: *Deleted

## 2012-06-26 ENCOUNTER — Encounter: Payer: Self-pay | Admitting: *Deleted

## 2012-07-02 ENCOUNTER — Encounter: Payer: Self-pay | Admitting: Internal Medicine

## 2012-07-02 ENCOUNTER — Ambulatory Visit (INDEPENDENT_AMBULATORY_CARE_PROVIDER_SITE_OTHER): Payer: BC Managed Care – PPO | Admitting: *Deleted

## 2012-07-02 DIAGNOSIS — I2589 Other forms of chronic ischemic heart disease: Secondary | ICD-10-CM

## 2012-07-02 DIAGNOSIS — I255 Ischemic cardiomyopathy: Secondary | ICD-10-CM

## 2012-07-03 ENCOUNTER — Encounter: Payer: Self-pay | Admitting: *Deleted

## 2012-07-03 LAB — REMOTE ICD DEVICE
BATTERY VOLTAGE: 3 V
RV LEAD AMPLITUDE: 15.7 mv
RV LEAD IMPEDENCE ICD: 528 Ohm
TOT-0006: 20130423000000
TZAT-0004SLOWVT: 8
TZAT-0004SLOWVT: 8
TZAT-0005FASTVT: 88 pct
TZAT-0005SLOWVT: 84 pct
TZAT-0005SLOWVT: 91 pct
TZAT-0011FASTVT: 10 ms
TZAT-0011SLOWVT: 10 ms
TZAT-0011SLOWVT: 10 ms
TZAT-0012FASTVT: 200 ms
TZAT-0012SLOWVT: 200 ms
TZAT-0012SLOWVT: 200 ms
TZAT-0013SLOWVT: 4
TZAT-0013SLOWVT: 4
TZAT-0018FASTVT: NEGATIVE
TZAT-0018SLOWVT: NEGATIVE
TZAT-0019FASTVT: 8 V
TZON-0003FASTVT: 240 ms
TZON-0003SLOWVT: 400 ms
TZON-0004SLOWVT: 24
TZON-0005SLOWVT: 12
TZON-0008FASTVT: 0 ms
TZST-0001FASTVT: 3
TZST-0001FASTVT: 5
TZST-0001SLOWVT: 3
TZST-0001SLOWVT: 4
TZST-0001SLOWVT: 6
TZST-0003FASTVT: 25 J
TZST-0003FASTVT: 35 J
TZST-0003FASTVT: 35 J
TZST-0003SLOWVT: 35 J
VENTRICULAR PACING ICD: 0 pct

## 2012-07-12 ENCOUNTER — Encounter: Payer: Self-pay | Admitting: *Deleted

## 2012-07-24 ENCOUNTER — Ambulatory Visit (INDEPENDENT_AMBULATORY_CARE_PROVIDER_SITE_OTHER): Payer: BC Managed Care – PPO | Admitting: Ophthalmology

## 2012-07-24 DIAGNOSIS — Q143 Congenital malformation of choroid: Secondary | ICD-10-CM

## 2012-07-24 DIAGNOSIS — H35039 Hypertensive retinopathy, unspecified eye: Secondary | ICD-10-CM

## 2012-07-24 DIAGNOSIS — I10 Essential (primary) hypertension: Secondary | ICD-10-CM

## 2012-07-24 DIAGNOSIS — H43819 Vitreous degeneration, unspecified eye: Secondary | ICD-10-CM

## 2012-07-24 DIAGNOSIS — H33309 Unspecified retinal break, unspecified eye: Secondary | ICD-10-CM

## 2012-07-24 DIAGNOSIS — H3530 Unspecified macular degeneration: Secondary | ICD-10-CM

## 2012-08-17 ENCOUNTER — Other Ambulatory Visit: Payer: Self-pay | Admitting: Internal Medicine

## 2012-10-01 ENCOUNTER — Ambulatory Visit (INDEPENDENT_AMBULATORY_CARE_PROVIDER_SITE_OTHER): Payer: BC Managed Care – PPO | Admitting: *Deleted

## 2012-10-01 ENCOUNTER — Encounter: Payer: Self-pay | Admitting: Internal Medicine

## 2012-10-01 DIAGNOSIS — Z4502 Encounter for adjustment and management of automatic implantable cardiac defibrillator: Secondary | ICD-10-CM

## 2012-10-01 DIAGNOSIS — I2589 Other forms of chronic ischemic heart disease: Secondary | ICD-10-CM

## 2012-10-01 DIAGNOSIS — I255 Ischemic cardiomyopathy: Secondary | ICD-10-CM

## 2012-10-08 LAB — REMOTE ICD DEVICE
DEV-0020ICD: NEGATIVE
RV LEAD IMPEDENCE ICD: 544 Ohm
TOT-0006: 20130803000000
TZAT-0004SLOWVT: 8
TZAT-0004SLOWVT: 8
TZAT-0005FASTVT: 88 pct
TZAT-0005SLOWVT: 91 pct
TZAT-0011FASTVT: 10 ms
TZAT-0011SLOWVT: 10 ms
TZAT-0011SLOWVT: 10 ms
TZAT-0012SLOWVT: 200 ms
TZAT-0012SLOWVT: 200 ms
TZAT-0013FASTVT: 1
TZAT-0013SLOWVT: 4
TZAT-0013SLOWVT: 4
TZAT-0018FASTVT: NEGATIVE
TZAT-0020SLOWVT: 1.6 ms
TZON-0003SLOWVT: 400 ms
TZON-0004SLOWVT: 24
TZON-0011AFLUTTER: 70
TZST-0001FASTVT: 3
TZST-0001FASTVT: 5
TZST-0001SLOWVT: 4
TZST-0001SLOWVT: 6
TZST-0003FASTVT: 35 J
TZST-0003FASTVT: 35 J
TZST-0003SLOWVT: 25 J
TZST-0003SLOWVT: 35 J
TZST-0003SLOWVT: 35 J
VENTRICULAR PACING ICD: 0 pct

## 2012-10-16 ENCOUNTER — Encounter: Payer: Self-pay | Admitting: *Deleted

## 2012-10-31 ENCOUNTER — Other Ambulatory Visit: Payer: Self-pay

## 2012-10-31 MED ORDER — ATORVASTATIN CALCIUM 80 MG PO TABS: 80.0000 mg | ORAL_TABLET | Freq: Every day | ORAL | Status: DC

## 2012-11-05 ENCOUNTER — Other Ambulatory Visit: Payer: Self-pay | Admitting: Internal Medicine

## 2012-12-03 ENCOUNTER — Other Ambulatory Visit: Payer: Self-pay | Admitting: Internal Medicine

## 2012-12-05 ENCOUNTER — Other Ambulatory Visit: Payer: Self-pay | Admitting: *Deleted

## 2012-12-05 MED ORDER — ATORVASTATIN CALCIUM 80 MG PO TABS: 80.0000 mg | ORAL_TABLET | Freq: Every day | ORAL | Status: DC

## 2013-01-01 ENCOUNTER — Telehealth: Payer: Self-pay | Admitting: Cardiology

## 2013-01-01 NOTE — Telephone Encounter (Signed)
Will forward to Debby Lefler.  Left message that Debby will check with Dr. Myrtis Ser and will be back in touch.

## 2013-01-01 NOTE — Telephone Encounter (Signed)
New Problem:    Patient called in wanting to what type of labs he needs drawn before his next appointment.  Please call back.

## 2013-01-02 NOTE — Telephone Encounter (Signed)
Does [name] need labs before his next appt?  Last BMET was in 2012 and last lipid was in Feb 2013.

## 2013-01-04 ENCOUNTER — Other Ambulatory Visit: Payer: Self-pay

## 2013-01-04 DIAGNOSIS — I2589 Other forms of chronic ischemic heart disease: Secondary | ICD-10-CM

## 2013-01-04 DIAGNOSIS — I2510 Atherosclerotic heart disease of native coronary artery without angina pectoris: Secondary | ICD-10-CM

## 2013-01-04 NOTE — Telephone Encounter (Signed)
Labs ordered.  LM for pt to call.

## 2013-01-04 NOTE — Telephone Encounter (Signed)
Let's plan to do CBC, BMet, fasting lipid ( no liver), and TSH

## 2013-01-07 ENCOUNTER — Other Ambulatory Visit: Payer: Self-pay | Admitting: Internal Medicine

## 2013-01-07 ENCOUNTER — Ambulatory Visit (INDEPENDENT_AMBULATORY_CARE_PROVIDER_SITE_OTHER): Payer: Medicare Other | Admitting: *Deleted

## 2013-01-07 DIAGNOSIS — I2589 Other forms of chronic ischemic heart disease: Secondary | ICD-10-CM

## 2013-01-07 DIAGNOSIS — Z4502 Encounter for adjustment and management of automatic implantable cardiac defibrillator: Secondary | ICD-10-CM

## 2013-01-07 DIAGNOSIS — I255 Ischemic cardiomyopathy: Secondary | ICD-10-CM

## 2013-01-08 ENCOUNTER — Other Ambulatory Visit (INDEPENDENT_AMBULATORY_CARE_PROVIDER_SITE_OTHER): Payer: BC Managed Care – PPO

## 2013-01-08 DIAGNOSIS — I2510 Atherosclerotic heart disease of native coronary artery without angina pectoris: Secondary | ICD-10-CM

## 2013-01-08 DIAGNOSIS — I2589 Other forms of chronic ischemic heart disease: Secondary | ICD-10-CM

## 2013-01-08 DIAGNOSIS — I10 Essential (primary) hypertension: Secondary | ICD-10-CM

## 2013-01-08 LAB — CBC WITH DIFFERENTIAL/PLATELET
Basophils Absolute: 0 10*3/uL (ref 0.0–0.1)
Basophils Relative: 0.8 % (ref 0.0–3.0)
Eosinophils Absolute: 0.1 10*3/uL (ref 0.0–0.7)
Lymphocytes Relative: 24 % (ref 12.0–46.0)
MCHC: 33.4 g/dL (ref 30.0–36.0)
MCV: 90.7 fl (ref 78.0–100.0)
Monocytes Absolute: 0.4 10*3/uL (ref 0.1–1.0)
Neutrophils Relative %: 63.3 % (ref 43.0–77.0)
Platelets: 181 10*3/uL (ref 150.0–400.0)
RBC: 4.51 Mil/uL (ref 4.22–5.81)
RDW: 13.5 % (ref 11.5–14.6)

## 2013-01-08 LAB — LIPID PANEL
Cholesterol: 185 mg/dL (ref 0–200)
HDL: 73.2 mg/dL (ref >39.00)
LDL Cholesterol: 86 mg/dL (ref 0–99)
Triglycerides: 129 mg/dL (ref 0.0–149.0)
VLDL: 25.8 mg/dL (ref 0.0–40.0)

## 2013-01-08 LAB — BASIC METABOLIC PANEL
Chloride: 104 mEq/L (ref 96–112)
GFR: 80.59 mL/min (ref >60.00)
Potassium: 4.3 mEq/L (ref 3.5–5.1)
Sodium: 138 mEq/L (ref 135–145)

## 2013-01-08 NOTE — Telephone Encounter (Signed)
N/A.  Mrs [name] states he is on his way in for labs.

## 2013-01-08 NOTE — Telephone Encounter (Signed)
Pt rtn your call and you can reach him at same number

## 2013-01-08 NOTE — Telephone Encounter (Signed)
LMTC

## 2013-01-09 ENCOUNTER — Ambulatory Visit (INDEPENDENT_AMBULATORY_CARE_PROVIDER_SITE_OTHER): Payer: BC Managed Care – PPO | Admitting: Ophthalmology

## 2013-01-10 LAB — REMOTE ICD DEVICE
BATTERY VOLTAGE: 2.96 V
BRDY-0002RV: 40 {beats}/min
CHARGE TIME: 8.24 s
DEV-0020ICD: NEGATIVE
RV LEAD IMPEDENCE ICD: 528 Ohm
TZAT-0001FASTVT: 1
TZAT-0004FASTVT: 8
TZAT-0004SLOWVT: 8
TZAT-0005FASTVT: 88 pct
TZAT-0011SLOWVT: 10 ms
TZAT-0011SLOWVT: 10 ms
TZAT-0019SLOWVT: 8 V
TZAT-0019SLOWVT: 8 V
TZAT-0020FASTVT: 1.6 ms
TZAT-0020SLOWVT: 1.6 ms
TZAT-0020SLOWVT: 1.6 ms
TZON-0008SLOWVT: 0 ms
TZON-0011AFLUTTER: 70
TZST-0001FASTVT: 2
TZST-0001FASTVT: 4
TZST-0001SLOWVT: 5
TZST-0003FASTVT: 25 J
TZST-0003FASTVT: 35 J
TZST-0003FASTVT: 35 J
TZST-0003FASTVT: 35 J
TZST-0003SLOWVT: 25 J
TZST-0003SLOWVT: 35 J
VENTRICULAR PACING ICD: 0 pct

## 2013-01-12 ENCOUNTER — Encounter: Payer: Self-pay | Admitting: *Deleted

## 2013-01-14 ENCOUNTER — Encounter: Payer: Self-pay | Admitting: Cardiology

## 2013-01-14 ENCOUNTER — Ambulatory Visit (INDEPENDENT_AMBULATORY_CARE_PROVIDER_SITE_OTHER): Payer: BC Managed Care – PPO | Admitting: Cardiology

## 2013-01-14 VITALS — BP 146/90 | HR 72 | Ht 72.0 in | Wt 200.0 lb

## 2013-01-14 DIAGNOSIS — I2510 Atherosclerotic heart disease of native coronary artery without angina pectoris: Secondary | ICD-10-CM

## 2013-01-14 DIAGNOSIS — I779 Disorder of arteries and arterioles, unspecified: Secondary | ICD-10-CM

## 2013-01-14 DIAGNOSIS — I10 Essential (primary) hypertension: Secondary | ICD-10-CM

## 2013-01-14 DIAGNOSIS — I255 Ischemic cardiomyopathy: Secondary | ICD-10-CM

## 2013-01-14 DIAGNOSIS — I2589 Other forms of chronic ischemic heart disease: Secondary | ICD-10-CM

## 2013-01-14 MED ORDER — VALSARTAN 160 MG PO TABS: 160.0000 mg | ORAL_TABLET | Freq: Every day | ORAL | Status: DC

## 2013-01-14 NOTE — Assessment & Plan Note (Signed)
The patient has no significant symptoms. In the past I have reviewed carefully whether spironolactone should be added to his medication list. He has had multiple different medical problems and there are multiple drug interactions. I've chosen not to use spironolactone in his case. He does need a higher dose of his ARB for his hypertension.

## 2013-01-14 NOTE — Assessment & Plan Note (Addendum)
His blood pressures running higher today than I would like to see. Diovan will be increased from 80 mg 160 mg daily.

## 2013-01-14 NOTE — Progress Notes (Signed)
HPI   Patient is seen today for cardiology followup. I am followed him for many years. He has coronary disease and left ventricular dysfunction. He has an ICD in place it is being followed carefully. He is post CABG. His last cath in 2008 reveal that his grafts were patent. Ejection fraction was in the 25-30% range. He has been receiving medications for his prostate cancer. He's not having any chest pain or shortness of breath. He is fully active.  No Known Allergies  Current Outpatient Prescriptions  Medication Sig Dispense Refill  . aspirin 81 MG EC tablet Take 81 mg by mouth daily.        Marland Kitchen atorvastatin (LIPITOR) 80 MG tablet Take 1 tablet (80 mg total) by mouth daily.  90 tablet  0  . busPIRone (BUSPAR) 10 MG tablet Take 20 mg by mouth daily.       . carvedilol (COREG CR) 80 MG 24 hr capsule Take 1 capsule (80 mg total) by mouth daily.  90 capsule  11  . DIOVAN 80 MG tablet TAKE 1 TABLET DAILY.  90 tablet  3  . EPINEPHrine (EPIPEN) 0.3 mg/0.3 mL DEVI Inject 0.3 mLs (0.3 mg total) into the muscle as needed.  2 Device  0  . escitalopram (LEXAPRO) 20 MG tablet Take 20 mg by mouth daily.        Marland Kitchen lisdexamfetamine (VYVANSE) 50 MG capsule Take 100 mg by mouth every morning.      . Multiple Vitamins-Minerals (MULTIVITAMIN,TX-MINERALS) tablet Take 1 tablet by mouth daily.        . Multiple Vitamins-Minerals (OCUVITE PRESERVISION) TABS Take by mouth daily.       Marland Kitchen NEXIUM 40 MG capsule TAKE ONE CAPSULE BY MOUTH DAILY BEFORE BREAKFAST  90 capsule  0  . NON FORMULARY Joint Juice: 8 oz daily        No current facility-administered medications for this visit.    History   Social History  . Marital Status: Married    Spouse Name: N/A    Number of Children: 0  . Years of Education: N/A   Occupational History  . 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 Smoking status: Never Smoker   . Smokeless tobacco: Not on file  . Alcohol Use: 14.4 oz/week    24  Cans of beer per week  . Drug Use: No  . Sexually Active: Yes    Birth Control/ Protection: None   Other Topics Concern  . Not on file   Social History Narrative   UCD   [employer] - 4 years   Married '75   No children   Marriage in good health, wife in good health    Family History  Problem Relation Age of Onset  . Alzheimer's disease Mother   . Dementia Mother     living in [place]  . Hyperlipidemia Father   . Hypertension Father   . Heart disease Father     CAD, CHF, had pcemaker  . Heart disease Brother     Bypass surgery  . Colon cancer Neg Hx   . Prostate cancer Neg Hx   . Diabetes Neg Hx   . Breast cancer Sister     survivor    Past Medical History  Diagnosis Date  . Adult ADHD   . Malignant neoplasm of tonsil   . Cardiomyopathy, ischemic     EF (09) 25%,  ICD Placed 2008 (epidoses of sustained monomorphic VT in the past)  . Esophageal stricture   . Dyspepsia   . CAD (coronary artery disease)     Cath 2008, all grafts patent,   . HTN (hypertension)   . Hyperlipidemia   . GERD (gastroesophageal reflux disease)   . Thrombosis of arm     Left  . Elevated PSA 02/15/2011  . ICD (implantable cardiac defibrillator) battery depletion     Placed 2008  (eepisodes  nonsustained monomorphic VT in the past)  . Hx of CABG     1991  . Venous thrombosis     Left arm  . Carotid artery disease     Doppler, February, 2012, mild plaque, 0-39% bilateral, plan followup in one year    Past Surgical History  Procedure Laterality Date  . Coronary artery bypass graft      LIMA_LAD, SVG-OM, Cx, RCA '91  . Rotator cuff repair  2005  . Tonsillectomy  2005    Malignant lesion, w/XRT  . Cystectomy      Hand  . Icd placement    . Thumb surgery    . 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 Cardiac catheterization  12/05/06    Patient Active Problem List  Diagnosis  . MALIGNANT NEOPLASM OF TONSIL  . ATTENTION DEFICIT  HYPERACTIVITY DISORDER, ADULT  . SINUSITIS- ACUTE-NOS  . ESOPHAGEAL STRICTURE  . DYSPEPSIA, HX OF  . Elevated PSA  . Prostate cancer  . Cardiomyopathy, ischemic  . CAD (coronary artery disease)  . HTN (hypertension)  . Hyperlipidemia  . ICD-Medtronic  . Hx of CABG  . Venous thrombosis  . Carotid artery disease    ROS   Patient denies fever, chills, headache, sweats, rash, change in vision, change in hearing, chest pain, cough, nausea vomiting, urinary symptoms. All other systems are reviewed and are negative.  PHYSICAL EXAM  Patient is oriented to person time and place. Affect is normal. There is no jugulovenous distention. Lungs are clear. Respiratory effort is nonlabored. Cardiac exam reveals S1 and S2. There no clicks or significant murmurs. The abdomen is soft. Is no peripheral edema. There no musculoskeletal deformities. There are no skin rashes.  Filed Vitals:   01/14/13 0957  BP: 146/90  Pulse: 72  Height: 6' (1.829 m)  Weight: 200 lb (90.719 kg)   EKG is done today and reviewed by me. There is sinus rhythm. There is an interventricular conduction delay that is old. There is no significant change.  ASSESSMENT & PLAN

## 2013-01-14 NOTE — Assessment & Plan Note (Addendum)
He has mild carotid disease. It is now time for followup carotid Doppler in the two-year range.  As part of today's evaluation I spent greater than 25 minutes with his overall care. More than half of this has been in reviewing his situation with his physical exam and discussing all issues with him.

## 2013-01-14 NOTE — Assessment & Plan Note (Signed)
Coronary disease is stable. No change in therapy. 

## 2013-01-14 NOTE — Patient Instructions (Addendum)
Your physician has requested that you have a carotid duplex. This test is an ultrasound of the carotid arteries in your neck. It looks at blood flow through these arteries that supply the brain with blood. Allow one hour for this exam. There are no restrictions or special instructions.  Your physician has recommended you make the following change in your medication: INCREASE your Diovan to 160 mg  Your physician wants you to follow-up in: 1 year.   You will receive a reminder letter in the mail two months in advance. If you don't receive a letter, please call our office to schedule the follow-up appointment.

## 2013-01-21 ENCOUNTER — Telehealth: Payer: Self-pay | Admitting: Internal Medicine

## 2013-01-21 ENCOUNTER — Encounter: Payer: Self-pay | Admitting: Internal Medicine

## 2013-01-21 NOTE — Telephone Encounter (Signed)
Spoke w/pt and advised pt was sent letter in error. Transmission was received and processed. No letter should have been sent/kwm

## 2013-01-21 NOTE — Telephone Encounter (Signed)
New problem   Pt stated he sent his device phone transmission for your pacemaker check on 01/07/13 but he received letter that it wasn't sent. Pt want to talk to someone about this.

## 2013-01-24 ENCOUNTER — Ambulatory Visit (INDEPENDENT_AMBULATORY_CARE_PROVIDER_SITE_OTHER): Payer: BC Managed Care – PPO | Admitting: Ophthalmology

## 2013-01-29 ENCOUNTER — Ambulatory Visit (INDEPENDENT_AMBULATORY_CARE_PROVIDER_SITE_OTHER): Payer: Medicare Other | Admitting: Ophthalmology

## 2013-01-29 DIAGNOSIS — H2510 Age-related nuclear cataract, unspecified eye: Secondary | ICD-10-CM

## 2013-01-29 DIAGNOSIS — H33009 Unspecified retinal detachment with retinal break, unspecified eye: Secondary | ICD-10-CM

## 2013-01-29 DIAGNOSIS — H35039 Hypertensive retinopathy, unspecified eye: Secondary | ICD-10-CM

## 2013-01-29 DIAGNOSIS — H3530 Unspecified macular degeneration: Secondary | ICD-10-CM

## 2013-01-29 DIAGNOSIS — I10 Essential (primary) hypertension: Secondary | ICD-10-CM

## 2013-01-29 DIAGNOSIS — H43819 Vitreous degeneration, unspecified eye: Secondary | ICD-10-CM

## 2013-01-30 ENCOUNTER — Encounter: Payer: Self-pay | Admitting: *Deleted

## 2013-02-06 ENCOUNTER — Other Ambulatory Visit: Payer: Self-pay

## 2013-02-06 ENCOUNTER — Encounter: Payer: Self-pay | Admitting: Internal Medicine

## 2013-02-06 DIAGNOSIS — I5022 Chronic systolic (congestive) heart failure: Secondary | ICD-10-CM

## 2013-02-06 MED ORDER — CARVEDILOL PHOSPHATE ER 80 MG PO CP24: 80.0000 mg | ORAL_CAPSULE | Freq: Every day | ORAL | Status: DC

## 2013-02-28 ENCOUNTER — Encounter: Payer: Medicare Other | Admitting: Internal Medicine

## 2013-02-28 ENCOUNTER — Encounter: Payer: Self-pay | Admitting: Cardiology

## 2013-02-28 ENCOUNTER — Ambulatory Visit (INDEPENDENT_AMBULATORY_CARE_PROVIDER_SITE_OTHER): Payer: BC Managed Care – PPO | Admitting: Cardiology

## 2013-02-28 VITALS — BP 138/82 | HR 72 | Ht 72.0 in | Wt 199.0 lb

## 2013-02-28 DIAGNOSIS — I472 Ventricular tachycardia, unspecified: Secondary | ICD-10-CM

## 2013-02-28 DIAGNOSIS — Z95810 Presence of automatic (implantable) cardiac defibrillator: Secondary | ICD-10-CM

## 2013-02-28 DIAGNOSIS — I255 Ischemic cardiomyopathy: Secondary | ICD-10-CM

## 2013-02-28 DIAGNOSIS — I5022 Chronic systolic (congestive) heart failure: Secondary | ICD-10-CM

## 2013-02-28 DIAGNOSIS — I2589 Other forms of chronic ischemic heart disease: Secondary | ICD-10-CM

## 2013-02-28 LAB — ICD DEVICE OBSERVATION
FVT: 0
RV LEAD IMPEDENCE ICD: 536 Ohm
RV LEAD THRESHOLD: 2.5 V
TZAT-0001SLOWVT: 1
TZAT-0001SLOWVT: 2
TZAT-0004FASTVT: 8
TZAT-0005SLOWVT: 84 pct
TZAT-0005SLOWVT: 91 pct
TZAT-0011SLOWVT: 10 ms
TZAT-0011SLOWVT: 10 ms
TZAT-0012FASTVT: 200 ms
TZAT-0019SLOWVT: 8 V
TZAT-0020FASTVT: 1.6 ms
TZON-0003FASTVT: 240 ms
TZON-0011AFLUTTER: 70
TZST-0001FASTVT: 2
TZST-0001FASTVT: 4
TZST-0001SLOWVT: 3
TZST-0001SLOWVT: 4
TZST-0003FASTVT: 25 J
TZST-0003FASTVT: 35 J
TZST-0003FASTVT: 35 J
TZST-0003SLOWVT: 25 J
TZST-0003SLOWVT: 35 J
VF: 0

## 2013-02-28 NOTE — Progress Notes (Signed)
ELECTROPHYSIOLOGY OFFICE NOTE  Patient ID: [name] MRN: [id number], DOB/AGE: 05-28-1946   Date of Visit: 02/28/2013  Primary Physician: Illene Regulus, MD Primary Cardiologist: Myrtis Ser, MD Primary EP: [name], MD Reason for Visit: EP/device follow-up  History of Present Illness  [name] is a 67 year old man with an ICM s/p ICD implant, chronic systolic HF, paroxysmal VT and CAD who presents today for routine electrophysiology followup. Since last being seen in our clinic, he reports he is doing well. He has no complaints. Today, he specifically denies chest pain or shortness of breath. He denies palpitations, dizziness, near syncope or syncope. He denies LE swelling, orthopnea, PND or recent weight gain. He denies ICD shocks. [name] reports he is compliant and tolerating medications without difficulty.  Past Medical History Past Medical History  Diagnosis Date  . Adult ADHD   . Malignant neoplasm of tonsil   . 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 Esophageal stricture   . Dyspepsia   . CAD (coronary artery disease)     Cath 2008, all grafts patent,   . HTN (hypertension)   . Hyperlipidemia   . GERD (gastroesophageal reflux disease)   . Thrombosis of arm     Left  . Elevated PSA 02/15/2011  . ICD (implantable cardiac defibrillator) battery depletion     Placed 2008  (eepisodes  nonsustained monomorphic VT in the past)  . Hx of CABG     1991  . Venous thrombosis     Left arm  . Carotid artery disease     Doppler, February, 2012, mild plaque, 0-39% bilateral, plan followup in one year    Past Surgical History Past Surgical History  Procedure Laterality Date  . Coronary artery bypass graft      LIMA_LAD, SVG-OM, Cx, RCA '91  . Rotator cuff repair  2005  . Tonsillectomy  2005    Malignant lesion, w/XRT  . Cystectomy      Hand  . Icd placement    . Thumb surgery    . Hand  contracture release      bilateral release of Dupyrten's contracture: '10 and '11  . Cardiac catheterization  12/05/06     Allergies/Intolerances No Known Allergies  Current Home Medications Current Outpatient Prescriptions  Medication Sig Dispense Refill  . aspirin 81 MG EC tablet Take 81 mg by mouth daily.        Marland Kitchen atorvastatin (LIPITOR) 80 MG tablet Take 1 tablet (80 mg total) by mouth daily.  90 tablet  0  . busPIRone (BUSPAR) 10 MG tablet Take 20 mg by mouth daily.       . carvedilol (COREG CR) 80 MG 24 hr capsule Take 1 capsule (80 mg total) by mouth daily.  90 capsule  3  . EPINEPHrine (EPIPEN) 0.3 mg/0.3 mL DEVI Inject 0.3 mLs (0.3 mg total) into the muscle as needed.  2 Device  0  . escitalopram (LEXAPRO) 20 MG tablet Take 20 mg by mouth daily.        Marland Kitchen lisdexamfetamine (VYVANSE) 50 MG capsule Take 100 mg by mouth every morning.      . Multiple Vitamins-Minerals (MULTIVITAMIN,TX-MINERALS) tablet Take 1 tablet by mouth daily.        . Multiple Vitamins-Minerals (OCUVITE PRESERVISION) TABS Take by mouth daily.       Marland Kitchen NEXIUM 40 MG capsule TAKE ONE CAPSULE BY MOUTH DAILY  BEFORE BREAKFAST  90 capsule  0  . NON FORMULARY Joint Juice: 8 oz daily       . valsartan (DIOVAN) 160 MG tablet Take 1 tablet (160 mg total) by mouth daily.  90 tablet  3   No current facility-administered medications for this visit.    Social History Social History  . Marital Status: Married   Occupational History  . 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 Smoking status: Never Smoker   . Smokeless tobacco: No  . Alcohol Use: 14.4 oz/week    24 Cans of beer per week  . Drug Use: No   Social History Financial trader - 4 years   Married 1975   No children    Review of Systems General: No chills, fever, night sweats or weight changes Cardiovascular: No chest pain, dyspnea on exertion, edema, orthopnea, palpitations, paroxysmal nocturnal  dyspnea Dermatological: No rash, lesions or masses Respiratory: No cough, dyspnea Urologic: No hematuria, dysuria Abdominal: No nausea, vomiting, diarrhea, bright red blood per rectum, melena, or hematemesis Neurologic: No visual changes, weakness, changes in mental status All other systems reviewed and are otherwise negative except as noted above.  Physical Exam Blood pressure 138/82, pulse 72, height 6' (1.829 m), weight 199 lb (90.266 kg), SpO2 98.00%.  General: Well developed, well appearing 67 year old male in no acute distress year old male in no acute distress. HEENT: Normocephalic, atraumatic. EOMs intact. Sclera nonicteric. Oropharynx clear.  Neck: Supple. No JVD. Lungs: Respirations regular and unlabored, CTA bilaterally. No wheezes, rales or rhonchi. Heart: RRR. S1, S2 present. No murmurs, rub, S3 or S4. Abdomen: Soft, non-distended. Extremities: No clubbing, cyanosis or edema. DP/PT/Radials 2+ and equal bilaterally. Psych: Normal affect. Neuro: Alert and oriented X 3. Moves all extremities spontaneously.   Diagnostics Device interrogation today - Normal device function. Thresholds and sensing consistent with previous device measurements. Impedance trends stable over time. 9 NST episodes, longest 8 beats, no EGMs. Otherwise no evidence of any ventricular arrhythmias. No changes made this session.   Assessment and Plan 1. ICM s/p ICD implant Normal device function No programming changes made Continue routine remote device follow-up every 3 months Return to clinic for follow-up with Dr. [name] in one year 2. Paroxysmal VT No sustained arrhythmias or tachy therapies delivered by device interrogation  Continue BB 3. Chronic systolic HF Stable; euvolemic by exam Continue medical therapy  Signed, Rick Duff, PA-C 02/28/2013, 1:21 PM

## 2013-02-28 NOTE — Patient Instructions (Addendum)
Remote monitoring is used to monitor your Pacemaker of ICD from home. This monitoring reduces the number of office visits required to check your device to one time per year. It allows us to keep an eye on the functioning of your device to ensure it is working properly. You are scheduled for a device check from home on 06/03/13. You may send your transmission at any time that day. If you have a wireless device, the transmission will be sent automatically. After your physician reviews your transmission, you will receive a postcard with your next transmission date.  Your physician wants you to follow-up in: 1 year with Dr Taylor You will receive a reminder letter in the mail two months in advance. If you don't receive a letter, please call our office to schedule the follow-up appointment.  

## 2013-03-04 ENCOUNTER — Other Ambulatory Visit: Payer: Self-pay | Admitting: Internal Medicine

## 2013-03-06 ENCOUNTER — Other Ambulatory Visit: Payer: Self-pay | Admitting: Internal Medicine

## 2013-03-07 ENCOUNTER — Other Ambulatory Visit: Payer: Self-pay | Admitting: *Deleted

## 2013-03-07 MED ORDER — ATORVASTATIN CALCIUM 80 MG PO TABS: 80.0000 mg | ORAL_TABLET | Freq: Every day | ORAL | Status: DC

## 2013-03-19 ENCOUNTER — Encounter (INDEPENDENT_AMBULATORY_CARE_PROVIDER_SITE_OTHER): Payer: BC Managed Care – PPO

## 2013-03-19 DIAGNOSIS — I779 Disorder of arteries and arterioles, unspecified: Secondary | ICD-10-CM

## 2013-03-19 DIAGNOSIS — I6529 Occlusion and stenosis of unspecified carotid artery: Secondary | ICD-10-CM

## 2013-03-27 ENCOUNTER — Encounter: Payer: Self-pay | Admitting: Internal Medicine

## 2013-03-27 ENCOUNTER — Encounter: Payer: Self-pay | Admitting: Cardiology

## 2013-06-03 ENCOUNTER — Other Ambulatory Visit: Payer: Self-pay | Admitting: Internal Medicine

## 2013-06-03 ENCOUNTER — Ambulatory Visit (INDEPENDENT_AMBULATORY_CARE_PROVIDER_SITE_OTHER): Payer: Medicare Other | Admitting: *Deleted

## 2013-06-03 DIAGNOSIS — Z4502 Encounter for adjustment and management of automatic implantable cardiac defibrillator: Secondary | ICD-10-CM

## 2013-06-03 DIAGNOSIS — I255 Ischemic cardiomyopathy: Secondary | ICD-10-CM

## 2013-06-03 DIAGNOSIS — I2589 Other forms of chronic ischemic heart disease: Secondary | ICD-10-CM

## 2013-06-04 LAB — REMOTE ICD DEVICE
BATTERY VOLTAGE: 2.89 V
BRDY-0002RV: 40 {beats}/min
RV LEAD AMPLITUDE: 16.7 mv
TZAT-0001FASTVT: 1
TZAT-0004FASTVT: 8
TZAT-0011SLOWVT: 10 ms
TZAT-0011SLOWVT: 10 ms
TZAT-0012FASTVT: 200 ms
TZAT-0012SLOWVT: 200 ms
TZAT-0012SLOWVT: 200 ms
TZAT-0013FASTVT: 1
TZAT-0018FASTVT: NEGATIVE
TZAT-0018SLOWVT: NEGATIVE
TZAT-0018SLOWVT: NEGATIVE
TZAT-0019FASTVT: 8 V
TZAT-0019SLOWVT: 8 V
TZAT-0019SLOWVT: 8 V
TZAT-0020FASTVT: 1.6 ms
TZAT-0020SLOWVT: 1.6 ms
TZAT-0020SLOWVT: 1.6 ms
TZON-0003FASTVT: 240 ms
TZON-0005SLOWVT: 12
TZON-0008FASTVT: 0 ms
TZON-0008SLOWVT: 0 ms
TZON-0011AFLUTTER: 70
TZST-0001FASTVT: 2
TZST-0001FASTVT: 4
TZST-0001SLOWVT: 4
TZST-0001SLOWVT: 5
TZST-0003FASTVT: 35 J
TZST-0003FASTVT: 35 J
TZST-0003FASTVT: 35 J
TZST-0003SLOWVT: 35 J
TZST-0003SLOWVT: 35 J

## 2013-06-07 ENCOUNTER — Encounter: Payer: Self-pay | Admitting: *Deleted

## 2013-06-14 ENCOUNTER — Encounter: Payer: Self-pay | Admitting: Internal Medicine

## 2013-07-03 ENCOUNTER — Other Ambulatory Visit: Payer: Self-pay

## 2013-08-29 ENCOUNTER — Other Ambulatory Visit: Payer: Self-pay | Admitting: Internal Medicine

## 2013-09-09 ENCOUNTER — Ambulatory Visit (INDEPENDENT_AMBULATORY_CARE_PROVIDER_SITE_OTHER): Payer: Medicare Other | Admitting: *Deleted

## 2013-09-09 DIAGNOSIS — I2589 Other forms of chronic ischemic heart disease: Secondary | ICD-10-CM

## 2013-09-09 DIAGNOSIS — I255 Ischemic cardiomyopathy: Secondary | ICD-10-CM

## 2013-09-10 LAB — REMOTE ICD DEVICE
BATTERY VOLTAGE: 2.82 V
BRDY-0002RV: 40 {beats}/min
DEV-0020ICD: NEGATIVE
RV LEAD AMPLITUDE: 16.6 mv
TZAT-0001FASTVT: 1
TZAT-0001SLOWVT: 1
TZAT-0001SLOWVT: 2
TZAT-0011SLOWVT: 10 ms
TZAT-0011SLOWVT: 10 ms
TZAT-0012FASTVT: 200 ms
TZAT-0013FASTVT: 1
TZAT-0018SLOWVT: NEGATIVE
TZAT-0018SLOWVT: NEGATIVE
TZAT-0019FASTVT: 8 V
TZAT-0019SLOWVT: 8 V
TZAT-0019SLOWVT: 8 V
TZAT-0020FASTVT: 1.6 ms
TZON-0003FASTVT: 240 ms
TZON-0005SLOWVT: 12
TZON-0008FASTVT: 0 ms
TZON-0008SLOWVT: 0 ms
TZST-0001FASTVT: 4
TZST-0001FASTVT: 6
TZST-0001SLOWVT: 4
TZST-0003FASTVT: 25 J
TZST-0003FASTVT: 35 J
TZST-0003FASTVT: 35 J
TZST-0003SLOWVT: 35 J
TZST-0003SLOWVT: 35 J
TZST-0003SLOWVT: 35 J

## 2013-09-18 ENCOUNTER — Encounter: Payer: Self-pay | Admitting: *Deleted

## 2013-09-30 ENCOUNTER — Other Ambulatory Visit: Payer: Self-pay | Admitting: Internal Medicine

## 2013-10-03 ENCOUNTER — Other Ambulatory Visit: Payer: Self-pay

## 2013-10-04 ENCOUNTER — Encounter: Payer: Self-pay | Admitting: Internal Medicine

## 2013-10-28 ENCOUNTER — Other Ambulatory Visit: Payer: Self-pay | Admitting: Internal Medicine

## 2013-11-25 ENCOUNTER — Other Ambulatory Visit: Payer: Self-pay | Admitting: Internal Medicine

## 2013-12-10 ENCOUNTER — Ambulatory Visit (INDEPENDENT_AMBULATORY_CARE_PROVIDER_SITE_OTHER): Payer: Medicare Other | Admitting: Internal Medicine

## 2013-12-10 ENCOUNTER — Encounter: Payer: Self-pay | Admitting: Internal Medicine

## 2013-12-10 VITALS — BP 140/90 | HR 73 | Temp 97.8°F | Ht 72.0 in | Wt 198.4 lb

## 2013-12-10 DIAGNOSIS — F909 Attention-deficit hyperactivity disorder, unspecified type: Secondary | ICD-10-CM

## 2013-12-10 DIAGNOSIS — I2589 Other forms of chronic ischemic heart disease: Secondary | ICD-10-CM

## 2013-12-10 DIAGNOSIS — I255 Ischemic cardiomyopathy: Secondary | ICD-10-CM

## 2013-12-10 DIAGNOSIS — C61 Malignant neoplasm of prostate: Secondary | ICD-10-CM

## 2013-12-10 DIAGNOSIS — Z8719 Personal history of other diseases of the digestive system: Secondary | ICD-10-CM

## 2013-12-10 DIAGNOSIS — I2510 Atherosclerotic heart disease of native coronary artery without angina pectoris: Secondary | ICD-10-CM

## 2013-12-10 DIAGNOSIS — E785 Hyperlipidemia, unspecified: Secondary | ICD-10-CM

## 2013-12-10 DIAGNOSIS — Z0000 Encounter for general adult medical examination without abnormal findings: Secondary | ICD-10-CM

## 2013-12-10 DIAGNOSIS — I10 Essential (primary) hypertension: Secondary | ICD-10-CM

## 2013-12-10 DIAGNOSIS — Z7189 Other specified counseling: Secondary | ICD-10-CM | POA: Insufficient documentation

## 2013-12-10 DIAGNOSIS — R972 Elevated prostate specific antigen [PSA]: Secondary | ICD-10-CM

## 2013-12-10 MED ORDER — ESOMEPRAZOLE MAGNESIUM 40 MG PO CPDR: DELAYED_RELEASE_CAPSULE | ORAL | Status: DC

## 2013-12-10 NOTE — Progress Notes (Signed)
Pre visit review using our clinic review tool, if applicable. No additional management support is needed unless otherwise documented below in the visit note. 

## 2013-12-10 NOTE — Patient Instructions (Signed)
Thanks for coming in. Your exam is good. Routine labs are ordered and results will be posted to MyChart.  Health maintenance - you are current with colorectal cancer screening. If you change your mind about immunizations let us know.  Advanced Care Planning - if you execute the documents a copy can be put in your chart. Check out "[email or web address]."

## 2013-12-10 NOTE — Progress Notes (Signed)
Subjective:    Patient ID: [name], male    DOB: 02-17-1946, 68 y.o.   MRN: [id number]  HPI The patient is here for annual Medicare wellness examination and management of other chronic and acute problems.  Interval history - unremarkable. He has seen cardiology recently (Dr. Ron Parker) and got a good report.    The risk factors are reflected in the social history.  The roster of all physicians providing medical care to patient - is listed in the Snapshot section of the chart.  Activities of daily living:  The patient is 100% inedpendent in all ADLs: dressing, toileting, feeding as well as independent mobility  Home safety : The patient has smoke detectors in the home. Falls - none.  They wear seatbelts.  firearms are present in the home, kept in a safe fashion. There is no violence in the home.   There is no risks for hepatitis, STDs or HIV. There is no history of blood transfusion. They have no travel history to infectious disease endemic areas of the world.  The patient has  seen their dentist in the last six month. They have seen their eye doctor in the last year, early catracts. They deny any hearing difficulty and have not had audiologic testing in the last year.    They do not  have excessive sun exposure. Discussed the need for sun protection: hats, long sleeves and use of sunscreen if there is significant sun exposure.   Diet: the importance of a healthy diet is discussed. They do have a healthy diet.  The patient has no regular exercise program.  The benefits of regular aerobic exercise were discussed.  Depression screen: there are no signs or vegative symptoms of depression- irritability, change in appetite, anhedonia, sadness/tearfullness.  Cognitive assessment: the patient manages all their financial and personal affairs and is actively engaged.   The following portions of the patient's history were reviewed and updated as appropriate: allergies, current  medications, past family history, past medical history,  past surgical history, past social history  and problem list.  Vision, hearing, body mass index were assessed and reviewed.   During the course of the visit the patient was educated and counseled about appropriate screening and preventive services including : fall prevention , diabetes screening, nutrition counseling, colorectal cancer screening, and recommended immunizations.  Past Medical History  Diagnosis Date  . Adult ADHD   . Malignant neoplasm of tonsil   . 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 Esophageal stricture   . Dyspepsia   . CAD (coronary artery disease)     Cath 2008, all grafts patent,   . HTN (hypertension)   . Hyperlipidemia   . GERD (gastroesophageal reflux disease)   . Thrombosis of arm     Left  . Elevated PSA 02/15/2011  . ICD (implantable cardiac defibrillator) battery depletion     Placed 2008  (eepisodes  nonsustained monomorphic VT in the past)  . Hx of CABG     1991  . Venous thrombosis     Left arm  . Carotid artery disease     Doppler, February, 2012, mild plaque, 0-39% bilateral, plan followup in one year   Past Surgical History  Procedure Laterality Date  . Coronary artery bypass graft      LIMA_LAD, SVG-OM, Cx, RCA '91  . Rotator cuff repair  2005  . Tonsillectomy  2005  Malignant lesion, w/XRT  . Cystectomy      Hand  . Icd placement    . Thumb surgery    . 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 Cardiac catheterization  12/05/06   Family History  Problem Relation Age of Onset  . Alzheimer's disease Mother   . Dementia Mother     living in [place]  . Hyperlipidemia Father   . Hypertension Father   . Heart disease Father     CAD, CHF, had pcemaker  . Heart disease Brother     Bypass surgery  . Colon cancer Neg Hx   . Prostate cancer Neg Hx   . Diabetes  Neg Hx   . Breast cancer Sister     survivor   History   Social History  . Marital Status: Married    Spouse Name: N/A    Number of Children: 0  . Years of Education: N/A   Occupational History  . 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 Smoking status: Never Smoker   . Smokeless tobacco: Not on file  . Alcohol Use: 14.4 oz/week    24 Cans of beer per week  . Drug Use: No  . Sexual Activity: Yes    Birth Control/ Protection: None   Other Topics Concern  . Not on file   Social History Narrative   UCD   [employer]   [employer] - 4 years   Married '75   No children   Marriage in good health, wife in good health       Review of Systems Constitutional:  Negative for fever, chills, activity change and unexpected weight change.  HEENT:  Negative for hearing loss, ear pain, congestion, neck stiffness and postnasal drip. Negative for sore throat or swallowing problems. Negative for dental complaints.   Eyes: Negative for vision loss or change in visual acuity.  Respiratory: Negative for chest tightness and wheezing. Negative for DOE.   Cardiovascular: Negative for chest pain or palpitations. No decreased exercise tolerance Gastrointestinal: No change in bowel habit. No bloating or gas. No reflux or indigestion Genitourinary: Negative for urgency, frequency, flank pain and difficulty urinating.  Musculoskeletal: Negative for myalgias, back pain, arthralgias and gait problem.  Neurological: Negative for dizziness, tremors, weakness and headaches.  Hematological: Negative for adenopathy.  Psychiatric/Behavioral: Negative for behavioral problems and dysphoric mood.       Objective:   Physical Exam Filed Vitals:   12/10/13 1023  BP: 140/90  Pulse: 73  Temp: 97.8 F (36.6 C)   Wt Readings from Last 3 Encounters:  12/10/13 198 lb 6.4 oz (89.994 kg)  02/28/13 199 lb (90.266 kg)  01/14/13 200 lb (90.719 kg)   Gen'l: Well nourished well  developed male in no acute distress  HEENT: Head: Normocephalic and atraumatic. Right Ear: External ear normal. EAC/TM nl. Left Ear: External ear normal.  EAC/TM nl. Nose: Nose normal. Mouth/Throat: Oropharynx is clear and moist. Dentition - native, in good repair. No buccal or palatal lesions. Posterior pharynx clear. Eyes: Conjunctivae and sclera clear. EOM intact. Pupils are equal, round, and reactive to light. Right eye exhibits no discharge. Left eye exhibits no discharge. Neck: Normal range of motion. Neck supple. No JVD present. No tracheal deviation present. No thyromegaly present.  Cardiovascular: Normal rate, regular rhythm, no gallop, no friction rub, no murmur heard.  Quiet precordium. 2+ radial and DP pulses . No carotid bruits Pulmonary/Chest: Effort normal. No respiratory distress or increased WOB, no wheezes, no rales. No chest wall deformity or CVAT. Abdomen: Soft. Bowel sounds are normal in all quadrants. He exhibits no distension, no tenderness, no rebound or guarding, No heptosplenomegaly  Genitourinary: deferred  Musculoskeletal: Normal range of motion. He exhibits no edema and no tenderness.       Small and large joints without redness, synovial thickening or deformity. Full range of motion preserved about all small, median and large joints.  Lymphadenopathy:    He has no cervical or supraclavicular adenopathy.  Neurological: He is alert and oriented to person, place, and time. CN II-XII intact. DTRs 2+ and symmetrical biceps, radial and patellar tendons. Cerebellar function normal with no tremor, rigidity, normal gait and station.  Skin: Skin is warm and dry. No rash noted. No erythema.  Psychiatric: He has a normal mood and affect. His behavior is normal. Thought content normal.   Lab from Jan 08, 2013 reviewed: normal Bmet, TC 185, HDL 73.2, LDL 86, CBC nl, TSH 1.77           Assessment & Plan:

## 2013-12-11 ENCOUNTER — Encounter: Payer: Self-pay | Admitting: Internal Medicine

## 2013-12-11 DIAGNOSIS — Z0000 Encounter for general adult medical examination without abnormal findings: Secondary | ICD-10-CM | POA: Insufficient documentation

## 2013-12-11 NOTE — Assessment & Plan Note (Signed)
[name] follows with Dr. Alinda Money - last exam and PSA August '14 with PSA of 0.24, no evidence for recurrent malignancy  Plan Per Dr. Alinda Money

## 2013-12-11 NOTE — Assessment & Plan Note (Signed)
Appears to be stable on current med table.

## 2013-12-11 NOTE — Assessment & Plan Note (Signed)
Last lab Feb '14 - great control.

## 2013-12-11 NOTE — Assessment & Plan Note (Signed)
Mr. Portner with a complex medical history has had a quiet interval. Physical exam-brief is normal. Labs from Feb '14 reviewed. He has no colonoscopy on record and does not wish to schedule study. He is s/p radiation treatment for prostate cancer and was stable as of August '14. Immunizations - declines pneumonia vaccines and shingles vaccine.  In summary - a pleasant man with a complex history who appears to be doing well.

## 2013-12-11 NOTE — Assessment & Plan Note (Signed)
ACP discussed Jan '15: no CPR, no mechanical ventilation. He is referred to [email or web address] and provided with packet with HCPOA-his wife./ Advanced directive.

## 2013-12-11 NOTE — Assessment & Plan Note (Signed)
Symptoms are adequately controlled with Nexium.  Plan Rx renewed

## 2013-12-11 NOTE — Assessment & Plan Note (Signed)
Current with cardiology/EP follow up and he is stable.

## 2013-12-11 NOTE — Assessment & Plan Note (Signed)
Current with cardiology/EP follow up and he is stable.  

## 2013-12-13 ENCOUNTER — Ambulatory Visit (INDEPENDENT_AMBULATORY_CARE_PROVIDER_SITE_OTHER): Payer: Medicare Other | Admitting: *Deleted

## 2013-12-13 DIAGNOSIS — I255 Ischemic cardiomyopathy: Secondary | ICD-10-CM

## 2013-12-13 DIAGNOSIS — I2589 Other forms of chronic ischemic heart disease: Secondary | ICD-10-CM

## 2013-12-18 LAB — MDC_IDC_ENUM_SESS_TYPE_REMOTE
Brady Statistic RV Percent Paced: 0 %
HIGH POWER IMPEDANCE MEASURED VALUE: 46 Ohm
Lead Channel Impedance Value: 488 Ohm
Lead Channel Setting Pacing Pulse Width: 0.4 ms
Lead Channel Setting Sensing Sensitivity: 0.3 mV
MDC IDC MSMT BATTERY VOLTAGE: 2.76 V
MDC IDC MSMT LEADCHNL RV SENSING INTR AMPL: 16.2 mV
MDC IDC SESS DTM: 20150116170100
MDC IDC SET LEADCHNL RV PACING AMPLITUDE: 2.5 V
Zone Setting Detection Interval: 240 ms
Zone Setting Detection Interval: 310 ms
Zone Setting Detection Interval: 400 ms

## 2013-12-24 ENCOUNTER — Encounter: Payer: Self-pay | Admitting: *Deleted

## 2013-12-27 ENCOUNTER — Encounter: Payer: Self-pay | Admitting: Internal Medicine

## 2014-01-14 ENCOUNTER — Other Ambulatory Visit: Payer: Self-pay | Admitting: Cardiology

## 2014-01-16 ENCOUNTER — Encounter: Payer: Self-pay | Admitting: Internal Medicine

## 2014-01-31 ENCOUNTER — Ambulatory Visit (INDEPENDENT_AMBULATORY_CARE_PROVIDER_SITE_OTHER): Payer: Medicare Other | Admitting: Ophthalmology

## 2014-01-31 DIAGNOSIS — H35039 Hypertensive retinopathy, unspecified eye: Secondary | ICD-10-CM

## 2014-01-31 DIAGNOSIS — I10 Essential (primary) hypertension: Secondary | ICD-10-CM

## 2014-01-31 DIAGNOSIS — H2510 Age-related nuclear cataract, unspecified eye: Secondary | ICD-10-CM

## 2014-01-31 DIAGNOSIS — Q143 Congenital malformation of choroid: Secondary | ICD-10-CM

## 2014-01-31 DIAGNOSIS — H3530 Unspecified macular degeneration: Secondary | ICD-10-CM

## 2014-01-31 DIAGNOSIS — H43819 Vitreous degeneration, unspecified eye: Secondary | ICD-10-CM

## 2014-02-18 ENCOUNTER — Other Ambulatory Visit: Payer: Self-pay | Admitting: Cardiology

## 2014-02-28 ENCOUNTER — Other Ambulatory Visit: Payer: Self-pay

## 2014-03-03 ENCOUNTER — Other Ambulatory Visit: Payer: Self-pay | Admitting: Cardiology

## 2014-03-04 NOTE — Telephone Encounter (Signed)
This encounter was created in error - please disregard.

## 2014-03-05 ENCOUNTER — Encounter: Payer: Self-pay | Admitting: Internal Medicine

## 2014-03-05 ENCOUNTER — Ambulatory Visit (INDEPENDENT_AMBULATORY_CARE_PROVIDER_SITE_OTHER): Payer: Medicare Other | Admitting: Internal Medicine

## 2014-03-05 VITALS — BP 158/94 | HR 61 | Ht 72.0 in | Wt 200.0 lb

## 2014-03-05 DIAGNOSIS — I472 Ventricular tachycardia, unspecified: Secondary | ICD-10-CM

## 2014-03-05 DIAGNOSIS — Z4502 Encounter for adjustment and management of automatic implantable cardiac defibrillator: Secondary | ICD-10-CM

## 2014-03-05 DIAGNOSIS — I255 Ischemic cardiomyopathy: Secondary | ICD-10-CM

## 2014-03-05 DIAGNOSIS — I2589 Other forms of chronic ischemic heart disease: Secondary | ICD-10-CM

## 2014-03-05 DIAGNOSIS — I4729 Other ventricular tachycardia: Secondary | ICD-10-CM

## 2014-03-05 LAB — MDC_IDC_ENUM_SESS_TYPE_INCLINIC
Battery Voltage: 2.71 V
Date Time Interrogation Session: 20150408125934
HIGH POWER IMPEDANCE MEASURED VALUE: 41 Ohm
HIGH POWER IMPEDANCE MEASURED VALUE: 41 Ohm
HIGH POWER IMPEDANCE MEASURED VALUE: 44 Ohm
HIGH POWER IMPEDANCE MEASURED VALUE: 44 Ohm
HIGH POWER IMPEDANCE MEASURED VALUE: 44 Ohm
HIGH POWER IMPEDANCE MEASURED VALUE: 45 Ohm
HIGH POWER IMPEDANCE MEASURED VALUE: 45 Ohm
HIGH POWER IMPEDANCE MEASURED VALUE: 46 Ohm
HIGH POWER IMPEDANCE MEASURED VALUE: 49 Ohm
HIGH POWER IMPEDANCE MEASURED VALUE: 52 Ohm
HIGH POWER IMPEDANCE MEASURED VALUE: 52 Ohm
HIGH POWER IMPEDANCE MEASURED VALUE: 53 Ohm
HIGH POWER IMPEDANCE MEASURED VALUE: 53 Ohm
HIGH POWER IMPEDANCE MEASURED VALUE: 55 Ohm
HIGH POWER IMPEDANCE MEASURED VALUE: 56 Ohm
HIGH POWER IMPEDANCE MEASURED VALUE: 57 Ohm
HighPow Impedance: 39 Ohm
HighPow Impedance: 39 Ohm
HighPow Impedance: 40 Ohm
HighPow Impedance: 40 Ohm
HighPow Impedance: 42 Ohm
HighPow Impedance: 42 Ohm
HighPow Impedance: 43 Ohm
HighPow Impedance: 43 Ohm
HighPow Impedance: 49 Ohm
HighPow Impedance: 51 Ohm
HighPow Impedance: 51 Ohm
HighPow Impedance: 51 Ohm
HighPow Impedance: 53 Ohm
HighPow Impedance: 53 Ohm
HighPow Impedance: 56 Ohm
Lead Channel Impedance Value: 488 Ohm
Lead Channel Impedance Value: 488 Ohm
Lead Channel Impedance Value: 504 Ohm
Lead Channel Impedance Value: 512 Ohm
Lead Channel Impedance Value: 512 Ohm
Lead Channel Impedance Value: 512 Ohm
Lead Channel Impedance Value: 520 Ohm
Lead Channel Impedance Value: 528 Ohm
Lead Channel Impedance Value: 528 Ohm
Lead Channel Impedance Value: 576 Ohm
Lead Channel Pacing Threshold Amplitude: 1 V
Lead Channel Pacing Threshold Pulse Width: 0.2 ms
Lead Channel Sensing Intrinsic Amplitude: 15.7 mV
Lead Channel Sensing Intrinsic Amplitude: 15.8 mV
Lead Channel Sensing Intrinsic Amplitude: 16.2 mV
Lead Channel Sensing Intrinsic Amplitude: 16.2 mV
Lead Channel Sensing Intrinsic Amplitude: 16.2 mV
Lead Channel Sensing Intrinsic Amplitude: 16.3 mV
Lead Channel Sensing Intrinsic Amplitude: 16.3 mV
Lead Channel Sensing Intrinsic Amplitude: 16.4 mV
Lead Channel Sensing Intrinsic Amplitude: 16.5 mV
Lead Channel Sensing Intrinsic Amplitude: 16.7 mV
Lead Channel Setting Pacing Amplitude: 2.5 V
Lead Channel Setting Sensing Sensitivity: 0.3 mV
MDC IDC MSMT LEADCHNL RV IMPEDANCE VALUE: 496 Ohm
MDC IDC MSMT LEADCHNL RV IMPEDANCE VALUE: 496 Ohm
MDC IDC MSMT LEADCHNL RV IMPEDANCE VALUE: 496 Ohm
MDC IDC MSMT LEADCHNL RV IMPEDANCE VALUE: 496 Ohm
MDC IDC MSMT LEADCHNL RV IMPEDANCE VALUE: 536 Ohm
MDC IDC MSMT LEADCHNL RV SENSING INTR AMPL: 16.4 mV
MDC IDC MSMT LEADCHNL RV SENSING INTR AMPL: 16.6 mV
MDC IDC MSMT LEADCHNL RV SENSING INTR AMPL: 16.7 mV
MDC IDC MSMT LEADCHNL RV SENSING INTR AMPL: 16.7 mV
MDC IDC MSMT LEADCHNL RV SENSING INTR AMPL: 16.8 mV
MDC IDC SET LEADCHNL RV PACING PULSEWIDTH: 0.4 ms
MDC IDC SET ZONE DETECTION INTERVAL: 400 ms
MDC IDC STAT BRADY RV PERCENT PACED: 0 %
Zone Setting Detection Interval: 240 ms
Zone Setting Detection Interval: 310 ms

## 2014-03-05 NOTE — Progress Notes (Signed)
HPI [name] returns today for followup. He is a very pleasant middle-age man with an ischemic cardiomyopathy, chronic systolic heart failure, and ventricular tachycardia. He is status post ICD implantation. He denies any recent ICD shocks. No peripheral edema, chest pain, no shortness of breath, no syncope. His device has never gone off. He notes some hot flashes after taking Lupron. Allergies  Allergen Reactions  . Other Anaphylaxis    Insect bites (Has EpiPen)     Current Outpatient Prescriptions  Medication Sig Dispense Refill  . amphetamine-dextroamphetamine (ADDERALL XR) 20 MG 24 hr capsule Take 1 capsule by mouth daily.      Marland Kitchen aspirin 81 MG EC tablet Take 81 mg by mouth daily.        Marland Kitchen atorvastatin (LIPITOR) 80 MG tablet TAKE 1 TABLET (80 MG TOTAL) BY MOUTH DAILY.  90 tablet  0  . busPIRone (BUSPAR) 30 MG tablet Take 30 mg by mouth daily.      . carvedilol (COREG CR) 80 MG 24 hr capsule Take 1 capsule (80 mg total) by mouth daily.  90 capsule  3  . EPINEPHrine (EPIPEN) 0.3 mg/0.3 mL DEVI Inject 0.3 mLs (0.3 mg total) into the muscle as needed.  2 Device  0  . escitalopram (LEXAPRO) 20 MG tablet Take 20 mg by mouth daily.        Marland Kitchen esomeprazole (NEXIUM) 40 MG capsule TAKE 1 CAPSULE BY MOUTH EVERY MORNING BEFORE BREAKFAST  90 capsule  3  . Multiple Vitamins-Minerals (MULTIVITAMIN,TX-MINERALS) tablet Take 1 tablet by mouth daily.        . Multiple Vitamins-Minerals (OCUVITE PRESERVISION) TABS Take 1 tablet by mouth daily.       . NON FORMULARY Joint Juice: 8 oz daily       . valsartan (DIOVAN) 160 MG tablet TAKE 1 TABLET (160 MG TOTAL) BY MOUTH DAILY.  90 tablet  3  . zolpidem (AMBIEN) 10 MG tablet Take 1 tablet by mouth as needed.       No current facility-administered medications for this visit.     Past Medical History  Diagnosis Date  . Adult ADHD   . Malignant neoplasm of tonsil   . Cardiomyopathy, ischemic     EF (09) 25%, ICD Placed 2008 (epidoses of sustained  monomorphic VT in the past)  . Esophageal stricture   . Dyspepsia   . CAD (coronary artery disease)     Cath 2008, all grafts patent,   . HTN (hypertension)   . Hyperlipidemia   . GERD (gastroesophageal reflux disease)   . Thrombosis of arm     Left  . Elevated PSA 02/15/2011  . ICD (implantable cardiac defibrillator) battery depletion     Placed 2008  (eepisodes  nonsustained monomorphic VT in the past)  . Hx of CABG     1991  . Venous thrombosis     Left arm  . Carotid artery disease     Doppler, February, 2012, mild plaque, 0-39% bilateral, plan followup in one year  . Prostate cancer 2012    IMRT, ADT    ROS:   All systems reviewed and negative except as noted in the HPI.   Past Surgical History  Procedure Laterality Date  . Coronary artery bypass graft      LIMA_LAD, SVG-OM, Cx, RCA '91  . Rotator cuff repair  2005, 2007  . Tonsillectomy  2005    Malignant lesion, w/XRT  . Cystectomy      Hand  . Icd  placement    . Thumb surgery    . 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 Cardiac catheterization  12/05/06  . Menistectomies, left knee  '09     Family History  Problem Relation Age of Onset  . Alzheimer's disease Mother   . Dementia Mother     living in [place]  . Hyperlipidemia Father   . Hypertension Father   . Heart disease Father     CAD, CHF, had pcemaker  . Heart disease Brother     Bypass surgery  . Colon cancer Neg Hx   . Prostate cancer Neg Hx   . Diabetes Neg Hx   . Breast cancer Sister     survivor     History   Social History  . Marital Status: Married    Spouse Name: N/A    Number of Children: 0  . Years of Education: N/A   Occupational History  . 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 Smoking status: Never Smoker   . Smokeless tobacco: Not on file  . Alcohol Use: 14.4 oz/week    24 Cans of beer per week  . Drug Use: No  . Sexual Activity:  Yes    Birth Control/ Protection: None   Other Topics Concern  . Not on file   Social History Narrative   [employer], [employer]. 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 Marriage in good health, wife in good health. ACP discussed Jan '15: no CPR, no mechanical ventilation. He is referred to [email or web address] and provided with packet with HCPOA-his wife./ Advanced directive.     BP 158/94  Pulse 61  Ht 6' (1.829 m)  Wt 200 lb (90.719 kg)  BMI 27.12 kg/m2  Physical Exam:  Well appearing middle-aged man, NAD HEENT: Unremarkable Neck:  No JVD, no thyromegally Lungs:  Clear with no wheezes, rales, or rhonchi. HEART:  Regular rate rhythm, no murmurs, no rubs, no clicks Abd:  soft, positive bowel sounds, no organomegally, no rebound, no guarding Ext:  2 plus pulses, no edema, no cyanosis, no clubbing Skin:  No rashes no nodules Neuro:  CN II through XII intact, motor grossly intact  DEVICE  Normal device function.  See PaceArt for details.   Assess/Plan:

## 2014-03-05 NOTE — Assessment & Plan Note (Signed)
The patient last had sustained monomorphic VT 8 years ago and has had none since. No change in meds.

## 2014-03-05 NOTE — Assessment & Plan Note (Signed)
He denies anginal symptoms. He will continue his current meds.  

## 2014-03-05 NOTE — Assessment & Plan Note (Signed)
His device is working normally. Will recheck in several months. 

## 2014-03-05 NOTE — Patient Instructions (Signed)
Your physician wants you to follow-up in: 12 months with Dr Knox Saliva will receive a reminder letter in the mail two months in advance. If you don't receive a letter, please call our office to schedule the follow-up appointment.    Remote monitoring is used to monitor your Pacemaker or ICD from home. This monitoring reduces the number of office visits required to check your device to one time per year. It allows [place] to keep an eye on the functioning of your device to ensure it is working properly. You are scheduled for a device check from home on 06/09/14. You may send your transmission at any time that day. If you have a wireless device, the transmission will be sent automatically. After your physician reviews your transmission, you will receive a postcard with your next transmission date.

## 2014-03-19 ENCOUNTER — Encounter: Payer: Self-pay | Admitting: Internal Medicine

## 2014-03-21 ENCOUNTER — Ambulatory Visit (INDEPENDENT_AMBULATORY_CARE_PROVIDER_SITE_OTHER): Payer: Medicare Other | Admitting: Cardiology

## 2014-03-21 ENCOUNTER — Encounter: Payer: Self-pay | Admitting: Cardiology

## 2014-03-21 VITALS — BP 158/90 | HR 63 | Ht 72.0 in | Wt 200.0 lb

## 2014-03-21 DIAGNOSIS — I10 Essential (primary) hypertension: Secondary | ICD-10-CM

## 2014-03-21 DIAGNOSIS — I2510 Atherosclerotic heart disease of native coronary artery without angina pectoris: Secondary | ICD-10-CM

## 2014-03-21 DIAGNOSIS — F909 Attention-deficit hyperactivity disorder, unspecified type: Secondary | ICD-10-CM

## 2014-03-21 DIAGNOSIS — I779 Disorder of arteries and arterioles, unspecified: Secondary | ICD-10-CM

## 2014-03-21 DIAGNOSIS — E785 Hyperlipidemia, unspecified: Secondary | ICD-10-CM

## 2014-03-21 DIAGNOSIS — I2589 Other forms of chronic ischemic heart disease: Secondary | ICD-10-CM

## 2014-03-21 DIAGNOSIS — I255 Ischemic cardiomyopathy: Secondary | ICD-10-CM

## 2014-03-21 DIAGNOSIS — I472 Ventricular tachycardia, unspecified: Secondary | ICD-10-CM

## 2014-03-21 DIAGNOSIS — I739 Peripheral vascular disease, unspecified: Secondary | ICD-10-CM

## 2014-03-21 DIAGNOSIS — I4729 Other ventricular tachycardia: Secondary | ICD-10-CM

## 2014-03-21 NOTE — Patient Instructions (Signed)
Your physician recommends that you continue on your current medications as directed. Please refer to the Current Medication list given to you today.  Your physician wants you to follow-up in: 6 months. You will receive a reminder letter in the mail two months in advance. If you don't receive a letter, please call our office to schedule the follow-up appointment.  Please check your blood pressure once or twice daily for 1 week then call Jeani Hawking, Dr Kae Heller nurse, to report your readings at [phone].

## 2014-03-21 NOTE — Assessment & Plan Note (Signed)
The patient is on high-dose statin therapy.

## 2014-03-21 NOTE — Assessment & Plan Note (Signed)
He has been on high-dose carvedilol and ARB. I have not used spironolactone in the past because of other complicating medical issues. I may consider this over the next few months.

## 2014-03-21 NOTE — Assessment & Plan Note (Signed)
His Doppler in April, 2014 showed slight disease. The plan is for a 2 year followup.

## 2014-03-21 NOTE — Assessment & Plan Note (Signed)
Over the years the patient has required careful attention to this problem. Today he mentioned to his concern about whether his ICD would be replaced in the future. He mentioned that he cannot deal well with gray zones. Considering all issues, I suspect it will be best that he have his unit upgraded when it comes to end-of-life.

## 2014-03-21 NOTE — Assessment & Plan Note (Signed)
Coronary disease is stable. I've chosen not to proceed with exercise testing at this time. He has no symptoms.

## 2014-03-21 NOTE — Assessment & Plan Note (Signed)
His blood pressure is higher not want. He had just taken his medicines. His pressure was slightly elevated when he saw Dr. Lovena Le recently. We will have him check his pressures at home and send the information back to me. At that time I will consider the possibility of doubling his valsartan dose. In the past I have not used spironolactone because of many other conflicting medications that he is on. However I may consider spironolactone.

## 2014-03-21 NOTE — Assessment & Plan Note (Signed)
The patient had ventricular tachycardia 8 years ago. His ICD has shown no recurrence since then. Dr. Lovena Le mentioned to him that he and Dr. Lovena Le will have to decide if his ICD should be upgraded at the end of life of his current unit. It is my concern that the patient will have significant anxiety if this unit is not replaced. This will be kept in mind as decisions are being made.  As part of today's evaluation I spent greater than 25 minutes with is total care. More than half of this time was with direct contact with the patient. We had a long and careful discussion about his blood pressure therapy in about the approach to his ICD in the future.

## 2014-03-21 NOTE — Progress Notes (Signed)
Patient ID: [name], male   DOB: Oct 05, 1946, 68 y.o.   MRN: [id number]    HPI  The patient is seen today to followup coronary disease and hypertension. I saw him last February, 2014. He has been seen by Dr. Lovena Le for follow up of his ICD. The patient tells me that he is end-of-life for his ICD will be in the next one to 2 years.   Allergies  Allergen Reactions  . Other Anaphylaxis    Insect bites (Has EpiPen)    Current Outpatient Prescriptions  Medication Sig Dispense Refill  . amphetamine-dextroamphetamine (ADDERALL XR) 20 MG 24 hr capsule Take 1 capsule by mouth daily.      Marland Kitchen aspirin 81 MG EC tablet Take 81 mg by mouth daily.        Marland Kitchen atorvastatin (LIPITOR) 80 MG tablet TAKE 1 TABLET (80 MG TOTAL) BY MOUTH DAILY.  90 tablet  0  . busPIRone (BUSPAR) 30 MG tablet Take 30 mg by mouth daily.      . carvedilol (COREG CR) 80 MG 24 hr capsule Take 1 capsule (80 mg total) by mouth daily.  90 capsule  3  . EPINEPHrine (EPIPEN) 0.3 mg/0.3 mL DEVI Inject 0.3 mLs (0.3 mg total) into the muscle as needed.  2 Device  0  . escitalopram (LEXAPRO) 20 MG tablet Take 20 mg by mouth daily.        Marland Kitchen esomeprazole (NEXIUM) 40 MG capsule TAKE 1 CAPSULE BY MOUTH EVERY MORNING BEFORE BREAKFAST  90 capsule  3  . Multiple Vitamins-Minerals (MULTIVITAMIN,TX-MINERALS) tablet Take 1 tablet by mouth daily.        . Multiple Vitamins-Minerals (OCUVITE PRESERVISION) TABS Take 1 tablet by mouth daily.       . NON FORMULARY Joint Juice: 8 oz daily       . valsartan (DIOVAN) 160 MG tablet TAKE 1 TABLET (160 MG TOTAL) BY MOUTH DAILY.  90 tablet  3  . zolpidem (AMBIEN) 10 MG tablet Take 1 tablet by mouth as needed.       No current facility-administered medications for this visit.    History   Social History  . Marital Status: Married    Spouse Name: N/A    Number of Children: 0  . Years of Education: N/A   Occupational History  . 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 Smoking status: Never Smoker   . Smokeless tobacco: Not on file  . Alcohol Use: 14.4 oz/week    24 Cans of beer per week  . Drug Use: No  . Sexual Activity: Yes    Birth Control/ Protection: None   Other Topics Concern  . Not on file   Social History Narrative   [employer], [employer]. 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 Marriage in good health, wife in good health. ACP discussed Jan '15: no CPR, no mechanical ventilation. He is referred to [email or web address] and provided with packet with HCPOA-his wife./ Advanced directive.    Family History  Problem Relation Age of Onset  . Alzheimer's disease Mother   . Dementia Mother     living in [place]  . Hyperlipidemia Father   . Hypertension Father   . Heart disease Father     CAD, CHF, had pcemaker  . Heart disease Brother     Bypass surgery  . Colon cancer Neg Hx   . Prostate cancer Neg Hx   .  Diabetes Neg Hx   . Breast cancer Sister     survivor    Past Medical History  Diagnosis Date  . Adult ADHD   . Malignant neoplasm of tonsil   . 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 Esophageal stricture   . Dyspepsia   . CAD (coronary artery disease)     Cath 2008, all grafts patent,   . HTN (hypertension)   . Hyperlipidemia   . GERD (gastroesophageal reflux disease)   . Thrombosis of arm     Left  . Elevated PSA 02/15/2011  . ICD (implantable cardiac defibrillator) battery depletion     Placed 2008  (eepisodes  nonsustained monomorphic VT in the past)  . Hx of CABG     1991  . Venous thrombosis     Left arm  . Carotid artery disease     Doppler, February, 2012, mild plaque, 0-39% bilateral, plan followup in one year  . Prostate cancer 2012    IMRT, ADT    Past Surgical History  Procedure Laterality Date  . Coronary artery bypass graft      LIMA_LAD, SVG-OM, Cx, RCA '91  . Rotator cuff repair  2005, 2007  . Tonsillectomy  2005     Malignant lesion, w/XRT  . Cystectomy      Hand  . Icd placement    . Thumb surgery    . 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 Cardiac catheterization  12/05/06  . Menistectomies, left knee  '09    Patient Active Problem List   Diagnosis Date Noted  . Ventricular tachycardia 03/05/2014  . Routine health maintenance 12/11/2013  . Advanced care planning/counseling discussion 12/10/2013  . Cardiomyopathy, ischemic   . CAD (coronary artery disease)   . HTN (hypertension)   . Hyperlipidemia   . Encounter for servicing of automatic implantable cardioverter-defibrillator (AICD) at end of battery life   . Hx of CABG   . Carotid artery disease   . Prostate cancer 11/09/2011  . 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 ATTENTION DEFICIT HYPERACTIVITY DISORDER, ADULT 08/23/2008  . ESOPHAGEAL STRICTURE 08/23/2008  . DYSPEPSIA, HX OF 08/23/2008    ROS   Patient denies fever, chills, headache, sweats, rash, change in vision, change in hearing, chest pain, cough, nausea or vomiting, urinary symptoms. All other systems are reviewed and are negative.  PHYSICAL EXAM  Patient is oriented to person time and place. Affect is normal. In general he is doing well. He is concerned about the question of whether his ICD will be replaced in the future. Head is atraumatic. Conjunctiva are normal. There is no jugulovenous distention. Lungs are clear. Respiratory effort is nonlabored. Cardiac exam reveals S1 and S2. The abdomen is soft. There is no peripheral edema. There no musculoskeletal deformities. There are no skin rashes.  Filed Vitals:   03/21/14 0819  BP: 158/90  Pulse: 63  Height: 6' (1.829 m)  Weight: 200 lb (90.719 kg)   EKG is done today and reviewed by me. There is sinus rhythm. There is old interventricular conduction delay. There is no change from the past.  ASSESSMENT & PLAN

## 2014-05-27 ENCOUNTER — Other Ambulatory Visit: Payer: Self-pay | Admitting: Cardiology

## 2014-05-30 ENCOUNTER — Other Ambulatory Visit: Payer: Self-pay | Admitting: Cardiology

## 2014-06-09 ENCOUNTER — Encounter: Payer: Self-pay | Admitting: Internal Medicine

## 2014-06-09 ENCOUNTER — Ambulatory Visit (INDEPENDENT_AMBULATORY_CARE_PROVIDER_SITE_OTHER): Payer: Medicare Other | Admitting: *Deleted

## 2014-06-09 DIAGNOSIS — I472 Ventricular tachycardia, unspecified: Secondary | ICD-10-CM

## 2014-06-09 DIAGNOSIS — I2589 Other forms of chronic ischemic heart disease: Secondary | ICD-10-CM

## 2014-06-09 DIAGNOSIS — I4729 Other ventricular tachycardia: Secondary | ICD-10-CM

## 2014-06-09 DIAGNOSIS — I255 Ischemic cardiomyopathy: Secondary | ICD-10-CM

## 2014-06-09 LAB — MDC_IDC_ENUM_SESS_TYPE_REMOTE
Battery Voltage: 2.66 V
Date Time Interrogation Session: 20150713170700
HighPow Impedance: 44 Ohm
Lead Channel Sensing Intrinsic Amplitude: 16.2 mV
Lead Channel Setting Pacing Amplitude: 2.5 V
Lead Channel Setting Sensing Sensitivity: 0.3 mV
MDC IDC MSMT LEADCHNL RV IMPEDANCE VALUE: 512 Ohm
MDC IDC SET LEADCHNL RV PACING PULSEWIDTH: 0.4 ms
MDC IDC SET ZONE DETECTION INTERVAL: 240 ms
MDC IDC SET ZONE DETECTION INTERVAL: 400 ms
Zone Setting Detection Interval: 310 ms

## 2014-06-10 NOTE — Progress Notes (Signed)
Remote ICD transmission.   

## 2014-06-12 ENCOUNTER — Telehealth: Payer: Self-pay | Admitting: *Deleted

## 2014-06-12 NOTE — Telephone Encounter (Signed)
Informed pt of 7-7 episode where ICD shock was received. Patient was unaware of episode---no symptoms. States that he has been taking his meds as prescribed. Pt informed of driving restrictions. Will notify pt if any further instructions per GT. Patient voiced understanding.

## 2014-07-04 ENCOUNTER — Encounter: Payer: Self-pay | Admitting: Cardiology

## 2014-08-28 ENCOUNTER — Other Ambulatory Visit: Payer: Self-pay | Admitting: Cardiology

## 2014-09-10 ENCOUNTER — Telehealth: Payer: Self-pay | Admitting: Cardiology

## 2014-09-10 ENCOUNTER — Ambulatory Visit (INDEPENDENT_AMBULATORY_CARE_PROVIDER_SITE_OTHER): Payer: Medicare Other | Admitting: *Deleted

## 2014-09-10 ENCOUNTER — Encounter: Payer: Self-pay | Admitting: Internal Medicine

## 2014-09-10 DIAGNOSIS — I255 Ischemic cardiomyopathy: Secondary | ICD-10-CM

## 2014-09-10 DIAGNOSIS — I472 Ventricular tachycardia, unspecified: Secondary | ICD-10-CM

## 2014-09-10 NOTE — Telephone Encounter (Signed)
LMOVM reminding pt to send remote transmission.   

## 2014-09-10 NOTE — Progress Notes (Signed)
Remote ICD transmission.   

## 2014-09-14 LAB — MDC_IDC_ENUM_SESS_TYPE_REMOTE
Battery Voltage: 2.64 V
Brady Statistic RV Percent Paced: 0 %
Date Time Interrogation Session: 20151014180300
HighPow Impedance: 44 Ohm
Lead Channel Sensing Intrinsic Amplitude: 16.9 mV
Lead Channel Setting Pacing Amplitude: 2.5 V
Lead Channel Setting Pacing Pulse Width: 0.4 ms
MDC IDC MSMT LEADCHNL RV IMPEDANCE VALUE: 592 Ohm
MDC IDC SET LEADCHNL RV SENSING SENSITIVITY: 0.3 mV
MDC IDC SET ZONE DETECTION INTERVAL: 310 ms
Zone Setting Detection Interval: 240 ms
Zone Setting Detection Interval: 400 ms

## 2014-09-19 ENCOUNTER — Encounter: Payer: Self-pay | Admitting: *Deleted

## 2014-09-24 ENCOUNTER — Encounter: Payer: Self-pay | Admitting: Cardiology

## 2014-09-24 ENCOUNTER — Ambulatory Visit (INDEPENDENT_AMBULATORY_CARE_PROVIDER_SITE_OTHER): Payer: Medicare Other | Admitting: Cardiology

## 2014-09-24 VITALS — BP 172/108 | HR 44 | Ht 72.0 in | Wt 198.0 lb

## 2014-09-24 DIAGNOSIS — I255 Ischemic cardiomyopathy: Secondary | ICD-10-CM

## 2014-09-24 DIAGNOSIS — I2510 Atherosclerotic heart disease of native coronary artery without angina pectoris: Secondary | ICD-10-CM

## 2014-09-24 DIAGNOSIS — E785 Hyperlipidemia, unspecified: Secondary | ICD-10-CM

## 2014-09-24 DIAGNOSIS — I10 Essential (primary) hypertension: Secondary | ICD-10-CM

## 2014-09-24 MED ORDER — VALSARTAN 160 MG PO TABS: 160.0000 mg | ORAL_TABLET | Freq: Two times a day (BID) | ORAL | Status: DC

## 2014-09-24 NOTE — Patient Instructions (Signed)
Your physician has recommended you make the following change in your medication: increase Valsartan 160 mg to twice daily  Your physician recommends that you schedule a follow-up appointment in: 2 to 3 weeks.  Please check and record your blood pressure daily and bring list with you to your next office visit

## 2014-09-24 NOTE — Assessment & Plan Note (Signed)
He is on guideline directed therapy. No change.

## 2014-09-24 NOTE — Assessment & Plan Note (Signed)
His coronary status is stable. I've chosen not to proceed with stress testing.

## 2014-09-24 NOTE — Progress Notes (Signed)
Patient ID: [name], male   DOB: 1946-01-09, 68 y.o.   MRN: [id number]    HPI  Patient is seen today to follow-up cardiomyopathy and ventricular tachycardia and hypertension. The patient did have an ICD discharge since I saw him last. He did not feel the discharge. Fortunately he has been stable. It is very clear now that he has had an episode that his ICD will be replaced when it reaches end of life. He's feeling well. His blood pressure at home has been mildly elevated. It is higher here today in the office. This does appear to correlate with his wrist blood pressure cuff.  Allergies  Allergen Reactions  . Other Anaphylaxis    Insect bites (Has EpiPen)    Current Outpatient Prescriptions  Medication Sig Dispense Refill  . amphetamine-dextroamphetamine (ADDERALL XR) 20 MG 24 hr capsule Take 1 capsule by mouth daily.      Marland Kitchen aspirin 81 MG EC tablet Take 81 mg by mouth daily.        Marland Kitchen atorvastatin (LIPITOR) 80 MG tablet TAKE 1 TABLET BY MOUTH EVERY DAY  90 tablet  0  . busPIRone (BUSPAR) 30 MG tablet Take 30 mg by mouth daily.      Marland Kitchen COREG CR 80 MG 24 hr capsule TAKE ONE CAPSULE BY MOUTH DAILY  90 capsule  2  . EPINEPHrine (EPIPEN) 0.3 mg/0.3 mL DEVI Inject 0.3 mLs (0.3 mg total) into the muscle as needed.  2 Device  0  . escitalopram (LEXAPRO) 20 MG tablet Take 20 mg by mouth daily.        Marland Kitchen esomeprazole (NEXIUM) 40 MG capsule TAKE 1 CAPSULE BY MOUTH EVERY MORNING BEFORE BREAKFAST  90 capsule  3  . Multiple Vitamins-Minerals (MULTIVITAMIN,TX-MINERALS) tablet Take 1 tablet by mouth daily.        . Multiple Vitamins-Minerals (OCUVITE PRESERVISION) TABS Take 1 tablet by mouth daily.       . NON FORMULARY Joint Juice: 8 oz daily       . valsartan (DIOVAN) 160 MG tablet TAKE 1 TABLET (160 MG TOTAL) BY MOUTH DAILY.  90 tablet  3  . zolpidem (AMBIEN) 10 MG tablet Take 1 tablet by mouth as needed.       No current facility-administered medications for this visit.    History    Social History  . Marital Status: Married    Spouse Name: N/A    Number of Children: 0  . Years of Education: N/A   Occupational History  . 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 Smoking status: Never Smoker   . Smokeless tobacco: Not on file  . Alcohol Use: 14.4 oz/week    24 Cans of beer per week  . Drug Use: No  . Sexual Activity: Yes    Birth Control/ Protection: None   Other Topics Concern  . Not on file   Social History Narrative   [employer], [employer]. 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 Marriage in good health, wife in good health. ACP discussed Jan '15: no CPR, no mechanical ventilation. He is referred to [email or web address] and provided with packet with HCPOA-his wife./ Advanced directive.    Family History  Problem Relation Age of Onset  . Alzheimer's disease Mother   . Dementia Mother     living in [place]  . Hyperlipidemia Father   . Hypertension Father   . Heart disease Father  CAD, CHF, had pcemaker  . Heart disease Brother     Bypass surgery  . Colon cancer Neg Hx   . Prostate cancer Neg Hx   . Diabetes Neg Hx   . Breast cancer Sister     survivor    Past Medical History  Diagnosis Date  . Adult ADHD   . Malignant neoplasm of tonsil   . 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 Esophageal stricture   . Dyspepsia   . CAD (coronary artery disease)     Cath 2008, all grafts patent,   . HTN (hypertension)   . Hyperlipidemia   . GERD (gastroesophageal reflux disease)   . Thrombosis of arm     Left  . Elevated PSA 02/15/2011  . ICD (implantable cardiac defibrillator) battery depletion     Placed 2008  (eepisodes  nonsustained monomorphic VT in the past)  . Hx of CABG     1991  . Venous thrombosis     Left arm  . Carotid artery disease     Doppler, February, 2012, mild plaque, 0-39% bilateral, plan followup in one year  .  Prostate cancer 2012    IMRT, ADT    Past Surgical History  Procedure Laterality Date  . Coronary artery bypass graft      LIMA_LAD, SVG-OM, Cx, RCA '91  . Rotator cuff repair  2005, 2007  . Tonsillectomy  2005    Malignant lesion, w/XRT  . Cystectomy      Hand  . Icd placement    . Thumb surgery    . 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 Cardiac catheterization  12/05/06  . Menistectomies, left knee  '09    Patient Active Problem List   Diagnosis Date Noted  . Ventricular tachycardia 03/05/2014  . Routine health maintenance 12/11/2013  . Advanced care planning/counseling discussion 12/10/2013  . Cardiomyopathy, ischemic   . CAD (coronary artery disease)   . HTN (hypertension)   . Hyperlipidemia   . Encounter for servicing of automatic implantable cardioverter-defibrillator (AICD) at end of battery life   . Hx of CABG   . Carotid artery disease   . Prostate cancer 11/09/2011  . 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 ATTENTION DEFICIT HYPERACTIVITY DISORDER, ADULT 08/23/2008  . ESOPHAGEAL STRICTURE 08/23/2008  . DYSPEPSIA, HX OF 08/23/2008    ROS   Patient denies fever, chills, headache, sweats, rash, change in vision, change in hearing, chest pain, cough, nausea or vomiting, urinary symptoms. All other systems are reviewed and are negative.  PHYSICAL EXAM  Patient is oriented to person time and place. Affect is normal. Head is atraumatic. Sclera and conjunctiva are normal. There is no jugular venous distention. Lungs are clear. Respiratory effort is not labored. Cardiac exam reveals an S1 and S2. The abdomen is soft. There is no peripheral edema. There are no musculoskeletal deformities. There are no skin rashes.  Filed Vitals:   09/24/14 0836  BP: 172/108  Pulse: 44  Height: 6' (1.829 m)  Weight: 198 lb (89.812 kg)  SpO2: 98%     ASSESSMENT & PLAN

## 2014-09-24 NOTE — Assessment & Plan Note (Signed)
The patient has been on appropriate medications. Because he is on such a complicated list of medicines have not used spell like told in the past. I will consider this in the very near future.

## 2014-09-24 NOTE — Assessment & Plan Note (Signed)
It appears that his blood pressure is definitely elevated at home. It was higher here today. His home blood pressure cuff does appear to correlate. I will be increasing his valsartan from 160 once a day to 160 twice a day. He will check his pressure at home. I'll see him for an early follow-up. The next step will be to consider spironolactone.

## 2014-10-06 ENCOUNTER — Ambulatory Visit (INDEPENDENT_AMBULATORY_CARE_PROVIDER_SITE_OTHER): Payer: Medicare Other | Admitting: Cardiology

## 2014-10-06 ENCOUNTER — Encounter: Payer: Self-pay | Admitting: Cardiology

## 2014-10-06 VITALS — BP 152/102 | HR 64 | Ht 72.0 in | Wt 199.0 lb

## 2014-10-06 DIAGNOSIS — I10 Essential (primary) hypertension: Secondary | ICD-10-CM

## 2014-10-06 MED ORDER — VALSARTAN 160 MG PO TABS: 160.0000 mg | ORAL_TABLET | Freq: Every day | ORAL | Status: DC

## 2014-10-06 MED ORDER — AMLODIPINE BESYLATE 5 MG PO TABS: 5.0000 mg | ORAL_TABLET | Freq: Every day | ORAL | Status: DC

## 2014-10-06 NOTE — Patient Instructions (Signed)
**Note De-Identified  Obfuscation** Your physician has recommended you make the following change in your medication: increase Valsartan back to taking once daily and start taking Amlodipine 5 mg daily  Your physician recommends that you schedule a follow-up appointment in: 3 to 4 weeks  Your physician has requested that you regularly monitor and record your blood pressure readings at home. Please use the same machine at the same time of day to check your readings and record them to bring to your follow-up visit. You may either call Jeani Hawking at [phone] or send your blood pressure readings through [facility] in 2 weeks.  Jeani Hawking will be getting in touch with you concerning who your primary care physician will be.

## 2014-10-06 NOTE — Assessment & Plan Note (Signed)
I had increased his valsartan. This has not improved his blood pressure. I considered whether we should add a diuretic or amlodipine. I'm hesitant to start with a diuretic as I do not want to take any chance of electrolyte abnormalities. Therefore we will start amlodipine 5 mg daily. He will check his pressure at home and I will see him back in the range of 2-3 weeks.

## 2014-10-06 NOTE — Progress Notes (Signed)
Patient ID: [name], male   DOB: 1946-01-24, 68 y.o.   MRN: [id number]    HPI  the patient is seen today to follow-up his hypertension. I saw him recently and his blood pressure was higher than I was comfortable with. I increase his valsartan to twice daily. This has not had any significant effect on his blood pressure. He has been recording it at home.  Allergies  Allergen Reactions  . Other Anaphylaxis    Insect bites (Has EpiPen)    Current Outpatient Prescriptions  Medication Sig Dispense Refill  . amphetamine-dextroamphetamine (ADDERALL XR) 20 MG 24 hr capsule Take 1 capsule by mouth daily.    Marland Kitchen aspirin 81 MG EC tablet Take 81 mg by mouth daily.      Marland Kitchen atorvastatin (LIPITOR) 80 MG tablet TAKE 1 TABLET BY MOUTH EVERY DAY 90 tablet 0  . busPIRone (BUSPAR) 30 MG tablet Take 30 mg by mouth daily.    Marland Kitchen COREG CR 80 MG 24 hr capsule TAKE ONE CAPSULE BY MOUTH DAILY 90 capsule 2  . EPINEPHrine (EPIPEN) 0.3 mg/0.3 mL DEVI Inject 0.3 mLs (0.3 mg total) into the muscle as needed. 2 Device 0  . escitalopram (LEXAPRO) 20 MG tablet Take 20 mg by mouth daily.      Marland Kitchen esomeprazole (NEXIUM) 40 MG capsule TAKE 1 CAPSULE BY MOUTH EVERY MORNING BEFORE BREAKFAST 90 capsule 3  . Multiple Vitamins-Minerals (MULTIVITAMIN,TX-MINERALS) tablet Take 1 tablet by mouth daily.      . Multiple Vitamins-Minerals (OCUVITE PRESERVISION) TABS Take 1 tablet by mouth daily.     . NON FORMULARY Joint Juice: 8 oz daily     . valsartan (DIOVAN) 160 MG tablet Take 1 tablet (160 mg total) by mouth 2 (two) times daily. 60 tablet 3  . zolpidem (AMBIEN) 10 MG tablet Take 1 tablet by mouth at bedtime as needed for sleep.      No current facility-administered medications for this visit.    History   Social History  . Marital Status: Married    Spouse Name: N/A    Number of Children: 0  . Years of Education: N/A   Occupational History  . 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 Smoking status: Never Smoker   . Smokeless tobacco: Not on file  . Alcohol Use: 14.4 oz/week    24 Cans of beer per week  . Drug Use: No  . Sexual Activity: Yes    Birth Control/ Protection: None   Other Topics Concern  . Not on file   Social History Narrative   [employer]. 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 Marriage in good health, wife in good health. ACP discussed Jan '15: no CPR, no mechanical ventilation. He is referred to [email or web address] and provided with packet with HCPOA-his wife./ Advanced directive.    Family History  Problem Relation Age of Onset  . Alzheimer's disease Mother   . Dementia Mother     living in [place]  . Hyperlipidemia Father   . Hypertension Father   . Heart disease Father     CAD, CHF, had pcemaker  . Heart disease Brother     Bypass surgery  . Colon cancer Neg Hx   . Prostate cancer Neg Hx   . Diabetes Neg Hx   . Breast cancer Sister     survivor    Past Medical History  Diagnosis Date  . Adult  ADHD   . Malignant neoplasm of tonsil   . 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 Esophageal stricture   . Dyspepsia   . CAD (coronary artery disease)     Cath 2008, all grafts patent,   . HTN (hypertension)   . Hyperlipidemia   . GERD (gastroesophageal reflux disease)   . Thrombosis of arm     Left  . Elevated PSA 02/15/2011  . ICD (implantable cardiac defibrillator) battery depletion     Placed 2008  (eepisodes  nonsustained monomorphic VT in the past)  . Hx of CABG     1991  . Venous thrombosis     Left arm  . Carotid artery disease     Doppler, February, 2012, mild plaque, 0-39% bilateral, plan followup in one year  . Prostate cancer 2012    IMRT, ADT    Past Surgical History  Procedure Laterality Date  . Coronary artery bypass graft      LIMA_LAD, SVG-OM, Cx, RCA '91  . Rotator cuff repair  2005, 2007  . Tonsillectomy  2005    Malignant  lesion, w/XRT  . Cystectomy      Hand  . Icd placement    . Thumb surgery    . 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 Cardiac catheterization  12/05/06  . Menistectomies, left knee  '09    Patient Active Problem List   Diagnosis Date Noted  . Ventricular tachycardia 03/05/2014  . Routine health maintenance 12/11/2013  . Advanced care planning/counseling discussion 12/10/2013  . Cardiomyopathy, ischemic   . CAD (coronary artery disease)   . HTN (hypertension)   . Hyperlipidemia   . Encounter for servicing of automatic implantable cardioverter-defibrillator (AICD) at end of battery life   . Hx of CABG   . Carotid artery disease   . Prostate cancer 11/09/2011  . 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 ATTENTION DEFICIT HYPERACTIVITY DISORDER, ADULT 08/23/2008  . ESOPHAGEAL STRICTURE 08/23/2008  . DYSPEPSIA, HX OF 08/23/2008    ROS   patient denies fever, chills, headache, sweats, rash, change in vision, change in hearing, chest pain, cough, nausea or vomiting, urinary symptoms. All other systems are reviewed and are negative.  PHYSICAL EXAM  patient stable today. He is oriented to person time and place. Affect is normal. There is no jugular venous distention. Lungs are clear. Respiratory effort is nonlabored. Cardiac exam reveals an S1 and S2. Abdomen is soft. There is no peripheral edema.  Filed Vitals:   10/06/14 0805  BP: 152/102  Pulse: 64  Height: 6' (1.829 m)  Weight: 199 lb (90.266 kg)  SpO2: 97%     ASSESSMENT & PLAN

## 2014-10-07 ENCOUNTER — Ambulatory Visit: Payer: Medicare Other | Admitting: Cardiology

## 2014-10-20 ENCOUNTER — Encounter: Payer: Self-pay | Admitting: Cardiology

## 2014-10-20 ENCOUNTER — Telehealth: Payer: Self-pay

## 2014-10-20 NOTE — Telephone Encounter (Addendum)
At the pts last OV with Dr Ron Parker on 11/9 he was asked to start taking Amlodipine 5 mg daily, resume Valsartan 5 mg daily and to monitor his BP BID for 2 weeks then call the office to report his readings. His BP's were: 10/07/14 @ 12 pm BP=148/93  @ 6 pm BP=144/85 10/08/14 @ 12 pm BP= 147/89 @ 6 pm BP=140/84 10/09/14 @ 2 pm BP= 157/74  @ 5:30 pm BP=138/85 10/10/14 @ 11:30 BP= 146/87 @ 8 pm BP= 151/75 10/11/14 @ 11:30 BP= 135/80 @ 7:30 pm BP= 151/91 10/12/14 @ 12 pm BP=152/87 @ 7 pm BP= 139/86 10/13/14 @ 12:30 pm BP= 148/87 @ 8 pm BP= 139/83 10/14/14 @ 1:30 pm  BP=145/85 @ 7 pm BP= 134/83 10/15/14 @ 11:30 am BP= 125/83 @ 8:30 pm BP= 131/76 10/16/14 @ 1 pm BP= 133/82 @ 8 pm BP= 127/72 10/17/14 @ 1 pm BP=148/85  10/18/14 @  12:30 pm BP= 144/87 @ 6 pm BP=151/91 10/19/14 @ 1:30 pm BP= 134/80 @ 8 pm 147/87 Please advise.

## 2014-10-22 ENCOUNTER — Telehealth: Payer: Self-pay | Admitting: *Deleted

## 2014-10-22 MED ORDER — VALSARTAN 160 MG PO TABS: 160.0000 mg | ORAL_TABLET | Freq: Two times a day (BID) | ORAL | Status: DC

## 2014-10-22 NOTE — Telephone Encounter (Signed)
Contacted the pt to inform him that per Dr Ron Parker, he received  his recent logged BP readings and wanted to let the pt know that there is no need for him to come to his scheduled OV on Monday 10/27/14.  Informed the pt that per Dr Ron Parker he does want the pt to take his Valsartan from 160 mg once daily, to Valsartan 160 mg po BID.  Informed the pt that Dr Ron Parker would also like for him to continue with his current regimen of amlodipine 5 mg po daily.  Informed the pt that Dr Ron Parker would like for him to start taking and logging his BP readings in one week, at least 1-2 times a week for a couple weeks and report these readings to his nurse Jeani Hawking or a triage nurse.  Informed the pt that per Dr Ron Parker he would like for the pt to come in for a follow-up ov with him in one month.  Informed the pt that I will send Dr Ron Parker nurse and the schedulers a message to contact the pt and set up the one month f/u ov. Pt verbalized understanding with verbal read back, and agrees with this plan.

## 2014-10-27 ENCOUNTER — Ambulatory Visit: Payer: Medicare Other | Admitting: Cardiology

## 2014-11-10 ENCOUNTER — Encounter: Payer: Self-pay | Admitting: Cardiology

## 2014-11-12 ENCOUNTER — Telehealth: Payer: Self-pay | Admitting: *Deleted

## 2014-11-12 NOTE — Telephone Encounter (Signed)
Spoke with pt and told him Dr. Ron Parker had reviewed recent blood pressure readings and these are OK. Pt will not need previously scheduled office visit for this Friday-12/18 but Dr. Ron Parker would like to see him in 8 weeks. Appt made for January 05, 2015 at 9:30. Dr. Ron Parker would like pt to check blood pressure randomly throughout week and let us know readings in about 4 weeks.  Pt made aware of these instructions.

## 2014-11-12 NOTE — Telephone Encounter (Signed)
Pt called

## 2014-11-14 ENCOUNTER — Ambulatory Visit: Payer: Medicare Other | Admitting: Cardiology

## 2014-11-25 ENCOUNTER — Other Ambulatory Visit: Payer: Self-pay | Admitting: Cardiology

## 2014-11-27 ENCOUNTER — Other Ambulatory Visit: Payer: Self-pay | Admitting: Cardiology

## 2014-12-15 ENCOUNTER — Ambulatory Visit (INDEPENDENT_AMBULATORY_CARE_PROVIDER_SITE_OTHER): Payer: Medicare Other | Admitting: *Deleted

## 2014-12-15 DIAGNOSIS — I255 Ischemic cardiomyopathy: Secondary | ICD-10-CM

## 2014-12-15 DIAGNOSIS — I472 Ventricular tachycardia, unspecified: Secondary | ICD-10-CM

## 2014-12-15 LAB — MDC_IDC_ENUM_SESS_TYPE_REMOTE
Battery Voltage: 2.64 V
Brady Statistic RV Percent Paced: 0 %
Date Time Interrogation Session: 20160118154200
HighPow Impedance: 44 Ohm
Lead Channel Sensing Intrinsic Amplitude: 16.3 mV
Lead Channel Setting Pacing Amplitude: 2.5 V
Lead Channel Setting Pacing Pulse Width: 0.4 ms
Lead Channel Setting Sensing Sensitivity: 0.3 mV
MDC IDC MSMT LEADCHNL RV IMPEDANCE VALUE: 528 Ohm
MDC IDC SET ZONE DETECTION INTERVAL: 240 ms
Zone Setting Detection Interval: 310 ms
Zone Setting Detection Interval: 400 ms

## 2014-12-15 NOTE — Progress Notes (Signed)
Remote ICD transmission.   

## 2014-12-22 ENCOUNTER — Other Ambulatory Visit: Payer: Self-pay | Admitting: Geriatric Medicine

## 2014-12-22 MED ORDER — ESOMEPRAZOLE MAGNESIUM 40 MG PO CPDR: DELAYED_RELEASE_CAPSULE | ORAL | Status: DC

## 2014-12-24 ENCOUNTER — Encounter: Payer: Self-pay | Admitting: Cardiology

## 2014-12-25 ENCOUNTER — Encounter: Payer: Self-pay | Admitting: Cardiology

## 2015-01-01 ENCOUNTER — Encounter: Payer: Self-pay | Admitting: Internal Medicine

## 2015-01-01 ENCOUNTER — Encounter: Payer: Self-pay | Admitting: Cardiology

## 2015-01-05 ENCOUNTER — Encounter: Payer: Self-pay | Admitting: Cardiology

## 2015-01-05 ENCOUNTER — Ambulatory Visit (INDEPENDENT_AMBULATORY_CARE_PROVIDER_SITE_OTHER): Payer: Medicare Other | Admitting: Cardiology

## 2015-01-05 VITALS — BP 138/84 | HR 61 | Ht 72.0 in | Wt 208.0 lb

## 2015-01-05 DIAGNOSIS — Z4502 Encounter for adjustment and management of automatic implantable cardiac defibrillator: Secondary | ICD-10-CM

## 2015-01-05 DIAGNOSIS — I10 Essential (primary) hypertension: Secondary | ICD-10-CM

## 2015-01-05 DIAGNOSIS — I25810 Atherosclerosis of coronary artery bypass graft(s) without angina pectoris: Secondary | ICD-10-CM

## 2015-01-05 DIAGNOSIS — I255 Ischemic cardiomyopathy: Secondary | ICD-10-CM

## 2015-01-05 MED ORDER — AMLODIPINE BESYLATE 5 MG PO TABS: 5.0000 mg | ORAL_TABLET | Freq: Every day | ORAL | Status: DC

## 2015-01-05 NOTE — Assessment & Plan Note (Signed)
The patient's last cath was 2008. All of his grafts are patent. He does not need any type of exercise testing at this point.

## 2015-01-05 NOTE — Progress Notes (Signed)
Cardiology Office Note   Date:  01/05/2015   ID:  [name], DOB Jun 28, 1946, MRN [id number]  PCP:  Peter Hare, MD  Cardiologist:  Peter Argyle, MD   Chief Complaint  Patient presents with  . Appointment    Follow-up coronary disease and hypertension      History of Present Illness: [name] is a 69 y.o. male who presents today for follow-up of his coronary disease and cardiomyopathy and hypertension. I have followed him for many many years. He's doing well. Today we have finalized arrangements for him to be taken on as a new patient by Dr. Vertell [name] of the [facility] primary care team. He has an appointment for next week. He had been followed over the years previously by Dr. Linda [name], who is now retired.  The patient is post CABG in the past. He has a cardiomyopathy and he has an ICD in place. He has chronic systolic CHF that is very well-controlled. He is on appropriate medications. Recently we have adjusted his meds further for blood pressure control. He is on medications also for ADHD. These up and very carefully managed by his psychology team. He is a very intelligent reliable patient.    Past Medical History  Diagnosis Date  . Adult ADHD   . Malignant neoplasm of tonsil   . 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 Esophageal stricture   . Dyspepsia   . CAD (coronary artery disease)     Cath 2008, all grafts patent,   . HTN (hypertension)   . Hyperlipidemia   . GERD (gastroesophageal reflux disease)   . Thrombosis of arm     Left  . Elevated PSA 02/15/2011  . ICD (implantable cardiac defibrillator) battery depletion     Placed 2008  (eepisodes  nonsustained monomorphic VT in the past)  . Hx of CABG     1991  . Venous thrombosis     Left arm  . Carotid artery disease     Doppler, February, 2012, mild plaque, 0-39% bilateral, plan followup in one year  . Prostate cancer  2012    IMRT, ADT    Past Surgical History  Procedure Laterality Date  . Coronary artery bypass graft      LIMA_LAD, SVG-OM, Cx, RCA '91  . Rotator cuff repair  2005, 2007  . Tonsillectomy  2005    Malignant lesion, w/XRT  . Cystectomy      Hand  . Icd placement    . Thumb surgery    . 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 Cardiac catheterization  12/05/06  . Menistectomies, left knee  '09    Patient Active Problem List   Diagnosis Date Noted  . Ventricular tachycardia 03/05/2014  . Routine health maintenance 12/11/2013  . Advanced care planning/counseling discussion 12/10/2013  . Cardiomyopathy, ischemic   . CAD (coronary artery disease)   . Essential hypertension   . Hyperlipidemia   . Encounter for servicing of automatic implantable cardioverter-defibrillator (AICD) at end of battery life   . Hx of CABG   . Carotid artery disease   . Prostate cancer 11/09/2011  . 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 ATTENTION DEFICIT HYPERACTIVITY DISORDER, ADULT 08/23/2008  . ESOPHAGEAL STRICTURE 08/23/2008  . DYSPEPSIA, HX OF 08/23/2008      Current Outpatient Prescriptions  Medication Sig  Dispense Refill  . amLODipine (NORVASC) 5 MG tablet Take 1 tablet (5 mg total) by mouth daily. 90 tablet 3  . aspirin 81 MG EC tablet Take 81 mg by mouth daily.      Marland Kitchen atorvastatin (LIPITOR) 80 MG tablet TAKE 1 TABLET BY MOUTH EVERY DAY 90 tablet 0  . busPIRone (BUSPAR) 30 MG tablet Take 30 mg by mouth daily.    Marland Kitchen COREG CR 80 MG 24 hr capsule TAKE ONE CAPSULE BY MOUTH DAILY 90 capsule 2  . EPINEPHrine (EPIPEN) 0.3 mg/0.3 mL DEVI Inject 0.3 mLs (0.3 mg total) into the muscle as needed. 2 Device 0  . escitalopram (LEXAPRO) 20 MG tablet Take 20 mg by mouth daily.      Marland Kitchen esomeprazole (NEXIUM) 40 MG capsule TAKE 1 CAPSULE BY MOUTH EVERY MORNING BEFORE BREAKFAST 90 capsule 3  . Multiple Vitamins-Minerals (MULTIVITAMIN,TX-MINERALS) tablet Take  1 tablet by mouth daily.      . Multiple Vitamins-Minerals (OCUVITE PRESERVISION) TABS Take 1 tablet by mouth daily.     . NON FORMULARY Joint Juice: 8 oz daily     . valsartan (DIOVAN) 160 MG tablet Take 1 tablet (160 mg total) by mouth 2 (two) times daily. 90 tablet 3  . zolpidem (AMBIEN) 10 MG tablet Take 1 tablet by mouth at bedtime as needed for sleep.      No current facility-administered medications for this visit.    Allergies:   Other    Social History:  The patient  reports that he has never smoked. He does not have any smokeless tobacco history on file. He reports that he drinks about 14.4 oz of alcohol per week. He reports that he does not use illicit drugs.   Family History:  The patient's family history includes Alzheimer's disease in his mother; Breast cancer in his sister; Dementia in his mother; Heart disease in his brother and father; Hyperlipidemia in his father; Hypertension in his father. There is no history of Colon cancer, Prostate cancer, or Diabetes.    ROS:  Please see the history of present illness.  Patient denies fever, chills, headache, sweats, rash, change in vision, change in hearing, chest pain, cough, nausea or vomiting, urinary symptoms. All other systems are reviewed and are negative.      PHYSICAL EXAM: VS:  BP 138/84 mmHg  Pulse 61  Ht 6' (1.829 m)  Wt 208 lb (94.348 kg)  BMI 28.20 kg/m2  SpO2 97% , He is quite stable today. He has a full beard. He is oriented to person time and place. Affect is normal. Head is atraumatic. Sclera and conjunctiva are normal. There is no jugulovenous distention. Lungs are clear. Respiratory effort is not labored. Cardiac exam reveals S1 and S2. Abdomen is soft. There is no peripheral edema. There are no musculoskeletal deformities. There are no skin rashes. Neurologic is grossly intact.  EKG:   EKG is not done today.   Recent Labs: No results found for requested labs within last 365 days.    Lipid Panel      Component Value Date/Time   CHOL 185 01/08/2013 1108   TRIG 129.0 01/08/2013 1108   HDL 73.20 01/08/2013 1108   CHOLHDL 3 01/08/2013 1108   VLDL 25.8 01/08/2013 1108   LDLCALC 86 01/08/2013 1108      Wt Readings from Last 3 Encounters:  01/05/15 208 lb (94.348 kg)  10/06/14 199 lb (90.266 kg)  09/24/14 198 lb (89.812 kg)      Current medicines  are reviewed. He understands his medicines well. There may be some changes in his insurance that will affect his medications. We will try to help with this.   I will see him back in 6 months for follow-up.     ASSESSMENT AND PLAN:

## 2015-01-05 NOTE — Patient Instructions (Addendum)
**Note De-identified  Obfuscation** Your physician recommends that you continue on your current medications as directed. Please refer to the Current Medication list given to you today.  Your physician wants you to follow-up in: 6 months. You will receive a reminder letter in the mail two months in advance. If you don't receive a letter, please call our office to schedule the follow-up appointment.  

## 2015-01-05 NOTE — Assessment & Plan Note (Signed)
Blood pressures controlled on his current medications. He may have slight edema related to the addition of amlodipine. This does not bother him. It does not represent CHF. No change in therapy.

## 2015-01-05 NOTE — Assessment & Plan Note (Signed)
He is on appropriate medications and the doses that he can tolerate. No change in therapy.

## 2015-01-05 NOTE — Assessment & Plan Note (Signed)
The patient takes medications for this. I am not involved with caring for this problem.

## 2015-01-05 NOTE — Assessment & Plan Note (Signed)
His ICD is in place. This is being followed very carefully by electrophysiology.

## 2015-01-06 ENCOUNTER — Telehealth: Payer: Self-pay

## 2015-01-06 MED ORDER — CARVEDILOL 25 MG PO TABS: 25.0000 mg | ORAL_TABLET | Freq: Two times a day (BID) | ORAL | Status: DC

## 2015-01-06 NOTE — Telephone Encounter (Signed)
Change to carvedilol 25 twice a day

## 2015-01-06 NOTE — Telephone Encounter (Signed)
The pt is advised and he agrees with plan. RX sent to [facility] in [place] to fill per the pts request.

## 2015-01-06 NOTE — Telephone Encounter (Signed)
[employer], the pts insurance company, no longer covers Coreg 80 mg tablets as they state there is no generic in that mg. He is taking Coreg 80 mg daily. Is there another medication the pt can take instead?

## 2015-01-13 ENCOUNTER — Encounter: Payer: Self-pay | Admitting: Internal Medicine

## 2015-01-13 ENCOUNTER — Other Ambulatory Visit: Payer: Medicare Other

## 2015-01-13 ENCOUNTER — Other Ambulatory Visit (INDEPENDENT_AMBULATORY_CARE_PROVIDER_SITE_OTHER): Payer: Medicare Other

## 2015-01-13 ENCOUNTER — Ambulatory Visit (INDEPENDENT_AMBULATORY_CARE_PROVIDER_SITE_OTHER): Payer: Medicare Other | Admitting: Internal Medicine

## 2015-01-13 VITALS — BP 140/90 | HR 54 | Temp 98.1°F | Resp 16 | Ht 72.0 in | Wt 204.1 lb

## 2015-01-13 DIAGNOSIS — K222 Esophageal obstruction: Secondary | ICD-10-CM

## 2015-01-13 DIAGNOSIS — I10 Essential (primary) hypertension: Secondary | ICD-10-CM

## 2015-01-13 DIAGNOSIS — E789 Disorder of lipoprotein metabolism, unspecified: Secondary | ICD-10-CM

## 2015-01-13 DIAGNOSIS — Z0000 Encounter for general adult medical examination without abnormal findings: Secondary | ICD-10-CM

## 2015-01-13 DIAGNOSIS — E785 Hyperlipidemia, unspecified: Secondary | ICD-10-CM

## 2015-01-13 DIAGNOSIS — C61 Malignant neoplasm of prostate: Secondary | ICD-10-CM

## 2015-01-13 DIAGNOSIS — I25810 Atherosclerosis of coronary artery bypass graft(s) without angina pectoris: Secondary | ICD-10-CM

## 2015-01-13 LAB — COMPREHENSIVE METABOLIC PANEL
ALT: 20 U/L (ref 0–53)
AST: 16 U/L (ref 0–37)
Albumin: 4.3 g/dL (ref 3.5–5.2)
Alkaline Phosphatase: 72 U/L (ref 39–117)
BUN: 24 mg/dL — ABNORMAL HIGH (ref 6–23)
CO2: 29 mEq/L (ref 19–32)
CREATININE: 1.17 mg/dL (ref 0.40–1.50)
Calcium: 9.4 mg/dL (ref 8.4–10.5)
Chloride: 105 mEq/L (ref 96–112)
GFR: 66.05 mL/min (ref >60.00)
Glucose, Bld: 91 mg/dL (ref 70–99)
Potassium: 4.2 mEq/L (ref 3.5–5.1)
Sodium: 140 mEq/L (ref 135–145)
Total Bilirubin: 0.8 mg/dL (ref 0.2–1.2)
Total Protein: 7.2 g/dL (ref 6.0–8.3)

## 2015-01-13 LAB — LIPID PANEL
Cholesterol: 191 mg/dL (ref 0–200)
HDL: 64.6 mg/dL (ref >39.00)
LDL Cholesterol: 89 mg/dL (ref 0–99)
NonHDL: 126.4
Total CHOL/HDL Ratio: 3
Triglycerides: 189 mg/dL — ABNORMAL HIGH (ref 0.0–149.0)
VLDL: 37.8 mg/dL (ref 0.0–40.0)

## 2015-01-13 LAB — HEMOGLOBIN A1C: Hgb A1c MFr Bld: 5.9 % (ref 4.6–6.5)

## 2015-01-13 MED ORDER — PANTOPRAZOLE SODIUM 40 MG PO TBEC: 40.0000 mg | DELAYED_RELEASE_TABLET | Freq: Every day | ORAL | Status: DC

## 2015-01-13 NOTE — Progress Notes (Signed)
Pre visit review using our clinic review tool, if applicable. No additional management support is needed unless otherwise documented below in the visit note. 

## 2015-01-13 NOTE — Patient Instructions (Signed)
We have sent in a medicine called protonix (also called pantoprazole) which is still a once a day medicine that you can take for the acid reflux that will be covered under your insurance.   We will check on your blood work today and call you back about the results.   We will see you back in about 1 year if you are doing well. If you have problems or questions please feel free to call the office sooner.   For the oral surgery I would not see any problems with it. I would recommend to stop the aspirin about 5 days before the surgery. You can start taking it again 1 day after the surgery.  Health Maintenance A healthy lifestyle and preventative care can promote health and wellness.  Maintain regular health, dental, and eye exams.  Eat a healthy diet. Foods like vegetables, fruits, whole grains, low-fat dairy products, and lean protein foods contain the nutrients you need and are low in calories. Decrease your intake of foods high in solid fats, added sugars, and salt. Get information about a proper diet from your health care provider, if necessary.  Regular physical exercise is one of the most important things you can do for your health. Most adults should get at least 150 minutes of moderate-intensity exercise (any activity that increases your heart rate and causes you to sweat) each week. In addition, most adults need muscle-strengthening exercises on 2 or more days a week.   Maintain a healthy weight. The body mass index (BMI) is a screening tool to identify possible weight problems. It provides an estimate of body fat based on height and weight. Your health care provider can find your BMI and can help you achieve or maintain a healthy weight. For males 20 years and older:  A BMI below 18.5 is considered underweight.  A BMI of 18.5 to 24.9 is normal.  A BMI of 25 to 29.9 is considered overweight.  A BMI of 30 and above is considered obese.  Maintain normal blood lipids and cholesterol by  exercising and minimizing your intake of saturated fat. Eat a balanced diet with plenty of fruits and vegetables. Blood tests for lipids and cholesterol should begin at age 88 and be repeated every 5 years. If your lipid or cholesterol levels are high, you are over age 39, or you are at high risk for heart disease, you may need your cholesterol levels checked more frequently.Ongoing high lipid and cholesterol levels should be treated with medicines if diet and exercise are not working.  If you smoke, find out from your health care provider how to quit. If you do not use tobacco, do not start.  Lung cancer screening is recommended for adults aged 2-80 years who are at high risk for developing lung cancer because of a history of smoking. A yearly low-dose CT scan of the lungs is recommended for people who have at least a 30-pack-year history of smoking and are current smokers or have quit within the past 15 years. A pack year of smoking is smoking an average of 1 pack of cigarettes a day for 1 year (for example, a 30-pack-year history of smoking could mean smoking 1 pack a day for 30 years or 2 packs a day for 15 years). Yearly screening should continue until the smoker has stopped smoking for at least 15 years. Yearly screening should be stopped for people who develop a health problem that would prevent them from having lung cancer treatment.  If  you choose to drink alcohol, do not have more than 2 drinks per day. One drink is considered to be 12 oz (360 mL) of beer, 5 oz (150 mL) of wine, or 1.5 oz (45 mL) of liquor.  Avoid the use of street drugs. Do not share needles with anyone. Ask for help if you need support or instructions about stopping the use of drugs.  High blood pressure causes heart disease and increases the risk of stroke. Blood pressure should be checked at least every 1-2 years. Ongoing high blood pressure should be treated with medicines if weight loss and exercise are not  effective.  If you are 42-28 years old, ask your health care provider if you should take aspirin to prevent heart disease.  Diabetes screening involves taking a blood sample to check your fasting blood sugar level. This should be done once every 3 years after age 32 if you are at a normal weight and without risk factors for diabetes. Testing should be considered at a younger age or be carried out more frequently if you are overweight and have at least 1 risk factor for diabetes.  Colorectal cancer can be detected and often prevented. Most routine colorectal cancer screening begins at the age of 68 and continues through age 57. However, your health care provider may recommend screening at an earlier age if you have risk factors for colon cancer. On a yearly basis, your health care provider may provide home test kits to check for hidden blood in the stool. A small camera at the end of a tube may be used to directly examine the colon (sigmoidoscopy or colonoscopy) to detect the earliest forms of colorectal cancer. Talk to your health care provider about this at age 68 when routine screening begins. A direct exam of the colon should be repeated every 5-10 years through age 68, unless early forms of precancerous polyps or small growths are found.  People who are at an increased risk for hepatitis B should be screened for this virus. You are considered at high risk for hepatitis B if:  You were born in a country where hepatitis B occurs often. Talk with your health care provider about which countries are considered high risk.  Your parents were born in a high-risk country and you have not received a shot to protect against hepatitis B (hepatitis B vaccine).  You have HIV or AIDS.  You use needles to inject street drugs.  You live with, or have sex with, someone who has hepatitis B.  You are a man who has sex with other men (MSM).  You get hemodialysis treatment.  You take certain medicines for  conditions like cancer, organ transplantation, and autoimmune conditions.  Hepatitis C blood testing is recommended for all people born from [birth year] through 1964 and any individual with known risk factors for hepatitis C.  Healthy men should no longer receive prostate-specific antigen (PSA) blood tests as part of routine cancer screening. Talk to your health care provider about prostate cancer screening.  Testicular cancer screening is not recommended for adolescents or adult males who have no symptoms. Screening includes self-exam, a health care provider exam, and other screening tests. Consult with your health care provider about any symptoms you have or any concerns you have about testicular cancer.  Practice safe sex. Use condoms and avoid high-risk sexual practices to reduce the spread of sexually transmitted infections (STIs).  You should be screened for STIs, including gonorrhea and chlamydia if:  You are  sexually active and are younger than 24 years.  You are older than 24 years, and your health care provider tells you that you are at risk for this type of infection.  Your sexual activity has changed since you were last screened, and you are at an increased risk for chlamydia or gonorrhea. Ask your health care provider if you are at risk.  If you are at risk of being infected with HIV, it is recommended that you take a prescription medicine daily to prevent HIV infection. This is called pre-exposure prophylaxis (PrEP). You are considered at risk if:  You are a man who has sex with other men (MSM).  You are a heterosexual man who is sexually active with multiple partners.  You take drugs by injection.  You are sexually active with a partner who has HIV.  Talk with your health care provider about whether you are at high risk of being infected with HIV. If you choose to begin PrEP, you should first be tested for HIV. You should then be tested every 3 months for as long as you are taking  PrEP.  Use sunscreen. Apply sunscreen liberally and repeatedly throughout the day. You should seek shade when your shadow is shorter than you. Protect yourself by wearing long sleeves, pants, a wide-brimmed hat, and sunglasses year round whenever you are outdoors.  Tell your health care provider of new moles or changes in moles, especially if there is a change in shape or color. Also, tell your health care provider if a mole is larger than the size of a pencil eraser.  A one-time screening for abdominal aortic aneurysm (AAA) and surgical repair of large AAAs by ultrasound is recommended for men aged 4-75 years who are current or former smokers.  Stay current with your vaccines (immunizations). Document Released: 05/12/2008 Document Revised: 11/19/2013 Document Reviewed: 04/11/2011 [facility] Patient Information 2015 [place], [place]. This information is not intended to replace advice given to you by your health care provider. Make sure you discuss any questions you have with your health care provider.

## 2015-01-16 ENCOUNTER — Encounter: Payer: Self-pay | Admitting: Internal Medicine

## 2015-01-16 NOTE — Assessment & Plan Note (Signed)
Does not want colon cancer screening although he is due. Up to date on tetanus but declines pneumonia and shingles and flu shots. PSA up to date from urology (with hx prostate cancer).

## 2015-01-16 NOTE — Assessment & Plan Note (Signed)
Check lipid panel, on simvastatin without side effects.

## 2015-01-16 NOTE — Progress Notes (Signed)
   Subjective:    Patient ID: [name], male    DOB: 17-Feb-1946, 69 y.o.   MRN: [id number]  HPI The patient is a 69 YO man here for medicare wellness. He denies any new complaints since last visit. He does need a replacement for nexium as his insurance will not cover it.  Diet: heart healthy Physical activity: sedentary Depression/mood screen: negative Hearing: intact to whispered voice Visual acuity: grossly normal, performs annual eye exam  ADLs: capable Fall risk: none Home safety: good Cognitive evaluation: intact to orientation, naming, recall and repetition EOL planning: adv directives discussed  I have personally reviewed and have noted 1. The patient's medical and social history 2. Their use of alcohol, tobacco or illicit drugs 3. Their current medications and supplements 4. The patient's functional ability including ADL's, fall risks, home safety risks and hearing or visual impairment. 5. Diet and physical activities 6. Evidence for depression or mood disorders 7. Care team reviewed and updated  Review of Systems  Constitutional: Negative for chills, activity change, appetite change, fatigue and unexpected weight change.  HENT: Negative.   Eyes: Negative.   Respiratory: Negative for cough, chest tightness, shortness of breath and wheezing.   Cardiovascular: Negative for chest pain, palpitations and leg swelling.  Gastrointestinal: Negative for abdominal pain, diarrhea, constipation, blood in stool and abdominal distention.  Endocrine: Negative.   Musculoskeletal: Negative.   Skin: Negative.   Neurological: Negative.   Psychiatric/Behavioral: Negative.       Objective:   Physical Exam  Constitutional: He is oriented to person, place, and time. He appears well-developed and well-nourished.  HENT:  Head: Normocephalic and atraumatic.  Eyes: EOM are normal.  Neck: Normal range of motion.  Cardiovascular: Normal rate.   Pulmonary/Chest: Effort normal and  breath sounds normal.  Abdominal: Soft. Bowel sounds are normal. He exhibits no distension. There is no tenderness.  Musculoskeletal: He exhibits no edema.  Neurological: He is alert and oriented to person, place, and time. Coordination normal.  Skin: Skin is warm and dry.  Psychiatric: He has a normal mood and affect.   Filed Vitals:   01/13/15 1601  BP: 140/90  Pulse: 54  Temp: 98.1 F (36.7 C)  TempSrc: Oral  Resp: 16  Height: 6' (1.829 m)  Weight: 204 lb 1.9 oz (92.588 kg)  SpO2: 97%      Assessment & Plan:  Patient declines any immunizations today.

## 2015-01-16 NOTE — Assessment & Plan Note (Signed)
Follows with urology every 6 months and no problems with recurrence thus far.

## 2015-01-16 NOTE — Assessment & Plan Note (Signed)
BP controlled on amlodipine, coreg. Will continue to monitor and adjust as needed.

## 2015-01-16 NOTE — Assessment & Plan Note (Signed)
No anginal symptoms for now. Takes coreg, aspirin, simvastatin. Check lipid panel and continue with aggressive risk management.

## 2015-01-16 NOTE — Assessment & Plan Note (Signed)
PPI needed for ppx, nexium not covered so will switch to protonix.

## 2015-01-28 ENCOUNTER — Encounter: Payer: Self-pay | Admitting: Cardiology

## 2015-02-03 ENCOUNTER — Ambulatory Visit (INDEPENDENT_AMBULATORY_CARE_PROVIDER_SITE_OTHER): Payer: Medicare Other | Admitting: Ophthalmology

## 2015-02-03 ENCOUNTER — Encounter: Payer: Self-pay | Admitting: Cardiology

## 2015-02-03 DIAGNOSIS — D3132 Benign neoplasm of left choroid: Secondary | ICD-10-CM

## 2015-02-03 DIAGNOSIS — H33302 Unspecified retinal break, left eye: Secondary | ICD-10-CM | POA: Diagnosis not present

## 2015-02-03 DIAGNOSIS — H43813 Vitreous degeneration, bilateral: Secondary | ICD-10-CM

## 2015-02-03 DIAGNOSIS — H35033 Hypertensive retinopathy, bilateral: Secondary | ICD-10-CM | POA: Diagnosis not present

## 2015-02-03 DIAGNOSIS — H2513 Age-related nuclear cataract, bilateral: Secondary | ICD-10-CM | POA: Diagnosis not present

## 2015-02-03 DIAGNOSIS — H3531 Nonexudative age-related macular degeneration: Secondary | ICD-10-CM | POA: Diagnosis not present

## 2015-02-03 DIAGNOSIS — I10 Essential (primary) hypertension: Secondary | ICD-10-CM

## 2015-02-11 ENCOUNTER — Encounter: Payer: Self-pay | Admitting: Internal Medicine

## 2015-02-16 MED ORDER — RABEPRAZOLE SODIUM 20 MG PO TBEC: 20.0000 mg | DELAYED_RELEASE_TABLET | Freq: Every day | ORAL | Status: DC

## 2015-02-24 ENCOUNTER — Other Ambulatory Visit: Payer: Self-pay | Admitting: Cardiology

## 2015-02-24 NOTE — Telephone Encounter (Signed)
Peter Bjornstad, MD at 01/05/2015 11:01 AM  atorvastatin (LIPITOR) 80 MG tablet TAKE 1 TABLET BY MOUTH EVERY DAY Patient Instructions     Your physician recommends that you continue on your current medications as directed. Please refer to the Current Medication list given to you today.

## 2015-02-27 ENCOUNTER — Encounter: Payer: Self-pay | Admitting: Internal Medicine

## 2015-03-10 ENCOUNTER — Ambulatory Visit (INDEPENDENT_AMBULATORY_CARE_PROVIDER_SITE_OTHER): Payer: Medicare Other | Admitting: Internal Medicine

## 2015-03-10 ENCOUNTER — Encounter: Payer: Self-pay | Admitting: Internal Medicine

## 2015-03-10 VITALS — BP 150/90 | HR 63 | Ht 72.0 in | Wt 209.4 lb

## 2015-03-10 DIAGNOSIS — I472 Ventricular tachycardia, unspecified: Secondary | ICD-10-CM

## 2015-03-10 DIAGNOSIS — I10 Essential (primary) hypertension: Secondary | ICD-10-CM | POA: Diagnosis not present

## 2015-03-10 DIAGNOSIS — Z95810 Presence of automatic (implantable) cardiac defibrillator: Secondary | ICD-10-CM

## 2015-03-10 DIAGNOSIS — I255 Ischemic cardiomyopathy: Secondary | ICD-10-CM

## 2015-03-10 LAB — MDC_IDC_ENUM_SESS_TYPE_INCLINIC
Brady Statistic RV Percent Paced: 1 %
HIGH POWER IMPEDANCE MEASURED VALUE: 41 Ohm
HIGH POWER IMPEDANCE MEASURED VALUE: 41 Ohm
HIGH POWER IMPEDANCE MEASURED VALUE: 42 Ohm
HIGH POWER IMPEDANCE MEASURED VALUE: 43 Ohm
HIGH POWER IMPEDANCE MEASURED VALUE: 43 Ohm
HIGH POWER IMPEDANCE MEASURED VALUE: 44 Ohm
HIGH POWER IMPEDANCE MEASURED VALUE: 53 Ohm
HIGH POWER IMPEDANCE MEASURED VALUE: 53 Ohm
HIGH POWER IMPEDANCE MEASURED VALUE: 54 Ohm
HIGH POWER IMPEDANCE MEASURED VALUE: 54 Ohm
HIGH POWER IMPEDANCE MEASURED VALUE: 55 Ohm
HIGH POWER IMPEDANCE MEASURED VALUE: 55 Ohm
HighPow Impedance: 42 Ohm
HighPow Impedance: 42 Ohm
HighPow Impedance: 42 Ohm
HighPow Impedance: 42 Ohm
HighPow Impedance: 43 Ohm
HighPow Impedance: 43 Ohm
HighPow Impedance: 43 Ohm
HighPow Impedance: 43 Ohm
HighPow Impedance: 43 Ohm
HighPow Impedance: 43 Ohm
HighPow Impedance: 53 Ohm
HighPow Impedance: 53 Ohm
HighPow Impedance: 54 Ohm
HighPow Impedance: 54 Ohm
HighPow Impedance: 55 Ohm
HighPow Impedance: 55 Ohm
HighPow Impedance: 55 Ohm
HighPow Impedance: 55 Ohm
HighPow Impedance: 56 Ohm
Lead Channel Impedance Value: 496 Ohm
Lead Channel Impedance Value: 496 Ohm
Lead Channel Impedance Value: 496 Ohm
Lead Channel Impedance Value: 512 Ohm
Lead Channel Impedance Value: 528 Ohm
Lead Channel Impedance Value: 536 Ohm
Lead Channel Impedance Value: 536 Ohm
Lead Channel Impedance Value: 600 Ohm
Lead Channel Sensing Intrinsic Amplitude: 15.5 mV
Lead Channel Sensing Intrinsic Amplitude: 15.5 mV
Lead Channel Sensing Intrinsic Amplitude: 15.7 mV
Lead Channel Sensing Intrinsic Amplitude: 15.7 mV
Lead Channel Sensing Intrinsic Amplitude: 15.8 mV
Lead Channel Sensing Intrinsic Amplitude: 16 mV
Lead Channel Sensing Intrinsic Amplitude: 16 mV
Lead Channel Sensing Intrinsic Amplitude: 16.6 mV
MDC IDC MSMT BATTERY VOLTAGE: 2.64 V
MDC IDC MSMT LEADCHNL RV IMPEDANCE VALUE: 488 Ohm
MDC IDC MSMT LEADCHNL RV IMPEDANCE VALUE: 488 Ohm
MDC IDC MSMT LEADCHNL RV IMPEDANCE VALUE: 496 Ohm
MDC IDC MSMT LEADCHNL RV IMPEDANCE VALUE: 496 Ohm
MDC IDC MSMT LEADCHNL RV IMPEDANCE VALUE: 520 Ohm
MDC IDC MSMT LEADCHNL RV IMPEDANCE VALUE: 536 Ohm
MDC IDC MSMT LEADCHNL RV IMPEDANCE VALUE: 536 Ohm
MDC IDC MSMT LEADCHNL RV PACING THRESHOLD AMPLITUDE: 1 V
MDC IDC MSMT LEADCHNL RV PACING THRESHOLD PULSEWIDTH: 0.2 ms
MDC IDC MSMT LEADCHNL RV SENSING INTR AMPL: 15.6 mV
MDC IDC MSMT LEADCHNL RV SENSING INTR AMPL: 15.9 mV
MDC IDC MSMT LEADCHNL RV SENSING INTR AMPL: 16.2 mV
MDC IDC MSMT LEADCHNL RV SENSING INTR AMPL: 16.2 mV
MDC IDC MSMT LEADCHNL RV SENSING INTR AMPL: 16.2 mV
MDC IDC MSMT LEADCHNL RV SENSING INTR AMPL: 16.2 mV
MDC IDC MSMT LEADCHNL RV SENSING INTR AMPL: 16.2 mV
MDC IDC SESS DTM: 20160412101257
MDC IDC SET LEADCHNL RV PACING AMPLITUDE: 2.5 V
MDC IDC SET LEADCHNL RV PACING PULSEWIDTH: 0.4 ms
MDC IDC SET LEADCHNL RV SENSING SENSITIVITY: 0.3 mV
MDC IDC SET ZONE DETECTION INTERVAL: 240 ms
MDC IDC SET ZONE DETECTION INTERVAL: 400 ms
Zone Setting Detection Interval: 310 ms

## 2015-03-10 NOTE — Patient Instructions (Addendum)
Your physician wants you to follow-up in: 12 months with Dr Knox Saliva will receive a reminder letter in the mail two months in advance. If you don't receive a letter, please call our office to schedule the follow-up appointment.  Remote monitoring is used to monitor your Pacemaker or ICD from home. This monitoring reduces the number of office visits required to check your device to one time per year. It allows [place] to keep an eye on the functioning of your device to ensure it is working properly. You are scheduled for a device check from home on 04/08/15. You may send your transmission at any time that day. If you have a wireless device, the transmission will be sent automatically. After your physician reviews your transmission, you will receive a postcard with your next transmission date.

## 2015-03-10 NOTE — Assessment & Plan Note (Signed)
He has had a single isolated episode of rapid VT for which ATP did not terminate the tachycardia followed by a successful shock. This occurred over 6 months ago. No change in medications for now.

## 2015-03-10 NOTE — Progress Notes (Signed)
HPI [name] returns today for followup. He is a very pleasant middle-age man with an ischemic cardiomyopathy, chronic systolic heart failure, and ventricular tachycardia. He is status post ICD implantation. He denies any recent ICD shocks. No peripheral edema, chest pain, no shortness of breath, no syncope. In the interim, he has done well. He reports a single episode where his heart was racing but did not feel any ICD shock. Allergies  Allergen Reactions  . Other Anaphylaxis    Insect bites (Has EpiPen)     Current Outpatient Prescriptions  Medication Sig Dispense Refill  . amLODipine (NORVASC) 5 MG tablet Take 1 tablet (5 mg total) by mouth daily. 90 tablet 3  . aspirin 81 MG EC tablet Take 81 mg by mouth daily.      Marland Kitchen atorvastatin (LIPITOR) 80 MG tablet TAKE 1 TABLET BY MOUTH EVERY DAY 90 tablet 3  . busPIRone (BUSPAR) 30 MG tablet Take 30 mg by mouth daily.    . carvedilol (COREG) 25 MG tablet Take 1 tablet (25 mg total) by mouth 2 (two) times daily. 180 tablet 2  . EPINEPHrine (EPIPEN) 0.3 mg/0.3 mL DEVI Inject 0.3 mLs (0.3 mg total) into the muscle as needed. 2 Device 0  . escitalopram (LEXAPRO) 20 MG tablet Take 20 mg by mouth daily.      . Multiple Vitamins-Minerals (MULTIVITAMIN,TX-MINERALS) tablet Take 1 tablet by mouth daily.      . Multiple Vitamins-Minerals (OCUVITE PRESERVISION) TABS Take 1 tablet by mouth daily.     . NON FORMULARY Joint Juice: 8 oz daily     . pantoprazole (PROTONIX) 40 MG tablet Take 1 tablet (40 mg total) by mouth daily. 90 tablet 3  . RABEprazole (ACIPHEX) 20 MG tablet Take 1 tablet (20 mg total) by mouth daily. 30 tablet 6  . valsartan (DIOVAN) 160 MG tablet Take 1 tablet (160 mg total) by mouth 2 (two) times daily. 90 tablet 3  . zolpidem (AMBIEN) 10 MG tablet Take 1 tablet by mouth at bedtime as needed for sleep.      No current facility-administered medications for this visit.     Past Medical History  Diagnosis Date  . Adult ADHD   .  Malignant neoplasm of tonsil   . 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 Esophageal stricture   . Dyspepsia   . CAD (coronary artery disease)     Cath 2008, all grafts patent,   . HTN (hypertension)   . Hyperlipidemia   . GERD (gastroesophageal reflux disease)   . Thrombosis of arm     Left  . Elevated PSA 02/15/2011  . ICD (implantable cardiac defibrillator) battery depletion     Placed 2008  (eepisodes  nonsustained monomorphic VT in the past)  . Hx of CABG     1991  . Venous thrombosis     Left arm  . Carotid artery disease     Doppler, February, 2012, mild plaque, 0-39% bilateral, plan followup in one year  . Prostate cancer 2012    IMRT, ADT    ROS:   All systems reviewed and negative except as noted in the HPI.   Past Surgical History  Procedure Laterality Date  . Coronary artery bypass graft      LIMA_LAD, SVG-OM, Cx, RCA '91  . Rotator cuff repair  2005, 2007  . Tonsillectomy  2005    Malignant lesion, w/XRT  .  Cystectomy      Hand  . Icd placement    . Thumb surgery    . 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 Cardiac catheterization  12/05/06  . Menistectomies, left knee  '09     Family History  Problem Relation Age of Onset  . Alzheimer's disease Mother   . Dementia Mother     living in [place]  . Hyperlipidemia Father   . Hypertension Father   . Heart disease Father     CAD, CHF, had pcemaker  . Heart disease Brother     Bypass surgery  . Colon cancer Neg Hx   . Prostate cancer Neg Hx   . Diabetes Neg Hx   . Breast cancer Sister     survivor     History   Social History  . Marital Status: Married    Spouse Name: N/A  . Number of Children: 0  . Years of Education: N/A   Occupational History  . 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 Smoking status: Never Smoker   . Smokeless  tobacco: Not on file  . Alcohol Use: 14.4 oz/week    24 Cans of beer per week  . Drug Use: No  . Sexual Activity: Yes    Birth Control/ Protection: None   Other Topics Concern  . Not on file   Social History Narrative   [employer]. 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 Marriage in good health, wife in good health. ACP discussed Jan '15: no CPR, no mechanical ventilation. He is referred to [email or web address] and provided with packet with HCPOA-his wife./ Advanced directive.     There were no vitals taken for this visit.  Physical Exam:  Well appearing middle-aged man, NAD HEENT: Unremarkable Neck:  7 cm JVD, no thyromegally Lungs:  Clear with no wheezes, rales, or rhonchi. HEART:  Regular rate rhythm, no murmurs, no rubs, no clicks Abd:  soft, positive bowel sounds, no organomegally, no rebound, no guarding Ext:  2 plus pulses, no edema, no cyanosis, no clubbing Skin:  No rashes no nodules Neuro:  CN II through XII intact, motor grossly intact  DEVICE  Normal device function.  See PaceArt for details. Approaching ERI.  Assess/Plan:

## 2015-03-10 NOTE — Assessment & Plan Note (Signed)
No anginal symptoms. He is encouraged to increase his physical activity.

## 2015-03-10 NOTE — Assessment & Plan Note (Signed)
His blood pressure is elevated today. He states that at home it is in the 130 range. He is encouraged to reduce his sodium intake. No change in meds for now.

## 2015-03-10 NOTE — Assessment & Plan Note (Signed)
HIs medtronic ICD is working normally. He is approaching ERI. Will recheck in several months.

## 2015-03-16 ENCOUNTER — Telehealth: Payer: Self-pay | Admitting: Internal Medicine

## 2015-03-16 NOTE — Telephone Encounter (Signed)
Spoke w/ pt and informed him that transmission was received and that he did reach ERI. Pt agreed to appt w/ MD on 03-20-15.

## 2015-03-16 NOTE — Telephone Encounter (Signed)
New message       1. Has your device fired? no 2. Is you device beeping? no  3. Are you experiencing draining or swelling at device site?  4. Are you calling to see if we received your device transmission? no 5. Have you passed out?no Low battery sound went off over the weekend.  Please call

## 2015-03-16 NOTE — Telephone Encounter (Signed)
Requested that pt send manual transmission pt verbalized understanding.

## 2015-03-17 ENCOUNTER — Encounter: Payer: Self-pay | Admitting: Internal Medicine

## 2015-03-19 ENCOUNTER — Telehealth: Payer: Self-pay | Admitting: *Deleted

## 2015-03-19 NOTE — Telephone Encounter (Signed)
Scheduled generator change for 5/19  Patient aware of instructions

## 2015-03-19 NOTE — Telephone Encounter (Signed)
lmom for patient to return my call to scheduled generator change for his ICD

## 2015-03-20 ENCOUNTER — Encounter: Payer: Medicare Other | Admitting: Internal Medicine

## 2015-04-06 ENCOUNTER — Encounter (HOSPITAL_COMMUNITY): Payer: Self-pay | Admitting: Internal Medicine

## 2015-04-06 ENCOUNTER — Ambulatory Visit (HOSPITAL_COMMUNITY)
Admission: RE | Admit: 2015-04-06 | Discharge: 2015-04-06 | Disposition: A | Discharge status: Home / Self Care | Payer: Medicare Other | Source: Ambulatory Visit | Attending: Internal Medicine | Admitting: Internal Medicine

## 2015-04-06 ENCOUNTER — Encounter (HOSPITAL_COMMUNITY)
Admission: RE | Disposition: A | Discharge status: Home / Self Care | Payer: Medicare Other | Source: Ambulatory Visit | Attending: Internal Medicine

## 2015-04-06 ENCOUNTER — Encounter (HOSPITAL_COMMUNITY): Admission: RE | Payer: Self-pay | Source: Ambulatory Visit

## 2015-04-06 ENCOUNTER — Ambulatory Visit (HOSPITAL_COMMUNITY): Admission: RE | Admit: 2015-04-06 | Payer: Medicare Other | Source: Ambulatory Visit | Admitting: Internal Medicine

## 2015-04-06 DIAGNOSIS — I2510 Atherosclerotic heart disease of native coronary artery without angina pectoris: Secondary | ICD-10-CM | POA: Diagnosis not present

## 2015-04-06 DIAGNOSIS — F909 Attention-deficit hyperactivity disorder, unspecified type: Secondary | ICD-10-CM | POA: Diagnosis not present

## 2015-04-06 DIAGNOSIS — Z95810 Presence of automatic (implantable) cardiac defibrillator: Secondary | ICD-10-CM | POA: Diagnosis present

## 2015-04-06 DIAGNOSIS — Z951 Presence of aortocoronary bypass graft: Secondary | ICD-10-CM | POA: Diagnosis not present

## 2015-04-06 DIAGNOSIS — Z8546 Personal history of malignant neoplasm of prostate: Secondary | ICD-10-CM | POA: Insufficient documentation

## 2015-04-06 DIAGNOSIS — Z85818 Personal history of malignant neoplasm of other sites of lip, oral cavity, and pharynx: Secondary | ICD-10-CM | POA: Insufficient documentation

## 2015-04-06 DIAGNOSIS — I255 Ischemic cardiomyopathy: Secondary | ICD-10-CM | POA: Diagnosis present

## 2015-04-06 DIAGNOSIS — K219 Gastro-esophageal reflux disease without esophagitis: Secondary | ICD-10-CM | POA: Insufficient documentation

## 2015-04-06 DIAGNOSIS — I10 Essential (primary) hypertension: Secondary | ICD-10-CM | POA: Insufficient documentation

## 2015-04-06 DIAGNOSIS — E785 Hyperlipidemia, unspecified: Secondary | ICD-10-CM | POA: Insufficient documentation

## 2015-04-06 DIAGNOSIS — I5022 Chronic systolic (congestive) heart failure: Secondary | ICD-10-CM | POA: Insufficient documentation

## 2015-04-06 HISTORY — PX: IMPLANTABLE CARDIOVERTER DEFIBRILLATOR (ICD) GENERATOR CHANGE: SHX5469

## 2015-04-06 LAB — CBC
HCT: 40.7 % (ref 39.0–52.0)
Hemoglobin: 13.2 g/dL (ref 13.0–17.0)
MCH: 28.9 pg (ref 26.0–34.0)
MCHC: 32.4 g/dL (ref 30.0–36.0)
MCV: 89.1 fL (ref 78.0–100.0)
PLATELETS: 165 10*3/uL (ref 150–400)
RBC: 4.57 MIL/uL (ref 4.22–5.81)
RDW: 15.1 % (ref 11.5–15.5)
WBC: 4.8 10*3/uL (ref 4.0–10.5)

## 2015-04-06 LAB — BASIC METABOLIC PANEL
ANION GAP: 10 (ref 5–15)
BUN: 16 mg/dL (ref 6–20)
CALCIUM: 8.7 mg/dL — AB (ref 8.9–10.3)
CO2: 24 mmol/L (ref 22–32)
Chloride: 104 mmol/L (ref 101–111)
Creatinine, Ser: 1.28 mg/dL — ABNORMAL HIGH (ref 0.61–1.24)
GFR calc Af Amer: >60 mL/min (ref >60)
GFR calc non Af Amer: 56 mL/min — ABNORMAL LOW (ref >60)
Glucose, Bld: 96 mg/dL (ref 70–99)
Potassium: 3.9 mmol/L (ref 3.5–5.1)
SODIUM: 138 mmol/L (ref 135–145)

## 2015-04-06 LAB — PROTIME-INR
INR: 0.98 (ref 0.00–1.49)
Prothrombin Time: 13 seconds (ref 11.6–15.2)

## 2015-04-06 LAB — SURGICAL PCR SCREEN
MRSA, PCR: NEGATIVE
Staphylococcus aureus: NEGATIVE

## 2015-04-06 SURGERY — ICD GENERATOR CHANGE
Anesthesia: LOCAL

## 2015-04-06 SURGERY — ICD GENERATOR CHANGE

## 2015-04-06 MED ORDER — FENTANYL CITRATE (PF) 100 MCG/2ML IJ SOLN
INTRAMUSCULAR | Status: DC | PRN
  Administered 2015-04-06 (×3): 12.5 ug via INTRAVENOUS
  Administered 2015-04-06: 25 ug via INTRAVENOUS

## 2015-04-06 MED ORDER — MIDAZOLAM HCL 5 MG/5ML IJ SOLN
INTRAMUSCULAR | Status: AC
  Filled 2015-04-06: qty 5

## 2015-04-06 MED ORDER — CEFAZOLIN SODIUM-DEXTROSE 2-3 GM-% IV SOLR
2.0000 g | INTRAVENOUS | Status: DC
  Filled 2015-04-06: qty 50

## 2015-04-06 MED ORDER — SODIUM CHLORIDE 0.9 % IV SOLN
INTRAVENOUS | Status: DC
  Administered 2015-04-06: 08:00:00 via INTRAVENOUS

## 2015-04-06 MED ORDER — ONDANSETRON HCL 4 MG/2ML IJ SOLN: 4.0000 mg | Freq: Four times a day (QID) | INTRAMUSCULAR | Status: DC | PRN

## 2015-04-06 MED ORDER — MUPIROCIN 2 % EX OINT
1.0000 "application " | TOPICAL_OINTMENT | Freq: Once | CUTANEOUS | Status: AC
  Administered 2015-04-06: 1 via TOPICAL
  Filled 2015-04-06: qty 22

## 2015-04-06 MED ORDER — CEFAZOLIN SODIUM 1 G IJ SOLR
2.0000 g | INTRAMUSCULAR | Status: DC | PRN
  Administered 2015-04-06: 2 g via INTRAVENOUS

## 2015-04-06 MED ORDER — CHLORHEXIDINE GLUCONATE 4 % EX LIQD
60.0000 mL | Freq: Once | CUTANEOUS | Status: DC
  Filled 2015-04-06: qty 60

## 2015-04-06 MED ORDER — LIDOCAINE HCL (PF) 1 % IJ SOLN
INTRAMUSCULAR | Status: AC
Start: 2015-04-06 — End: 2015-04-06
  Filled 2015-04-06: qty 60

## 2015-04-06 MED ORDER — MIDAZOLAM HCL 5 MG/5ML IJ SOLN
INTRAMUSCULAR | Status: DC | PRN
  Administered 2015-04-06: 1 mg via INTRAVENOUS
  Administered 2015-04-06: 2 mg via INTRAVENOUS
  Administered 2015-04-06 (×2): 1 mg via INTRAVENOUS

## 2015-04-06 MED ORDER — SODIUM CHLORIDE 0.9 % IR SOLN
80.0000 mg | Status: DC
  Filled 2015-04-06 (×2): qty 2

## 2015-04-06 MED ORDER — ACETAMINOPHEN 325 MG PO TABS
325.0000 mg | ORAL_TABLET | ORAL | Status: DC | PRN
  Filled 2015-04-06: qty 2

## 2015-04-06 MED ORDER — FENTANYL CITRATE (PF) 100 MCG/2ML IJ SOLN
INTRAMUSCULAR | Status: AC
  Filled 2015-04-06: qty 2

## 2015-04-06 MED ORDER — CEFAZOLIN SODIUM-DEXTROSE 2-3 GM-% IV SOLR
INTRAVENOUS | Status: AC
  Filled 2015-04-06: qty 50

## 2015-04-06 SURGICAL SUPPLY — 4 items
CABLE SURGICAL S-101-97-12 (CABLE) ×3 IMPLANT
ICD EVERA XT VR DVBB1D1 (ICD Generator) ×3 IMPLANT
PAD DEFIB LIFELINK (PAD) ×3 IMPLANT
TRAY PACEMAKER INSERTION (CUSTOM PROCEDURE TRAY) ×3 IMPLANT

## 2015-04-06 NOTE — H&P (View-Only) (Signed)
HPI [name] returns today for followup. He is a very pleasant middle-age man with an ischemic cardiomyopathy, chronic systolic heart failure, and ventricular tachycardia. He is status post ICD implantation. He denies any recent ICD shocks. No peripheral edema, chest pain, no shortness of breath, no syncope. In the interim, he has done well. He reports a single episode where his heart was racing but did not feel any ICD shock. Allergies  Allergen Reactions  . Other Anaphylaxis    Insect bites (Has EpiPen)     Current Outpatient Prescriptions  Medication Sig Dispense Refill  . amLODipine (NORVASC) 5 MG tablet Take 1 tablet (5 mg total) by mouth daily. 90 tablet 3  . aspirin 81 MG EC tablet Take 81 mg by mouth daily.      Marland Kitchen atorvastatin (LIPITOR) 80 MG tablet TAKE 1 TABLET BY MOUTH EVERY DAY 90 tablet 3  . busPIRone (BUSPAR) 30 MG tablet Take 30 mg by mouth daily.    . carvedilol (COREG) 25 MG tablet Take 1 tablet (25 mg total) by mouth 2 (two) times daily. 180 tablet 2  . EPINEPHrine (EPIPEN) 0.3 mg/0.3 mL DEVI Inject 0.3 mLs (0.3 mg total) into the muscle as needed. 2 Device 0  . escitalopram (LEXAPRO) 20 MG tablet Take 20 mg by mouth daily.      . Multiple Vitamins-Minerals (MULTIVITAMIN,TX-MINERALS) tablet Take 1 tablet by mouth daily.      . Multiple Vitamins-Minerals (OCUVITE PRESERVISION) TABS Take 1 tablet by mouth daily.     . NON FORMULARY Joint Juice: 8 oz daily     . pantoprazole (PROTONIX) 40 MG tablet Take 1 tablet (40 mg total) by mouth daily. 90 tablet 3  . RABEprazole (ACIPHEX) 20 MG tablet Take 1 tablet (20 mg total) by mouth daily. 30 tablet 6  . valsartan (DIOVAN) 160 MG tablet Take 1 tablet (160 mg total) by mouth 2 (two) times daily. 90 tablet 3  . zolpidem (AMBIEN) 10 MG tablet Take 1 tablet by mouth at bedtime as needed for sleep.      No current facility-administered medications for this visit.     Past Medical History  Diagnosis Date  . Adult ADHD   .  Malignant neoplasm of tonsil   . 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 Esophageal stricture   . Dyspepsia   . CAD (coronary artery disease)     Cath 2008, all grafts patent,   . HTN (hypertension)   . Hyperlipidemia   . GERD (gastroesophageal reflux disease)   . Thrombosis of arm     Left  . Elevated PSA 02/15/2011  . ICD (implantable cardiac defibrillator) battery depletion     Placed 2008  (eepisodes  nonsustained monomorphic VT in the past)  . Hx of CABG     1991  . Venous thrombosis     Left arm  . Carotid artery disease     Doppler, February, 2012, mild plaque, 0-39% bilateral, plan followup in one year  . Prostate cancer 2012    IMRT, ADT    ROS:   All systems reviewed and negative except as noted in the HPI.   Past Surgical History  Procedure Laterality Date  . Coronary artery bypass graft      LIMA_LAD, SVG-OM, Cx, RCA '91  . Rotator cuff repair  2005, 2007  . Tonsillectomy  2005    Malignant lesion, w/XRT  .  Cystectomy      Hand  . Icd placement    . Thumb surgery    . 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 Cardiac catheterization  12/05/06  . Menistectomies, left knee  '09     Family History  Problem Relation Age of Onset  . Alzheimer's disease Mother   . Dementia Mother     living in [place]  . Hyperlipidemia Father   . Hypertension Father   . Heart disease Father     CAD, CHF, had pcemaker  . Heart disease Brother     Bypass surgery  . Colon cancer Neg Hx   . Prostate cancer Neg Hx   . Diabetes Neg Hx   . Breast cancer Sister     survivor     History   Social History  . Marital Status: Married    Spouse Name: N/A  . Number of Children: 0  . Years of Education: N/A   Occupational History  . 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 Smoking status: Never Smoker   . Smokeless  tobacco: Not on file  . Alcohol Use: 14.4 oz/week    24 Cans of beer per week  . Drug Use: No  . Sexual Activity: Yes    Birth Control/ Protection: None   Other Topics Concern  . Not on file   Social History Narrative   [employer], [employer]. 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 Marriage in good health, wife in good health. ACP discussed Jan '15: no CPR, no mechanical ventilation. He is referred to [email or web address] and provided with packet with HCPOA-his wife./ Advanced directive.     There were no vitals taken for this visit.  Physical Exam:  Well appearing middle-aged man, NAD HEENT: Unremarkable Neck:  7 cm JVD, no thyromegally Lungs:  Clear with no wheezes, rales, or rhonchi. HEART:  Regular rate rhythm, no murmurs, no rubs, no clicks Abd:  soft, positive bowel sounds, no organomegally, no rebound, no guarding Ext:  2 plus pulses, no edema, no cyanosis, no clubbing Skin:  No rashes no nodules Neuro:  CN II through XII intact, motor grossly intact  DEVICE  Normal device function.  See PaceArt for details. Approaching ERI.  Assess/Plan:

## 2015-04-06 NOTE — CV Procedure (Signed)
EP procedure note  Procedure performed: Removal of a previously implanted ICD and insertion of a new ICD  Preprocedure diagnosis: Ischemic cardiomyopathy and ventricular tachycardia, sustained, monomorphic  Postprocedure diagnosis: Same as preprocedure diagnosis  Description of the procedure: After informed consent was obtained, the patient was taken to the diagnostic EP lab in the fasting state. After the usual preparation draping, intravenous Versed and fentanyl was used for sedation. 30 cc of lidocaine was infiltrated into the left infraclavicular region. A 6 cm incision was carried out. Electrocautery was utilized to dissect down to the ICD pocket. The ICD generator and lead was freed up of its dense fibrous adhesions. Electrocautery was utilized to assure hemostasis. The ICD generator was removed. The lead was disconnected from the ICD generator. The lead was evaluated and found to be working satisfactorily. R waves measured 18, the impedance was 467, and the threshold was less than a volt at 0.5 ms. The new Medtronic single-chamber ICD, serial number [id number] was connected to the old ICD lead and placed back in the subcutaneous pocket. The pocket was irrigated with antibiotic irrigation. The incision was closed with 2 layers of Vicryl suture. Benzoin and Steri-Strips her pain on the skin. The patient was returned to his room in satisfactory condition.   Complications: There were no immediate procedure complications  Conclusion: Successful removal of a previous implanted ICD which was at elective replacement, and insertion of a new ICD in a patient with an ischemic cardiomyopathy and sustained monomorphic ventricular tachycardia resulting in ICD shocks.  Cristopher Peru, M.D.

## 2015-04-06 NOTE — H&P (Signed)
  ICD Criteria  Current LVEF:25% ;Obtained > 6 months ago.   NYHA Functional Classification: Class II  Heart Failure History:  Yes, Duration of heart failure since onset is > 9 months  Non-Ischemic Dilated Cardiomyopathy History:  No.  Atrial Fibrillation/Atrial Flutter:  No.  Ventricular Tachycardia History:  Yes, Hemodynamic instability present, VT Type:  SVT - Monomorphic.  Cardiac Arrest History:  No  History of Syndromes with Risk of Sudden Death:  No.  Previous ICD:  Yes, ICD Type:  Single, Reason for ICD:  Secondary, reason for secondary prevention:  Ventricular Tachycardia  Electrophysiology Study: No.  Prior MI: Yes, Most recent MI timeframe is > 40 days.  PPM: No.  OSA:  No  Patient Life Expectancy of >=1 year: Yes.  Anticoagulation Therapy:  Patient is NOT on anticoagulation therapy.   Beta Blocker Therapy:  Yes.   Ace Inhibitor/ARB Therapy:  Yes.

## 2015-04-06 NOTE — Interval H&P Note (Signed)
History and Physical Interval Note:  04/06/2015 10:16 AM  [name]  has presented today for surgery, with the diagnosis of eol  The various methods of treatment have been discussed with the patient and family. After consideration of risks, benefits and other options for treatment, the patient has consented to  Procedure(s): ICD [employer] (N/A) as a surgical intervention .  The patient's history has been reviewed, patient examined, no change in status, stable for surgery.  I have reviewed the patient's chart and labs.  Questions were answered to the patient's satisfaction.     Mikle Bosworth.D.

## 2015-04-06 NOTE — Discharge Instructions (Signed)
Pacemaker Battery Change, Care After °Refer to this sheet in the next few weeks. These instructions provide you with information on caring for yourself after your procedure. Your health care provider may also give you more specific instructions. Your treatment has been planned according to current medical practices, but problems sometimes occur. Call your health care provider if you have any problems or questions after your procedure. °WHAT TO EXPECT AFTER THE PROCEDURE °After your procedure, it is typical to have the following sensations: °· Soreness at the pacemaker site. °HOME CARE INSTRUCTIONS  °· Keep the incision clean and dry. °· Unless advised otherwise, you may shower beginning 48 hours after your procedure. °· For the first week after the replacement, avoid stretching motions that pull at the incision site, and avoid heavy exercise with the arm that is on the same side as the incision. °· Take medicines only as directed by your health care provider. °· Keep all follow-up visits as directed by your health care provider. °SEEK MEDICAL CARE IF:  °· You have pain at the incision site that is not relieved by over-the-counter or prescription medicine. °· There is drainage or pus from the incision site. °· There is swelling larger than a lime at the incision site. °· You develop red streaking that extends above or below the incision site. °· You feel brief, intermittent palpitations, light-headedness, or any symptoms that you feel might be related to your heart. °SEEK IMMEDIATE MEDICAL CARE IF:  °· You experience chest pain that is different than the pain at the pacemaker site. °· You experience shortness of breath. °· You have palpitations or irregular heartbeat. °· You have light-headedness that does not go away quickly. °· You faint. °· You have pain that gets worse and is not relieved by medicine. °Document Released: 09/04/2013 Document Revised: 03/31/2014 Document Reviewed: 09/04/2013 °ExitCare® Patient  Information ©2015 [employer]. This information is not intended to replace advice given to you by your health care provider. Make sure you discuss any questions you have with your health care provider. ° °

## 2015-04-07 MED FILL — Lidocaine HCl Local Preservative Free (PF) Inj 1%: INTRAMUSCULAR | Qty: 30 | Status: AC

## 2015-04-08 ENCOUNTER — Encounter: Payer: Medicare Other | Admitting: *Deleted

## 2015-04-10 ENCOUNTER — Encounter: Payer: Self-pay | Admitting: Cardiology

## 2015-04-16 ENCOUNTER — Ambulatory Visit (INDEPENDENT_AMBULATORY_CARE_PROVIDER_SITE_OTHER): Payer: Medicare Other | Admitting: *Deleted

## 2015-04-16 DIAGNOSIS — Z95810 Presence of automatic (implantable) cardiac defibrillator: Secondary | ICD-10-CM | POA: Diagnosis not present

## 2015-04-16 DIAGNOSIS — I255 Ischemic cardiomyopathy: Secondary | ICD-10-CM

## 2015-04-16 LAB — CUP PACEART INCLINIC DEVICE CHECK
Battery Remaining Longevity: 137 mo
Brady Statistic RV Percent Paced: 2.7 %
HIGH POWER IMPEDANCE MEASURED VALUE: 57 Ohm
HighPow Impedance: 190 Ohm
HighPow Impedance: 43 Ohm
Lead Channel Pacing Threshold Pulse Width: 0.4 ms
Lead Channel Sensing Intrinsic Amplitude: 19.75 mV
Lead Channel Setting Sensing Sensitivity: 0.3 mV
MDC IDC MSMT BATTERY VOLTAGE: 3.15 V
MDC IDC MSMT LEADCHNL RV IMPEDANCE VALUE: 532 Ohm
MDC IDC MSMT LEADCHNL RV PACING THRESHOLD AMPLITUDE: 0.5 V
MDC IDC MSMT LEADCHNL RV SENSING INTR AMPL: 21.875 mV
MDC IDC SESS DTM: 20160519160842
MDC IDC SET LEADCHNL RV PACING AMPLITUDE: 2 V
MDC IDC SET LEADCHNL RV PACING PULSEWIDTH: 0.4 ms
MDC IDC SET ZONE DETECTION INTERVAL: 300 ms
Zone Setting Detection Interval: 400 ms
Zone Setting Detection Interval: 450 ms

## 2015-04-16 NOTE — Progress Notes (Signed)
Changeout wound check appointment. Steri-strips removed. Wound without redness or edema. Removed 1 suture. Normal device function. Threshold, sensing, and impedances consistent with implant measurements. Device programmed at chronic output setting. Histogram distribution appropriate for patient and level of activity. No ventricular arrhythmias noted. Patient educated about wound care, arm mobility, lifting restrictions, shock plan. ROV w/ GT 07/28/15.

## 2015-04-23 ENCOUNTER — Encounter: Payer: Self-pay | Admitting: Internal Medicine

## 2015-05-12 ENCOUNTER — Other Ambulatory Visit: Payer: Self-pay | Admitting: Cardiology

## 2015-05-13 ENCOUNTER — Other Ambulatory Visit: Payer: Self-pay | Admitting: Cardiology

## 2015-05-23 ENCOUNTER — Encounter: Payer: Self-pay | Admitting: Cardiology

## 2015-07-28 ENCOUNTER — Ambulatory Visit (INDEPENDENT_AMBULATORY_CARE_PROVIDER_SITE_OTHER): Payer: Medicare Other | Admitting: Internal Medicine

## 2015-07-28 ENCOUNTER — Encounter: Payer: Self-pay | Admitting: Internal Medicine

## 2015-07-28 VITALS — BP 128/84 | HR 62 | Ht 72.0 in | Wt 206.4 lb

## 2015-07-28 DIAGNOSIS — I255 Ischemic cardiomyopathy: Secondary | ICD-10-CM | POA: Diagnosis not present

## 2015-07-28 DIAGNOSIS — I472 Ventricular tachycardia, unspecified: Secondary | ICD-10-CM

## 2015-07-28 DIAGNOSIS — Z95810 Presence of automatic (implantable) cardiac defibrillator: Secondary | ICD-10-CM | POA: Diagnosis not present

## 2015-07-28 LAB — CUP PACEART INCLINIC DEVICE CHECK
Battery Remaining Longevity: 136 mo
Battery Voltage: 3.11 V
Brady Statistic RV Percent Paced: 2.14 %
Date Time Interrogation Session: 20160830131607
HIGH POWER IMPEDANCE MEASURED VALUE: 171 Ohm
HIGH POWER IMPEDANCE MEASURED VALUE: 55 Ohm
HighPow Impedance: 42 Ohm
Lead Channel Impedance Value: 551 Ohm
Lead Channel Sensing Intrinsic Amplitude: 26.875 mV
Lead Channel Setting Pacing Pulse Width: 0.4 ms
Lead Channel Setting Sensing Sensitivity: 0.3 mV
MDC IDC MSMT LEADCHNL RV PACING THRESHOLD AMPLITUDE: 0.5 V
MDC IDC MSMT LEADCHNL RV PACING THRESHOLD PULSEWIDTH: 0.4 ms
MDC IDC SET LEADCHNL RV PACING AMPLITUDE: 2 V
MDC IDC SET ZONE DETECTION INTERVAL: 400 ms
MDC IDC SET ZONE DETECTION INTERVAL: 450 ms
Zone Setting Detection Interval: 300 ms

## 2015-07-28 NOTE — Assessment & Plan Note (Signed)
He has had no recurrent VT. He will continue his current meds.  

## 2015-07-28 NOTE — Assessment & Plan Note (Signed)
He denies anginal symptoms. He is active. No change in medical therapy.

## 2015-07-28 NOTE — Assessment & Plan Note (Signed)
His medtronic device is working normally. Will recheck in several months. 11 years of longevity is expected.

## 2015-07-28 NOTE — Progress Notes (Signed)
HPI [name] returns today for followup. He is a very pleasant middle-age man with an ischemic cardiomyopathy, chronic systolic heart failure, and ventricular tachycardia. He is status post ICD implantation with recent generator change. He denies any recent ICD shocks. No peripheral edema, chest pain, no shortness of breath, no syncope. In the interim, he has done well.  Allergies  Allergen Reactions  . Other Anaphylaxis    Insect bites (Has EpiPen)     Current Outpatient Prescriptions  Medication Sig Dispense Refill  . amLODipine (NORVASC) 5 MG tablet Take 1 tablet (5 mg total) by mouth daily. 90 tablet 3  . aspirin 81 MG EC tablet Take 81 mg by mouth daily.      Marland Kitchen atorvastatin (LIPITOR) 80 MG tablet TAKE 1 TABLET BY MOUTH EVERY DAY 90 tablet 3  . buPROPion (WELLBUTRIN XL) 150 MG 24 hr tablet Take 150 mg by mouth daily.  0  . busPIRone (BUSPAR) 30 MG tablet Take 30 mg by mouth daily.    . carvedilol (COREG) 25 MG tablet Take 1 tablet (25 mg total) by mouth 2 (two) times daily. 180 tablet 2  . EPINEPHrine (EPIPEN) 0.3 mg/0.3 mL DEVI Inject 0.3 mLs (0.3 mg total) into the muscle as needed. 2 Device 0  . escitalopram (LEXAPRO) 20 MG tablet Take 20 mg by mouth daily.      Marland Kitchen esomeprazole (NEXIUM) 20 MG capsule Take 20 mg by mouth daily at 12 noon.    . Multiple Vitamins-Minerals (MULTIVITAMIN,TX-MINERALS) tablet Take 1 tablet by mouth daily.      . Multiple Vitamins-Minerals (OCUVITE PRESERVISION) TABS Take 1 tablet by mouth daily.     . NON FORMULARY Joint Juice: 8 oz daily     . pantoprazole (PROTONIX) 40 MG tablet Take 1 tablet (40 mg total) by mouth daily. 90 tablet 3  . RABEprazole (ACIPHEX) 20 MG tablet Take 1 tablet (20 mg total) by mouth daily. 30 tablet 6  . valsartan (DIOVAN) 160 MG tablet TAKE 1 TABLET BY MOUTH TWICE A DAY 180 tablet 0  . zolpidem (AMBIEN) 10 MG tablet Take 1 tablet by mouth at bedtime as needed for sleep.      No current facility-administered medications for  this visit.     Past Medical History  Diagnosis Date  . Adult ADHD   . Malignant neoplasm of tonsil   . 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 Esophageal stricture   . Dyspepsia   . CAD (coronary artery disease)     Cath 2008, all grafts patent,   . HTN (hypertension)   . Hyperlipidemia   . GERD (gastroesophageal reflux disease)   . Thrombosis of arm     Left  . Elevated PSA 02/15/2011  . ICD (implantable cardiac defibrillator) battery depletion     Placed 2008  (eepisodes  nonsustained monomorphic VT in the past)  . Hx of CABG     1991  . Venous thrombosis     Left arm  . Carotid artery disease     Doppler, February, 2012, mild plaque, 0-39% bilateral, plan followup in one year  . Prostate cancer 2012    IMRT, ADT    ROS:   All systems reviewed and negative except as noted in the HPI.   Past Surgical History  Procedure Laterality Date  . Coronary artery bypass graft      LIMA_LAD, SVG-OM, Cx, RCA '91  .  Rotator cuff repair  2005, 2007  . Tonsillectomy  2005    Malignant lesion, w/XRT  . Cystectomy      Hand  . Icd placement    . Thumb surgery    . 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 Cardiac catheterization  12/05/06  . Menistectomies, left knee  '09  . Implantable cardioverter defibrillator (icd) generator change  04/06/2015    Procedure: Icd Generator Change;  Surgeon: Evans Lance, MD;  Location: [facility];  Service: Cardiovascular;;     Family History  Problem Relation Age of Onset  . Alzheimer's disease Mother   . Dementia Mother     living in [place]  . Hyperlipidemia Father   . Hypertension Father   . Heart disease Father     CAD, CHF, had pcemaker  . Heart disease Brother     Bypass surgery  . Colon cancer Neg Hx   . Prostate cancer Neg Hx   . Diabetes Neg Hx   . Breast cancer Sister     survivor     Social  History   Social History  . Marital Status: Married    Spouse Name: N/A  . Number of Children: 0  . Years of Education: N/A   Occupational History  . 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 Smoking status: Never Smoker   . Smokeless tobacco: Not on file  . Alcohol Use: 14.4 oz/week    24 Cans of beer per week  . Drug Use: No  . Sexual Activity: Yes    Birth Control/ Protection: None   Other Topics Concern  . Not on file   Social History Narrative   [employer], [employer]. 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 Marriage in good health, wife in good health. ACP discussed Jan '15: no CPR, no mechanical ventilation. He is referred to [email or web address] and provided with packet with HCPOA-his wife./ Advanced directive.     BP 128/84 mmHg  Pulse 62  Ht 6' (1.829 m)  Wt 206 lb 6.4 oz (93.622 kg)  BMI 27.99 kg/m2  Physical Exam:  Well appearing middle-aged man, NAD HEENT: Unremarkable Neck:  7 cm JVD, no thyromegally Lungs:  Clear with no wheezes, rales, or rhonchi. HEART:  Regular rate rhythm, no murmurs, no rubs, no clicks Abd:  soft, positive bowel sounds, no organomegally, no rebound, no guarding Ext:  2 plus pulses, no edema, no cyanosis, no clubbing Skin:  No rashes no nodules Neuro:  CN II through XII intact, motor grossly intact  DEVICE  Normal device function.  See PaceArt for details.  Assess/Plan:

## 2015-07-28 NOTE — Patient Instructions (Signed)
Medication Instructions:  Your physician recommends that you continue on your current medications as directed. Please refer to the Current Medication list given to you today.   Labwork: None ordered  Testing/Procedures: None ordered  Follow-Up: Your physician wants you to follow-up in: 12 months with Dr Taylor You will receive a reminder letter in the mail two months in advance. If you don't receive a letter, please call our office to schedule the follow-up appointment.  Remote monitoring is used to monitor your Pacemaker  from home. This monitoring reduces the number of office visits required to check your device to one time per year. It allows us to keep an eye on the functioning of your device to ensure it is working properly. You are scheduled for a device check from home on 10/27/15. You may send your transmission at any time that day. If you have a wireless device, the transmission will be sent automatically. After your physician reviews your transmission, you will receive a postcard with your next transmission date.    Any Other Special Instructions Will Be Listed Below (If Applicable).   

## 2015-07-29 ENCOUNTER — Encounter: Payer: Self-pay | Admitting: Cardiology

## 2015-08-05 ENCOUNTER — Encounter: Payer: Self-pay | Admitting: *Deleted

## 2015-08-07 ENCOUNTER — Encounter: Payer: Self-pay | Admitting: Cardiology

## 2015-08-07 DIAGNOSIS — R9430 Abnormal result of cardiovascular function study, unspecified: Secondary | ICD-10-CM | POA: Insufficient documentation

## 2015-08-07 DIAGNOSIS — F909 Attention-deficit hyperactivity disorder, unspecified type: Secondary | ICD-10-CM | POA: Insufficient documentation

## 2015-08-07 DIAGNOSIS — Z951 Presence of aortocoronary bypass graft: Secondary | ICD-10-CM | POA: Insufficient documentation

## 2015-08-10 ENCOUNTER — Encounter: Payer: Self-pay | Admitting: *Deleted

## 2015-08-10 ENCOUNTER — Encounter: Payer: Self-pay | Admitting: Cardiology

## 2015-08-10 ENCOUNTER — Ambulatory Visit (INDEPENDENT_AMBULATORY_CARE_PROVIDER_SITE_OTHER): Payer: Medicare Other | Admitting: Cardiology

## 2015-08-10 VITALS — BP 124/60 | HR 56 | Ht 72.0 in | Wt 206.0 lb

## 2015-08-10 DIAGNOSIS — F909 Attention-deficit hyperactivity disorder, unspecified type: Secondary | ICD-10-CM

## 2015-08-10 DIAGNOSIS — I25810 Atherosclerosis of coronary artery bypass graft(s) without angina pectoris: Secondary | ICD-10-CM

## 2015-08-10 DIAGNOSIS — I255 Ischemic cardiomyopathy: Secondary | ICD-10-CM | POA: Diagnosis not present

## 2015-08-10 DIAGNOSIS — I779 Disorder of arteries and arterioles, unspecified: Secondary | ICD-10-CM | POA: Diagnosis not present

## 2015-08-10 DIAGNOSIS — I472 Ventricular tachycardia, unspecified: Secondary | ICD-10-CM

## 2015-08-10 DIAGNOSIS — I739 Peripheral vascular disease, unspecified: Secondary | ICD-10-CM

## 2015-08-10 DIAGNOSIS — I10 Essential (primary) hypertension: Secondary | ICD-10-CM

## 2015-08-10 DIAGNOSIS — F900 Attention-deficit hyperactivity disorder, predominantly inattentive type: Secondary | ICD-10-CM | POA: Diagnosis not present

## 2015-08-10 DIAGNOSIS — Z95810 Presence of automatic (implantable) cardiac defibrillator: Secondary | ICD-10-CM

## 2015-08-10 NOTE — Assessment & Plan Note (Signed)
The patient's ICD generator was just replaced. He is followed by Dr. Lovena Le. He's stable.

## 2015-08-10 NOTE — Assessment & Plan Note (Signed)
The patient is on medications for this. His meds are overseen carefully by his psychology team. He has done very well with this.

## 2015-08-10 NOTE — Assessment & Plan Note (Signed)
Blood pressure is stable. No change in therapy. 

## 2015-08-10 NOTE — Assessment & Plan Note (Signed)
His bypass surgery was done in 1991. Over time he had had some monomorphic ventricular tachycardia. He did not have syncope with this in the past. His ICD was placed in 2008. Very recently he had a generator change. He has not been having any significant ventricular tachycardia.

## 2015-08-10 NOTE — Assessment & Plan Note (Signed)
Over time he has been on the medication dosing that has worked for him. I've been careful to try to be sure that his medications and did not interact negatively with his ADHD meds. At this time I will not be changing his meds any further. Over time this could be considered if it is felt to be clinically appropriate.

## 2015-08-10 NOTE — Assessment & Plan Note (Signed)
The patient has minimal disease. His last Doppler was 2014 with very minimal disease. The plan at that time was for 2 year follow-up. This can be considered at the time of his next visit.

## 2015-08-10 NOTE — Progress Notes (Signed)
Cardiology Office Note   Date:  08/10/2015   ID:  [name], DOB [birth year], MRN [id number]  PCP:  Olga Millers, MD  Cardiologist:  Dola Argyle, MD   Chief Complaint  Patient presents with  . Appointment    Follow-up coronary disease and ischemic cardiomyopathy      History of Present Illness: [name] is a 69 y.o. male who presents today to follow-up coronary disease and ischemic cardiomyopathy and hypertension. He recently had an ICD generator change. He has seen Dr. Lovena Le in follow-up. He has significant left ventricular dysfunction. He has not had an echo for many many years. He has been clinically stable. He is on the medications for CHF that he has been able to tolerate in the past. Because of complicated medicines for his adult ADHD, I have chosen not to push his medicines any further over time. Of course this can always be reconsidered going forward.  He's done very well over the years and he continues to do well. He is not having any chest pain or shortness of breath. When his ICD has been interrogated, he has not had any recurring ventricular tachycardia.    Past Medical History  Diagnosis Date  . Adult ADHD   . Malignant neoplasm of tonsil   . 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 Esophageal stricture   . Dyspepsia   . CAD (coronary artery disease)     Cath 2008, all grafts patent,   . HTN (hypertension)   . Hyperlipidemia   . GERD (gastroesophageal reflux disease)   . Thrombosis of arm     Left  . Elevated PSA 02/15/2011  . ICD (implantable cardiac defibrillator) battery depletion     Placed 2008  (eepisodes  nonsustained monomorphic VT in the past)  . Hx of CABG     1991  . Venous thrombosis     Left arm  . Carotid artery disease     Doppler, February, 2012, mild plaque, 0-39% bilateral, plan followup in one year  . Prostate cancer 2012    IMRT, ADT  .  Ejection fraction < 50%     Past Surgical History  Procedure Laterality Date  . Coronary artery bypass graft      LIMA_LAD, SVG-OM, Cx, RCA '91  . Rotator cuff repair  2005, 2007  . Tonsillectomy  2005    Malignant lesion, w/XRT  . Cystectomy      Hand  . Icd placement    . Thumb surgery    . 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 Cardiac catheterization  12/05/06  . Menistectomies, left knee  '09  . Implantable cardioverter defibrillator (icd) generator change  04/06/2015    Procedure: Icd Generator Change;  Surgeon: Evans Lance, MD;  Location: [facility];  Service: Cardiovascular;;    Patient Active Problem List   Diagnosis Date Noted  . Hx of CABG   . Adult ADHD   . Ejection fraction < 50%   . ICD (implantable cardioverter-defibrillator) in place 03/10/2015  . Ventricular tachycardia 03/05/2014  . Routine health maintenance 12/11/2013  . Advanced care planning/counseling discussion 12/10/2013  . Cardiomyopathy, ischemic   . CAD (coronary artery disease)   . Essential hypertension   . Hyperlipidemia   . Encounter for servicing of automatic implantable cardioverter-defibrillator (AICD) at  end of battery life   . Carotid artery disease   . Prostate cancer 11/09/2011  . 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 ESOPHAGEAL STRICTURE 08/23/2008      Current Outpatient Prescriptions  Medication Sig Dispense Refill  . amLODipine (NORVASC) 5 MG tablet Take 1 tablet (5 mg total) by mouth daily. 90 tablet 3  . aspirin 81 MG EC tablet Take 81 mg by mouth daily.      Marland Kitchen atorvastatin (LIPITOR) 80 MG tablet TAKE 1 TABLET BY MOUTH EVERY DAY 90 tablet 3  . buPROPion (WELLBUTRIN XL) 150 MG 24 hr tablet Take 150 mg by mouth daily.  0  . busPIRone (BUSPAR) 30 MG tablet Take 30 mg by mouth daily.    . carvedilol (COREG) 25 MG tablet Take 1 tablet (25 mg total) by mouth 2 (two) times daily. 180 tablet 2  . EPINEPHrine (EPIPEN)  0.3 mg/0.3 mL DEVI Inject 0.3 mLs (0.3 mg total) into the muscle as needed. 2 Device 0  . escitalopram (LEXAPRO) 20 MG tablet Take 20 mg by mouth daily.      Marland Kitchen esomeprazole (NEXIUM) 20 MG capsule Take 20 mg by mouth daily at 12 noon.    . Multiple Vitamins-Minerals (MULTIVITAMIN,TX-MINERALS) tablet Take 1 tablet by mouth daily.      . Multiple Vitamins-Minerals (OCUVITE PRESERVISION) TABS Take 1 tablet by mouth daily.     . NON FORMULARY Joint Juice: 8 oz daily     . pantoprazole (PROTONIX) 40 MG tablet Take 1 tablet (40 mg total) by mouth daily. 90 tablet 3  . RABEprazole (ACIPHEX) 20 MG tablet Take 1 tablet (20 mg total) by mouth daily. 30 tablet 6  . valsartan (DIOVAN) 160 MG tablet TAKE 1 TABLET BY MOUTH TWICE A DAY 180 tablet 0  . zolpidem (AMBIEN) 10 MG tablet Take 1 tablet by mouth at bedtime as needed for sleep.      No current facility-administered medications for this visit.    Allergies:   Other    Social History:  The patient  reports that he has never smoked. He does not have any smokeless tobacco history on file. He reports that he drinks about 14.4 oz of alcohol per week. He reports that he does not use illicit drugs.   Family History:  The patient's family history includes Alzheimer's disease in his mother; Breast cancer in his sister; CAD in his father; Congestive Heart Failure in his father; Dementia in his mother; Heart disease in his brother; Hyperlipidemia in his father; Hypertension in his father. There is no history of Colon cancer, Prostate cancer, or Diabetes.    ROS:  Please see the history of present illness.  Patient denies fever, chills, headache, sweats, rash, change in vision, change in hearing, chest pain, cough, nausea or vomiting, urinary symptoms. All other systems are reviewed and are negative.   PHYSICAL EXAM: VS:  There were no vitals taken for this visit. , patient is oriented to person time and place. Affect is normal. Head is atraumatic. Sclera  conjunctiva are normal. There is no jugular venous distention. Lungs are clear. Respiratory effort is unlabored. Cardiac exam reveals an S1 and S2. The abdomen is soft. There is no peripheral edema. There are no musculoskeletal deformities. There are no skin rashes.   EKG:   EKG is not done today. He had recent follow-up after his ICD generator replacement  Recent Labs: 01/13/2015: ALT 20 04/06/2015: BUN 16; Creatinine, Ser 1.28*; Hemoglobin 13.2; Platelets 165; Potassium 3.9;  Sodium 138    Lipid Panel    Component Value Date/Time   CHOL 191 01/13/2015 1654   TRIG 189.0* 01/13/2015 1654   HDL 64.60 01/13/2015 1654   CHOLHDL 3 01/13/2015 1654   VLDL 37.8 01/13/2015 1654   LDLCALC 89 01/13/2015 1654      Wt Readings from Last 3 Encounters:  07/28/15 206 lb 6.4 oz (93.622 kg)  04/06/15 200 lb (90.719 kg)  03/10/15 209 lb 6.4 oz (94.983 kg)      Current medicines are reviewed  The patient understands his medications.     ASSESSMENT AND PLAN:

## 2015-08-10 NOTE — Patient Instructions (Signed)
Medication Instructions:  No changes today  Labwork: None today  Testing/Procedures: Your physician has requested that you have an echocardiogram. Echocardiography is a painless test that uses sound waves to create images of your heart. It provides your doctor with information about the size and shape of your heart and how well your heart's chambers and valves are working. This procedure takes approximately one hour. There are no restrictions for this procedure.    Follow-Up: Your physician wants you to follow-up in: 7 months with Dr Acie Fredrickson. (April 2017). You will receive a reminder letter in the mail two months in advance. If you don't receive a letter, please call our office to schedule the follow-up appointment.

## 2015-08-10 NOTE — Assessment & Plan Note (Signed)
We know that his grafts were patent in 2008. He has been asymptomatic. I have not done exercise testing or an echo since his cath in 2008. Today we will schedule echo. I will be in touch with him with the results. I've chosen not to do exercise testing.

## 2015-08-18 ENCOUNTER — Other Ambulatory Visit: Payer: Self-pay

## 2015-08-18 ENCOUNTER — Ambulatory Visit (HOSPITAL_COMMUNITY): Payer: Medicare Other | Attending: Cardiovascular Disease

## 2015-08-18 DIAGNOSIS — I255 Ischemic cardiomyopathy: Secondary | ICD-10-CM | POA: Insufficient documentation

## 2015-08-18 DIAGNOSIS — E785 Hyperlipidemia, unspecified: Secondary | ICD-10-CM | POA: Insufficient documentation

## 2015-08-18 DIAGNOSIS — I10 Essential (primary) hypertension: Secondary | ICD-10-CM | POA: Insufficient documentation

## 2015-08-18 DIAGNOSIS — R29898 Other symptoms and signs involving the musculoskeletal system: Secondary | ICD-10-CM | POA: Insufficient documentation

## 2015-08-18 DIAGNOSIS — I517 Cardiomegaly: Secondary | ICD-10-CM | POA: Diagnosis not present

## 2015-08-18 DIAGNOSIS — Z8249 Family history of ischemic heart disease and other diseases of the circulatory system: Secondary | ICD-10-CM | POA: Diagnosis not present

## 2015-08-18 DIAGNOSIS — I351 Nonrheumatic aortic (valve) insufficiency: Secondary | ICD-10-CM | POA: Diagnosis not present

## 2015-08-18 NOTE — Progress Notes (Unsigned)
2D Echocardiogram Complete.  08/18/2015   Deliah Boston, RDCS   Preliminary Technician Findings:  The EF is moderately to severely decreased (around 20-30%).  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 [place]- No thrombus seen).  History of MI and CABG in 2008, without echo since these events.   [name] does not complain of any symptoms at rest or with exertion, and appears stable during exam (BP 124/60).

## 2015-08-19 ENCOUNTER — Telehealth: Payer: Self-pay | Admitting: Cardiology

## 2015-08-19 NOTE — Telephone Encounter (Signed)
The pt has been given his Echo results. He verbalized understanding.

## 2015-08-19 NOTE — Telephone Encounter (Signed)
New message   Pt calling stating he is returning [place] call, please call pt

## 2015-09-10 ENCOUNTER — Encounter: Payer: Self-pay | Admitting: *Deleted

## 2015-10-03 ENCOUNTER — Other Ambulatory Visit: Payer: Self-pay | Admitting: Cardiology

## 2015-10-06 ENCOUNTER — Other Ambulatory Visit: Payer: Self-pay | Admitting: Cardiology

## 2015-10-27 ENCOUNTER — Telehealth: Payer: Self-pay | Admitting: Cardiology

## 2015-10-27 ENCOUNTER — Ambulatory Visit (INDEPENDENT_AMBULATORY_CARE_PROVIDER_SITE_OTHER): Payer: Medicare Other | Admitting: *Deleted

## 2015-10-27 DIAGNOSIS — I255 Ischemic cardiomyopathy: Secondary | ICD-10-CM | POA: Diagnosis not present

## 2015-10-27 NOTE — Telephone Encounter (Signed)
LMOVM reminding pt to send remote transmission.   

## 2015-10-29 NOTE — Progress Notes (Signed)
Remote ICD transmission.   

## 2015-11-09 LAB — CUP PACEART REMOTE DEVICE CHECK
Battery Remaining Longevity: 135 mo
Battery Voltage: 3.06 V
Brady Statistic RV Percent Paced: 1.46 %
Date Time Interrogation Session: 20161129193852
HighPow Impedance: 42 Ohm
HighPow Impedance: 53 Ohm
Implantable Lead Location: 753860
Implantable Lead Model: 6947
Lead Channel Impedance Value: 456 Ohm
Lead Channel Impedance Value: 513 Ohm
Lead Channel Pacing Threshold Amplitude: 0.375 V
Lead Channel Pacing Threshold Pulse Width: 0.4 ms
Lead Channel Setting Pacing Amplitude: 2 V
Lead Channel Setting Sensing Sensitivity: 0.3 mV
MDC IDC LEAD IMPLANT DT: 20080108
MDC IDC MSMT LEADCHNL RV SENSING INTR AMPL: 20.375 mV
MDC IDC MSMT LEADCHNL RV SENSING INTR AMPL: 20.375 mV
MDC IDC SET LEADCHNL RV PACING PULSEWIDTH: 0.4 ms

## 2015-11-10 ENCOUNTER — Encounter: Payer: Self-pay | Admitting: Cardiology

## 2015-12-21 ENCOUNTER — Emergency Department (HOSPITAL_COMMUNITY): Payer: Medicare Other

## 2015-12-21 ENCOUNTER — Encounter (HOSPITAL_COMMUNITY): Payer: Self-pay | Admitting: Emergency Medicine

## 2015-12-21 ENCOUNTER — Inpatient Hospital Stay (HOSPITAL_COMMUNITY)
Admission: EM | Admit: 2015-12-21 | Discharge: 2015-12-23 | DRG: 282 | Disposition: A | Discharge status: Home / Self Care | Payer: Medicare Other | Attending: Cardiovascular Disease | Admitting: Cardiovascular Disease

## 2015-12-21 ENCOUNTER — Encounter (HOSPITAL_COMMUNITY)
Admission: EM | Disposition: A | Discharge status: Home / Self Care | Payer: Medicare Other | Source: Home / Self Care | Attending: Cardiovascular Disease

## 2015-12-21 DIAGNOSIS — R0789 Other chest pain: Secondary | ICD-10-CM | POA: Diagnosis present

## 2015-12-21 DIAGNOSIS — I2510 Atherosclerotic heart disease of native coronary artery without angina pectoris: Secondary | ICD-10-CM | POA: Diagnosis present

## 2015-12-21 DIAGNOSIS — I2109 ST elevation (STEMI) myocardial infarction involving other coronary artery of anterior wall: Principal | ICD-10-CM | POA: Diagnosis present

## 2015-12-21 DIAGNOSIS — Z91038 Other insect allergy status: Secondary | ICD-10-CM | POA: Diagnosis not present

## 2015-12-21 DIAGNOSIS — E785 Hyperlipidemia, unspecified: Secondary | ICD-10-CM | POA: Diagnosis present

## 2015-12-21 DIAGNOSIS — I10 Essential (primary) hypertension: Secondary | ICD-10-CM | POA: Diagnosis present

## 2015-12-21 DIAGNOSIS — Z7982 Long term (current) use of aspirin: Secondary | ICD-10-CM

## 2015-12-21 DIAGNOSIS — K449 Diaphragmatic hernia without obstruction or gangrene: Secondary | ICD-10-CM | POA: Diagnosis present

## 2015-12-21 DIAGNOSIS — F909 Attention-deficit hyperactivity disorder, unspecified type: Secondary | ICD-10-CM | POA: Diagnosis present

## 2015-12-21 DIAGNOSIS — I2583 Coronary atherosclerosis due to lipid rich plaque: Secondary | ICD-10-CM | POA: Diagnosis present

## 2015-12-21 DIAGNOSIS — R1013 Epigastric pain: Secondary | ICD-10-CM | POA: Diagnosis present

## 2015-12-21 DIAGNOSIS — Z95810 Presence of automatic (implantable) cardiac defibrillator: Secondary | ICD-10-CM

## 2015-12-21 DIAGNOSIS — Z951 Presence of aortocoronary bypass graft: Secondary | ICD-10-CM | POA: Diagnosis not present

## 2015-12-21 DIAGNOSIS — I213 ST elevation (STEMI) myocardial infarction of unspecified site: Secondary | ICD-10-CM

## 2015-12-21 DIAGNOSIS — K219 Gastro-esophageal reflux disease without esophagitis: Secondary | ICD-10-CM | POA: Diagnosis present

## 2015-12-21 DIAGNOSIS — R739 Hyperglycemia, unspecified: Secondary | ICD-10-CM | POA: Diagnosis present

## 2015-12-21 DIAGNOSIS — I255 Ischemic cardiomyopathy: Secondary | ICD-10-CM | POA: Diagnosis present

## 2015-12-21 DIAGNOSIS — K760 Fatty (change of) liver, not elsewhere classified: Secondary | ICD-10-CM | POA: Diagnosis present

## 2015-12-21 DIAGNOSIS — R109 Unspecified abdominal pain: Secondary | ICD-10-CM

## 2015-12-21 DIAGNOSIS — K5730 Diverticulosis of large intestine without perforation or abscess without bleeding: Secondary | ICD-10-CM | POA: Diagnosis present

## 2015-12-21 DIAGNOSIS — Z8546 Personal history of malignant neoplasm of prostate: Secondary | ICD-10-CM

## 2015-12-21 DIAGNOSIS — Z8249 Family history of ischemic heart disease and other diseases of the circulatory system: Secondary | ICD-10-CM | POA: Diagnosis not present

## 2015-12-21 DIAGNOSIS — I7100 Dissection of unspecified site of aorta: Secondary | ICD-10-CM

## 2015-12-21 HISTORY — DX: Acute myocardial infarction, unspecified: I21.9

## 2015-12-21 HISTORY — PX: CARDIAC CATHETERIZATION: SHX172

## 2015-12-21 HISTORY — DX: Presence of automatic (implantable) cardiac defibrillator: Z95.810

## 2015-12-21 LAB — CBC WITH DIFFERENTIAL/PLATELET
Basophils Absolute: 0 10*3/uL (ref 0.0–0.1)
Basophils Relative: 0 %
EOS PCT: 1 %
Eosinophils Absolute: 0.1 10*3/uL (ref 0.0–0.7)
HCT: 44.6 % (ref 39.0–52.0)
Hemoglobin: 15 g/dL (ref 13.0–17.0)
LYMPHS ABS: 1.7 10*3/uL (ref 0.7–4.0)
Lymphocytes Relative: 13 %
MCH: 29.7 pg (ref 26.0–34.0)
MCHC: 33.6 g/dL (ref 30.0–36.0)
MCV: 88.3 fL (ref 78.0–100.0)
MONO ABS: 0.7 10*3/uL (ref 0.1–1.0)
Monocytes Relative: 5 %
NEUTROS ABS: 10.2 10*3/uL — AB (ref 1.7–7.7)
Neutrophils Relative %: 81 %
Platelets: 183 10*3/uL (ref 150–400)
RBC: 5.05 MIL/uL (ref 4.22–5.81)
RDW: 14.2 % (ref 11.5–15.5)
WBC: 12.7 10*3/uL — ABNORMAL HIGH (ref 4.0–10.5)

## 2015-12-21 LAB — COMPREHENSIVE METABOLIC PANEL
ALBUMIN: 4.1 g/dL (ref 3.5–5.0)
ALT: 19 U/L (ref 17–63)
AST: 19 U/L (ref 15–41)
Alkaline Phosphatase: 85 U/L (ref 38–126)
Anion gap: 14 (ref 5–15)
BUN: 12 mg/dL (ref 6–20)
CHLORIDE: 106 mmol/L (ref 101–111)
CO2: 24 mmol/L (ref 22–32)
Calcium: 9.4 mg/dL (ref 8.9–10.3)
Creatinine, Ser: 1.11 mg/dL (ref 0.61–1.24)
GFR calc Af Amer: >60 mL/min (ref >60)
GFR calc non Af Amer: >60 mL/min (ref >60)
Glucose, Bld: 156 mg/dL — ABNORMAL HIGH (ref 65–99)
Potassium: 4.3 mmol/L (ref 3.5–5.1)
SODIUM: 144 mmol/L (ref 135–145)
Total Bilirubin: 1.7 mg/dL — ABNORMAL HIGH (ref 0.3–1.2)
Total Protein: 6.6 g/dL (ref 6.5–8.1)

## 2015-12-21 LAB — TROPONIN I: Troponin I: <0.03 ng/mL (ref <0.031)

## 2015-12-21 LAB — HEPATIC FUNCTION PANEL
ALT: 18 U/L (ref 17–63)
AST: 22 U/L (ref 15–41)
Albumin: 3.7 g/dL (ref 3.5–5.0)
Alkaline Phosphatase: 79 U/L (ref 38–126)
BILIRUBIN DIRECT: 0.4 mg/dL (ref 0.1–0.5)
BILIRUBIN INDIRECT: 1.5 mg/dL — AB (ref 0.3–0.9)
TOTAL PROTEIN: 6.5 g/dL (ref 6.5–8.1)
Total Bilirubin: 1.9 mg/dL — ABNORMAL HIGH (ref 0.3–1.2)

## 2015-12-21 LAB — PROTIME-INR
INR: 1.08 (ref 0.00–1.49)
Prothrombin Time: 14.2 seconds (ref 11.6–15.2)

## 2015-12-21 LAB — I-STAT TROPONIN, ED: Troponin i, poc: 0 ng/mL (ref 0.00–0.08)

## 2015-12-21 LAB — APTT: aPTT: 25 seconds (ref 24–37)

## 2015-12-21 SURGERY — LEFT HEART CATH AND CORONARY ANGIOGRAPHY

## 2015-12-21 MED ORDER — ONDANSETRON HCL 4 MG/2ML IJ SOLN: 4.0000 mg | Freq: Four times a day (QID) | INTRAMUSCULAR | Status: DC | PRN

## 2015-12-21 MED ORDER — IOHEXOL 350 MG/ML SOLN
100.0000 mL | Freq: Once | INTRAVENOUS | Status: AC | PRN
  Administered 2015-12-21: 100 mL via INTRAVENOUS

## 2015-12-21 MED ORDER — BUPROPION HCL ER (XL) 150 MG PO TB24
150.0000 mg | ORAL_TABLET | Freq: Every day | ORAL | Status: DC
  Administered 2015-12-21 – 2015-12-23 (×3): 150 mg via ORAL
  Filled 2015-12-21 (×3): qty 1

## 2015-12-21 MED ORDER — PANTOPRAZOLE SODIUM 40 MG PO TBEC: 40.0000 mg | DELAYED_RELEASE_TABLET | Freq: Every day | ORAL | Status: DC

## 2015-12-21 MED ORDER — SODIUM CHLORIDE 0.9 % IV SOLN: 250.0000 mL | INTRAVENOUS | Status: DC | PRN

## 2015-12-21 MED ORDER — ZOLPIDEM TARTRATE 5 MG PO TABS
5.0000 mg | ORAL_TABLET | Freq: Every evening | ORAL | Status: DC | PRN
  Administered 2015-12-22: 5 mg via ORAL
  Filled 2015-12-21: qty 1

## 2015-12-21 MED ORDER — AMLODIPINE BESYLATE 5 MG PO TABS
5.0000 mg | ORAL_TABLET | Freq: Every day | ORAL | Status: DC
  Administered 2015-12-21 – 2015-12-23 (×3): 5 mg via ORAL
  Filled 2015-12-21 (×3): qty 1

## 2015-12-21 MED ORDER — ACETAMINOPHEN 325 MG PO TABS: 650.0000 mg | ORAL_TABLET | ORAL | Status: DC | PRN

## 2015-12-21 MED ORDER — MIDAZOLAM HCL 2 MG/2ML IJ SOLN
INTRAMUSCULAR | Status: DC | PRN
  Administered 2015-12-21: 1 mg via INTRAVENOUS

## 2015-12-21 MED ORDER — MIDAZOLAM HCL 2 MG/2ML IJ SOLN
INTRAMUSCULAR | Status: AC
  Filled 2015-12-21: qty 2

## 2015-12-21 MED ORDER — OCUVITE PO TABS
1.0000 | ORAL_TABLET | Freq: Every day | ORAL | Status: DC
  Administered 2015-12-22 – 2015-12-23 (×2): 1 via ORAL
  Filled 2015-12-21 (×2): qty 1

## 2015-12-21 MED ORDER — ESCITALOPRAM OXALATE 20 MG PO TABS: 20.0000 mg | ORAL_TABLET | Freq: Every day | ORAL | Status: DC

## 2015-12-21 MED ORDER — BUSPIRONE HCL 10 MG PO TABS
30.0000 mg | ORAL_TABLET | Freq: Every day | ORAL | Status: DC
  Administered 2015-12-21 – 2015-12-23 (×3): 30 mg via ORAL
  Filled 2015-12-21 (×3): qty 3

## 2015-12-21 MED ORDER — ADULT MULTIVITAMIN W/MINERALS CH
1.0000 | ORAL_TABLET | Freq: Every day | ORAL | Status: DC
  Administered 2015-12-22: 10:00:00 1 via ORAL
  Filled 2015-12-21 (×3): qty 1

## 2015-12-21 MED ORDER — CARVEDILOL 12.5 MG PO TABS
25.0000 mg | ORAL_TABLET | Freq: Two times a day (BID) | ORAL | Status: DC
  Administered 2015-12-21 – 2015-12-23 (×4): 25 mg via ORAL
  Filled 2015-12-21 (×4): qty 2

## 2015-12-21 MED ORDER — PANTOPRAZOLE SODIUM 40 MG PO TBEC
40.0000 mg | DELAYED_RELEASE_TABLET | Freq: Every day | ORAL | Status: DC
  Administered 2015-12-21 – 2015-12-23 (×3): 40 mg via ORAL
  Filled 2015-12-21 (×3): qty 1

## 2015-12-21 MED ORDER — MORPHINE SULFATE (PF) 2 MG/ML IV SOLN
2.0000 mg | INTRAVENOUS | Status: DC | PRN
Start: 2015-12-21 — End: 2015-12-23

## 2015-12-21 MED ORDER — ASPIRIN EC 81 MG PO TBEC: 81.0000 mg | DELAYED_RELEASE_TABLET | Freq: Every day | ORAL | Status: DC

## 2015-12-21 MED ORDER — IOHEXOL 350 MG/ML SOLN
INTRAVENOUS | Status: DC | PRN
  Administered 2015-12-21: 80 mL via INTRACARDIAC

## 2015-12-21 MED ORDER — IRBESARTAN 75 MG PO TABS
150.0000 mg | ORAL_TABLET | Freq: Every day | ORAL | Status: DC
  Administered 2015-12-21 – 2015-12-23 (×3): 150 mg via ORAL
  Filled 2015-12-21 (×3): qty 2

## 2015-12-21 MED ORDER — HEPARIN (PORCINE) IN NACL 2-0.9 UNIT/ML-% IJ SOLN
INTRAMUSCULAR | Status: AC
  Filled 2015-12-21: qty 1000

## 2015-12-21 MED ORDER — ESCITALOPRAM OXALATE 20 MG PO TABS
20.0000 mg | ORAL_TABLET | Freq: Every day | ORAL | Status: DC
  Administered 2015-12-21 – 2015-12-23 (×3): 20 mg via ORAL
  Filled 2015-12-21 (×3): qty 1

## 2015-12-21 MED ORDER — SODIUM CHLORIDE 0.45 % IV SOLN: INTRAVENOUS | Status: AC

## 2015-12-21 MED ORDER — ATORVASTATIN CALCIUM 80 MG PO TABS
80.0000 mg | ORAL_TABLET | Freq: Every day | ORAL | Status: DC
  Administered 2015-12-22 – 2015-12-23 (×2): 80 mg via ORAL
  Filled 2015-12-21 (×3): qty 1

## 2015-12-21 MED ORDER — LIDOCAINE HCL (PF) 1 % IJ SOLN
INTRAMUSCULAR | Status: AC
  Filled 2015-12-21: qty 30

## 2015-12-21 MED ORDER — FENTANYL CITRATE (PF) 100 MCG/2ML IJ SOLN
INTRAMUSCULAR | Status: AC
  Filled 2015-12-21: qty 2

## 2015-12-21 MED ORDER — BUPROPION HCL ER (XL) 150 MG PO TB24: 150.0000 mg | ORAL_TABLET | Freq: Every day | ORAL | Status: DC

## 2015-12-21 MED ORDER — SODIUM CHLORIDE 0.9 % IJ SOLN: 3.0000 mL | INTRAMUSCULAR | Status: DC | PRN

## 2015-12-21 MED ORDER — FENTANYL CITRATE (PF) 100 MCG/2ML IJ SOLN
INTRAMUSCULAR | Status: DC | PRN
  Administered 2015-12-21: 25 ug via INTRAVENOUS

## 2015-12-21 MED ORDER — ASPIRIN 81 MG PO CHEW: 324.0000 mg | CHEWABLE_TABLET | Freq: Once | ORAL | Status: DC

## 2015-12-21 MED ORDER — SODIUM CHLORIDE 0.9 % IV SOLN: 20.0000 mL | INTRAVENOUS | Status: DC

## 2015-12-21 MED ORDER — SODIUM CHLORIDE 0.45 % IV SOLN: INTRAVENOUS | Status: DC

## 2015-12-21 MED ORDER — SODIUM CHLORIDE 0.9 % WEIGHT BASED INFUSION: 3.0000 mL/kg/h | INTRAVENOUS | Status: DC

## 2015-12-21 MED ORDER — NITROGLYCERIN 0.4 MG SL SUBL: 0.4000 mg | SUBLINGUAL_TABLET | SUBLINGUAL | Status: DC | PRN

## 2015-12-21 MED ORDER — ASPIRIN 81 MG PO CHEW
81.0000 mg | CHEWABLE_TABLET | Freq: Every day | ORAL | Status: DC
  Administered 2015-12-22 – 2015-12-23 (×2): 81 mg via ORAL
  Filled 2015-12-21 (×2): qty 1

## 2015-12-21 MED ORDER — SODIUM CHLORIDE 0.9 % IJ SOLN: 3.0000 mL | Freq: Two times a day (BID) | INTRAMUSCULAR | Status: DC

## 2015-12-21 SURGICAL SUPPLY — 10 items
CATH INFINITI 5FR MULTPACK ANG (CATHETERS) ×2 IMPLANT
DEVICE CLOSURE MYNXGRIP 6/7F (Vascular Products) ×2 IMPLANT
HOVERMATT SINGLE USE (MISCELLANEOUS) ×2 IMPLANT
KIT HEART LEFT (KITS) ×2 IMPLANT
PACK CARDIAC CATHETERIZATION (CUSTOM PROCEDURE TRAY) ×2 IMPLANT
SHEATH PINNACLE 6F 10CM (SHEATH) ×2 IMPLANT
SYR MEDRAD MARK V 150ML (SYRINGE) ×2 IMPLANT
TRANSDUCER W/STOPCOCK (MISCELLANEOUS) ×2 IMPLANT
WIRE EMERALD 3MM-J .035X150CM (WIRE) ×2 IMPLANT
WIRE HI TORQ VERSACORE-J 145CM (WIRE) ×2 IMPLANT

## 2015-12-21 NOTE — ED Provider Notes (Addendum)
CSN: [id number]     Arrival date & time 12/21/15  1326 History   First MD Initiated Contact with Patient 12/21/15 1335     Chief Complaint  Patient presents with  . Abdominal Pain     (Consider location/radiation/quality/duration/timing/severity/associated sxs/prior Treatment) HPI 70 year old man known history of coronary artery disease status post bypass graft in past, history of implantable cardiac defibrillator who presents today with onset of epigastric pain 2 hours prior to evaluation. He describes it as pressure radiating around under his left arm. He has not had associated dyspnea but has had associated diaphoresis. He states the pain is in a different place from his previous cardiac pain but is similar in nature. Pain was 9 out of 10 prior to gait being given medications and route by EMS and has improved to 7 out of 10. Nausea with one episode of vomiting. He has been well until this started. Past Medical History  Diagnosis Date  . Adult ADHD   . Malignant neoplasm of tonsil ([place])   . 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 Esophageal stricture   . Dyspepsia   . CAD (coronary artery disease)     Cath 2008, all grafts patent,   . HTN (hypertension)   . Hyperlipidemia   . GERD (gastroesophageal reflux disease)   . Thrombosis of arm     Left  . Elevated PSA 02/15/2011  . ICD (implantable cardiac defibrillator) battery depletion     Placed 2008  (eepisodes  nonsustained monomorphic VT in the past)  . Hx of CABG     1991  . Venous thrombosis     Left arm  . Carotid artery disease ([place])     Doppler, February, 2012, mild plaque, 0-39% bilateral, plan followup in one year  . Prostate cancer ([place]) 2012    IMRT, ADT  . Ejection fraction < 50%    Past Surgical History  Procedure Laterality Date  . Coronary artery bypass graft      LIMA_LAD, SVG-OM, Cx, RCA '91  . Rotator cuff repair  2005, 2007  .  Tonsillectomy  2005    Malignant lesion, w/XRT  . Cystectomy      Hand  . Icd placement    . Thumb surgery    . 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 Cardiac catheterization  12/05/06  . Menistectomies, left knee  '09  . Implantable cardioverter defibrillator (icd) generator change  04/06/2015    Procedure: Icd Generator Change;  Surgeon: Evans Lance, MD;  Location: [facility];  Service: Cardiovascular;;   Family History  Problem Relation Age of Onset  . Alzheimer's disease Mother   . Dementia Mother     living in [place]  . Hyperlipidemia Father   . Hypertension Father   . CAD Father     had pacemaker  . Heart disease Brother     Bypass surgery  . Colon cancer Neg Hx   . Prostate cancer Neg Hx   . Diabetes Neg Hx   . Breast cancer Sister     survivor  . Congestive Heart Failure Father    Social History  Substance Use Topics  . Smoking status: Never Smoker   . Smokeless tobacco: None  . Alcohol Use: 14.4 oz/week    24 Cans of beer per week    Review  of Systems  All other systems reviewed and are negative.     Allergies  Other  Home Medications   Prior to Admission medications   Medication Sig Start Date End Date Taking? Authorizing Provider  amLODipine (NORVASC) 5 MG tablet Take 1 tablet (5 mg total) by mouth daily. 01/05/15   Carlena Bjornstad, MD  aspirin 81 MG EC tablet Take 81 mg by mouth daily.      Historical Provider, MD  atorvastatin (LIPITOR) 80 MG tablet TAKE 1 TABLET BY MOUTH EVERY DAY 02/24/15   Carlena Bjornstad, MD  buPROPion (WELLBUTRIN XL) 150 MG 24 hr tablet Take 150 mg by mouth daily. 07/14/15   Historical Provider, MD  busPIRone (BUSPAR) 30 MG tablet Take 30 mg by mouth daily.    Historical Provider, MD  carvedilol (COREG) 25 MG tablet TAKE 1 TABLET BY MOUTH TWICE A DAY 10/05/15   Carlena Bjornstad, MD  EPINEPHrine (EPIPEN) 0.3 mg/0.3 mL DEVI Inject 0.3 mLs (0.3 mg total) into the muscle as needed.  06/09/12   Virgel Manifold, MD  escitalopram (LEXAPRO) 20 MG tablet Take 20 mg by mouth daily.      Historical Provider, MD  esomeprazole (NEXIUM) 20 MG capsule Take 20 mg by mouth daily at 12 noon.    Historical Provider, MD  Multiple Vitamins-Minerals (MULTIVITAMIN,TX-MINERALS) tablet Take 1 tablet by mouth daily.      Historical Provider, MD  Multiple Vitamins-Minerals (OCUVITE PRESERVISION) TABS Take 1 tablet by mouth daily.     Historical Provider, MD  NON FORMULARY Joint Juice: 8 oz daily     Historical Provider, MD  pantoprazole (PROTONIX) 40 MG tablet Take 1 tablet (40 mg total) by mouth daily. 01/13/15   Hoyt Koch, MD  RABEprazole (ACIPHEX) 20 MG tablet Take 1 tablet (20 mg total) by mouth daily. 02/16/15   Hoyt Koch, MD  valsartan (DIOVAN) 160 MG tablet TAKE 1 TABLET BY MOUTH TWICE A DAY 10/07/15   Carlena Bjornstad, MD  zolpidem (AMBIEN) 10 MG tablet Take 1 tablet by mouth at bedtime as needed for sleep.  02/28/14   Historical Provider, MD   BP 146/78 mmHg  Pulse 56  Temp(Src) 97.9 F (36.6 C) (Oral)  Resp 15  SpO2 93% Physical Exam  Constitutional: He is oriented to person, place, and time. He appears well-developed and well-nourished.  HENT:  Head: Normocephalic and atraumatic.  Eyes: Conjunctivae are normal. Pupils are equal, round, and reactive to light.  Neck: Normal range of motion. Neck supple.  Cardiovascular: Normal rate and regular rhythm.   Pulmonary/Chest: Effort normal and breath sounds normal.  Abdominal: Soft.  Mild epigastric ttp  Musculoskeletal: Normal range of motion.  Neurological: He is alert and oriented to person, place, and time.  Skin: There is pallor.  Diaphoretic  Psychiatric: He has a normal mood and affect.  Nursing note and vitals reviewed.   ED Course  Procedures (including critical care time) Labs Review Labs Reviewed  COMPREHENSIVE METABOLIC PANEL  CBC WITH DIFFERENTIAL/PLATELET  APTT  PROTIME-INR  I-STAT TROPOININ, ED     Imaging Review No results found. I have personally reviewed and evaluated these images and lab results as part of my medical decision-making.   EKG Interpretation   Date/Time:  Monday December 21 2015 13:34:41 EST Ventricular Rate:  54 PR Interval:  178 QRS Duration: 150 QT Interval:  508 QTC Calculation: 481 R Axis:   -54 Text Interpretation:  Sinus bradycardia with occasional Premature  ventricular complexes  and Possible Premature atrial complexes with  Abberant conduction Left axis deviation Non-specific intra-ventricular  conduction block Abnormal ECG st elevation v1-3- appears increased  although present on previous ekg of 06/09/12 Confirmed by Trudee Chirino MD, Andee Poles  [phone]) on 12/21/2015 1:40:29 PM      MDM   Final diagnoses:  ST elevation myocardial infarction (STEMI), unspecified artery ([place])    70 year old man with sudden severe onset of epigastric pain with EKG with ST elevation in V1 through V3 with some nonspecific lateral depression. Patient initially patient has STEMI. He appears very pale and ill-appearing but vital signs are stable. I have opted to do a CT angiogram of the chest and abdomen due to the epigastric location and radiating nature versus pain to assure that this is not thoracic dissection or abdominal aortic aneurysm rupture  Discussed with Dr. Gwenlyn Found and he is in agreement with above plan. CT obtained and I reviewed with Dr. Ardeen Garland. CT is negative for dissection or rupturing aneurysm. Patient taken to Cath Lab 2:21 PM CRITICAL CARE Performed by: Shaune Pollack Total critical care time: 60 minutes Critical care time was exclusive of separately billable procedures and treating other patients. Critical care was necessary to treat or prevent imminent or life-threatening deterioration. Critical care was time spent personally by me on the following activities: development of treatment plan with patient and/or surrogate as well as nursing, discussions with  consultants, evaluation of patient's response to treatment, examination of patient, obtaining history from patient or surrogate, ordering and performing treatments and interventions, ordering and review of laboratory studies, ordering and review of radiographic studies, pulse oximetry and re-evaluation of patient's condition.   Pattricia Boss, MD 12/21/15 1422  Dr. Ardeen Garland called with report that he has some question of stranding around the gallbladder bladder on his vital read. Patient has already gone to Cath Lab.   Pattricia Boss, MD 12/21/15 [phone]

## 2015-12-21 NOTE — ED Notes (Signed)
Epigastric pain sudden onset; has nausea with pain and vomited once not coffee ground Cardiac hx- defibrillator and pacemaker. Drinks alcohol on occasion. Pain Radiates to back. Has not eaten or taken meds today.

## 2015-12-21 NOTE — ED Notes (Signed)
Pt has had 150 fentanyl and zofran per EMS

## 2015-12-21 NOTE — H&P (Signed)
Patient ID: [name] MRN: [id number], DOB/AGE: Jun 10, 1946   Admit date: 12/21/2015   Primary Physician: Hoyt Koch, MD Primary Cardiologist: Dr. Ron Parker  Pt. Profile:  70 y/o male with known CAD s/p CABG in 1999, ICM, HTN and HLD presenting as a CODE STEMI with CP and ST elevations in V1-V3  Problem List  Past Medical History  Diagnosis Date  . Adult ADHD   . 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 Esophageal stricture   . Dyspepsia   . CAD (coronary artery disease)     Cath 2008, all grafts patent,   . HTN (hypertension)   . Hyperlipidemia   . GERD (gastroesophageal reflux disease)   . Thrombosis of arm     Left  . Elevated PSA 02/15/2011  . ICD (implantable cardiac defibrillator) battery depletion     Placed 2008  (eepisodes  nonsustained monomorphic VT in the past)  . Hx of CABG     1991  . Venous thrombosis     Left arm  . Carotid artery disease ([place])     Doppler, February, 2012, mild plaque, 0-39% bilateral, plan followup in one year  . Prostate cancer ([place]) 2012    IMRT, ADT  . Ejection fraction < 50%     Past Surgical History  Procedure Laterality Date  . Coronary artery bypass graft      LIMA_LAD, SVG-OM, Cx, RCA '91  . Rotator cuff repair  2005, 2007  . Tonsillectomy  2005    Malignant lesion, w/XRT  . Cystectomy      Hand  . Icd placement    . Thumb surgery    . 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 Cardiac catheterization  12/05/06  . Menistectomies, left knee  '09  . Implantable cardioverter defibrillator (icd) generator change  04/06/2015    Procedure: Icd Generator Change;  Surgeon: Evans Lance, MD;  Location: [facility];  Service: Cardiovascular;;     Allergies  Allergies  Allergen Reactions  . Other Anaphylaxis    Insect bites (Has EpiPen)     HPI  70 y/o male, previously followed by Dr. Ron Parker, presenting to the ED as a CODE STEMI. He has known coronary disease, s/p CABG in 1999 with a LIMA-LAD and SVGs to the OM, CX and RCA. LHC in 2008 showed patent grafts. He has h/o ICM with EF of 20-25% s/p ICD, followed by Dr. Lovena Le. He also has a h/o HTN and HLD.  He presented to the ED today with a complaint of sudden onset severe mid epigastric pain radiating to his back. Symptoms started at approximately 11 AM, 2 hrs prior to presentation. On arrival to the ED, he was pale and diaphoretic. EKG showed new ST elevations in leads V1-V3. Prior to going to cath lab a STAT CT of the chest was performed in the ED and was negative for dissection. 100 cc of contrast was used for CT scan. He was then taken urgently to the cath lab for emergent cardiac catheterization, per Dr. Gwenlyn Found.    Home Medications  Prior to Admission medications   Medication Sig Start Date End Date Taking? Authorizing Provider  amLODipine (NORVASC) 5 MG tablet Take 1 tablet (5 mg total) by mouth daily. 01/05/15   Leonie Douglas  Ron Parker, MD  aspirin 81 MG EC tablet Take 81 mg by mouth daily.      Historical Provider, MD  atorvastatin (LIPITOR) 80 MG tablet TAKE 1 TABLET BY MOUTH EVERY DAY 02/24/15   Carlena Bjornstad, MD  buPROPion (WELLBUTRIN XL) 150 MG 24 hr tablet Take 150 mg by mouth daily. 07/14/15   Historical Provider, MD  busPIRone (BUSPAR) 30 MG tablet Take 30 mg by mouth daily.    Historical Provider, MD  carvedilol (COREG) 25 MG tablet TAKE 1 TABLET BY MOUTH TWICE A DAY 10/05/15   Carlena Bjornstad, MD  EPINEPHrine (EPIPEN) 0.3 mg/0.3 mL DEVI Inject 0.3 mLs (0.3 mg total) into the muscle as needed. 06/09/12   Virgel Manifold, MD  escitalopram (LEXAPRO) 20 MG tablet Take 20 mg by mouth daily.      Historical Provider, MD  esomeprazole (NEXIUM) 20 MG capsule Take 20 mg by mouth daily at 12 noon.    Historical Provider, MD  Multiple Vitamins-Minerals (MULTIVITAMIN,TX-MINERALS) tablet Take 1  tablet by mouth daily.      Historical Provider, MD  Multiple Vitamins-Minerals (OCUVITE PRESERVISION) TABS Take 1 tablet by mouth daily.     Historical Provider, MD  NON FORMULARY Joint Juice: 8 oz daily     Historical Provider, MD  pantoprazole (PROTONIX) 40 MG tablet Take 1 tablet (40 mg total) by mouth daily. 01/13/15   Hoyt Koch, MD  RABEprazole (ACIPHEX) 20 MG tablet Take 1 tablet (20 mg total) by mouth daily. 02/16/15   Hoyt Koch, MD  valsartan (DIOVAN) 160 MG tablet TAKE 1 TABLET BY MOUTH TWICE A DAY 10/07/15   Carlena Bjornstad, MD  zolpidem (AMBIEN) 10 MG tablet Take 1 tablet by mouth at bedtime as needed for sleep.  02/28/14   Historical Provider, MD    Family History  Family History  Problem Relation Age of Onset  . Alzheimer's disease Mother   . Dementia Mother     living in [place]  . Hyperlipidemia Father   . Hypertension Father   . CAD Father     had pacemaker  . Heart disease Brother     Bypass surgery  . Colon cancer Neg Hx   . Prostate cancer Neg Hx   . Diabetes Neg Hx   . Breast cancer Sister     survivor  . Congestive Heart Failure Father     Social History  Social History   Social History  . Marital Status: Married    Spouse Name: N/A  . Number of Children: 0  . Years of Education: N/A   Occupational History  . Retired     Contractor for [employer]  . Smoking status: Never Smoker   . Smokeless tobacco: Not on file  . Alcohol Use: 14.4 oz/week    24 Cans of beer per week  . Drug Use: No  . Sexual Activity: Yes    Birth Control/ Protection: None   Other Topics Concern  . Not on file   Social History Narrative   [employer]. 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 Marriage in good health, wife in good health. ACP discussed Jan '15: no CPR, no mechanical ventilation. He is referred to [email or web address] and provided with packet with HCPOA-his wife./ Advanced directive.     Review of  Systems General:  No chills, fever, night sweats or weight changes.  Cardiovascular:  No chest pain, dyspnea on exertion, edema, orthopnea,  palpitations, paroxysmal nocturnal dyspnea. Dermatological: No rash, lesions/masses Respiratory: No cough, dyspnea Urologic: No hematuria, dysuria Abdominal:   No nausea, vomiting, diarrhea, bright red blood per rectum, melena, or hematemesis Neurologic:  No visual changes, wkns, changes in mental status. All other systems reviewed and are otherwise negative except as noted above.  Physical Exam  Blood pressure 146/78, pulse 56, temperature 97.9 F (36.6 C), temperature source Oral, resp. rate 15, SpO2 93 %.  General: Pleasant, mildly anxious but no distress Psych: Normal affect. Neuro: Alert and oriented X 3. Moves all extremities spontaneously. HEENT: Normal  Neck: Supple without bruits or JVD. Lungs:  Resp regular and unlabored, CTA. Heart: RRR no s3, s4, or murmurs. Abdomen: Soft, non-tender, non-distended, BS + x 4.  Extremities: No clubbing, cyanosis or edema. DP/PT/Radials 2+ and equal bilaterally.  Labs  Troponin [facility])  Recent Labs  12/21/15 1405  TROPIPOC 0.00   No results for input(s): CKTOTAL, CKMB, TROPONINI in the last 72 hours. Lab Results  Component Value Date   WBC 12.7* 12/21/2015   HGB 15.0 12/21/2015   HCT 44.6 12/21/2015   MCV 88.3 12/21/2015   PLT 183 12/21/2015   No results for input(s): NA, K, CL, CO2, BUN, CREATININE, CALCIUM, PROT, BILITOT, ALKPHOS, ALT, AST, GLUCOSE in the last 168 hours.  Invalid input(s): LABALBU Lab Results  Component Value Date   CHOL 191 01/13/2015   HDL 64.60 01/13/2015   LDLCALC 89 01/13/2015   TRIG 189.0* 01/13/2015   No results found for: DDIMER   Radiology/Studies  Chest CT: 12/21/15 IMPRESSION: No evidence of aortic aneurysm or dissection. Mild infrarenal atherosclerotic change in the aorta.  Fatty liver.  Possible gallbladder wall thickening and  small amount of stranding inferior to the gallbladder. Cannot exclude acute cholecystitis. If there is clinical concern, recommend right upper quadrant ultrasound.  Moderate-sized hiatal hernia.  Coronary artery disease, prior CABG.  Left colonic diverticulosis.   ECG  ST elevations in leads V1-V3    ASSESSMENT AND PLAN   1. ACS/STEMI: CT negative for dissection. EKG with ST elevations in leads V1-V3. Patient taken to cath lab for emergent LHC. Cath showed patient grafts no acute lesions to explain abnormal EKG. Suspect GI etiology. Will monitor overnight. Home tomorrow if stable.   2. CAD: s/p CABG in 1999 with a LIMA-LAD and SVGs to the OM, CX and RCA. LHC in 2008 showed patent grafts. EKG concerning for STEMI, however emergent cath shows patent grafts and no new lesions. Continue medical therapy for CAD.   3. ICM: EF of 20-25% on recent echo 07/2015. Has an ICD, followed by Dr. Lovena Le.   4. Post Cath: patient will need post cath hydration and close monitoring of renal function given contrast used for chest CT and [facility]. Admission Scr was 1.11. F/u BMP in the am.    Signed, Lyda Jester, PA-C 12/21/2015, 2:28 PM  Agree with note written by Ellen Henri  [facility]  Patient known to [place] with sudden onset /epigastric pain and back pain. History of CAD status post bypass His last cath in 2012 showed patent grafts. A CT scan showed no evidence of aortic dissection. His EKG did show new ST segment, V2 and V3. His exam is benign. Because of this It was elected to bring him to the cardiac catheterization laboratory urgently for angiography and potential intervention  Quay Burow 12/21/2015 2:55 PM

## 2015-12-22 ENCOUNTER — Inpatient Hospital Stay (HOSPITAL_COMMUNITY): Payer: Medicare Other

## 2015-12-22 ENCOUNTER — Encounter (HOSPITAL_COMMUNITY): Payer: Self-pay | Admitting: Cardiovascular Disease

## 2015-12-22 DIAGNOSIS — R1013 Epigastric pain: Secondary | ICD-10-CM

## 2015-12-22 LAB — BASIC METABOLIC PANEL
Anion gap: 12 (ref 5–15)
BUN: 17 mg/dL (ref 6–20)
CALCIUM: 8.9 mg/dL (ref 8.9–10.3)
CO2: 23 mmol/L (ref 22–32)
CREATININE: 1.31 mg/dL — AB (ref 0.61–1.24)
Chloride: 104 mmol/L (ref 101–111)
GFR calc Af Amer: >60 mL/min (ref >60)
GFR calc non Af Amer: 54 mL/min — ABNORMAL LOW (ref >60)
Glucose, Bld: 150 mg/dL — ABNORMAL HIGH (ref 65–99)
POTASSIUM: 4 mmol/L (ref 3.5–5.1)
SODIUM: 139 mmol/L (ref 135–145)

## 2015-12-22 LAB — CBC
HCT: 40.6 % (ref 39.0–52.0)
HEMOGLOBIN: 13.3 g/dL (ref 13.0–17.0)
MCH: 29 pg (ref 26.0–34.0)
MCHC: 32.8 g/dL (ref 30.0–36.0)
MCV: 88.5 fL (ref 78.0–100.0)
PLATELETS: 151 10*3/uL (ref 150–400)
RBC: 4.59 MIL/uL (ref 4.22–5.81)
RDW: 14.4 % (ref 11.5–15.5)
WBC: 14.3 10*3/uL — ABNORMAL HIGH (ref 4.0–10.5)

## 2015-12-22 LAB — HEPATIC FUNCTION PANEL
ALK PHOS: 74 U/L (ref 38–126)
ALT: 26 U/L (ref 17–63)
AST: 37 U/L (ref 15–41)
Albumin: 3.3 g/dL — ABNORMAL LOW (ref 3.5–5.0)
BILIRUBIN INDIRECT: 1.4 mg/dL — AB (ref 0.3–0.9)
Bilirubin, Direct: 0.4 mg/dL (ref 0.1–0.5)
TOTAL PROTEIN: 6 g/dL — AB (ref 6.5–8.1)
Total Bilirubin: 1.8 mg/dL — ABNORMAL HIGH (ref 0.3–1.2)

## 2015-12-22 LAB — TROPONIN I

## 2015-12-22 LAB — AMYLASE: AMYLASE: 43 U/L (ref 28–100)

## 2015-12-22 LAB — LIPASE, BLOOD: Lipase: 17 U/L (ref 11–51)

## 2015-12-22 MED ORDER — ENOXAPARIN SODIUM 40 MG/0.4ML ~~LOC~~ SOLN
40.0000 mg | Freq: Every day | SUBCUTANEOUS | Status: DC
Start: 2015-12-22 — End: 2015-12-23
  Administered 2015-12-22: 14:00:00 40 mg via SUBCUTANEOUS
  Filled 2015-12-22 (×2): qty 0.4

## 2015-12-22 MED FILL — Lidocaine HCl Local Preservative Free (PF) Inj 1%: INTRAMUSCULAR | Qty: 30 | Status: AC

## 2015-12-22 MED FILL — Heparin Sodium (Porcine) 2 Unit/ML in Sodium Chloride 0.9%: INTRAMUSCULAR | Qty: 500 | Status: AC

## 2015-12-22 NOTE — Progress Notes (Signed)
Patient Name: [name] Date of Encounter: 12/22/2015     Active Problems:   CAD (coronary artery disease)   ST elevation myocardial infarction (STEMI) [facility])   Epigastric pain   Atypical chest pain    SUBJECTIVE  No more chest pain or SOB. No complaints  CURRENT MEDS . amLODipine  5 mg Oral Daily  . aspirin  81 mg Oral Daily  . atorvastatin  80 mg Oral Daily  . beta carotene w/minerals  1 tablet Oral Daily  . buPROPion  150 mg Oral Daily  . busPIRone  30 mg Oral Daily  . carvedilol  25 mg Oral BID  . escitalopram  20 mg Oral Daily  . irbesartan  150 mg Oral Daily  . multivitamin with minerals  1 tablet Oral Daily  . pantoprazole  40 mg Oral Daily    OBJECTIVE  Filed Vitals:   12/21/15 1800 12/21/15 2017 12/22/15 0341 12/22/15 0720  BP: 127/71 116/57 117/56 120/63  Pulse: 65 64 64 64  Temp:  99.3 F (37.4 C) 98.5 F (36.9 C) 97 F (36.1 C)  TempSrc:  Oral Oral Oral  Resp: 19 22 27 26   Height:      Weight:   206 lb 9.1 oz (93.7 kg)   SpO2: 95% 91% 91% 91%    Intake/Output Summary (Last 24 hours) at 12/22/15 1134 Last data filed at 12/22/15 0743  Gross per 24 hour  Intake    805 ml  Output    875 ml  Net    -70 ml   Filed Weights   12/21/15 1523 12/22/15 0341  Weight: 207 lb 3.7 oz (94 kg) 206 lb 9.1 oz (93.7 kg)    PHYSICAL EXAM  General: Pleasant, NAD. Neuro: Alert and oriented X 3. Moves all extremities spontaneously. Psych: Normal affect. HEENT:  Normal  Neck: Supple without bruits or JVD. Lungs:  Resp regular and unlabored, CTA. Heart: RRR no s3, s4, or murmurs. Abdomen: Soft, non-tender, non-distended, BS + x 4.  Extremities: No clubbing, cyanosis or edema. DP/PT/Radials 2+ and equal bilaterally.  Accessory Clinical Findings  CBC  Recent Labs  12/21/15 1352 12/22/15 0340  WBC 12.7* 14.3*  NEUTROABS 10.2*  --   HGB 15.0 13.3  HCT 44.6 40.6  MCV 88.3 88.5  PLT 183 [id number]   Basic Metabolic Panel  Recent Labs   12/21/15 1352 12/22/15 0340  NA 144 139  K 4.3 4.0  CL 106 104  CO2 24 23  GLUCOSE 156* 150*  BUN 12 17  CREATININE 1.11 1.31*  CALCIUM 9.4 8.9   Liver Function Tests  Recent Labs  12/21/15 1352 12/21/15 1620  AST 19 22  ALT 19 18  ALKPHOS 85 79  BILITOT 1.7* 1.9*  PROT 6.6 6.5  ALBUMIN 4.1 3.7   No results for input(s): LIPASE, AMYLASE in the last 72 hours. Cardiac Enzymes  Recent Labs  12/21/15 1620 12/21/15 2219 12/22/15 0340  TROPONINI <0.03 <0.03 <0.03    TELE  NSR   Radiology/Studies  Ct Angio Chest Aorta W/cm &/or Wo/cm  12/21/2015  CLINICAL DATA:  Sudden onset of chest pain, abdominal pain and back pain. Evaluate for dissection. EXAM: CT ANGIOGRAPHY CHEST, ABDOMEN AND PELVIS TECHNIQUE: Multidetector CT imaging through the chest, abdomen and pelvis was performed using the standard protocol during bolus administration of intravenous contrast. Multiplanar reconstructed images and MIPs were obtained and reviewed to evaluate the vascular anatomy. CONTRAST:  153mL OMNIPAQUE IOHEXOL 350 MG/ML SOLN COMPARISON:  None. FINDINGS: CTA CHEST FINDINGS Prior CABG. Heart is normal size. Aorta is normal caliber. No dissection. No mediastinal, hilar, or axillary adenopathy. Moderate-sized hiatal hernia. Chest wall soft tissues are unremarkable. Left pacer in place. Minimal linear atelectasis or scarring dependently in the right lower lobe. Lungs otherwise clear. No pleural effusions. Review of the MIP images confirms the above findings. CTA ABDOMEN AND PELVIS FINDINGS No evidence of aortic aneurysm or dissection. Scattered calcifications in the infrarenal aorta. Iliofemoral vessels are normal caliber. No dissection. Mild diffuse fatty infiltration of the liver. Questionable gallbladder wall thickening with slight stranding inferior to the gallbladder. Cannot completely exclude cholecystitis. No biliary duct dilatation. Spleen, pancreas, adrenals and kidneys are unremarkable.  Descending colonic and sigmoid diverticulosis. No active diverticulitis. Stomach, small bowel and remainder of large bowel unremarkable. No free fluid, free air or adenopathy. Urinary bladder appears mildly thick walled but is decompressed. This could be related to a component of bladder outlet obstruction related to the prostate. Small bilateral inguinal hernias containing fat. No acute bony abnormality or focal bone lesion. Review of the MIP images confirms the above findings. IMPRESSION: No evidence of aortic aneurysm or dissection. Mild infrarenal atherosclerotic change in the aorta. Fatty liver. Possible gallbladder wall thickening and small amount of stranding inferior to the gallbladder. Cannot exclude acute cholecystitis. If there is clinical concern, recommend right upper quadrant ultrasound. Moderate-sized hiatal hernia. Coronary artery disease, prior CABG. Left colonic diverticulosis. Critical Value/emergent results were called by telephone at the time of interpretation on 12/21/2015 at 2:33 pm to Dr. Pattricia Boss , who verbally acknowledged these results. Electronically Signed   By: Rolm Baptise M.D.   On: 12/21/2015 14:33   Ct Cta Abd/pel W/cm &/or W/o Cm  12/21/2015  CLINICAL DATA:  Sudden onset of chest pain, abdominal pain and back pain. Evaluate for dissection. EXAM: CT ANGIOGRAPHY CHEST, ABDOMEN AND PELVIS TECHNIQUE: Multidetector CT imaging through the chest, abdomen and pelvis was performed using the standard protocol during bolus administration of intravenous contrast. Multiplanar reconstructed images and MIPs were obtained and reviewed to evaluate the vascular anatomy. CONTRAST:  127mL OMNIPAQUE IOHEXOL 350 MG/ML SOLN COMPARISON:  None. FINDINGS: CTA CHEST FINDINGS Prior CABG. Heart is normal size. Aorta is normal caliber. No dissection. No mediastinal, hilar, or axillary adenopathy. Moderate-sized hiatal hernia. Chest wall soft tissues are unremarkable. Left pacer in place. Minimal linear  atelectasis or scarring dependently in the right lower lobe. Lungs otherwise clear. No pleural effusions. Review of the MIP images confirms the above findings. CTA ABDOMEN AND PELVIS FINDINGS No evidence of aortic aneurysm or dissection. Scattered calcifications in the infrarenal aorta. Iliofemoral vessels are normal caliber. No dissection. Mild diffuse fatty infiltration of the liver. Questionable gallbladder wall thickening with slight stranding inferior to the gallbladder. Cannot completely exclude cholecystitis. No biliary duct dilatation. Spleen, pancreas, adrenals and kidneys are unremarkable. Descending colonic and sigmoid diverticulosis. No active diverticulitis. Stomach, small bowel and remainder of large bowel unremarkable. No free fluid, free air or adenopathy. Urinary bladder appears mildly thick walled but is decompressed. This could be related to a component of bladder outlet obstruction related to the prostate. Small bilateral inguinal hernias containing fat. No acute bony abnormality or focal bone lesion. Review of the MIP images confirms the above findings. IMPRESSION: No evidence of aortic aneurysm or dissection. Mild infrarenal atherosclerotic change in the aorta. Fatty liver. Possible gallbladder wall thickening and small amount of stranding inferior to the gallbladder. Cannot exclude acute cholecystitis. If there is clinical concern,  recommend right upper quadrant ultrasound. Moderate-sized hiatal hernia. Coronary artery disease, prior CABG. Left colonic diverticulosis. Critical Value/emergent results were called by telephone at the time of interpretation on 12/21/2015 at 2:33 pm to Dr. Pattricia Boss , who verbally acknowledged these results. Electronically Signed   By: Rolm Baptise M.D.   On: 12/21/2015 14:33    ASSESSMENT AND PLAN 70 y/o male with known CAD s/p CABG in 1999, ICM, HTN and HLD presenting as a CODE STEMI with CP and ST elevations in V1-V3  Epigastric pain/STEMI: CT negative  for dissection. EKG with ST elevations in leads V1-V3. Patient taken to cath lab for emergent LHC. Cath showed patient grafts no acute lesions to explain abnormal EKG. Suspect GI etiology. Monitored overnight with no recurrent pain. Had nausea and vomiting with pain yesterday. Will check LFTs, amalyse, lipase and RUQ [place] to r/o out cholecystitis.   CAD: s/p CABG in 1999 with a LIMA-LAD and SVGs to the OM, CX and RCA. LHC in 2008 showed patent grafts. EKG was concerning for STEMI, however emergent cath yesterday showed patent grafts and no new lesions. Continue medical therapy for CAD.   ICM: EF of 20-25% on recent echo 07/2015. Has an ICD, followed by Dr. Lovena Le.   Post Cath: patient will need post cath hydration and close monitoring of renal function given contrast used for chest CT and [facility]. Admission Scr was 1.11. Creat 1.31 today. Continue to monitor  Signed, Eileen Stanford PA-C  Pager [phone]  As above, patient seen and examined. Patient has had no further epigastric pain. He denies dyspnea. Catheterization showed patent grafts. The etiology of presentation unclear. There is mild tenderness in the right upper quadrant on palpation. Schedule abd ultrasound to rule out cholecystitis. Recheck liver functions, amylase and lipase. Significant amount of contrast yesterday. Recheck renal function tomorrow morning. Plan discharge tomorrow if ultrasound negative and renal function stable. Kirk Ruths

## 2015-12-22 NOTE — Progress Notes (Signed)
UR COMPLETED  

## 2015-12-23 ENCOUNTER — Telehealth: Payer: Self-pay | Admitting: Cardiovascular Disease

## 2015-12-23 LAB — BASIC METABOLIC PANEL
Anion gap: 12 (ref 5–15)
BUN: 18 mg/dL (ref 6–20)
CO2: 25 mmol/L (ref 22–32)
Calcium: 7.9 mg/dL — ABNORMAL LOW (ref 8.9–10.3)
Chloride: 102 mmol/L (ref 101–111)
Creatinine, Ser: 1.34 mg/dL — ABNORMAL HIGH (ref 0.61–1.24)
GFR, EST NON AFRICAN AMERICAN: 53 mL/min — AB (ref >60)
Glucose, Bld: 118 mg/dL — ABNORMAL HIGH (ref 65–99)
POTASSIUM: 3.7 mmol/L (ref 3.5–5.1)
SODIUM: 139 mmol/L (ref 135–145)

## 2015-12-23 MED ORDER — NITROGLYCERIN 0.4 MG SL SUBL: 0.4000 mg | SUBLINGUAL_TABLET | SUBLINGUAL | Status: AC | PRN

## 2015-12-23 NOTE — Telephone Encounter (Signed)
New message     TCM appt on 2.1.2017 with Dr. Acie Fredrickson per Vin PA.

## 2015-12-23 NOTE — Discharge Summary (Signed)
Discharge Summary    Patient ID: [name],  MRN: [id number], DOB/AGE: 1946/04/07 70 y.o.  Admit date: 12/21/2015 Discharge date: 12/23/2015  Primary Care Provider: Hoyt Koch Primary Cardiologist: Dr. Ron Parker (now Dr. Acie Fredrickson)  Followed by Dr. Lovena Le for ICD  Discharge Diagnoses      ST elevation myocardial infarction (STEMI) [facility])   Epigastric pain   Atypical chest pain   CAD (coronary artery disease) s/p CABG   ICM   HTN   HLD   Allergies Allergies  Allergen Reactions  . Other Anaphylaxis    Insect bites (Has EpiPen)    Diagnostic Studies/Procedures     Left Heart Cath and Coronary Angiography    Conclusion     Ost LAD to Prox LAD lesion, 100% stenosed.  Prox Cx to Mid Cx lesion, 100% stenosed.  Ost RCA to Prox RCA lesion, 100% stenosed.  IMPRESSION:[name] had widely patent grafts. There were no "culprit lesions. I do not think his symptoms were Cardiovascular in nature. I MYNX sealed his right common femoral artery puncture site. We will continue to observe him in a telemetry bed.       History of Present Illness     70 y/o male with known CAD s/p CABG in 1999, ICM, HTN and HLD presented 12/21/15 as a CODE STEMI with CP and ST elevations in V1-V3.  He has known coronary disease, s/p CABG in 1999 with a LIMA-LAD and SVGs to the OM, CX and RCA. LHC in 2008 showed patent grafts. He has h/o ICM with EF of 20-25% s/p ICD, followed by Dr. Lovena Le. He also has a h/o HTN and HLD.  He presented to the ED, with a complaint of sudden onset severe mid epigastric pain radiating to his back. Symptoms started at approximately 11 AM, 2 hrs prior to presentation. On arrival to the ED, he was pale and diaphoretic. EKG showed new ST elevations in leads V1-V3. Prior to going to cath lab a STAT CT of the chest was performed in the ED and was negative for dissection. 100 cc of contrast was used for CT scan. He was then taken urgently to the cath lab for  emergent cardiac catheterization, per Dr. Gwenlyn Found.   Hospital Course     Consultants: None  Cath showed patient grafts no acute lesions to explain abnormal EKG. Continued medical therapy for CAD. Suspected GI etiology as patient had a nausea and vomiting post cath. Lipase, amylase and LFT were normal. CT of abdomen and pelvis showed moderate sized hiatal hernia, fatty liver and left colonic diverticulosis. Abdominal US showed Gallbladder wall thickening without cholelithiasis or other signs of acute cholecystitis. This may be related to hepatic dysfunction, but if there is strong clinical suspicion for acute cholecystitis, consider nuclear medicine study. Heterogeneous hepatic echotexture without focal hepatic abnormality. This can be seen with acute or chronic hepatocellular disease. His abdominal pain was resolved during admission.   Admission Scr was 1.11. The patient was hydrated post cath given contrast used for chest CT and [facility].  Post cath creatinine was minimally elevated. Will need BMET check during TCM appointment.   The patient has been seen by Dr. Stanford Breed today and deemed ready for discharge home. All follow-up appointments have been scheduled. Discharge medications are listed below.    He was taking Nexium and Protonix for GERD. DC Protonix for now. Discuss with PCP for further management. He was also advised to f/u with PCP for possible HIDA scan as  outpatient. Resolved GI symptoms at discharge. If recurrent symptoms would likely need GI evaluation. Blood sugar ~150s. A1c of 5.9 01/13/2015. F/u with PCP for recheck.    Discharge Vitals Blood pressure 125/62, pulse 65, temperature 99.5 F (37.5 C), temperature source Oral, resp. rate 23, height 6' (1.829 m), weight 207 lb 3.7 oz (94 kg), SpO2 92 %.  Filed Weights   12/21/15 1523 12/22/15 0341 12/23/15 0406  Weight: 207 lb 3.7 oz (94 kg) 206 lb 9.1 oz (93.7 kg) 207 lb 3.7 oz (94 kg)    Labs & Radiologic Studies     CBC  Recent  Labs  12/21/15 1352 12/22/15 0340  WBC 12.7* 14.3*  NEUTROABS 10.2*  --   HGB 15.0 13.3  HCT 44.6 40.6  MCV 88.3 88.5  PLT 183 [id number]   Basic Metabolic Panel  Recent Labs  12/22/15 0340 12/23/15 0330  NA 139 139  K 4.0 3.7  CL 104 102  CO2 23 25  GLUCOSE 150* 118*  BUN 17 18  CREATININE 1.31* 1.34*  CALCIUM 8.9 7.9*   Liver Function Tests  Recent Labs  12/21/15 1620 12/22/15 0340  AST 22 37  ALT 18 26  ALKPHOS 79 74  BILITOT 1.9* 1.8*  PROT 6.5 6.0*  ALBUMIN 3.7 3.3*    Recent Labs  12/22/15 0340  LIPASE 17  AMYLASE 43   Cardiac Enzymes  Recent Labs  12/21/15 1620 12/21/15 2219 12/22/15 0340  TROPONINI <0.03 <0.03 <0.03    Ct Angio Chest Aorta W/cm &/or Wo/cm  12/21/2015  CLINICAL DATA:  Sudden onset of chest pain, abdominal pain and back pain. Evaluate for dissection. EXAM: CT ANGIOGRAPHY CHEST, ABDOMEN AND PELVIS TECHNIQUE: Multidetector CT imaging through the chest, abdomen and pelvis was performed using the standard protocol during bolus administration of intravenous contrast. Multiplanar reconstructed images and MIPs were obtained and reviewed to evaluate the vascular anatomy. CONTRAST:  191mL OMNIPAQUE IOHEXOL 350 MG/ML SOLN COMPARISON:  None. FINDINGS: CTA CHEST FINDINGS Prior CABG. Heart is normal size. Aorta is normal caliber. No dissection. No mediastinal, hilar, or axillary adenopathy. Moderate-sized hiatal hernia. Chest wall soft tissues are unremarkable. Left pacer in place. Minimal linear atelectasis or scarring dependently in the right lower lobe. Lungs otherwise clear. No pleural effusions. Review of the MIP images confirms the above findings. CTA ABDOMEN AND PELVIS FINDINGS No evidence of aortic aneurysm or dissection. Scattered calcifications in the infrarenal aorta. Iliofemoral vessels are normal caliber. No dissection. Mild diffuse fatty infiltration of the liver. Questionable gallbladder wall thickening with slight stranding inferior to the  gallbladder. Cannot completely exclude cholecystitis. No biliary duct dilatation. Spleen, pancreas, adrenals and kidneys are unremarkable. Descending colonic and sigmoid diverticulosis. No active diverticulitis. Stomach, small bowel and remainder of large bowel unremarkable. No free fluid, free air or adenopathy. Urinary bladder appears mildly thick walled but is decompressed. This could be related to a component of bladder outlet obstruction related to the prostate. Small bilateral inguinal hernias containing fat. No acute bony abnormality or focal bone lesion. Review of the MIP images confirms the above findings. IMPRESSION: No evidence of aortic aneurysm or dissection. Mild infrarenal atherosclerotic change in the aorta. Fatty liver. Possible gallbladder wall thickening and small amount of stranding inferior to the gallbladder. Cannot exclude acute cholecystitis. If there is clinical concern, recommend right upper quadrant ultrasound. Moderate-sized hiatal hernia. Coronary artery disease, prior CABG. Left colonic diverticulosis. Critical Value/emergent results were called by telephone at the time of interpretation on 12/21/2015 at 2:33 pm  to Dr. Pattricia Boss , who verbally acknowledged these results. Electronically Signed   By: Rolm Baptise M.D.   On: 12/21/2015 14:33   Ct Cta Abd/pel W/cm &/or W/o Cm  12/21/2015  CLINICAL DATA:  Sudden onset of chest pain, abdominal pain and back pain. Evaluate for dissection. EXAM: CT ANGIOGRAPHY CHEST, ABDOMEN AND PELVIS TECHNIQUE: Multidetector CT imaging through the chest, abdomen and pelvis was performed using the standard protocol during bolus administration of intravenous contrast. Multiplanar reconstructed images and MIPs were obtained and reviewed to evaluate the vascular anatomy. CONTRAST:  118mL OMNIPAQUE IOHEXOL 350 MG/ML SOLN COMPARISON:  None. FINDINGS: CTA CHEST FINDINGS Prior CABG. Heart is normal size. Aorta is normal caliber. No dissection. No mediastinal,  hilar, or axillary adenopathy. Moderate-sized hiatal hernia. Chest wall soft tissues are unremarkable. Left pacer in place. Minimal linear atelectasis or scarring dependently in the right lower lobe. Lungs otherwise clear. No pleural effusions. Review of the MIP images confirms the above findings. CTA ABDOMEN AND PELVIS FINDINGS No evidence of aortic aneurysm or dissection. Scattered calcifications in the infrarenal aorta. Iliofemoral vessels are normal caliber. No dissection. Mild diffuse fatty infiltration of the liver. Questionable gallbladder wall thickening with slight stranding inferior to the gallbladder. Cannot completely exclude cholecystitis. No biliary duct dilatation. Spleen, pancreas, adrenals and kidneys are unremarkable. Descending colonic and sigmoid diverticulosis. No active diverticulitis. Stomach, small bowel and remainder of large bowel unremarkable. No free fluid, free air or adenopathy. Urinary bladder appears mildly thick walled but is decompressed. This could be related to a component of bladder outlet obstruction related to the prostate. Small bilateral inguinal hernias containing fat. No acute bony abnormality or focal bone lesion. Review of the MIP images confirms the above findings. IMPRESSION: No evidence of aortic aneurysm or dissection. Mild infrarenal atherosclerotic change in the aorta. Fatty liver. Possible gallbladder wall thickening and small amount of stranding inferior to the gallbladder. Cannot exclude acute cholecystitis. If there is clinical concern, recommend right upper quadrant ultrasound. Moderate-sized hiatal hernia. Coronary artery disease, prior CABG. Left colonic diverticulosis. Critical Value/emergent results were called by telephone at the time of interpretation on 12/21/2015 at 2:33 pm to Dr. Pattricia Boss , who verbally acknowledged these results. Electronically Signed   By: Rolm Baptise M.D.   On: 12/21/2015 14:33   US Abdomen Limited Ruq  12/22/2015  CLINICAL  DATA:  70 year old male with epigastric and right upper quadrant abdominal pain. EXAM: US ABDOMEN LIMITED - RIGHT UPPER QUADRANT COMPARISON:  12/21/2015 CT. FINDINGS: Gallbladder: Gallbladder wall thickening is identified measuring up to 7 mm. There is no evidence of cholelithiasis, pericholecystic fluid or sonographic Murphy sign. Common bile duct: Diameter: 3 mm. There is no evidence of intrahepatic or extrahepatic biliary dilatation. Liver: Heterogeneous hepatic echotexture identified without focal abnormality. There is no evidence of ascites within the right upper abdomen. IMPRESSION: Gallbladder wall thickening without cholelithiasis or other signs of acute cholecystitis. This may be related to hepatic dysfunction, but if there is strong clinical suspicion for acute cholecystitis, consider nuclear medicine study. Heterogeneous hepatic echotexture without focal hepatic abnormality. This can be seen with acute or chronic hepatocellular disease. Electronically Signed   By: Margarette Canada M.D.   On: 12/22/2015 20:54    Disposition   Pt is being discharged home today in good condition.  Follow-up Plans & Appointments    Follow-up Information    Follow up with Nahser, Wonda Cheng, MD. Go on 12/30/2015.   Specialty:  Cardiology   Why:  @10 :30  Contact information:   [address] [address] [phone]       Follow up with Hoyt Koch, MD. Schedule an appointment as soon as possible for a visit in 1 week.   Specialty:  Internal Medicine   Why:  for hyperglycemia and epigastric pain   Contact information:   [place] [address] [phone]      Discharge Instructions    Diet - low sodium heart healthy    Complete by:  As directed      Discharge instructions    Complete by:  As directed   NO HEAVY LIFTING (>10lbs) X 2 WEEKS. NO SEXUAL ACTIVITY X 2 WEEKS. NO DRIVING X5 days.  NO SOAKING BATHS, HOT TUBS, POOLS, ETC., X 7 DAYS.  Week.  He was taking Nexium and Protonix for GERD. DC Protonix for now. Discuss with PCP for further management.     Increase activity slowly    Complete by:  As directed            Discharge Medications   Discharge Medication List as of 12/23/2015 10:14 AM    START taking these medications   Details  nitroGLYCERIN (NITROSTAT) 0.4 MG SL tablet Place 1 tablet (0.4 mg total) under the tongue every 5 (five) minutes as needed for chest pain., Starting 12/23/2015, Until Discontinued, Normal      CONTINUE these medications which have NOT CHANGED   Details  amLODipine (NORVASC) 5 MG tablet Take 1 tablet (5 mg total) by mouth daily., Starting 01/05/2015, Until Discontinued, Normal    atorvastatin (LIPITOR) 80 MG tablet TAKE 1 TABLET BY MOUTH EVERY DAY, Normal    carvedilol (COREG) 25 MG tablet TAKE 1 TABLET BY MOUTH TWICE A DAY, Normal    EPINEPHrine (EPIPEN) 0.3 mg/0.3 mL DEVI Inject 0.3 mLs (0.3 mg total) into the muscle as needed., Starting 06/09/2012, Until Discontinued, Print    aspirin 81 MG EC tablet Take 81 mg by mouth daily.  , Until Discontinued, Historical Med    busPIRone (BUSPAR) 30 MG tablet Take 30 mg by mouth daily., Until Discontinued, Historical Med    escitalopram (LEXAPRO) 20 MG tablet Take 20 mg by mouth daily.  , Until Discontinued, Historical Med    esomeprazole (NEXIUM) 20 MG capsule Take 20 mg by mouth daily at 12 noon., Until Discontinued, Historical Med    !! Multiple Vitamins-Minerals (MULTIVITAMIN,TX-MINERALS) tablet Take 1 tablet by mouth daily.  , Until Discontinued, Historical Med    !! Multiple Vitamins-Minerals (OCUVITE PRESERVISION) TABS Take 1 tablet by mouth daily. , Until Discontinued, Historical Med    NON FORMULARY Joint Juice: 8 oz daily , Until Discontinued, Historical Med    valsartan (DIOVAN) 160 MG tablet TAKE 1 TABLET BY MOUTH TWICE A DAY, Normal    zolpidem (AMBIEN) 10 MG tablet Take 1 tablet by mouth at bedtime as needed for sleep. ,  Starting 02/28/2014, Until Discontinued, Historical Med     !! - Potential duplicate medications found. Please discuss with provider.    STOP taking these medications     buPROPion (WELLBUTRIN XL) 150 MG 24 hr tablet      pantoprazole (PROTONIX) 40 MG tablet          Aspirin prescribed at discharge?  Yes High Intensity Statin Prescribed? (Lipitor 40-80mg  or Crestor 20-40mg ): Yes Beta Blocker Prescribed? Yes For EF 45% or less, Was ACEI/ARB Prescribed? Yes ADP Receptor Inhibitor Prescribed? (i.e. Plavix etc.-Includes Medically Managed Patients): NA  For EF <40%, Aldosterone Inhibitor Prescribed? {NA Was EF assessed during THIS hospitalization?No Was Cardiac Rehab II ordered? (Included Medically managed Patients): No   Outstanding Labs/Studies   BEMT during TCM  Duration of Discharge Encounter   Greater than 30 minutes including physician time.  Signed, Allia Wiltsey PA-C 12/23/2015, 5:05 PM

## 2015-12-23 NOTE — Progress Notes (Signed)
Patient Name: [name] Date of Encounter: 12/23/2015   SUBJECTIVE  Feeling well. No chest pain, sob or palpitations.  Resolved abdominal pain. Amublating well with discomfort.   CURRENT MEDS . amLODipine  5 mg Oral Daily  . aspirin  81 mg Oral Daily  . atorvastatin  80 mg Oral Daily  . beta carotene w/minerals  1 tablet Oral Daily  . buPROPion  150 mg Oral Daily  . busPIRone  30 mg Oral Daily  . carvedilol  25 mg Oral BID  . enoxaparin (LOVENOX) injection  40 mg Subcutaneous Daily  . escitalopram  20 mg Oral Daily  . irbesartan  150 mg Oral Daily  . multivitamin with minerals  1 tablet Oral Daily  . pantoprazole  40 mg Oral Daily    OBJECTIVE  Filed Vitals:   12/22/15 2000 12/22/15 2225 12/23/15 0406 12/23/15 0745  BP:  113/64 127/70 125/62  Pulse:   62 65  Temp: 99.7 F (37.6 C)  99.6 F (37.6 C) 99.5 F (37.5 C)  TempSrc: Oral  Oral Oral  Resp:  21 26 23   Height:      Weight:   207 lb 3.7 oz (94 kg)   SpO2:   92% 92%    Intake/Output Summary (Last 24 hours) at 12/23/15 0853 Last data filed at 12/23/15 0544  Gross per 24 hour  Intake    240 ml  Output   1000 ml  Net   -760 ml   Filed Weights   12/21/15 1523 12/22/15 0341 12/23/15 0406  Weight: 207 lb 3.7 oz (94 kg) 206 lb 9.1 oz (93.7 kg) 207 lb 3.7 oz (94 kg)    PHYSICAL EXAM  General: Pleasant, NAD. Neuro: Alert and oriented X 3. Moves all extremities spontaneously. Psych: Normal affect. HEENT:  Normal  Neck: Supple without bruits or JVD. Lungs:  Resp regular and unlabored, CTA. Heart: RRR no s3, s4, or murmurs. Abdomen: Soft, non-tender, non-distended, BS + x 4.  Extremities: No clubbing, cyanosis or edema. DP/PT/Radials 2+ and equal bilaterally.  Accessory Clinical Findings  CBC  Recent Labs  12/21/15 1352 12/22/15 0340  WBC 12.7* 14.3*  NEUTROABS 10.2*  --   HGB 15.0 13.3  HCT 44.6 40.6  MCV 88.3 88.5  PLT 183 [id number]   Basic Metabolic Panel  Recent Labs  12/22/15 0340  12/23/15 0330  NA 139 139  K 4.0 3.7  CL 104 102  CO2 23 25  GLUCOSE 150* 118*  BUN 17 18  CREATININE 1.31* 1.34*  CALCIUM 8.9 7.9*   Liver Function Tests  Recent Labs  12/21/15 1620 12/22/15 0340  AST 22 37  ALT 18 26  ALKPHOS 79 74  BILITOT 1.9* 1.8*  PROT 6.5 6.0*  ALBUMIN 3.7 3.3*    Recent Labs  12/22/15 0340  LIPASE 17  AMYLASE 43   Cardiac Enzymes  Recent Labs  12/21/15 1620 12/21/15 2219 12/22/15 0340  TROPONINI <0.03 <0.03 <0.03    TELE  Sinus rhythm with PVCs  Radiology/Studies  Ct Angio Chest Aorta W/cm &/or Wo/cm  12/21/2015  CLINICAL DATA:  Sudden onset of chest pain, abdominal pain and back pain. Evaluate for dissection. EXAM: CT ANGIOGRAPHY CHEST, ABDOMEN AND PELVIS TECHNIQUE: Multidetector CT imaging through the chest, abdomen and pelvis was performed using the standard protocol during bolus administration of intravenous contrast. Multiplanar reconstructed images and MIPs were obtained and reviewed to evaluate the vascular anatomy. CONTRAST:  15mL OMNIPAQUE IOHEXOL 350 MG/ML SOLN COMPARISON:  None. FINDINGS: CTA CHEST FINDINGS Prior CABG. Heart is normal size. Aorta is normal caliber. No dissection. No mediastinal, hilar, or axillary adenopathy. Moderate-sized hiatal hernia. Chest wall soft tissues are unremarkable. Left pacer in place. Minimal linear atelectasis or scarring dependently in the right lower lobe. Lungs otherwise clear. No pleural effusions. Review of the MIP images confirms the above findings. CTA ABDOMEN AND PELVIS FINDINGS No evidence of aortic aneurysm or dissection. Scattered calcifications in the infrarenal aorta. Iliofemoral vessels are normal caliber. No dissection. Mild diffuse fatty infiltration of the liver. Questionable gallbladder wall thickening with slight stranding inferior to the gallbladder. Cannot completely exclude cholecystitis. No biliary duct dilatation. Spleen, pancreas, adrenals and kidneys are unremarkable.  Descending colonic and sigmoid diverticulosis. No active diverticulitis. Stomach, small bowel and remainder of large bowel unremarkable. No free fluid, free air or adenopathy. Urinary bladder appears mildly thick walled but is decompressed. This could be related to a component of bladder outlet obstruction related to the prostate. Small bilateral inguinal hernias containing fat. No acute bony abnormality or focal bone lesion. Review of the MIP images confirms the above findings. IMPRESSION: No evidence of aortic aneurysm or dissection. Mild infrarenal atherosclerotic change in the aorta. Fatty liver. Possible gallbladder wall thickening and small amount of stranding inferior to the gallbladder. Cannot exclude acute cholecystitis. If there is clinical concern, recommend right upper quadrant ultrasound. Moderate-sized hiatal hernia. Coronary artery disease, prior CABG. Left colonic diverticulosis. Critical Value/emergent results were called by telephone at the time of interpretation on 12/21/2015 at 2:33 pm to Dr. Pattricia Boss , who verbally acknowledged these results. Electronically Signed   By: Rolm Baptise M.D.   On: 12/21/2015 14:33   Ct Cta Abd/pel W/cm &/or W/o Cm  12/21/2015  CLINICAL DATA:  Sudden onset of chest pain, abdominal pain and back pain. Evaluate for dissection. EXAM: CT ANGIOGRAPHY CHEST, ABDOMEN AND PELVIS TECHNIQUE: Multidetector CT imaging through the chest, abdomen and pelvis was performed using the standard protocol during bolus administration of intravenous contrast. Multiplanar reconstructed images and MIPs were obtained and reviewed to evaluate the vascular anatomy. CONTRAST:  146mL OMNIPAQUE IOHEXOL 350 MG/ML SOLN COMPARISON:  None. FINDINGS: CTA CHEST FINDINGS Prior CABG. Heart is normal size. Aorta is normal caliber. No dissection. No mediastinal, hilar, or axillary adenopathy. Moderate-sized hiatal hernia. Chest wall soft tissues are unremarkable. Left pacer in place. Minimal linear  atelectasis or scarring dependently in the right lower lobe. Lungs otherwise clear. No pleural effusions. Review of the MIP images confirms the above findings. CTA ABDOMEN AND PELVIS FINDINGS No evidence of aortic aneurysm or dissection. Scattered calcifications in the infrarenal aorta. Iliofemoral vessels are normal caliber. No dissection. Mild diffuse fatty infiltration of the liver. Questionable gallbladder wall thickening with slight stranding inferior to the gallbladder. Cannot completely exclude cholecystitis. No biliary duct dilatation. Spleen, pancreas, adrenals and kidneys are unremarkable. Descending colonic and sigmoid diverticulosis. No active diverticulitis. Stomach, small bowel and remainder of large bowel unremarkable. No free fluid, free air or adenopathy. Urinary bladder appears mildly thick walled but is decompressed. This could be related to a component of bladder outlet obstruction related to the prostate. Small bilateral inguinal hernias containing fat. No acute bony abnormality or focal bone lesion. Review of the MIP images confirms the above findings. IMPRESSION: No evidence of aortic aneurysm or dissection. Mild infrarenal atherosclerotic change in the aorta. Fatty liver. Possible gallbladder wall thickening and small amount of stranding inferior to the gallbladder. Cannot exclude acute cholecystitis. If there is clinical concern,  recommend right upper quadrant ultrasound. Moderate-sized hiatal hernia. Coronary artery disease, prior CABG. Left colonic diverticulosis. Critical Value/emergent results were called by telephone at the time of interpretation on 12/21/2015 at 2:33 pm to Dr. Pattricia Boss , who verbally acknowledged these results. Electronically Signed   By: Rolm Baptise M.D.   On: 12/21/2015 14:33   US Abdomen Limited Ruq  12/22/2015  CLINICAL DATA:  70 year old male with epigastric and right upper quadrant abdominal pain. EXAM: US ABDOMEN LIMITED - RIGHT UPPER QUADRANT COMPARISON:   12/21/2015 CT. FINDINGS: Gallbladder: Gallbladder wall thickening is identified measuring up to 7 mm. There is no evidence of cholelithiasis, pericholecystic fluid or sonographic Murphy sign. Common bile duct: Diameter: 3 mm. There is no evidence of intrahepatic or extrahepatic biliary dilatation. Liver: Heterogeneous hepatic echotexture identified without focal abnormality. There is no evidence of ascites within the right upper abdomen. IMPRESSION: Gallbladder wall thickening without cholelithiasis or other signs of acute cholecystitis. This may be related to hepatic dysfunction, but if there is strong clinical suspicion for acute cholecystitis, consider nuclear medicine study. Heterogeneous hepatic echotexture without focal hepatic abnormality. This can be seen with acute or chronic hepatocellular disease. Electronically Signed   By: Margarette Canada M.D.   On: 12/22/2015 20:54    ASSESSMENT AND PLAN  70 y/o male with known CAD s/p CABG in 1999, ICM, HTN and HLD presenting as a CODE STEMI with CP and ST elevations in V1-V3  1. Epigastric pain/STEMI: CT negative for dissection. EKG with ST elevations in leads V1-V3. Patient taken to cath lab for emergent LHC. Cath showed patient grafts no acute lesions to explain abnormal EKG. Trop x 3 negative.  - Lipase, amylase and LFT were normal.  - Abdominal US showed Gallbladder wall thickening without cholelithiasis or other signs of acute cholecystitis. This may be related to hepatic dysfunction, but if there is strong clinical suspicion for acute cholecystitis, consider nuclear medicine study. Heterogeneous hepatic echotexture without focal hepatic abnormality. This can be seen with acute or chronic hepatocellular disease. - CT of abdomen and pelvis showed moderate sized hiatal hernia, fatty liver and left colonic diverticulosis. - Will discuss plan with MD. Pain has been resolved. Likely f/u with PCP for possible HIDA scan.   2. CAD: s/p CABG in 1999 with a  LIMA-LAD and SVGs to the OM, CX and RCA. LHC in 2008 showed patent grafts. EKG was concerning for STEMI, however emergent cath yesterday showed patent grafts and no new lesions. Continue medical therapy for CAD.   3. ICM: EF of 20-25% on recent echo 07/2015. Has an ICD, followed by Dr. Lovena Le.   4. Post Cath: large amount of contrast used for CT and left heart cath. Admission Scr was 1.11. Creat 1.34 today, stable.  Recheck in 1 week during TCM.   5. Hyperglycemia - Blood sugar ~150s. A1c of 5.9 01/13/2015. F/u with PCP for recheck.   6. HTN - Stable and well controlled. Continue current regimen.   Signed, Bhagat,Bhavinkumar PA-C Pager [phone]  As above, patient seen and examined; no chest or abdominal pain and no dyspnea; no RUQ tenderness on exam. Cr stable. Plan DC today on present meds and FU with Dr Acie Fredrickson. RUQ ultrasound showed gall bladder wall thickening but no stones or other signs of cholecystitis; LFTS and amylase/lipase unremarkable. If recurrent symptoms would likely need GI evaluation. FU primary care; may need HIDA scan as outpt.  > 30 min PA and physician time D2 Kirk Ruths

## 2015-12-24 ENCOUNTER — Telehealth: Payer: Self-pay | Admitting: *Deleted

## 2015-12-24 NOTE — Telephone Encounter (Signed)
Left message to call back  

## 2015-12-24 NOTE — Telephone Encounter (Signed)
Transition Care Management Follow-up Telephone Call   Date discharged? 12/23/15   How have you been since you were released from the hospital? Call pt he states he is doing ok   Do you understand why you were in the hospital? YES   Do you understand the discharge instructions? YES   Where were you discharged to? Home   Items Reviewed:  Medications reviewed: YES  Allergies reviewed: YES  Dietary changes reviewed: YES  Referrals reviewed No referral needed   Functional Questionnaire:  Activities of Daily Living (ADLs):   He states he are independent in the following: ambulation, bathing and hygiene, feeding, continence, grooming, toileting and dressing States he  Doesn't require assistance    Any transportation issues/concerns?: NO   Any patient concerns? NO   Confirmed importance and date/time of follow-up visits scheduled YES, appt 01/05/16  Provider Appointment booked with Dr. Sharlet Salina  Confirmed with patient if condition begins to worsen call PCP or go to the ER.  Patient was given the office number and encouraged to call back with question or concerns.  : YES

## 2015-12-25 NOTE — Telephone Encounter (Signed)
Left message on machine for pt to contact the office.   

## 2015-12-28 NOTE — Telephone Encounter (Signed)
Pt called back please give him a call.

## 2015-12-28 NOTE — Telephone Encounter (Signed)
Patient contacted regarding discharge from [facility] on Jan 25,2017.  Patient understands to follow up with provider Dr Acie Fredrickson on 12/30/15 at [facility]. Patient understands discharge instructions? yes Patient understands medications and regiment? yes Patient understands to bring all medications to this visit? yes

## 2015-12-28 NOTE — Telephone Encounter (Signed)
LMTCB

## 2015-12-30 ENCOUNTER — Ambulatory Visit (INDEPENDENT_AMBULATORY_CARE_PROVIDER_SITE_OTHER): Payer: Medicare Other | Admitting: Cardiovascular Disease

## 2015-12-30 ENCOUNTER — Encounter: Payer: Self-pay | Admitting: Cardiovascular Disease

## 2015-12-30 VITALS — BP 102/78 | HR 48 | Ht 72.0 in | Wt 200.0 lb

## 2015-12-30 DIAGNOSIS — I5022 Chronic systolic (congestive) heart failure: Secondary | ICD-10-CM

## 2015-12-30 DIAGNOSIS — I5042 Chronic combined systolic (congestive) and diastolic (congestive) heart failure: Secondary | ICD-10-CM | POA: Insufficient documentation

## 2015-12-30 DIAGNOSIS — E785 Hyperlipidemia, unspecified: Secondary | ICD-10-CM | POA: Diagnosis not present

## 2015-12-30 DIAGNOSIS — I5043 Acute on chronic combined systolic (congestive) and diastolic (congestive) heart failure: Secondary | ICD-10-CM | POA: Insufficient documentation

## 2015-12-30 DIAGNOSIS — I2510 Atherosclerotic heart disease of native coronary artery without angina pectoris: Secondary | ICD-10-CM

## 2015-12-30 LAB — LIPID PANEL
Cholesterol: 134 mg/dL (ref 125–200)
HDL: 37 mg/dL — AB (ref >=40)
LDL CALC: 79 mg/dL (ref <130)
Total CHOL/HDL Ratio: 3.6 Ratio (ref <=5.0)
Triglycerides: 89 mg/dL (ref <150)
VLDL: 18 mg/dL (ref <30)

## 2015-12-30 LAB — COMPREHENSIVE METABOLIC PANEL
ALBUMIN: 3.9 g/dL (ref 3.6–5.1)
ALT: 51 U/L — AB (ref 9–46)
AST: 44 U/L — AB (ref 10–35)
Alkaline Phosphatase: 111 U/L (ref 40–115)
BILIRUBIN TOTAL: 1 mg/dL (ref 0.2–1.2)
BUN: 16 mg/dL (ref 7–25)
CHLORIDE: 106 mmol/L (ref 98–110)
CO2: 22 mmol/L (ref 20–31)
CREATININE: 1.32 mg/dL — AB (ref 0.70–1.25)
Calcium: 9.1 mg/dL (ref 8.6–10.3)
Glucose, Bld: 101 mg/dL — ABNORMAL HIGH (ref 65–99)
Potassium: 3.6 mmol/L (ref 3.5–5.3)
SODIUM: 140 mmol/L (ref 135–146)
TOTAL PROTEIN: 6.9 g/dL (ref 6.1–8.1)

## 2015-12-30 NOTE — Patient Instructions (Signed)
Medication Instructions:  Your physician recommends that you continue on your current medications as directed. Please refer to the Current Medication list given to you today.   Labwork: TODAY - cholesterol, liver, basic metabolic panel   Testing/Procedures: None Ordered   Follow-Up: Your physician wants you to follow-up in: 6 months with Dr. Nahser.  You will receive a reminder letter in the mail two months in advance. If you don't receive a letter, please call our office to schedule the follow-up appointment.   If you need a refill on your cardiac medications before your next appointment, please call your pharmacy.   Thank you for choosing [facility]! Averi Kilty, RN [phone]   

## 2015-12-30 NOTE — Progress Notes (Signed)
Cardiology Office Note   Date:  12/30/2015   ID:  [name], DOB Aug 23, 1946, MRN [id number]  PCP:  Hoyt Koch, MD  Cardiologist:  Acie Fredrickson Wonda Cheng, MD   Chief Complaint  Patient presents with  . Coronary Artery Disease   Problem List 1. CAD 2. Chronic systolic CHF 3. Essential HTN 4. ICD  5. Hyperlipidemia    History of Present Illness: [name] is a 70 y.o. male who presents today to follow-up coronary disease and ischemic cardiomyopathy and hypertension. He recently had an ICD generator change. He has seen Dr. Lovena Le in follow-up. He has significant left ventricular dysfunction. He has not had an echo for many many years. He has been clinically stable. He is on the medications for CHF that he has been able to tolerate in the past. Because of complicated medicines for his adult ADHD, I have chosen not to push his medicines any further over time. Of course this can always be reconsidered going forward.  He's done very well over the years and he continues to do well. He is not having any chest pain or shortness of breath. When his ICD has been interrogated, he has not had any recurring ventricular tachycardia.    Past Medical History  Diagnosis Date  . Adult ADHD   . 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 Esophageal stricture   . Dyspepsia   . CAD (coronary artery disease)     Cath 2008, all grafts patent,   . HTN (hypertension)   . Hyperlipidemia   . GERD (gastroesophageal reflux disease)   . Thrombosis of arm     Left  . Elevated PSA 02/15/2011  . ICD (implantable cardiac defibrillator) battery depletion     Placed 2008  (eepisodes  nonsustained monomorphic VT in the past)  . Hx of CABG     1991  . Venous thrombosis     Left arm  . Carotid artery disease ([place])     Doppler, February, 2012, mild plaque, 0-39% bilateral, plan  followup in one year  . Prostate cancer ([place]) 2012    IMRT, ADT  . Ejection fraction < 50%   . Myocardial infarction ([place]) 1998  . AICD (automatic cardioverter/defibrillator) present     Past Surgical History  Procedure Laterality Date  . Coronary artery bypass graft      LIMA_LAD, SVG-OM, Cx, RCA '91  . Rotator cuff repair  2005, 2007  . Tonsillectomy  2005    Malignant lesion, w/XRT  . Cystectomy      Hand  . Icd placement    . Thumb surgery    . 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 Cardiac catheterization  12/05/06  . Menistectomies, left knee  '09  . Implantable cardioverter defibrillator (icd) generator change  04/06/2015    Procedure: Icd Generator Change;  Surgeon: Evans Lance, MD;  Location: [place] [facility];  Service: Cardiovascular;;  . Cardiac catheterization N/A 12/21/2015    Procedure: Left Heart Cath and Coronary Angiography;  Surgeon: Lorretta Harp, MD;  Location: [place] [facility];  Service: Cardiovascular;  Laterality: N/A;    Patient Active Problem List   Diagnosis Date Noted  . Epigastric pain 12/21/2015  . Atypical chest pain 12/21/2015  . ST elevation myocardial  infarction (STEMI) ([place])   . Hx of CABG   . Adult ADHD   . Ejection fraction < 50%   . ICD (implantable cardioverter-defibrillator) in place 03/10/2015  . Ventricular tachycardia ([place]) 03/05/2014  . Routine health maintenance 12/11/2013  . Advanced care planning/counseling discussion 12/10/2013  . Cardiomyopathy, ischemic   . CAD (coronary artery disease)   . Essential hypertension   . Hyperlipidemia   . Encounter for servicing of automatic implantable cardioverter-defibrillator (AICD) at end of battery life   . Carotid artery disease ([place])   . Prostate cancer ([place]) 11/09/2011  . 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 ESOPHAGEAL STRICTURE 08/23/2008      Current Outpatient Prescriptions  Medication Sig Dispense Refill  .  amLODipine (NORVASC) 5 MG tablet Take 1 tablet (5 mg total) by mouth daily. 90 tablet 3  . aspirin 81 MG EC tablet Take 81 mg by mouth daily.      Marland Kitchen atorvastatin (LIPITOR) 80 MG tablet TAKE 1 TABLET BY MOUTH EVERY DAY 90 tablet 3  . busPIRone (BUSPAR) 30 MG tablet Take 30 mg by mouth daily.    . carvedilol (COREG) 25 MG tablet TAKE 1 TABLET BY MOUTH TWICE A DAY 180 tablet 3  . EPINEPHrine (EPIPEN) 0.3 mg/0.3 mL DEVI Inject 0.3 mLs (0.3 mg total) into the muscle as needed. 2 Device 0  . escitalopram (LEXAPRO) 20 MG tablet Take 20 mg by mouth daily.      . Multiple Vitamins-Minerals (MULTIVITAMIN,TX-MINERALS) tablet Take 1 tablet by mouth daily.      . Multiple Vitamins-Minerals (OCUVITE PRESERVISION) TABS Take 1 tablet by mouth daily.     . nitroGLYCERIN (NITROSTAT) 0.4 MG SL tablet Place 1 tablet (0.4 mg total) under the tongue every 5 (five) minutes as needed for chest pain. 25 tablet 12  . NON FORMULARY Joint Juice: 8 oz daily     . pantoprazole (PROTONIX) 40 MG tablet Take 40 mg by mouth daily.  3  . valsartan (DIOVAN) 160 MG tablet TAKE 1 TABLET BY MOUTH TWICE A DAY 180 tablet 1  . zolpidem (AMBIEN) 10 MG tablet Take 1 tablet by mouth at bedtime as needed for sleep.      No current facility-administered medications for this visit.    Allergies:   Other    Social History:  The patient  reports that he has never smoked. He has never used smokeless tobacco. He reports that he drinks about 14.4 oz of alcohol per week. He reports that he does not use illicit drugs.   Family History:  The patient's family history includes Alzheimer's disease in his mother; Breast cancer in his sister; CAD in his father; Congestive Heart Failure in his father; Dementia in his mother; Heart disease in his brother; Hyperlipidemia in his father; Hypertension in his father. There is no history of Colon cancer, Prostate cancer, or Diabetes.    ROS:  Please see the history of present illness.  Patient denies fever,  chills, headache, sweats, rash, change in vision, change in hearing, chest pain, cough, nausea or vomiting, urinary symptoms. All other systems are reviewed and are negative.   PHYSICAL EXAM: VS:  BP 102/78 mmHg  Pulse 48  Ht 6' (1.829 m)  Wt 200 lb (90.719 kg)  BMI 27.12 kg/m2 , patient is oriented to person time and place. Affect is normal. Head is atraumatic. Sclera conjunctiva are normal. There is no jugular venous distention. Lungs are clear. Respiratory effort is unlabored. Cardiac exam reveals an S1  and S2. The abdomen is soft. There is no peripheral edema. There are no musculoskeletal deformities. There are no skin rashes.   EKG:   EKG is not done today. He had recent follow-up after his ICD generator replacement  Recent Labs: 12/22/2015: ALT 26; Hemoglobin 13.3; Platelets 151 12/23/2015: BUN 18; Creatinine, Ser 1.34*; Potassium 3.7; Sodium 139    Lipid Panel    Component Value Date/Time   CHOL 191 01/13/2015 1654   TRIG 189.0* 01/13/2015 1654   HDL 64.60 01/13/2015 1654   CHOLHDL 3 01/13/2015 1654   VLDL 37.8 01/13/2015 1654   LDLCALC 89 01/13/2015 1654      Wt Readings from Last 3 Encounters:  12/30/15 200 lb (90.719 kg)  12/23/15 207 lb 3.7 oz (94 kg)  08/10/15 206 lb (93.441 kg)      Current medicines are reviewed  The patient understands his medications.     ASSESSMENT AND PLAN:  1. CAD -  His native coronary arteries are completely occluded. He had patent LIMA to the LAD and saphenous vein graft to the marginal system and to the RCA. He was recently admitted to the hospital with severe chest pain. His troponins were negative. His grafts were patent. At this point does not appear that his chest pain was due to a cardiac etiology. He will continue with his same medications.  2. Chronic systolic CHF -  He's on maximal dose carvedilol and Diovan. We can consider adding spironolactone in the future. He has an ICD in place. His blood pressure is on the low  side . 3. Essential HTN -  Continue current medications. 4. ICD  5. Hyperlipidemia   Nahser, Wonda Cheng, MD  12/30/2015 11:08 AM    [facility] [address],  [place], [place]  [address] Pager [phone] Phone: [phone]; Fax: [phone]   [facility]  [address] [place], [place]  [address] [phone]   Fax [phone] \

## 2015-12-31 ENCOUNTER — Other Ambulatory Visit: Payer: Self-pay | Admitting: Internal Medicine

## 2016-01-05 ENCOUNTER — Encounter: Payer: Self-pay | Admitting: Internal Medicine

## 2016-01-05 ENCOUNTER — Ambulatory Visit (INDEPENDENT_AMBULATORY_CARE_PROVIDER_SITE_OTHER): Payer: Medicare Other | Admitting: Internal Medicine

## 2016-01-05 ENCOUNTER — Other Ambulatory Visit (INDEPENDENT_AMBULATORY_CARE_PROVIDER_SITE_OTHER): Payer: Medicare Other

## 2016-01-05 VITALS — BP 120/90 | HR 56 | Temp 98.6°F | Resp 14 | Ht 72.0 in | Wt 200.1 lb

## 2016-01-05 DIAGNOSIS — R7301 Impaired fasting glucose: Secondary | ICD-10-CM

## 2016-01-05 DIAGNOSIS — R0789 Other chest pain: Secondary | ICD-10-CM | POA: Diagnosis not present

## 2016-01-05 DIAGNOSIS — R1013 Epigastric pain: Secondary | ICD-10-CM | POA: Diagnosis not present

## 2016-01-05 LAB — HEMOGLOBIN A1C: HEMOGLOBIN A1C: 5.7 % (ref 4.6–6.5)

## 2016-01-05 NOTE — Progress Notes (Signed)
Pre visit review using our clinic review tool, if applicable. No additional management support is needed unless otherwise documented below in the visit note. 

## 2016-01-05 NOTE — Patient Instructions (Signed)
We will check the blood work today for the sugars and call you back with the results.   Come back sometime in the summer for a physical. If you have any questions or problems before then please feel free to call us.

## 2016-01-10 ENCOUNTER — Encounter: Payer: Self-pay | Admitting: Internal Medicine

## 2016-01-10 DIAGNOSIS — R7301 Impaired fasting glucose: Secondary | ICD-10-CM | POA: Insufficient documentation

## 2016-01-10 NOTE — Assessment & Plan Note (Signed)
Suspect that this was related to GERD and has improved since discharge. He is overdue for colonoscopy but declines at this time. Will readdress at his physical.

## 2016-01-10 NOTE — Assessment & Plan Note (Signed)
Checking HgA1c today and treat as appropriate. On monitoring in the hospital he did have elevated sugars.

## 2016-01-10 NOTE — Assessment & Plan Note (Signed)
Pantoprazole has taken away all symptoms and will observe.

## 2016-01-10 NOTE — Progress Notes (Signed)
   Subjective:    Patient ID: [name], male    DOB: 02/26/1946, 70 y.o.   MRN: [id number]  HPI The patient is a 70 YO man coming in for hospital follow up (in for chest pain with repeat cath due to STEMI concern for EKG, no culprit lesion and treated medically as GERD). He was given PPI to take and this has helped immensely for his pain. He denies recurrence. Able to exercise as usual without pain. No falls since being home and back to normal stamina. He had high sugars in the hospital which were not evaluated.   PMH, [facility], social history reviewed and updated.   Review of Systems  Constitutional: Negative for chills, activity change, appetite change, fatigue and unexpected weight change.  HENT: Negative.   Eyes: Negative.   Respiratory: Negative for cough, chest tightness, shortness of breath and wheezing.   Cardiovascular: Negative for chest pain, palpitations and leg swelling.  Gastrointestinal: Negative for abdominal pain, diarrhea, constipation, blood in stool and abdominal distention.  Endocrine: Negative.   Musculoskeletal: Negative.   Skin: Negative.   Neurological: Negative.   Psychiatric/Behavioral: Negative.       Objective:   Physical Exam  Constitutional: He is oriented to person, place, and time. He appears well-developed and well-nourished.  HENT:  Head: Normocephalic and atraumatic.  Eyes: EOM are normal.  Neck: Normal range of motion.  Cardiovascular: Normal rate.   Pulmonary/Chest: Effort normal and breath sounds normal.  Abdominal: Soft. Bowel sounds are normal. He exhibits no distension. There is no tenderness.  Musculoskeletal: He exhibits no edema.  Neurological: He is alert and oriented to person, place, and time. Coordination normal.  Skin: Skin is warm and dry.  Psychiatric: He has a normal mood and affect.   Filed Vitals:   01/05/16 1039  BP: 120/90  Pulse: 56  Temp: 98.6 F (37 C)  TempSrc: Oral  Resp: 14  Height: 6' (1.829 m)    Weight: 200 lb 1.9 oz (90.774 kg)  SpO2: 96%      Assessment & Plan:

## 2016-01-26 ENCOUNTER — Ambulatory Visit (INDEPENDENT_AMBULATORY_CARE_PROVIDER_SITE_OTHER): Payer: Medicare Other | Admitting: *Deleted

## 2016-01-26 DIAGNOSIS — I472 Ventricular tachycardia, unspecified: Secondary | ICD-10-CM

## 2016-01-26 DIAGNOSIS — I255 Ischemic cardiomyopathy: Secondary | ICD-10-CM | POA: Diagnosis not present

## 2016-01-26 NOTE — Progress Notes (Signed)
Remote ICD transmission.   

## 2016-01-28 ENCOUNTER — Other Ambulatory Visit: Payer: Self-pay | Admitting: Cardiology

## 2016-02-04 ENCOUNTER — Ambulatory Visit (INDEPENDENT_AMBULATORY_CARE_PROVIDER_SITE_OTHER): Payer: Medicare Other | Admitting: Ophthalmology

## 2016-02-04 DIAGNOSIS — D3132 Benign neoplasm of left choroid: Secondary | ICD-10-CM | POA: Diagnosis not present

## 2016-02-04 DIAGNOSIS — H35033 Hypertensive retinopathy, bilateral: Secondary | ICD-10-CM

## 2016-02-04 DIAGNOSIS — H43813 Vitreous degeneration, bilateral: Secondary | ICD-10-CM

## 2016-02-04 DIAGNOSIS — I10 Essential (primary) hypertension: Secondary | ICD-10-CM | POA: Diagnosis not present

## 2016-02-04 DIAGNOSIS — H2513 Age-related nuclear cataract, bilateral: Secondary | ICD-10-CM

## 2016-02-04 DIAGNOSIS — H353132 Nonexudative age-related macular degeneration, bilateral, intermediate dry stage: Secondary | ICD-10-CM

## 2016-02-24 DIAGNOSIS — C61 Malignant neoplasm of prostate: Secondary | ICD-10-CM | POA: Diagnosis not present

## 2016-02-26 ENCOUNTER — Encounter: Payer: Self-pay | Admitting: Cardiology

## 2016-02-26 LAB — CUP PACEART REMOTE DEVICE CHECK
Battery Remaining Longevity: 134 mo
HighPow Impedance: 42 Ohm
HighPow Impedance: 52 Ohm
Implantable Lead Implant Date: 20080108
Implantable Lead Location: 753860
Lead Channel Pacing Threshold Pulse Width: 0.4 ms
Lead Channel Setting Pacing Amplitude: 2 V
Lead Channel Setting Pacing Pulse Width: 0.4 ms
Lead Channel Setting Sensing Sensitivity: 0.3 mV
MDC IDC MSMT BATTERY VOLTAGE: 3.03 V
MDC IDC MSMT LEADCHNL RV IMPEDANCE VALUE: 456 Ohm
MDC IDC MSMT LEADCHNL RV IMPEDANCE VALUE: 513 Ohm
MDC IDC MSMT LEADCHNL RV PACING THRESHOLD AMPLITUDE: 0.5 V
MDC IDC MSMT LEADCHNL RV SENSING INTR AMPL: 19.875 mV
MDC IDC MSMT LEADCHNL RV SENSING INTR AMPL: 19.875 mV
MDC IDC SESS DTM: 20170228093722
MDC IDC STAT BRADY RV PERCENT PACED: 3.24 %

## 2016-03-02 ENCOUNTER — Other Ambulatory Visit: Payer: Self-pay | Admitting: Cardiology

## 2016-03-02 DIAGNOSIS — R3916 Straining to void: Secondary | ICD-10-CM | POA: Diagnosis not present

## 2016-03-02 DIAGNOSIS — N401 Enlarged prostate with lower urinary tract symptoms: Secondary | ICD-10-CM | POA: Diagnosis not present

## 2016-03-02 DIAGNOSIS — C61 Malignant neoplasm of prostate: Secondary | ICD-10-CM | POA: Diagnosis not present

## 2016-03-02 DIAGNOSIS — Z0000 Encounter for general adult medical examination without abnormal findings: Secondary | ICD-10-CM | POA: Diagnosis not present

## 2016-03-02 DIAGNOSIS — N5201 Erectile dysfunction due to arterial insufficiency: Secondary | ICD-10-CM | POA: Diagnosis not present

## 2016-03-08 DIAGNOSIS — F900 Attention-deficit hyperactivity disorder, predominantly inattentive type: Secondary | ICD-10-CM | POA: Diagnosis not present

## 2016-03-08 DIAGNOSIS — F411 Generalized anxiety disorder: Secondary | ICD-10-CM | POA: Diagnosis not present

## 2016-03-08 DIAGNOSIS — F331 Major depressive disorder, recurrent, moderate: Secondary | ICD-10-CM | POA: Diagnosis not present

## 2016-03-08 DIAGNOSIS — F422 Mixed obsessional thoughts and acts: Secondary | ICD-10-CM | POA: Diagnosis not present

## 2016-03-18 ENCOUNTER — Encounter (HOSPITAL_COMMUNITY): Payer: Self-pay | Admitting: *Deleted

## 2016-03-18 ENCOUNTER — Telehealth: Payer: Self-pay | Admitting: Internal Medicine

## 2016-03-18 ENCOUNTER — Emergency Department (HOSPITAL_COMMUNITY)
Admission: EM | Admit: 2016-03-18 | Discharge: 2016-03-18 | Discharge status: Against Medical Advice | Payer: Medicare Other | Attending: Emergency Medicine | Admitting: Emergency Medicine

## 2016-03-18 ENCOUNTER — Emergency Department (HOSPITAL_COMMUNITY): Payer: Medicare Other

## 2016-03-18 DIAGNOSIS — I10 Essential (primary) hypertension: Secondary | ICD-10-CM | POA: Diagnosis not present

## 2016-03-18 DIAGNOSIS — R945 Abnormal results of liver function studies: Secondary | ICD-10-CM | POA: Insufficient documentation

## 2016-03-18 DIAGNOSIS — Z85818 Personal history of malignant neoplasm of other sites of lip, oral cavity, and pharynx: Secondary | ICD-10-CM | POA: Insufficient documentation

## 2016-03-18 DIAGNOSIS — R1013 Epigastric pain: Secondary | ICD-10-CM | POA: Diagnosis not present

## 2016-03-18 DIAGNOSIS — Z8659 Personal history of other mental and behavioral disorders: Secondary | ICD-10-CM | POA: Diagnosis not present

## 2016-03-18 DIAGNOSIS — Z79899 Other long term (current) drug therapy: Secondary | ICD-10-CM | POA: Diagnosis not present

## 2016-03-18 DIAGNOSIS — I2510 Atherosclerotic heart disease of native coronary artery without angina pectoris: Secondary | ICD-10-CM | POA: Diagnosis not present

## 2016-03-18 DIAGNOSIS — K828 Other specified diseases of gallbladder: Secondary | ICD-10-CM | POA: Diagnosis not present

## 2016-03-18 DIAGNOSIS — K219 Gastro-esophageal reflux disease without esophagitis: Secondary | ICD-10-CM | POA: Diagnosis not present

## 2016-03-18 DIAGNOSIS — Z9889 Other specified postprocedural states: Secondary | ICD-10-CM | POA: Diagnosis not present

## 2016-03-18 DIAGNOSIS — K5730 Diverticulosis of large intestine without perforation or abscess without bleeding: Secondary | ICD-10-CM | POA: Diagnosis not present

## 2016-03-18 DIAGNOSIS — E785 Hyperlipidemia, unspecified: Secondary | ICD-10-CM | POA: Diagnosis not present

## 2016-03-18 DIAGNOSIS — Z8546 Personal history of malignant neoplasm of prostate: Secondary | ICD-10-CM | POA: Diagnosis not present

## 2016-03-18 DIAGNOSIS — R1012 Left upper quadrant pain: Secondary | ICD-10-CM

## 2016-03-18 DIAGNOSIS — Z86718 Personal history of other venous thrombosis and embolism: Secondary | ICD-10-CM | POA: Insufficient documentation

## 2016-03-18 DIAGNOSIS — I252 Old myocardial infarction: Secondary | ICD-10-CM | POA: Insufficient documentation

## 2016-03-18 DIAGNOSIS — R7989 Other specified abnormal findings of blood chemistry: Secondary | ICD-10-CM

## 2016-03-18 DIAGNOSIS — Z7982 Long term (current) use of aspirin: Secondary | ICD-10-CM | POA: Insufficient documentation

## 2016-03-18 DIAGNOSIS — R17 Unspecified jaundice: Secondary | ICD-10-CM

## 2016-03-18 LAB — URINALYSIS, ROUTINE W REFLEX MICROSCOPIC
Glucose, UA: NEGATIVE mg/dL
Hgb urine dipstick: NEGATIVE
KETONES UR: 15 mg/dL — AB
NITRITE: POSITIVE — AB
PH: 8 (ref 5.0–8.0)
PROTEIN: NEGATIVE mg/dL
Specific Gravity, Urine: 1.024 (ref 1.005–1.030)

## 2016-03-18 LAB — COMPREHENSIVE METABOLIC PANEL
ALK PHOS: 126 U/L (ref 38–126)
ALT: 140 U/L — ABNORMAL HIGH (ref 17–63)
AST: 215 U/L — AB (ref 15–41)
Albumin: 3.6 g/dL (ref 3.5–5.0)
Anion gap: 12 (ref 5–15)
BILIRUBIN TOTAL: 3.8 mg/dL — AB (ref 0.3–1.2)
BUN: 18 mg/dL (ref 6–20)
CALCIUM: 9.4 mg/dL (ref 8.9–10.3)
CO2: 22 mmol/L (ref 22–32)
Chloride: 106 mmol/L (ref 101–111)
Creatinine, Ser: 1.5 mg/dL — ABNORMAL HIGH (ref 0.61–1.24)
GFR calc Af Amer: 53 mL/min — ABNORMAL LOW (ref >60)
GFR, EST NON AFRICAN AMERICAN: 46 mL/min — AB (ref >60)
Glucose, Bld: 136 mg/dL — ABNORMAL HIGH (ref 65–99)
POTASSIUM: 4.3 mmol/L (ref 3.5–5.1)
Sodium: 140 mmol/L (ref 135–145)
TOTAL PROTEIN: 6.6 g/dL (ref 6.5–8.1)

## 2016-03-18 LAB — CBC
HEMATOCRIT: 41.3 % (ref 39.0–52.0)
Hemoglobin: 13.5 g/dL (ref 13.0–17.0)
MCH: 28.5 pg (ref 26.0–34.0)
MCHC: 32.7 g/dL (ref 30.0–36.0)
MCV: 87.1 fL (ref 78.0–100.0)
PLATELETS: 159 10*3/uL (ref 150–400)
RBC: 4.74 MIL/uL (ref 4.22–5.81)
RDW: 15.4 % (ref 11.5–15.5)
WBC: 15.7 10*3/uL — AB (ref 4.0–10.5)

## 2016-03-18 LAB — URINE MICROSCOPIC-ADD ON

## 2016-03-18 LAB — LIPASE, BLOOD: Lipase: 25 U/L (ref 11–51)

## 2016-03-18 MED ORDER — OXYCODONE HCL 5 MG PO TABS: 5.0000 mg | ORAL_TABLET | ORAL | Status: DC | PRN

## 2016-03-18 MED ORDER — ALUM & MAG HYDROXIDE-SIMETH 200-200-20 MG/5ML PO SUSP
15.0000 mL | Freq: Once | ORAL | Status: AC
  Administered 2016-03-18: 15 mL via ORAL
  Filled 2016-03-18: qty 30

## 2016-03-18 MED ORDER — ONDANSETRON HCL 4 MG/2ML IJ SOLN
4.0000 mg | Freq: Once | INTRAMUSCULAR | Status: AC
  Administered 2016-03-18: 4 mg via INTRAVENOUS
  Filled 2016-03-18: qty 2

## 2016-03-18 MED ORDER — ONDANSETRON 4 MG PO TBDP: ORAL_TABLET | ORAL | Status: DC

## 2016-03-18 MED ORDER — MORPHINE SULFATE (PF) 4 MG/ML IV SOLN
4.0000 mg | Freq: Once | INTRAVENOUS | Status: AC
  Administered 2016-03-18: 4 mg via INTRAVENOUS
  Filled 2016-03-18: qty 1

## 2016-03-18 MED ORDER — IOPAMIDOL (ISOVUE-300) INJECTION 61%
INTRAVENOUS | Status: AC
  Administered 2016-03-18: 75 mL
  Filled 2016-03-18: qty 75

## 2016-03-18 NOTE — Discharge Instructions (Signed)
Abdominal Pain, Adult Many things can cause abdominal pain. Usually, abdominal pain is not caused by a disease and will improve without treatment. It can often be observed and treated at home. Your health care provider will do a physical exam and possibly order blood tests and X-rays to help determine the seriousness of your pain. However, in many cases, more time must pass before a clear cause of the pain can be found. Before that point, your health care provider may not know if you need more testing or further treatment. HOME CARE INSTRUCTIONS Monitor your abdominal pain for any changes. The following actions may help to alleviate any discomfort you are experiencing:  Only take over-the-counter or prescription medicines as directed by your health care provider.  Do not take laxatives unless directed to do so by your health care provider.  Try a clear liquid diet (broth, tea, or water) as directed by your health care provider. Slowly move to a bland diet as tolerated. SEEK MEDICAL CARE IF:  You have unexplained abdominal pain.  You have abdominal pain associated with nausea or diarrhea.  You have pain when you urinate or have a bowel movement.  You experience abdominal pain that wakes you in the night.  You have abdominal pain that is worsened or improved by eating food.  You have abdominal pain that is worsened with eating fatty foods.  You have a fever. SEEK IMMEDIATE MEDICAL CARE IF:  Your pain does not go away within 2 hours.  You keep throwing up (vomiting).  Your pain is felt only in portions of the abdomen, such as the right side or the left lower portion of the abdomen.  You pass bloody or black tarry stools. MAKE SURE YOU:  Understand these instructions.  Will watch your condition.  Will get help right away if you are not doing well or get worse.   This information is not intended to replace advice given to you by your health care provider. Make sure you discuss  any questions you have with your health care provider.   Document Released: 08/24/2005 Document Revised: 08/05/2015 Document Reviewed: 07/24/2013 Elsevier Interactive Patient Education 2016 Elsevier Inc. Cholecystitis Cholecystitis is inflammation of the gallbladder. It is often called a gallbladder attack. The gallbladder is a pear-shaped organ that lies beneath the liver on the right side of the body. The gallbladder stores bile, which is a fluid that helps the body to digest fats. If bile builds up in your gallbladder, your gallbladder becomes inflamed. This condition may occur suddenly (be acute). Repeat episodes of acute cholecystitis or prolonged episodes may lead to a long-term (chronic) condition. Cholecystitis is serious and it requires treatment.  CAUSES The most common cause of this condition is gallstones. Gallstones can block the tube (duct) that carries bile out of your gallbladder. This causes bile to build up. Other causes of this condition include:  Damage to the gallbladder due to a decrease in blood flow.  Infections in the bile ducts.  Scars or kinks in the bile ducts.  Tumors in the liver, pancreas, or gallbladder. RISK FACTORS This condition is more likely to develop in:  People who have sickle cell disease.  People who take birth control pills or use estrogen.  People who have alcoholic liver disease.  People who have liver cirrhosis.  People who have their nutrition delivered through a vein (parenteral nutrition).  People who do not eat or drink (do fasting) for a long period of time.  People who are  obese.  People who have rapid weight loss.  People who are pregnant.  People who have increased triglyceride levels.  People who have pancreatitis. SYMPTOMS Symptoms of this condition include:  Abdominal pain, especially in the upper right area of the abdomen.  Abdominal tenderness or bloating.  Nausea.  Vomiting.  Fever.  Chills.  Yellowing  of the skin and the whites of the eyes (jaundice). DIAGNOSIS This condition is diagnosed with a medical history and physical exam. You may also have other tests, including:  Imaging tests, such as:  An ultrasound of the gallbladder.  A CT scan of the abdomen.  A gallbladder nuclear scan (HIDA scan). This scan allows your health care provider to see the bile moving from your liver to your gallbladder and to your small intestine.  MRI.  Blood tests, such as:  A complete blood count, because the white blood cell count may be higher than normal.  Liver function tests, because some levels may be higher than normal with certain types of gallstones. TREATMENT Treatment may include:  Fasting for a certain amount of time.  IV fluids.  Medicine to treat pain or vomiting.  Antibiotic medicine.  Surgery to remove your gallbladder (cholecystectomy). This may happen immediately or at a later time. [place] care will depend on your treatment. In general:  Take over-the-counter and prescription medicines only as told by your health care provider.  If you were prescribed an antibiotic medicine, take it as told by your health care provider. Do not stop taking the antibiotic even if you start to feel better.  Follow instructions from your health care provider about what to eat or drink. When you are allowed to eat, avoid eating or drinking anything that triggers your symptoms.  Keep all follow-up visits as told by your health care provider. This is important. SEEK MEDICAL CARE IF:  Your pain is not controlled with medicine.  You have a fever. SEEK IMMEDIATE MEDICAL CARE IF:  Your pain moves to another part of your abdomen or to your back.  You continue to have symptoms or you develop new symptoms even with treatment.   This information is not intended to replace advice given to you by your health care provider. Make sure you discuss any questions you have with your  health care provider.   Document Released: 11/14/2005 Document Revised: 08/05/2015 Document Reviewed: 02/25/2015 Elsevier Interactive Patient Education [employer].

## 2016-03-18 NOTE — Telephone Encounter (Signed)
Patient Name: [name]  DOB: 12/05/1945    Initial Comment Caller states husband was in hospital in January, dx possible pancreatitis or gallbladder issue. Had pain again, not as bad, has fever 101.4 and headache.   Nurse Assessment  Nurse: Verlin Fester RN, [facility] Kidney Date/Time (Eastern Time): 03/18/2016 8:59:21 AM  Confirm and document reason for call. If symptomatic, describe symptoms. You must click the next button to save text entered. ---Caller states husband was in hospital in January, dx possible pancreatitis or gallbladder issue. Had pain again, not as bad, has fever 101.4 and headache. The pain is in the left side of right below the ribs  Has the patient traveled out of the country within the last 30 days? ---No  Does the patient have any new or worsening symptoms? ---Yes  Will a triage be completed? ---Yes  Related visit to physician within the last 2 weeks? ---No  Does the PT have any chronic conditions? (i.e. diabetes, asthma, etc.) ---Yes  List chronic conditions. ---"heart problems, HTN  Is this a behavioral health or substance abuse call? ---No     Guidelines    Guideline Title Affirmed Question Affirmed Notes  Abdominal Pain - Upper [1] Pain lasts > 10 minutes AND [2] age > 40    Final Disposition User   Go to ED Now Verlin Fester, RN, [facility]    Referrals  REFERRED TO PCP OFFICE   Disagree/Comply: Disagree  Disagree/Comply Reason: Disagree with instructions

## 2016-03-18 NOTE — ED Notes (Signed)
Family at bedside. 

## 2016-03-18 NOTE — ED Provider Notes (Signed)
Patient's pain is resolved. CT with evidence of call or wall thickening. Discussed with Dr. Barry Dienes. Recommends ultrasound. Ultrasound demonstrated gallbladder wall thickening and sludge in the gallbladder. Dr. Barry Dienes in the emergency department to evaluate patient. Recommended admission given laboratory abnormalities. Patient understands the risks of leaving [place] which include progression of his symptoms and even death. He signed paperwork stating this. He's been given precautions to return immediately for worsening pain, fever or for any concerns. He's also been given follow-up with Dr. Barry Dienes.  Julianne Rice, MD 03/18/16 2028

## 2016-03-18 NOTE — Telephone Encounter (Signed)
Spoke to rickie. Advised of below. They are headed to [facility]

## 2016-03-18 NOTE — Telephone Encounter (Signed)
Would need to seek care in the ER, if not willing see if he can be scheduled at [facility] today.

## 2016-03-18 NOTE — ED Provider Notes (Signed)
CSN: [id number]     Arrival date & time 03/18/16  1112 History   First MD Initiated Contact with Patient 03/18/16 1339     Chief Complaint  Patient presents with  . Abdominal Pain     (Consider location/radiation/quality/duration/timing/severity/associated sxs/prior Treatment) Patient is a 70 y.o. male presenting with abdominal pain. The history is provided by the patient.  Abdominal Pain Pain location:  LUQ Pain quality: bloating, burning, sharp and shooting   Pain radiates to:  Does not radiate Pain severity:  Moderate Onset quality:  Gradual Duration:  2 days Timing:  Constant Progression:  Waxing and waning Chronicity:  Recurrent Context: alcohol use   Relieved by:  Nothing Worsened by:  Nothing tried Ineffective treatments:  None tried Associated symptoms: nausea   Associated symptoms: no chest pain, no chills, no diarrhea, no fever, no shortness of breath and no vomiting    70 yo M with LUQ abdominal pain, going on for past couple days.  Hx of similar symptoms in the past and was kept in the hospital for about 6 days with no noted etiology per the family. Old records reviewed, patient with ? STEMI, no noted lesions noted, having significant GI symptoms thought to be GI in nature had a negative RUQ [place] as well as CT.    He has had a fever for past couple days as well, 101.4 high.   Past Medical History  Diagnosis Date  . Adult ADHD   . 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 Esophageal stricture   . Dyspepsia   . CAD (coronary artery disease)     Cath 2008, all grafts patent,   . HTN (hypertension)   . Hyperlipidemia   . GERD (gastroesophageal reflux disease)   . Thrombosis of arm     Left  . Elevated PSA 02/15/2011  . ICD (implantable cardiac defibrillator) battery depletion     Placed 2008  (eepisodes  nonsustained monomorphic VT in the past)  . Hx of  CABG     1991  . Venous thrombosis     Left arm  . Carotid artery disease ([place])     Doppler, February, 2012, mild plaque, 0-39% bilateral, plan followup in one year  . Prostate cancer ([place]) 2012    IMRT, ADT  . Ejection fraction < 50%   . Myocardial infarction ([place]) 1998  . AICD (automatic cardioverter/defibrillator) present    Past Surgical History  Procedure Laterality Date  . Coronary artery bypass graft      LIMA_LAD, SVG-OM, Cx, RCA '91  . Rotator cuff repair  2005, 2007  . Tonsillectomy  2005    Malignant lesion, w/XRT  . Cystectomy      Hand  . Icd placement    . Thumb surgery    . 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 Cardiac catheterization  12/05/06  . Menistectomies, left knee  '09  . Implantable cardioverter defibrillator (icd) generator change  04/06/2015    Procedure: Icd Generator Change;  Surgeon: Evans Lance, MD;  Location: [place] [facility];  Service: Cardiovascular;;  . Cardiac catheterization N/A 12/21/2015    Procedure: Left Heart Cath and Coronary Angiography;  Surgeon: Lorretta Harp, MD;  Location: [place] [facility];  Service: Cardiovascular;  Laterality: N/A;   Family History  Problem Relation Age of Onset  . Alzheimer's disease Mother   . Dementia Mother     living in [place]  . Hyperlipidemia Father   . Hypertension Father   . CAD Father     had pacemaker  . Heart disease Brother     Bypass surgery  . Colon cancer Neg Hx   . Prostate cancer Neg Hx   . Diabetes Neg Hx   . Breast cancer Sister     survivor  . Congestive Heart Failure Father    Social History  Substance Use Topics  . Smoking status: Never Smoker   . Smokeless tobacco: Never Used  . Alcohol Use: 14.4 oz/week    24 Cans of beer per week    Review of Systems  Constitutional: Negative for fever and chills.  HENT: Negative for congestion and facial swelling.   Eyes: Negative for discharge and visual disturbance.   Respiratory: Negative for shortness of breath.   Cardiovascular: Negative for chest pain and palpitations.  Gastrointestinal: Positive for nausea and abdominal pain. Negative for vomiting and diarrhea.  Musculoskeletal: Negative for myalgias and arthralgias.  Skin: Negative for color change and rash.  Neurological: Negative for tremors, syncope and headaches.  Psychiatric/Behavioral: Negative for confusion and dysphoric mood.      Allergies  Other  Home Medications   Prior to Admission medications   Medication Sig Start Date End Date Taking? Authorizing Provider  amLODipine (NORVASC) 5 MG tablet TAKE 1 TABLET BY MOUTH EVERY DAY 01/28/16  Yes Thayer Headings, MD  aspirin 81 MG EC tablet Take 81 mg by mouth daily.     Yes Historical Provider, MD  atorvastatin (LIPITOR) 80 MG tablet TAKE 1 TABLET BY MOUTH EVERY DAY 03/02/16  Yes Thayer Headings, MD  buPROPion (WELLBUTRIN XL) 300 MG 24 hr tablet Take 300 mg by mouth daily.   Yes Historical Provider, MD  busPIRone (BUSPAR) 30 MG tablet Take 20 mg by mouth daily.    Yes Historical Provider, MD  carvedilol (COREG) 25 MG tablet TAKE 1 TABLET BY MOUTH TWICE A DAY 10/05/15  Yes Carlena Bjornstad, MD  EPINEPHrine (EPIPEN) 0.3 mg/0.3 mL DEVI Inject 0.3 mLs (0.3 mg total) into the muscle as needed. 06/09/12  Yes Virgel Manifold, MD  escitalopram (LEXAPRO) 20 MG tablet Take 20 mg by mouth daily.     Yes Historical Provider, MD  esomeprazole (NEXIUM) 20 MG capsule Take 20 mg by mouth daily at 12 noon.   Yes Historical Provider, MD  Multiple Vitamins-Minerals (MULTIVITAMIN,TX-MINERALS) tablet Take 1 tablet by mouth daily.     Yes Historical Provider, MD  Multiple Vitamins-Minerals (OCUVITE PRESERVISION) TABS Take 1 tablet by mouth daily.    Yes Historical Provider, MD  NON FORMULARY Joint Juice: 8 oz daily    Yes Historical Provider, MD  pantoprazole (PROTONIX) 40 MG tablet TAKE 1 TABLET BY MOUTH EVERY DAY 12/31/15  Yes Hoyt Koch, MD  valsartan  (DIOVAN) 160 MG tablet TAKE 1 TABLET BY MOUTH TWICE A DAY 10/07/15  Yes Carlena Bjornstad, MD  zolpidem (AMBIEN) 10 MG tablet Take 1 tablet by mouth at bedtime as needed for sleep.  02/28/14  Yes Historical Provider, MD  nitroGLYCERIN (NITROSTAT) 0.4 MG SL tablet Place 1 tablet (0.4 mg total) under the tongue every 5 (five) minutes as needed for chest pain. 12/23/15   Bhavinkumar Bhagat, PA   BP 124/78 mmHg  Pulse 62  Temp(Src) 99.5 F (37.5 C) (Oral)  Resp 18  Ht 6' (1.829 m)  Wt 200 lb (90.719 kg)  BMI 27.12 kg/m2  SpO2 96% Physical Exam  Constitutional: He is oriented to person, place, and time. He appears well-developed and well-nourished.  HENT:  Head: Normocephalic and atraumatic.  Eyes: EOM are normal. Pupils are equal, round, and reactive to light.  Neck: Normal range of motion. Neck supple. No JVD present.  Cardiovascular: Normal rate and regular rhythm.  Exam reveals no gallop and no friction rub.   No murmur heard. Pulmonary/Chest: No respiratory distress. He has no wheezes.  Abdominal: He exhibits no distension. There is tenderness (worst to the epigastrium). There is no rebound and no guarding.  Musculoskeletal: Normal range of motion.  Neurological: He is alert and oriented to person, place, and time.  Skin: No rash noted. No pallor.  Psychiatric: He has a normal mood and affect. His behavior is normal.  Nursing note and vitals reviewed.   ED Course  Procedures (including critical care time) Labs Review Labs Reviewed  COMPREHENSIVE METABOLIC PANEL - Abnormal; Notable for the following:    Glucose, Bld 136 (*)    Creatinine, Ser 1.50 (*)    AST 215 (*)    ALT 140 (*)    Total Bilirubin 3.8 (*)    GFR calc non Af Amer 46 (*)    GFR calc Af Amer 53 (*)    All other components within normal limits  CBC - Abnormal; Notable for the following:    WBC 15.7 (*)    All other components within normal limits  URINALYSIS, ROUTINE W REFLEX MICROSCOPIC (NOT AT [facility]) - Abnormal;  Notable for the following:    Color, Urine ORANGE (*)    Bilirubin Urine MODERATE (*)    Ketones, ur 15 (*)    Nitrite POSITIVE (*)    Leukocytes, UA SMALL (*)    All other components within normal limits  URINE MICROSCOPIC-ADD ON - Abnormal; Notable for the following:    Squamous Epithelial / LPF 0-5 (*)    Bacteria, UA RARE (*)    Casts HYALINE CASTS (*)    All other components within normal limits  LIPASE, BLOOD    Imaging Review No results found. I have personally reviewed and evaluated these images and lab results as part of my medical decision-making.   EKG Interpretation None      MDM   Final diagnoses:  LFT elevation  Total bilirubin, elevated  LUQ abdominal pain    70 yo M with epigastric and LUQ abdominal pain.  Concern for GI pathology will obtain CT scan w contrast.   Labs concerning for T bili elevation, lfts.  Patient continues to not have RUQ tenderness.  Turned over to Dr. Lita Mains.   The patients results and plan were reviewed and discussed.   Any x-rays performed were independently reviewed by myself.   Differential diagnosis were considered with the presenting HPI.  Medications  morphine 4 MG/ML injection 4 mg (not administered)  ondansetron (ZOFRAN) injection 4 mg (not administered)  alum & mag hydroxide-simeth (MAALOX/MYLANTA) 200-200-20 MG/5ML suspension 15 mL (15 mLs Oral Given 03/18/16 1359)  iopamidol (ISOVUE-300) 61 % injection (75 mLs  Contrast Given 03/18/16 1542)    Filed Vitals:   03/18/16 1430 03/18/16 1445 03/18/16 1500 03/18/16 1515  BP: 123/76 123/75 123/76 124/78  Pulse: 61 59 61 62  Temp:      TempSrc:      Resp: 18 18 18    Height:      Weight:  SpO2: 97% 96% 95% 96%    Final diagnoses:  LFT elevation  Total bilirubin, elevated  LUQ abdominal pain        Deno Etienne, DO 03/18/16 1551

## 2016-03-18 NOTE — Consult Note (Signed)
Reason for Consult:abdominal pain Referring Physician: [name] is an 70 y.o. male.  HPI:  Pt is a 70 yo M who presented to the ED with severe abdominal pain in the epigastric location.  The pain occurred last night and lasted for approximately 3 hours. When away mostly but came back today and did not go away. He came to the emergency department. He had nausea. He was febrile at home. At this point, the pain has resolved and the patient desires to go home. He was admitted to the hospital in January for an acute MI rule out. He does have a history of CABG as well as a defibrillator. In January he was seen to have some ST changes and was ruled out for MI. His pain was felt to be GI in origin. He was supposed to get a HIDA as an outpatient and never followed up.    In the emergency department, he was found to have an elevated bilirubin, AST and ALT. There was also question of gallbladder wall thickening. Due to his cardiac history and the lack of sensitivity for gallstones, he subsequently underwent an ultrasound. The ultrasound demonstrated sludge and mild gallbladder wall thickening. There was a negative sonographic Murphy's sign.   Past Medical History  Diagnosis Date  . Adult ADHD   . 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 Esophageal stricture   . Dyspepsia   . CAD (coronary artery disease)     Cath 2008, all grafts patent,   . HTN (hypertension)   . Hyperlipidemia   . GERD (gastroesophageal reflux disease)   . Thrombosis of arm     Left  . Elevated PSA 02/15/2011  . ICD (implantable cardiac defibrillator) battery depletion     Placed 2008  (eepisodes  nonsustained monomorphic VT in the past)  . Hx of CABG     1991  . Venous thrombosis     Left arm  . Carotid artery disease ([place])     Doppler, February, 2012, mild plaque, 0-39% bilateral, plan followup in  one year  . Prostate cancer ([place]) 2012    IMRT, ADT  . Ejection fraction < 50%   . Myocardial infarction ([place]) 1998  . AICD (automatic cardioverter/defibrillator) present     Past Surgical History  Procedure Laterality Date  . Coronary artery bypass graft      LIMA_LAD, SVG-OM, Cx, RCA '91  . Rotator cuff repair  2005, 2007  . Tonsillectomy  2005    Malignant lesion, w/XRT  . Cystectomy      Hand  . Icd placement    . Thumb surgery    . 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 Cardiac catheterization  12/05/06  . Menistectomies, left knee  '09  . Implantable cardioverter defibrillator (icd) generator change  04/06/2015    Procedure: Icd Generator Change;  Surgeon: Evans Lance, MD;  Location: [facility];  Service: Cardiovascular;;  . Cardiac catheterization N/A 12/21/2015    Procedure: Left Heart Cath and Coronary Angiography;  Surgeon: Lorretta Harp, MD;  Location: [facility];  Service: Cardiovascular;  Laterality: N/A;    Family History  Problem Relation Age of Onset  . Alzheimer's disease Mother   . Dementia Mother     living in  [place]  . Hyperlipidemia Father   . Hypertension Father   . CAD Father     had pacemaker  . Heart disease Brother     Bypass surgery  . Colon cancer Neg Hx   . Prostate cancer Neg Hx   . Diabetes Neg Hx   . Breast cancer Sister     survivor  . Congestive Heart Failure Father     Social History:  reports that he has never smoked. He has never used smokeless tobacco. He reports that he drinks about 14.4 oz of alcohol per week. He reports that he does not use illicit drugs.  Allergies:  Allergies  Allergen Reactions  . Other Anaphylaxis    Insect bites (Has EpiPen)    Medications:  amLODipine (NORVASC) 5 MG tablet    aspirin 81 MG EC tablet   atorvastatin (LIPITOR) 80 MG tablet   buPROPion (WELLBUTRIN XL) 300 MG 24 hr tablet   busPIRone (BUSPAR) 30 MG tablet    carvedilol (COREG) 25 MG tablet   EPINEPHrine (EPIPEN) 0.3 mg/0.3 mL DEVI   escitalopram (LEXAPRO) 20 MG tablet   esomeprazole (NEXIUM) 20 MG capsule   Multiple Vitamins-Minerals (MULTIVITAMIN,TX-MINERALS) tablet   Multiple Vitamins-Minerals (OCUVITE PRESERVISION) TABS   NON FORMULARY   pantoprazole (PROTONIX) 40 MG tablet   valsartan (DIOVAN) 160 MG tablet   zolpidem (AMBIEN) 10 MG tablet   nitroGLYCERIN (NITROSTAT) 0.4 MG SL tablet   ondansetron (ZOFRAN ODT) 4 MG disintegrating tablet   oxyCODONE (ROXICODONE) 5 MG immediate release tablet         Results for orders placed or performed during the hospital encounter of 03/18/16 (from the past 48 hour(s))  Urinalysis, Routine w reflex microscopic (not at [facility])     Status: Abnormal   Collection Time: 03/18/16 11:34 AM  Result Value Ref Range   Color, Urine ORANGE (A) YELLOW    Comment: BIOCHEMICALS MAY BE AFFECTED BY COLOR   APPearance CLEAR CLEAR   Specific Gravity, Urine 1.024 1.005 - 1.030   pH 8.0 5.0 - 8.0   Glucose, UA NEGATIVE NEGATIVE mg/dL   Hgb urine dipstick NEGATIVE NEGATIVE   Bilirubin Urine MODERATE (A) NEGATIVE   Ketones, ur 15 (A) NEGATIVE mg/dL   Protein, ur NEGATIVE NEGATIVE mg/dL   Nitrite POSITIVE (A) NEGATIVE   Leukocytes, UA SMALL (A) NEGATIVE  Urine microscopic-add on     Status: Abnormal   Collection Time: 03/18/16 11:34 AM  Result Value Ref Range   Squamous Epithelial / LPF 0-5 (A) NONE SEEN   WBC, UA 0-5 0 - 5 WBC/hpf   RBC / HPF 0-5 0 - 5 RBC/hpf   Bacteria, UA RARE (A) NONE SEEN   Casts HYALINE CASTS (A) NEGATIVE   Urine-Other MUCOUS PRESENT   Lipase, blood     Status: None   Collection Time: 03/18/16 11:36 AM  Result Value Ref Range   Lipase 25 11 - 51 U/L  Comprehensive metabolic panel     Status: Abnormal   Collection Time: 03/18/16 11:36 AM  Result Value Ref Range   Sodium 140 135 - 145 mmol/L   Potassium 4.3 3.5 - 5.1 mmol/L   Chloride 106 101 - 111 mmol/L   CO2 22 22 - 32  mmol/L   Glucose, Bld 136 (H) 65 - 99 mg/dL   BUN 18 6 - 20 mg/dL   Creatinine, Ser 9.83 (H) 0.61 - 1.24 mg/dL   Calcium 9.4 8.9 - 89.2 mg/dL   Total Protein 6.6 6.5 -  8.1 g/dL   Albumin 3.6 3.5 - 5.0 g/dL   AST 215 (H) 15 - 41 U/L   ALT 140 (H) 17 - 63 U/L   Alkaline Phosphatase 126 38 - 126 U/L   Total Bilirubin 3.8 (H) 0.3 - 1.2 mg/dL   GFR calc non Af Amer 46 (L) >60 mL/min   GFR calc Af Amer 53 (L) >60 mL/min    Comment: (NOTE) The eGFR has been calculated using the CKD EPI equation. This calculation has not been validated in all clinical situations. eGFR's persistently <60 mL/min signify possible Chronic Kidney Disease.    Anion gap 12 5 - 15  CBC     Status: Abnormal   Collection Time: 03/18/16 11:36 AM  Result Value Ref Range   WBC 15.7 (H) 4.0 - 10.5 K/uL   RBC 4.74 4.22 - 5.81 MIL/uL   Hemoglobin 13.5 13.0 - 17.0 g/dL   HCT 41.3 39.0 - 52.0 %   MCV 87.1 78.0 - 100.0 fL   MCH 28.5 26.0 - 34.0 pg   MCHC 32.7 30.0 - 36.0 g/dL   RDW 15.4 11.5 - 15.5 %   Platelets 159 150 - 400 K/uL    Ct Abdomen Pelvis W Contrast  03/18/2016  CLINICAL DATA:  Left upper quadrant abdominal pain beginning yesterday. Personal history of thyroid and prostate carcinoma. EXAM: CT ABDOMEN AND PELVIS WITH CONTRAST TECHNIQUE: Multidetector CT imaging of the abdomen and pelvis was performed using the standard protocol following bolus administration of intravenous contrast. CONTRAST:  75m ISOVUE-300 IOPAMIDOL (ISOVUE-300) INJECTION 61% COMPARISON:  12/21/2015 FINDINGS: Lower chest: No acute findings. Stable posterior right lower lobe scarring. Stable cardiomegaly and coronary artery calcification. Stable moderate size hiatal hernia. Hepatobiliary: No masses identified. There is mild diffuse gallbladder wall thickening and mucosal enhancement without dilatation. This is similar in appearance to previous study. Pancreas: No mass, inflammatory changes, or other significant abnormality. Spleen: Within  normal limits in size and appearance. Adrenals/Urinary Tract: No masses identified. Tiny right renal cyst again noted. No evidence of hydronephrosis. Unopacified urinary bladder is unremarkable in appearance. Stomach/Bowel: No evidence of obstruction, inflammatory process, or abnormal fluid collections. Diverticulosis again seen involving the descending and sigmoid colon, however there is no evidence of diverticulitis. Vascular/Lymphatic: No pathologically enlarged lymph nodes. No evidence of abdominal aortic aneurysm. Aortic atherosclerotic plaque noted. Reproductive: Fiducial markers seen in the prostate gland which is normal in size. Symmetric seminal vesicles. Other: None. Musculoskeletal:  No suspicious bone lesions identified. IMPRESSION: No acute findings identified within the abdomen or pelvis. Stable mild gallbladder wall thickening, without dilatation or acute pericholecystic inflammatory changes. These findings may be secondary to hepatocellular disease or chronic cholecystitis. Consider nuclear medicine HIDA scan with gallbladder ejection fraction for further evaluation if clinically warranted. Stable moderate hiatal hernia. Colonic diverticulosis. No radiographic evidence of diverticulitis. Electronically Signed   By: JEarle GellM.D.   On: 03/18/2016 16:15   [place]dom[place] Limited  03/18/2016  CLINICAL DATA:  Upper abdominal pain for 1 day with elevated liver function tests. EXAM: [place]DOM[place] LIMITED - RIGHT UPPER QUADRANT COMPARISON:  03/18/2016 CT scan FINDINGS: Gallbladder: Abnormal gallbladder wall thickening up to 5 mm with sludge in the gallbladder but no visible gallstones. Sonographic Murphy's sign absent. No pericholecystic fluid. Common bile duct: Diameter: 4 mm, within normal limits. Liver: No focal lesion or intrahepatic biliary dilatation. Mildly echogenic liver could reflect mild hepatic steatosis. IMPRESSION: 1. Abnormal gallbladder wall thickening up to 5 mm. There is sludge in the  gallbladder. Sonographic Murphy's sign absent, no para cholecystic fluid. Correlate clinically in assessing for acute cholecystitis. 2. No biliary dilatation or focal liver lesion. Faintly echogenic liver could reflect mild hepatic steatosis. Electronically Signed   By: Van Clines M.D.   On: 03/18/2016 19:36    Review of Systems  Constitutional: Positive for fever.  Eyes: Negative.   Respiratory: Negative.   Cardiovascular: Negative.   Gastrointestinal: Positive for nausea and abdominal pain.  Genitourinary: Negative.   Musculoskeletal: Positive for back pain.  Neurological: Positive for headaches.  Endo/Heme/Allergies: Negative.   Psychiatric/Behavioral: Negative.   All other systems reviewed and are negative.  Blood pressure 140/78, pulse 61, temperature 99.5 F (37.5 C), temperature source Oral, resp. rate 17, height 6' (1.829 m), weight 90.719 kg (200 lb), SpO2 95 %. Physical Exam  Constitutional: He is oriented to person, place, and time. He appears well-developed and well-nourished. No distress.  HENT:  Head: Normocephalic and atraumatic.  Eyes: Conjunctivae are normal. Pupils are equal, round, and reactive to light. No scleral icterus.  Neck: Normal range of motion. No thyromegaly present.  Cardiovascular: Normal rate, regular rhythm and normal heart sounds.   Respiratory: Effort normal and breath sounds normal. No respiratory distress.  GI: Soft. He exhibits no distension and no mass. There is no tenderness. There is no rebound and no guarding.  Neurological: He is alert and oriented to person, place, and time. Coordination normal.  Skin: Skin is warm and dry. No rash noted. He is not diaphoretic. No erythema. No pallor.  Psychiatric: He has a normal mood and affect. His behavior is normal. Judgment and thought content normal.    Assessment/Plan: Hyperbilirubinemia Leukocytosis Gallbladder wall thickening.    I recommended that the patient stay overnight and get  HIDA scan to make sure he had no evidence of cholecystitis.   He does not have tenderness at this point, but he was tender earlier.  In the setting of leukocytosis and low grade fever, I think it is possible that the sludge and stones are the cause of his pain.  I am also concerned about the elevated liver function tests.    Despite discussions with Dr. Lita Mains and myself, the patient did not want to stay in the hospital.  He signed out AMA.    [name] 03/18/2016, 10:42 PM

## 2016-03-18 NOTE — Telephone Encounter (Signed)
Please advise 

## 2016-03-18 NOTE — ED Notes (Addendum)
Pt states LUQ pain that radiates to L flank since last night.  States hx of pancreatitis and this feels the same.  Denies changes in bowel or bladder habits.  Temp of 101.4 this am.  Pt states drank a few beers last night which is his norm.

## 2016-03-18 NOTE — ED Notes (Signed)
Pt requests to speak with Dr.yelverton about [place]. MD made aware.

## 2016-03-19 ENCOUNTER — Encounter: Payer: Self-pay | Admitting: Internal Medicine

## 2016-03-19 DIAGNOSIS — R1013 Epigastric pain: Secondary | ICD-10-CM | POA: Diagnosis not present

## 2016-03-23 ENCOUNTER — Ambulatory Visit (INDEPENDENT_AMBULATORY_CARE_PROVIDER_SITE_OTHER): Payer: Medicare Other | Admitting: Internal Medicine

## 2016-03-23 ENCOUNTER — Other Ambulatory Visit (INDEPENDENT_AMBULATORY_CARE_PROVIDER_SITE_OTHER): Payer: Medicare Other

## 2016-03-23 ENCOUNTER — Encounter: Payer: Self-pay | Admitting: Internal Medicine

## 2016-03-23 VITALS — BP 120/88 | HR 54 | Temp 98.8°F | Ht 72.0 in | Wt 199.0 lb

## 2016-03-23 DIAGNOSIS — Q441 Other congenital malformations of gallbladder: Secondary | ICD-10-CM

## 2016-03-23 DIAGNOSIS — R7989 Other specified abnormal findings of blood chemistry: Secondary | ICD-10-CM

## 2016-03-23 DIAGNOSIS — R945 Abnormal results of liver function studies: Secondary | ICD-10-CM

## 2016-03-23 DIAGNOSIS — K811 Chronic cholecystitis: Secondary | ICD-10-CM | POA: Diagnosis not present

## 2016-03-23 LAB — COMPREHENSIVE METABOLIC PANEL
ALK PHOS: 119 U/L — AB (ref 39–117)
ALT: 74 U/L — AB (ref 0–53)
AST: 57 U/L — ABNORMAL HIGH (ref 0–37)
Albumin: 4.1 g/dL (ref 3.5–5.2)
BUN: 15 mg/dL (ref 6–23)
CALCIUM: 9.5 mg/dL (ref 8.4–10.5)
CO2: 29 mEq/L (ref 19–32)
Chloride: 104 mEq/L (ref 96–112)
Creatinine, Ser: 1.27 mg/dL (ref 0.40–1.50)
GFR: 59.88 mL/min — AB (ref >60.00)
GLUCOSE: 105 mg/dL — AB (ref 70–99)
POTASSIUM: 4.3 meq/L (ref 3.5–5.1)
Sodium: 141 mEq/L (ref 135–145)
TOTAL PROTEIN: 7.1 g/dL (ref 6.0–8.3)
Total Bilirubin: 1.3 mg/dL — ABNORMAL HIGH (ref 0.2–1.2)

## 2016-03-23 LAB — BILIRUBIN, DIRECT: Bilirubin, Direct: 0.4 mg/dL — ABNORMAL HIGH (ref 0.0–0.3)

## 2016-03-23 NOTE — Progress Notes (Signed)
Pre visit review using our clinic review tool, if applicable. No additional management support is needed unless otherwise documented below in the visit note. 

## 2016-03-23 NOTE — Patient Instructions (Signed)
We are checking some blood work today and will send the results to [facility] and call you with them.   We are getting you in with GI hopefully this week.   Work on low fat and no fried foods.   Low-Fat Diet for Pancreatitis or Gallbladder Conditions A low-fat diet can be helpful if you have pancreatitis or a gallbladder condition. With these conditions, your pancreas and gallbladder have trouble digesting fats. A healthy eating plan with less fat will help rest your pancreas and gallbladder and reduce your symptoms. WHAT DO I NEED TO KNOW ABOUT THIS DIET?  Eat a low-fat diet.  Reduce your fat intake to less than 20-30% of your total daily calories. This is less than 50-60 g of fat per day.  Remember that you need some fat in your diet. Ask your dietician what your daily goal should be.  Choose nonfat and low-fat healthy foods. Look for the words "nonfat," "low fat," or "fat free."  As a guide, look on the label and choose foods with less than 3 g of fat per serving. Eat only one serving.  Avoid alcohol.  Do not smoke. If you need help quitting, talk with your health care provider.  Eat small frequent meals instead of three large heavy meals. WHAT FOODS CAN I EAT? Grains Include healthy grains and starches such as potatoes, wheat bread, fiber-rich cereal, and brown rice. Choose whole grain options whenever possible. In adults, whole grains should account for 45-65% of your daily calories.  Fruits and Vegetables Eat plenty of fruits and vegetables. Fresh fruits and vegetables add fiber to your diet. Meats and Other Protein Sources Eat lean meat such as chicken and pork. Trim any fat off of meat before cooking it. Eggs, fish, and beans are other sources of protein. In adults, these foods should account for 10-35% of your daily calories. Dairy Choose low-fat milk and dairy options. Dairy includes fat and protein, as well as calcium.  Fats and Oils Limit high-fat foods such as fried  foods, sweets, baked goods, sugary drinks.  Other Creamy sauces and condiments, such as mayonnaise, can add extra fat. Think about whether or not you need to use them, or use smaller amounts or low fat options. WHAT FOODS ARE NOT RECOMMENDED?  High fat foods, such as:  [employer].  Ice cream.  [place] toast.  Sweet rolls.  Pizza.  Cheese bread.  Foods covered with batter, butter, creamy sauces, or cheese.  Fried foods.  Sugary drinks and desserts.  Foods that cause gas or bloating   This information is not intended to replace advice given to you by your health care provider. Make sure you discuss any questions you have with your health care provider.   Document Released: 11/19/2013 Document Reviewed: 11/19/2013 Elsevier Interactive Patient Education [employer].

## 2016-03-24 DIAGNOSIS — R7989 Other specified abnormal findings of blood chemistry: Secondary | ICD-10-CM | POA: Insufficient documentation

## 2016-03-24 DIAGNOSIS — R945 Abnormal results of liver function studies: Principal | ICD-10-CM

## 2016-03-24 LAB — ACUTE HEP PANEL AND HEP B SURFACE AB
HCV AB: NEGATIVE
HEP B C IGM: NONREACTIVE
HEP B S AB: NEGATIVE
Hep A IgM: NONREACTIVE
Hepatitis B Surface Ag: NEGATIVE

## 2016-03-24 NOTE — Progress Notes (Signed)
   Subjective:    Patient ID: [name], male    DOB: 08-Jan-1946, 70 y.o.   MRN: [id number]  HPI The patient is a 70 YO man coming in for pain in his stomach. Not having it today but had flare recently. Pain is severe and left sided with radiation to the back. First flare in January and they did [place] and CT abdomen with thickening of the gallbladder. Is on protonix and taking nexium as well over the counter which does not help. In the midst of flare he tried tums which did not help at all. In the hospital they did again CT and [place] which still showed some gallbladder wall thickening and sludge. He is concerned about this as the ER doctor was trying to get him to have his gallbladder out and brought in a surgeon. Previously that same visit the ER doctor was telling him it was his stomach and he needed an EGD and he is just confused. His LFTs were up during that visit and he was not given any information about that. No fevers or chills. No weight change. Is eating and drinking okay but cautiously.   Review of Systems  Constitutional: Negative for fever, chills, activity change, appetite change, fatigue and unexpected weight change.  HENT: Negative.   Eyes: Negative.   Respiratory: Negative for cough, chest tightness, shortness of breath and wheezing.   Cardiovascular: Negative for chest pain, palpitations and leg swelling.  Gastrointestinal: Positive for abdominal pain. Negative for nausea, vomiting, diarrhea, constipation, blood in stool and abdominal distention.  Endocrine: Negative.   Musculoskeletal: Negative.   Skin: Negative.   Neurological: Negative.   Psychiatric/Behavioral: Negative.       Objective:   Physical Exam  Constitutional: He is oriented to person, place, and time. He appears well-developed and well-nourished.  HENT:  Head: Normocephalic and atraumatic.  Eyes: EOM are normal.  Neck: Normal range of motion.  Cardiovascular: Normal rate.   Pulmonary/Chest: Effort  normal and breath sounds normal.  Abdominal: Soft. Bowel sounds are normal. He exhibits no distension and no mass. There is no tenderness. There is no rebound and no guarding.  Musculoskeletal: He exhibits no edema.  Neurological: He is alert and oriented to person, place, and time. Coordination normal.  Skin: Skin is warm and dry.  Psychiatric: He has a normal mood and affect.   Filed Vitals:   03/23/16 0810  BP: 120/88  Pulse: 54  Temp: 98.8 F (37.1 C)  TempSrc: Oral  Height: 6' (1.829 m)  Weight: 199 lb (90.266 kg)  SpO2: 96%      Assessment & Plan:

## 2016-03-24 NOTE — Assessment & Plan Note (Signed)
There is some concern still that this could be coming from a gallbladder process. Rechecking the LFTs today as well as direct and indirect bilirubin and hepatitis panel. He is not having pain today and will urgently refer to GI for consideration of HIDA scan. There was some mention of chronic cholecystitis on the [place] which was not changed from January. No fevers, chills, abdominal pain, weight change to suggest acute process.

## 2016-03-25 ENCOUNTER — Encounter: Payer: Self-pay | Admitting: Gastroenterology

## 2016-03-25 ENCOUNTER — Ambulatory Visit (INDEPENDENT_AMBULATORY_CARE_PROVIDER_SITE_OTHER): Payer: Medicare Other | Admitting: Gastroenterology

## 2016-03-25 VITALS — BP 98/60 | HR 58 | Ht 72.0 in | Wt 197.0 lb

## 2016-03-25 DIAGNOSIS — R945 Abnormal results of liver function studies: Secondary | ICD-10-CM

## 2016-03-25 DIAGNOSIS — R1012 Left upper quadrant pain: Secondary | ICD-10-CM

## 2016-03-25 DIAGNOSIS — I255 Ischemic cardiomyopathy: Secondary | ICD-10-CM | POA: Diagnosis not present

## 2016-03-25 DIAGNOSIS — R7989 Other specified abnormal findings of blood chemistry: Secondary | ICD-10-CM

## 2016-03-25 NOTE — Progress Notes (Signed)
[facility] Consult Note:  History: [name] 03/25/2016  Referring physician: Hoyt Koch, MD  Reason for consult/chief complaint: Abdominal Pain   Subjective HPI:  [name] was referred for recent left upper quadrant pain. He reports an episode 3 months ago that brought him to the local EGD. There was another episode a week ago today, also bringing him to the ED. Both of them have been pain that started in the back and then migrated to the left upper quadrant. He recalls no injury, there were no other clear triggers. There were no associated symptoms such as nausea vomiting fever altered bowel habits or rectal bleeding. Both episodes lasted a few hours and then resolved. During the recent visit, he was found to have gallbladder wall thickening and elevated LFTs. He was advised to be admitted for observation and workup after the ED physician discussed the case with the surgeon on-call, but he left AMA.   ROS:  Review of Systems  Constitutional: Negative for appetite change and unexpected weight change.  HENT: Positive for trouble swallowing. Negative for mouth sores and voice change.   Eyes: Negative for pain and redness.  Respiratory: Negative for cough and shortness of breath.   Cardiovascular: Negative for chest pain and palpitations.  Genitourinary: Negative for dysuria and hematuria.  Musculoskeletal: Negative for myalgias and arthralgias.  Skin: Negative for pallor and rash.  Neurological: Negative for weakness and headaches.  Hematological: Negative for adenopathy.  Psychiatric/Behavioral: Positive for dysphoric mood.   Some dysphagia since tonsillar CA XRT  Past Medical History: Past Medical History  Diagnosis Date  . Adult ADHD   . 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 Esophageal stricture   . Dyspepsia   . CAD  (coronary artery disease)     Cath 2008, all grafts patent,   . HTN (hypertension)   . Hyperlipidemia   . GERD (gastroesophageal reflux disease)   . Thrombosis of arm     Left  . Elevated PSA 02/15/2011  . ICD (implantable cardiac defibrillator) battery depletion     Placed 2008  (eepisodes  nonsustained monomorphic VT in the past)  . Hx of CABG     1991  . Venous thrombosis     Left arm  . Carotid artery disease ([place])     Doppler, February, 2012, mild plaque, 0-39% bilateral, plan followup in one year  . Prostate cancer ([place]) 2012    IMRT, ADT  . Ejection fraction < 50%   . Myocardial infarction ([place]) 1998  . AICD (automatic cardioverter/defibrillator) present      Past Surgical History: Past Surgical History  Procedure Laterality Date  . Coronary artery bypass graft      LIMA_LAD, SVG-OM, Cx, RCA '91  . Rotator cuff repair  2005, 2007  . Tonsillectomy  2005    Malignant lesion, w/XRT  . Cystectomy      Hand  . Icd placement    . Thumb surgery    . 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 Cardiac catheterization  12/05/06  . Menistectomies, left knee  '09  . Implantable cardioverter defibrillator (icd) generator change  04/06/2015    Procedure: Icd Generator Change;  Surgeon: Evans Lance, MD;  Location: [facility];  Service: Cardiovascular;;  . Cardiac catheterization N/A  12/21/2015    Procedure: Left Heart Cath and Coronary Angiography;  Surgeon: Lorretta Harp, MD;  Location: [facility];  Service: Cardiovascular;  Laterality: N/A;     Family History: Family History  Problem Relation Age of Onset  . Alzheimer's disease Mother   . Dementia Mother     living in [place]  . Hyperlipidemia Father   . Hypertension Father   . CAD Father     had pacemaker  . Heart disease Brother     Bypass surgery  . Colon cancer Neg Hx   . Prostate cancer Neg Hx   . Diabetes Neg Hx   . Breast cancer Sister      survivor  . Congestive Heart Failure Father     Social History: Social History   Social History  . Marital Status: Married    Spouse Name: N/A  . Number of Children: 0  . Years of Education: N/A   Occupational History  . 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 Smoking status: Never Smoker   . Smokeless tobacco: Never Used  . Alcohol Use: 14.4 oz/week    24 Cans of beer per week  . Drug Use: No  . Sexual Activity: Yes    Birth Control/ Protection: None   Other Topics Concern  . None   Social History Narrative   [employer]. 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 Marriage in good health, wife in good health. ACP discussed Jan '15: no CPR, no mechanical ventilation. He is referred to [email or web address] and provided with packet with HCPOA-his wife./ Advanced directive.    Allergies: Allergies  Allergen Reactions  . Other Anaphylaxis    Insect bites (Has EpiPen)    Outpatient Meds: Current Outpatient Prescriptions  Medication Sig Dispense Refill  . amLODipine (NORVASC) 5 MG tablet TAKE 1 TABLET BY MOUTH EVERY DAY 90 tablet 1  . aspirin 81 MG EC tablet Take 81 mg by mouth daily.      Marland Kitchen atorvastatin (LIPITOR) 80 MG tablet TAKE 1 TABLET BY MOUTH EVERY DAY 90 tablet 2  . buPROPion (WELLBUTRIN XL) 300 MG 24 hr tablet Take 300 mg by mouth daily.    . busPIRone (BUSPAR) 30 MG tablet Take 20 mg by mouth daily.     . carvedilol (COREG) 25 MG tablet TAKE 1 TABLET BY MOUTH TWICE A DAY 180 tablet 3  . EPINEPHrine (EPIPEN) 0.3 mg/0.3 mL DEVI Inject 0.3 mLs (0.3 mg total) into the muscle as needed. 2 Device 0  . escitalopram (LEXAPRO) 20 MG tablet Take 20 mg by mouth daily.      Marland Kitchen esomeprazole (NEXIUM) 20 MG capsule Take 20 mg by mouth daily at 12 noon.    . Multiple Vitamins-Minerals (MULTIVITAMIN,TX-MINERALS) tablet Take 1 tablet by mouth daily.      . Multiple Vitamins-Minerals (OCUVITE PRESERVISION) TABS Take 1 tablet by mouth  daily.     . nitroGLYCERIN (NITROSTAT) 0.4 MG SL tablet Place 1 tablet (0.4 mg total) under the tongue every 5 (five) minutes as needed for chest pain. 25 tablet 12  . NON FORMULARY Joint Juice: 8 oz daily     . ondansetron (ZOFRAN ODT) 4 MG disintegrating tablet 4mg  ODT q4 hours prn nausea/vomit 8 tablet 0  . oxyCODONE (ROXICODONE) 5 MG immediate release tablet Take 1 tablet (5 mg total) by mouth every 4 (four) hours as needed for moderate pain or severe pain.  10 tablet 0  . pantoprazole (PROTONIX) 40 MG tablet TAKE 1 TABLET BY MOUTH EVERY DAY 90 tablet 3  . valsartan (DIOVAN) 160 MG tablet TAKE 1 TABLET BY MOUTH TWICE A DAY 180 tablet 1  . zolpidem (AMBIEN) 10 MG tablet Take 1 tablet by mouth at bedtime as needed for sleep.      No current facility-administered medications for this visit.      ___________________________________________________________________ Objective  Exam:  BP 98/60 mmHg  Pulse 58  Ht 6' (1.829 m)  Wt 197 lb (89.359 kg)  BMI 26.71 kg/m2   General: this is a(n) Well-appearing elderly man with a somewhat restricted affect.   Eyes: sclera anicteric, no redness  ENT: oral mucosa moist without lesions, no cervical or supraclavicular lymphadenopathy, good dentition  CV: RRR without murmur, S1/S2, no JVD, no peripheral edema. AICD left upper chest wall  Resp: clear to auscultation bilaterally, normal RR and effort noted  GI: soft, no tenderness, with active bowel sounds. No guarding or palpable organomegaly noted.  Skin; warm and dry, no rash or jaundice noted  Neuro: awake, alert and oriented x 3. Normal gross motor function and fluent speech  Labs:   Lab Results  Component Value Date   WBC 15.7* 03/18/2016   HGB 13.5 03/18/2016   HCT 41.3 03/18/2016   MCV 87.1 03/18/2016   PLT 159 03/18/2016    CMP Latest Ref Rng 03/23/2016 03/18/2016 12/30/2015  Glucose 70 - 99 mg/dL 105(H) 136(H) 101(H)  BUN 6 - 23 mg/dL 15 18 16   Creatinine 0.40 - 1.50 mg/dL  1.27 1.50(H) 1.32(H)  Sodium 135 - 145 mEq/L 141 140 140  Potassium 3.5 - 5.1 mEq/L 4.3 4.3 3.6  Chloride 96 - 112 mEq/L 104 106 106  CO2 19 - 32 mEq/L 29 22 22   Calcium 8.4 - 10.5 mg/dL 9.5 9.4 9.1  Total Protein 6.0 - 8.3 g/dL 7.1 6.6 6.9  Total Bilirubin 0.2 - 1.2 mg/dL 1.3(H) 3.8(H) 1.0  Alkaline Phos 39 - 117 U/L 119(H) 126 111  AST 0 - 37 U/L 57(H) 215(H) 44(H)  ALT 0 - 53 U/L 74(H) 140(H) 51(H)   Lipase nml   Radiologic Studies: Cardiac echo 07/2015: LVEF 2-25%, RA and RV dilated RUQ [place]: GB wall thickening 43mm, sludge, no bil dil CT angio 1/2017_ ? GB thickening, otherwise normal study. Assessment: Encounter Diagnoses  Name Primary?  . LUQ pain Yes  . Elevated LFTs   . Cardiomyopathy, ischemic     2 episodes of this pain that is somewhat difficult to characterize. There are no associated symptoms, and he has felt well between the episodes. The elevated LFTs on this occasion appear clearly related to excess alcohol intake with a classic AST predominant transaminitis. The alkaline phosphatase was normal despite the elevated bilirubin, speaking against biliary obstruction. There was also no biliary ductal dilatation on ultrasound. Although a similar pattern could be seen with congestive hepatopathy, he did not reportedly presented with decompensated heart failure during this recent ED visit, and his LFTs were relatively normal on 12/30/2015.  Plan:  Decrease alcohol intake, and monitor for further episodes of this pain. I do not think an endoscopic workup will be fruitful in this scenario, and he is at increased risk for cardiopulmonary complications of sedation due to his cardiac condition.  Thank you for the courtesy of this consult.  Please call me with any questions or concerns.  Total 45 minutes time, more than half spent reviewing his records and counseling regarding  alcohol use. Nelida Meuse III

## 2016-03-25 NOTE — Patient Instructions (Addendum)
Please decrease your alcohol consumption, because I think it will improve this condition and your overall health.  If you are age 69 or older, your body mass index should be between 23-30. Your Body mass index is 26.71 kg/(m^2). If this is out of the aforementioned range listed, please consider follow up with your Primary Care Provider.  If you are age 28 or younger, your body mass index should be between 19-25. Your Body mass index is 26.71 kg/(m^2). If this is out of the aformentioned range listed, please consider follow up with your Primary Care Provider.   Thank you for choosing [facility]  Dr Wilfrid Lund III

## 2016-04-26 ENCOUNTER — Ambulatory Visit (INDEPENDENT_AMBULATORY_CARE_PROVIDER_SITE_OTHER): Payer: Medicare Other | Admitting: *Deleted

## 2016-04-26 DIAGNOSIS — I255 Ischemic cardiomyopathy: Secondary | ICD-10-CM

## 2016-04-27 NOTE — Progress Notes (Signed)
Remote ICD transmission.   

## 2016-05-04 ENCOUNTER — Telehealth: Payer: Self-pay

## 2016-05-04 NOTE — Telephone Encounter (Signed)
Received a fax from [facility] in the [employer]. Fax stated that the dx code for hepatitis panel is not covered.  Place note on provider desk to review.

## 2016-05-10 ENCOUNTER — Encounter: Payer: Self-pay | Admitting: Internal Medicine

## 2016-05-10 ENCOUNTER — Ambulatory Visit (INDEPENDENT_AMBULATORY_CARE_PROVIDER_SITE_OTHER): Payer: Medicare Other | Admitting: Internal Medicine

## 2016-05-10 VITALS — BP 92/60 | HR 66 | Temp 98.5°F | Resp 16 | Ht 72.0 in | Wt 193.0 lb

## 2016-05-10 DIAGNOSIS — I951 Orthostatic hypotension: Secondary | ICD-10-CM | POA: Diagnosis not present

## 2016-05-10 LAB — CUP PACEART REMOTE DEVICE CHECK
Brady Statistic RV Percent Paced: 2.84 %
Date Time Interrogation Session: 20170530160833
HIGH POWER IMPEDANCE MEASURED VALUE: 42 Ohm
HighPow Impedance: 53 Ohm
Implantable Lead Model: 6947
Lead Channel Impedance Value: 342 Ohm
Lead Channel Impedance Value: 418 Ohm
Lead Channel Setting Pacing Amplitude: 2 V
Lead Channel Setting Pacing Pulse Width: 0.4 ms
MDC IDC LEAD IMPLANT DT: 20080108
MDC IDC LEAD LOCATION: 753860
MDC IDC MSMT BATTERY REMAINING LONGEVITY: 132 mo
MDC IDC MSMT BATTERY VOLTAGE: 3.01 V
MDC IDC MSMT LEADCHNL RV PACING THRESHOLD AMPLITUDE: 0.75 V
MDC IDC MSMT LEADCHNL RV PACING THRESHOLD PULSEWIDTH: 0.4 ms
MDC IDC MSMT LEADCHNL RV SENSING INTR AMPL: 20.125 mV
MDC IDC MSMT LEADCHNL RV SENSING INTR AMPL: 20.125 mV
MDC IDC SET LEADCHNL RV SENSING SENSITIVITY: 0.3 mV

## 2016-05-10 NOTE — Patient Instructions (Signed)
Stop taking the amlodipine.   With the valsartan take 1/2 pill in the morning. Do not take it in the evening.   Work on drinking some more fluids and eating an extra snack if you are not able to eat as much at meals.   You do need to try to do some exercise or activity at home to help keep the energy up.

## 2016-05-10 NOTE — Progress Notes (Signed)
Pre visit review using our clinic review tool, if applicable. No additional management support is needed unless otherwise documented below in the visit note. 

## 2016-05-11 ENCOUNTER — Encounter: Payer: Self-pay | Admitting: Internal Medicine

## 2016-05-11 DIAGNOSIS — I951 Orthostatic hypotension: Secondary | ICD-10-CM | POA: Insufficient documentation

## 2016-05-11 NOTE — Assessment & Plan Note (Signed)
He has cut back on the valsartan to 1 pill in the morning only. We will cut back further to 1/2 pill in the morning and stop his amlodipine. Likely his stopping alcohol has naturally decreased his BP and he is now having low BP and no energy from low BP. If no improvement with med change will get labs looking for additional cause. Encouraged to drink more fluids and eat better. Encouraged to start mild exercise to help with sleeping most of the day.

## 2016-05-11 NOTE — Progress Notes (Signed)
   Subjective:    Patient ID: [name], male    DOB: 21-Apr-1946, 70 y.o.   MRN: [id number]  HPI The patient is a 70 YO man coming in for feeling dizzy and weak lately at home. His blood pressure has been running low. This has been going on for the last 4-6 weeks. He saw the GI doctor and they encouraged him to give up alcohol which he did. He was drinking 3-4 beers per night and now is drinking 0. He has lost about 5 pounds since that time. Appetite is less and not drinking a lot of fluids. No headaches, fevers, chills. No SOB and no swelling.   Review of Systems  Constitutional: Negative for fever, chills, activity change, appetite change, fatigue and unexpected weight change.  HENT: Negative.   Eyes: Negative.   Respiratory: Negative for cough, chest tightness, shortness of breath and wheezing.   Cardiovascular: Negative for chest pain, palpitations and leg swelling.  Gastrointestinal: Negative for nausea, vomiting, abdominal pain, diarrhea, constipation, blood in stool and abdominal distention.  Musculoskeletal: Negative.   Skin: Negative.   Neurological: Positive for dizziness, weakness and light-headedness.  Psychiatric/Behavioral: Negative.       Objective:   Physical Exam  Constitutional: He is oriented to person, place, and time. He appears well-developed and well-nourished.  Dizzy with standing  HENT:  Head: Normocephalic and atraumatic.  Eyes: EOM are normal.  Neck: Normal range of motion.  Cardiovascular: Normal rate.   Pulmonary/Chest: Effort normal and breath sounds normal.  Abdominal: Soft. Bowel sounds are normal. He exhibits no distension and no mass. There is no tenderness. There is no rebound and no guarding.  Musculoskeletal: He exhibits no edema.  Neurological: He is alert and oriented to person, place, and time. Coordination normal.  Skin: Skin is warm and dry.  Psychiatric: He has a normal mood and affect.   Filed Vitals:   05/10/16 1619 05/10/16  1621 05/10/16 1623 05/10/16 1624  BP:  118/80 100/82 92/60  Pulse:  57 58 66  Temp: 98.5 F (36.9 C)     TempSrc: Oral     Resp: 16     Height: 6' (1.829 m)     Weight: 193 lb (87.544 kg)     SpO2: 97%         Assessment & Plan:

## 2016-05-14 ENCOUNTER — Encounter: Payer: Self-pay | Admitting: Internal Medicine

## 2016-05-17 ENCOUNTER — Encounter: Payer: Self-pay | Admitting: Cardiology

## 2016-05-17 DIAGNOSIS — F900 Attention-deficit hyperactivity disorder, predominantly inattentive type: Secondary | ICD-10-CM | POA: Diagnosis not present

## 2016-05-17 DIAGNOSIS — F331 Major depressive disorder, recurrent, moderate: Secondary | ICD-10-CM | POA: Diagnosis not present

## 2016-05-17 DIAGNOSIS — F422 Mixed obsessional thoughts and acts: Secondary | ICD-10-CM | POA: Diagnosis not present

## 2016-05-17 DIAGNOSIS — F411 Generalized anxiety disorder: Secondary | ICD-10-CM | POA: Diagnosis not present

## 2016-06-01 ENCOUNTER — Encounter: Payer: Self-pay | Admitting: Cardiovascular Disease

## 2016-06-01 ENCOUNTER — Ambulatory Visit (INDEPENDENT_AMBULATORY_CARE_PROVIDER_SITE_OTHER): Payer: Medicare Other | Admitting: Cardiovascular Disease

## 2016-06-01 VITALS — BP 122/90 | HR 56 | Ht 72.0 in | Wt 194.0 lb

## 2016-06-01 DIAGNOSIS — I951 Orthostatic hypotension: Secondary | ICD-10-CM

## 2016-06-01 DIAGNOSIS — I5022 Chronic systolic (congestive) heart failure: Secondary | ICD-10-CM

## 2016-06-01 DIAGNOSIS — I2510 Atherosclerotic heart disease of native coronary artery without angina pectoris: Secondary | ICD-10-CM | POA: Diagnosis not present

## 2016-06-01 DIAGNOSIS — E785 Hyperlipidemia, unspecified: Secondary | ICD-10-CM | POA: Diagnosis not present

## 2016-06-01 DIAGNOSIS — I2583 Coronary atherosclerosis due to lipid rich plaque: Principal | ICD-10-CM

## 2016-06-01 MED ORDER — CARVEDILOL 12.5 MG PO TABS: 12.5000 mg | ORAL_TABLET | Freq: Two times a day (BID) | ORAL | Status: DC

## 2016-06-01 NOTE — Progress Notes (Signed)
Cardiology Office Note   Date:  06/01/2016   ID:  [name], DOB 1946/06/30, MRN [id number]  PCP:  Hoyt Koch, MD  Cardiologist:  Mertie Moores, MD , previous Ron Parker patient   Chief Complaint  Patient presents with  . Follow-up   Problem List 1. CAD 2. Chronic systolic CHF 3. Essential HTN 4. ICD  5. Hyperlipidemia    History of Present Illness: [name] is a 70 y.o. male who presents today to follow-up coronary disease and ischemic cardiomyopathy and hypertension. He recently had an ICD generator change. He has seen Dr. Lovena Le in follow-up. He has significant left ventricular dysfunction. He has not had an echo for many many years. He has been clinically stable. He is on the medications for CHF that he has been able to tolerate in the past. Because of complicated medicines for his adult ADHD, I have chosen not to push his medicines any further over time. Of course this can always be reconsidered going forward.  He's done very well over the years and he continues to do well. He is not having any chest pain or shortness of breath. When his ICD has been interrogated, he has not had any recurring ventricular tachycardia.  June 01, 2016:  [name] is seen today for follow up  Brought some BP readings.  Has been low at times.   His symptoms of orthostatic hypotension. These low blood pressure readings typically occur at midday. He takes his pressure pills at 10 AM. His primary medical doctor has reduced his Diovan dose from 160 mg to 80 mg a day. His symptoms have improved since that time.   Past Medical History  Diagnosis Date  . Adult ADHD   . 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 Esophageal stricture   . Dyspepsia   . CAD (coronary artery disease)     Cath 2008, all grafts patent,   . HTN (hypertension)   . Hyperlipidemia   .  GERD (gastroesophageal reflux disease)   . Thrombosis of arm     Left  . Elevated PSA 02/15/2011  . ICD (implantable cardiac defibrillator) battery depletion     Placed 2008  (eepisodes  nonsustained monomorphic VT in the past)  . Hx of CABG     1991  . Venous thrombosis     Left arm  . Carotid artery disease ([place])     Doppler, February, 2012, mild plaque, 0-39% bilateral, plan followup in one year  . Prostate cancer ([place]) 2012    IMRT, ADT  . Ejection fraction < 50%   . Myocardial infarction ([place]) 1998  . AICD (automatic cardioverter/defibrillator) present     Past Surgical History  Procedure Laterality Date  . Coronary artery bypass graft      LIMA_LAD, SVG-OM, Cx, RCA '91  . Rotator cuff repair  2005, 2007  . Tonsillectomy  2005    Malignant lesion, w/XRT  . Cystectomy      Hand  . Icd placement    . Thumb surgery    . 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 Cardiac catheterization  12/05/06  . Menistectomies, left knee  '09  . Implantable cardioverter defibrillator (icd) generator change  04/06/2015    Procedure: Icd Generator Change;  Surgeon: Evans Lance,  MD;  Location: [facility];  Service: Cardiovascular;;  . Cardiac catheterization N/A 12/21/2015    Procedure: Left Heart Cath and Coronary Angiography;  Surgeon: Lorretta Harp, MD;  Location: [facility];  Service: Cardiovascular;  Laterality: N/A;    Patient Active Problem List   Diagnosis Date Noted  . Chronic systolic CHF (congestive heart failure) ([place]) 12/30/2015    Priority: High  . CAD (coronary artery disease)     Priority: High  . Orthostatic hypotension 05/11/2016  . Elevated LFTs 03/24/2016  . Impaired fasting blood sugar 01/10/2016  . Epigastric pain 12/21/2015  . Atypical chest pain 12/21/2015  . ST elevation myocardial infarction (STEMI) ([place])   . Hx of CABG   . Adult ADHD   . Ejection fraction < 50%   . ICD (implantable  cardioverter-defibrillator) in place 03/10/2015  . Ventricular tachycardia ([place]) 03/05/2014  . Routine health maintenance 12/11/2013  . Advanced care planning/counseling discussion 12/10/2013  . Cardiomyopathy, ischemic   . Essential hypertension   . Hyperlipidemia   . Encounter for servicing of automatic implantable cardioverter-defibrillator (AICD) at end of battery life   . Carotid artery disease ([place])   . Prostate cancer ([place]) 11/09/2011  . 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 ESOPHAGEAL STRICTURE 08/23/2008      Current Outpatient Prescriptions  Medication Sig Dispense Refill  . aspirin 81 MG EC tablet Take 81 mg by mouth daily.      Marland Kitchen atorvastatin (LIPITOR) 80 MG tablet TAKE 1 TABLET BY MOUTH EVERY DAY 90 tablet 2  . buPROPion (WELLBUTRIN XL) 150 MG 24 hr tablet   2  . buPROPion (WELLBUTRIN XL) 300 MG 24 hr tablet Take 300 mg by mouth daily.    . busPIRone (BUSPAR) 30 MG tablet Take 20 mg by mouth daily.     . carvedilol (COREG) 25 MG tablet TAKE 1 TABLET BY MOUTH TWICE A DAY 180 tablet 3  . EPINEPHrine (EPIPEN) 0.3 mg/0.3 mL DEVI Inject 0.3 mLs (0.3 mg total) into the muscle as needed. 2 Device 0  . escitalopram (LEXAPRO) 20 MG tablet Take 20 mg by mouth daily.      Marland Kitchen esomeprazole (NEXIUM) 20 MG capsule Take 20 mg by mouth daily at 12 noon.    . Multiple Vitamins-Minerals (MULTIVITAMIN,TX-MINERALS) tablet Take 1 tablet by mouth daily.      . Multiple Vitamins-Minerals (OCUVITE PRESERVISION) TABS Take 1 tablet by mouth daily.     . nitroGLYCERIN (NITROSTAT) 0.4 MG SL tablet Place 1 tablet (0.4 mg total) under the tongue every 5 (five) minutes as needed for chest pain. 25 tablet 12  . pantoprazole (PROTONIX) 40 MG tablet TAKE 1 TABLET BY MOUTH EVERY DAY 90 tablet 3  . valsartan (DIOVAN) 160 MG tablet TAKE 1 TABLET BY MOUTH TWICE A DAY (Patient taking differently: PATIENT IS TAKING 80MG  TABLET ONCE DAILY BY MOUTH) 180 tablet 1  . zolpidem (AMBIEN) 10 MG tablet Take 1 tablet  by mouth at bedtime as needed for sleep.      No current facility-administered medications for this visit.    Allergies:   Other    Social History:  The patient  reports that he has never smoked. He has never used smokeless tobacco. He reports that he drinks about 14.4 oz of alcohol per week. He reports that he does not use illicit drugs.   Family History:  The patient's family history includes Alzheimer's disease in his mother; Breast cancer in his sister; CAD in  his father; Congestive Heart Failure in his father; Dementia in his mother; Heart disease in his brother; Hyperlipidemia in his father; Hypertension in his father. There is no history of Colon cancer, Prostate cancer, or Diabetes.    ROS:  Please see the history of present illness.  Patient denies fever, chills, headache, sweats, rash, change in vision, change in hearing, chest pain, cough, nausea or vomiting, urinary symptoms. All other systems are reviewed and are negative.   PHYSICAL EXAM: VS:  BP 122/90 mmHg  Pulse 56  Ht 6' (1.829 m)  Wt 194 lb (87.998 kg)  BMI 26.31 kg/m2 , patient is oriented to person time and place. Affect is normal. Head is atraumatic. Sclera conjunctiva are normal. There is no jugular venous distention. Lungs are clear. Respiratory effort is unlabored. Cardiac exam reveals an S1 and S2. The abdomen is soft. There is no peripheral edema. There are no musculoskeletal deformities. There are no skin rashes.   EKG:   EKG is not done today.  Recent Labs: 03/18/2016: Hemoglobin 13.5; Platelets 159 03/23/2016: ALT 74*; BUN 15; Creatinine, Ser 1.27; Potassium 4.3; Sodium 141    Lipid Panel    Component Value Date/Time   CHOL 134 12/30/2015 1121   TRIG 89 12/30/2015 1121   HDL 37* 12/30/2015 1121   CHOLHDL 3.6 12/30/2015 1121   VLDL 18 12/30/2015 1121   LDLCALC 79 12/30/2015 1121      Wt Readings from Last 3 Encounters:  06/01/16 194 lb (87.998 kg)  05/10/16 193 lb (87.544 kg)  03/25/16 197 lb  (89.359 kg)      Current medicines are reviewed  The patient understands his medications.     ASSESSMENT AND PLAN:  1. CAD -  His native coronary arteries are completely occluded. He had patent LIMA to the LAD and saphenous vein graft to the marginal system and to the RCA.  No recent angina.  . We will need to lower the dose of the carvedilol slightly because of some orthostatic hypotension.  2. Chronic systolic CHF - his breathing is stable. We will reduce the dose of carvedilol because of orthostatic hypotension. . 3. Essential HTN -  His BP has been running low in the middle of the day .  Will reduce his dose of Coreg to 12. 5 BID .  4. ICD  5. Hyperlipidemia - check labs in 6 months  Continue meds.    Mertie Moores, MD  06/01/2016 10:51 AM    [place] [place],  [place], [place]  [id number] Pager [phone] Phone: [phone]; Fax: [phone]   [facility]  [address] [place], [place]  [address] [phone]   Fax [phone] \

## 2016-06-01 NOTE — Patient Instructions (Signed)
Medication Instructions:  Your physician has recommended you make the following change in your medication:  1. DECREASE Carvedilol to 12.5mg  take one tablet by mouth twice a day  Labwork: Your physician recommends that you return for a FASTING LIPID and CMP in 6 MONTHS--nothing to eat or drink after midnight, lab opens at 7:30 AM  Testing/Procedures: No new orders.   Follow-Up: Your physician wants you to follow-up in: 6 MONTHS with Dr Acie Fredrickson.  You will receive a reminder letter in the mail two months in advance. If you don't receive a letter, please call our office to schedule the follow-up appointment.   Any Other Special Instructions Will Be Listed Below (If Applicable).     If you need a refill on your cardiac medications before your next appointment, please call your pharmacy.

## 2016-06-03 IMAGING — US US ABDOMEN LIMITED
1 series · 14 of 25 positions shown · non-contrast
Comparison: 12/21/2015 CT.

CLINICAL DATA: 69-year-old male with epigastric and right upper
quadrant abdominal pain.

EXAM:
US ABDOMEN LIMITED - RIGHT UPPER QUADRANT

[Series 1: us abdomen limited · 0.25mm/px · 14 of 43 slices shown]
[im 1/43]
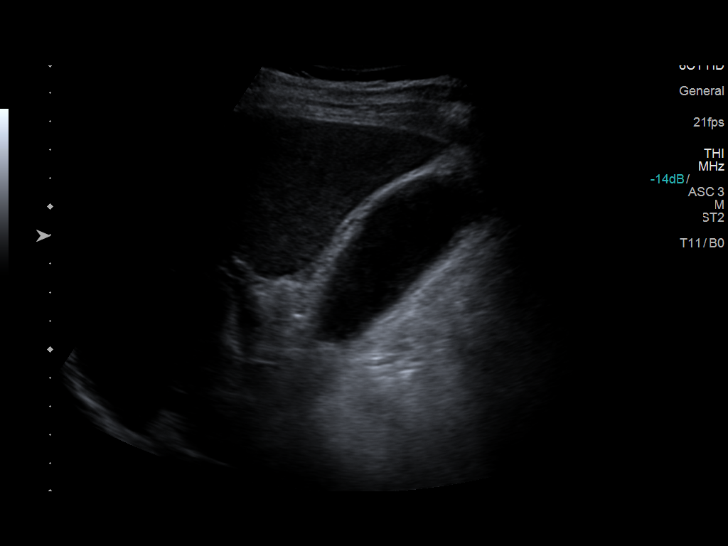
[im 4/43]
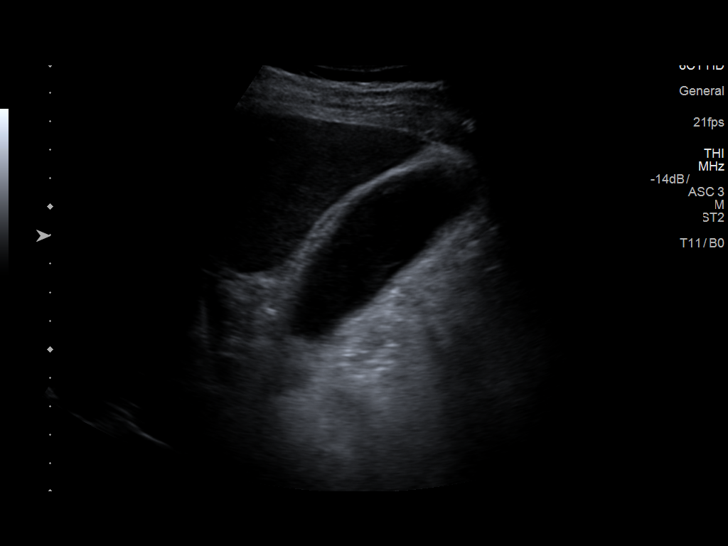
[im 8/43]
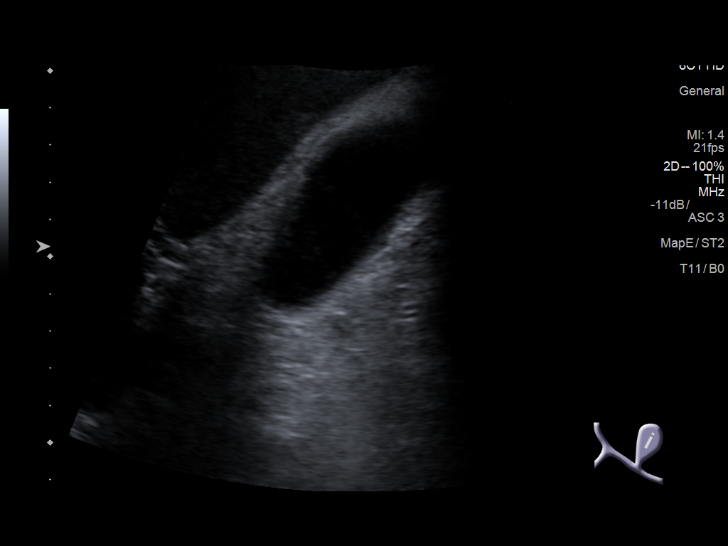
[im 11/43]
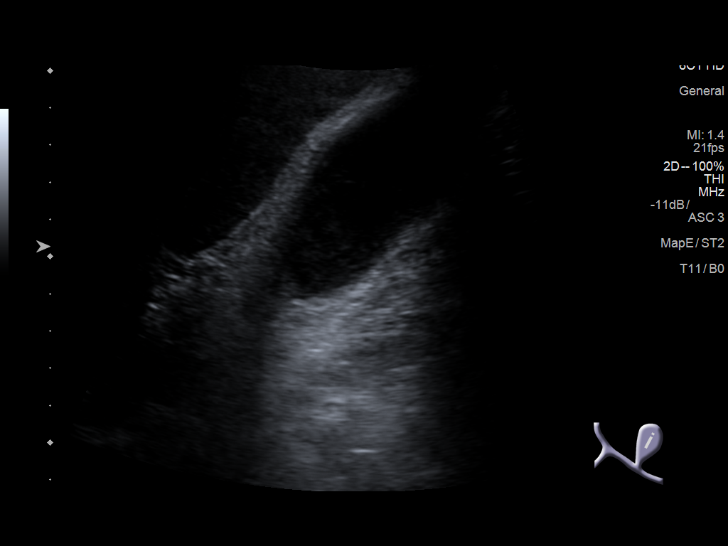
[im 15/43]
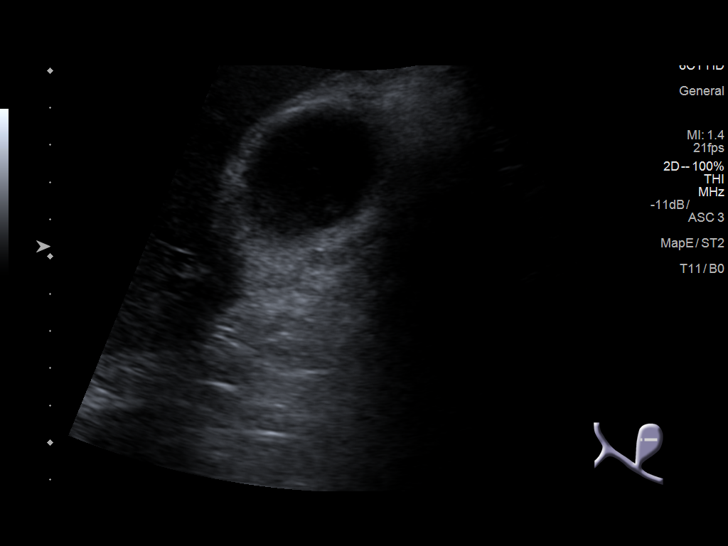
[im 16/43]
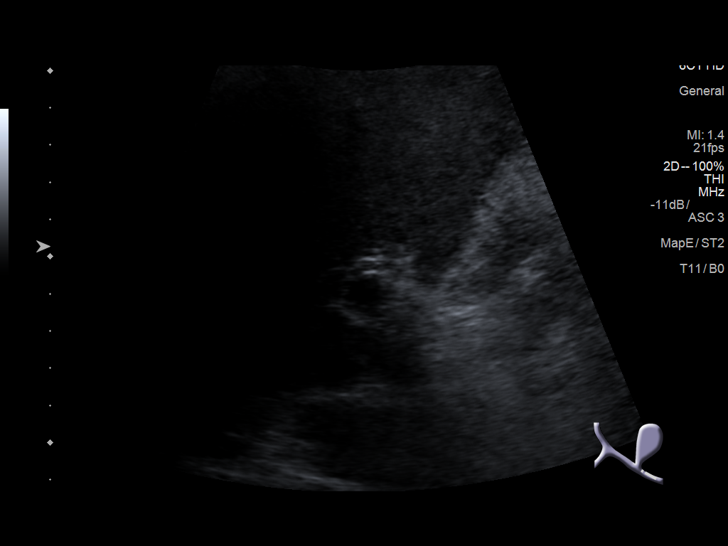
[im 20/43]
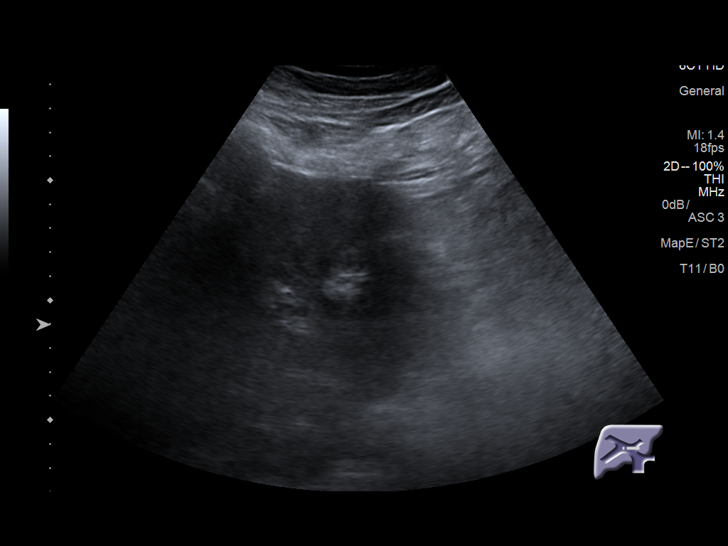
[im 23/43]
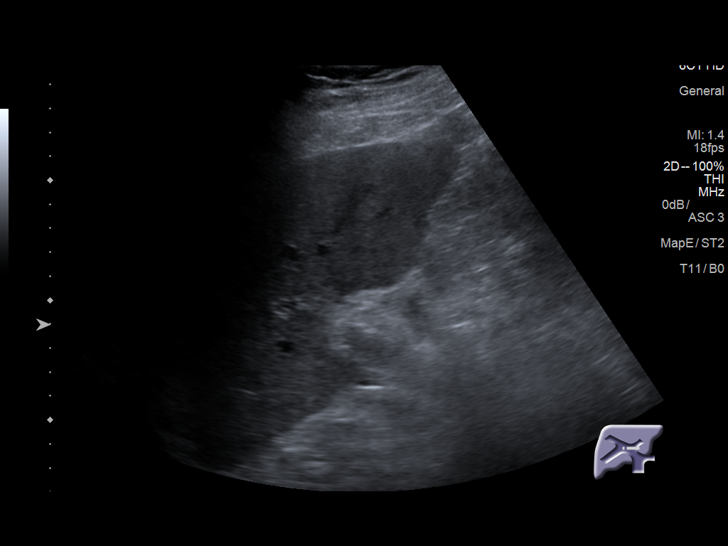
[im 27/43]
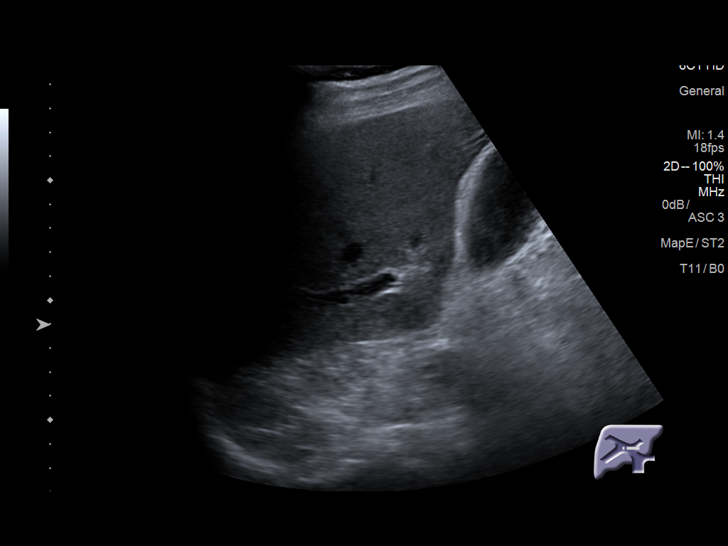
[im 29/43]
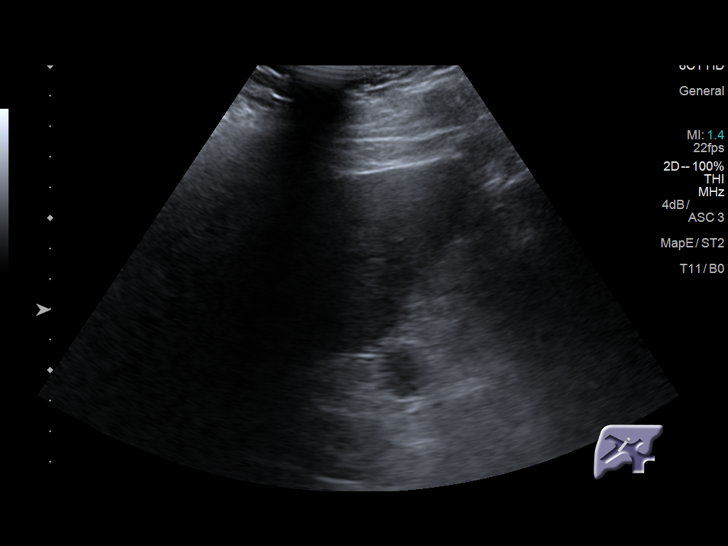
[im 32/43]
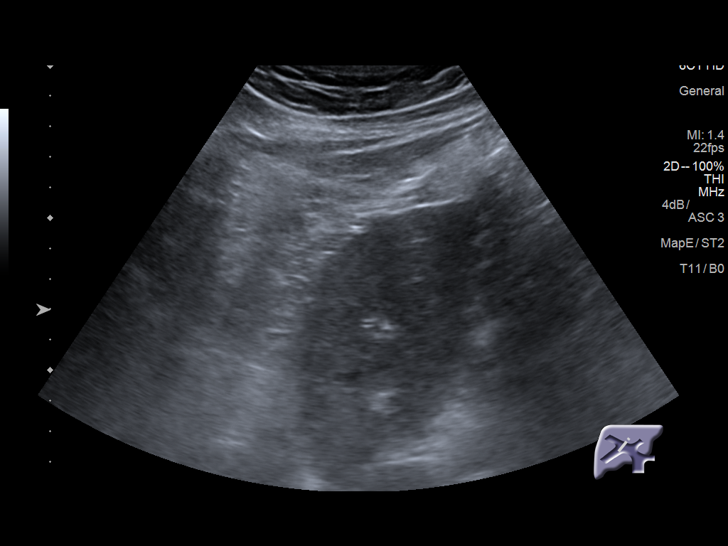
[im 36/43]
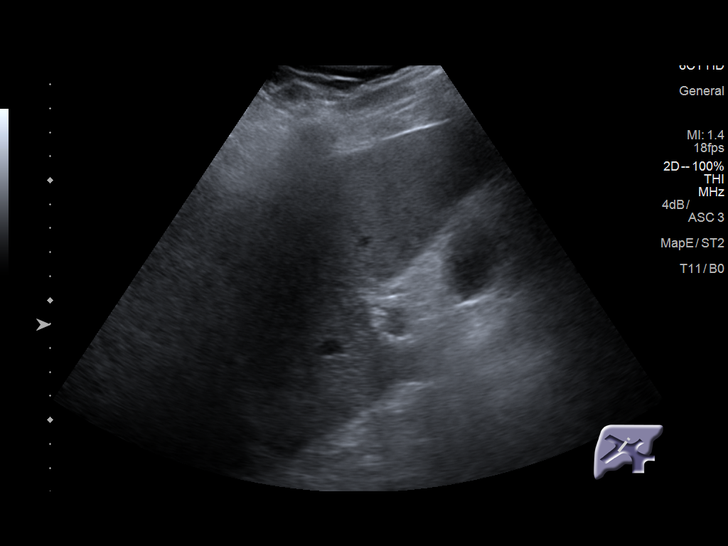
[im 39/43]
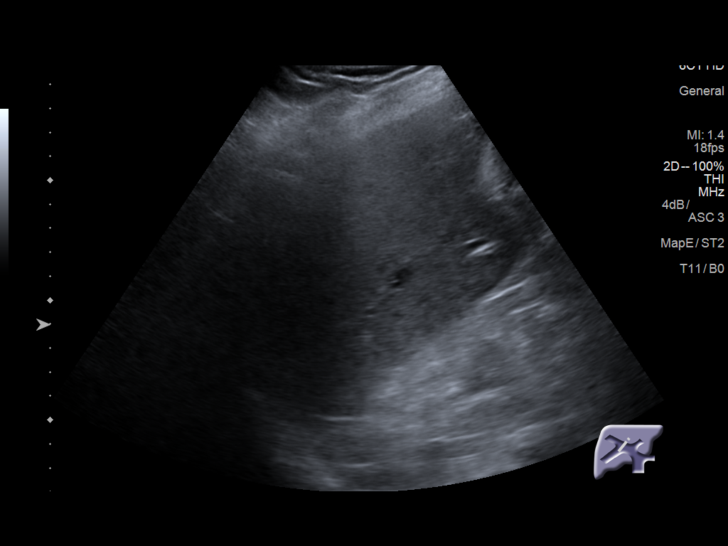
[im 43/43]
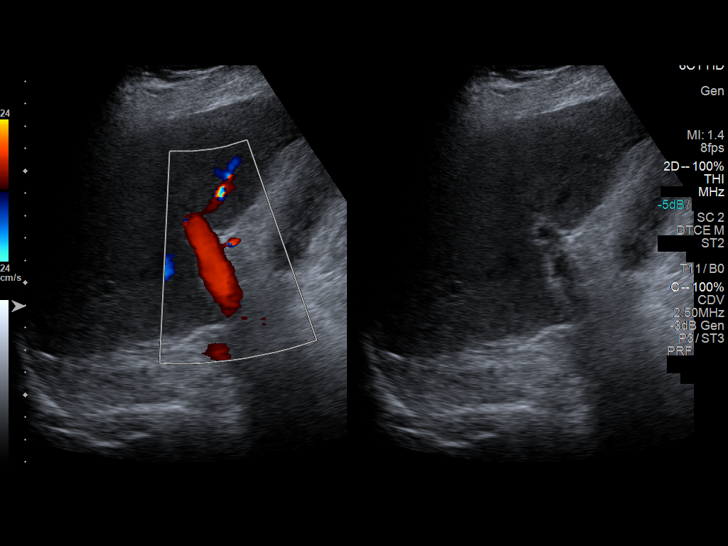

[14 of 25 positions shown; findings below may reference images not displayed]

FINDINGS: Gallbladder:

Gallbladder wall thickening is identified measuring up to 7 mm.
There is no evidence of cholelithiasis, pericholecystic fluid or
sonographic Murphy sign.

Common bile duct:

Diameter: 3 mm. There is no evidence of intrahepatic or extrahepatic
biliary dilatation.

Liver:

Heterogeneous hepatic echotexture identified without focal
abnormality.

There is no evidence of ascites within the right upper abdomen.
IMPRESSION: Gallbladder wall thickening without cholelithiasis or other signs of
acute cholecystitis. This may be related to hepatic dysfunction, but
if there is strong clinical suspicion for acute cholecystitis,
consider nuclear medicine study.

Heterogeneous hepatic echotexture without focal hepatic abnormality.
This can be seen with acute or chronic hepatocellular disease.

## 2016-06-22 ENCOUNTER — Encounter: Payer: Self-pay | Admitting: *Deleted

## 2016-07-12 ENCOUNTER — Encounter: Payer: Self-pay | Admitting: Internal Medicine

## 2016-07-12 ENCOUNTER — Ambulatory Visit (INDEPENDENT_AMBULATORY_CARE_PROVIDER_SITE_OTHER): Payer: Medicare Other | Admitting: Internal Medicine

## 2016-07-12 DIAGNOSIS — I472 Ventricular tachycardia, unspecified: Secondary | ICD-10-CM

## 2016-07-12 DIAGNOSIS — I255 Ischemic cardiomyopathy: Secondary | ICD-10-CM | POA: Diagnosis not present

## 2016-07-12 LAB — CUP PACEART INCLINIC DEVICE CHECK
Battery Remaining Longevity: 131 mo
HIGH POWER IMPEDANCE MEASURED VALUE: 58 Ohm
HighPow Impedance: 46 Ohm
Implantable Lead Implant Date: 20080108
Implantable Lead Location: 753860
Lead Channel Pacing Threshold Pulse Width: 0.4 ms
Lead Channel Sensing Intrinsic Amplitude: 21.875 mV
Lead Channel Setting Pacing Amplitude: 2 V
MDC IDC MSMT BATTERY VOLTAGE: 3.03 V
MDC IDC MSMT LEADCHNL RV IMPEDANCE VALUE: 304 Ohm
MDC IDC MSMT LEADCHNL RV IMPEDANCE VALUE: 475 Ohm
MDC IDC MSMT LEADCHNL RV PACING THRESHOLD AMPLITUDE: 1 V
MDC IDC MSMT LEADCHNL RV SENSING INTR AMPL: 26.875 mV
MDC IDC SESS DTM: 20170815102347
MDC IDC SET LEADCHNL RV PACING PULSEWIDTH: 0.4 ms
MDC IDC SET LEADCHNL RV SENSING SENSITIVITY: 0.3 mV
MDC IDC STAT BRADY RV PERCENT PACED: 2.34 %

## 2016-07-12 NOTE — Progress Notes (Signed)
HPI [name] returns today for followup. He is a very pleasant middle-age man with an ischemic cardiomyopathy, chronic systolic heart failure, and ventricular tachycardia. He is status post ICD implantation and generator change. He denies any recent ICD shocks. No peripheral edema, chest pain, no shortness of breath, no syncope. In the interim, he has done well. He has had a single ATP. He has not required hospitalization. Allergies  Allergen Reactions  . Other Anaphylaxis    Insect bites (Has EpiPen)     Current Outpatient Prescriptions  Medication Sig Dispense Refill  . aspirin 81 MG EC tablet Take 81 mg by mouth daily.      Marland Kitchen atorvastatin (LIPITOR) 80 MG tablet TAKE 1 TABLET BY MOUTH EVERY DAY 90 tablet 2  . buPROPion (WELLBUTRIN XL) 300 MG 24 hr tablet Take 300 mg by mouth daily.    . busPIRone (BUSPAR) 30 MG tablet Take 30 mg by mouth daily.     . carvedilol (COREG) 12.5 MG tablet Take 1 tablet (12.5 mg total) by mouth 2 (two) times daily. 60 tablet 8  . EPINEPHrine (EPIPEN IJ) Inject as directed as directed. Use as needed for anaphylaxis    . escitalopram (LEXAPRO) 20 MG tablet Take 20 mg by mouth daily.      Marland Kitchen esomeprazole (NEXIUM) 20 MG capsule Take 20 mg by mouth daily at 12 noon.    . Multiple Vitamins-Minerals (MULTIVITAMIN,TX-MINERALS) tablet Take 1 tablet by mouth daily.      . Multiple Vitamins-Minerals (OCUVITE PRESERVISION) TABS Take 1 tablet by mouth daily.     . nitroGLYCERIN (NITROSTAT) 0.4 MG SL tablet Place 1 tablet (0.4 mg total) under the tongue every 5 (five) minutes as needed for chest pain. 25 tablet 12  . pantoprazole (PROTONIX) 40 MG tablet TAKE 1 TABLET BY MOUTH EVERY DAY 90 tablet 3  . valsartan (DIOVAN) 160 MG tablet TAKE 1 TABLET BY MOUTH TWICE A DAY (Patient taking differently: PATIENT IS TAKING 80MG  TABLET ONCE DAILY BY MOUTH) 180 tablet 1  . zolpidem (AMBIEN) 10 MG tablet Take 1 tablet by mouth at bedtime as needed for sleep.      No current  facility-administered medications for this visit.      Past Medical History:  Diagnosis Date  . Adult ADHD   . AICD (automatic cardioverter/defibrillator) present   . CAD (coronary artery disease)    Cath 2008, all grafts patent,   . 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 Carotid artery disease ([place])    Doppler, February, 2012, mild plaque, 0-39% bilateral, plan followup in one year  . Dyspepsia   . Ejection fraction < 50%   . Elevated PSA 02/15/2011  . Esophageal stricture   . GERD (gastroesophageal reflux disease)   . HTN (hypertension)   . Hx of CABG    1991  . Hyperlipidemia   . 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 Myocardial infarction ([place]) 1998  . Prostate cancer ([place]) 2012   IMRT, ADT  . Thrombosis of arm    Left  . Venous thrombosis    Left arm    ROS:   All systems reviewed and negative except as noted in the HPI.   Past Surgical History:  Procedure Laterality Date  . CARDIAC CATHETERIZATION  12/05/06  .  CARDIAC CATHETERIZATION N/A 12/21/2015   Procedure: Left Heart Cath and Coronary Angiography;  Surgeon: Lorretta Harp, MD;  Location: [facility];  Service: Cardiovascular;  Laterality: N/A;  . CORONARY ARTERY BYPASS GRAFT     LIMA_LAD, SVG-OM, Cx, RCA '91  . CYSTECTOMY     Hand  . 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 ICD Placement    . IMPLANTABLE CARDIOVERTER DEFIBRILLATOR (ICD) GENERATOR CHANGE  04/06/2015   Procedure: Icd Generator Change;  Surgeon: Evans Lance, MD;  Location: [facility];  Service: Cardiovascular;;  . menistectomies, left knee  '09  . ROTATOR CUFF REPAIR  2005, 2007  . Thumb Surgery    . TONSILLECTOMY  2005   Malignant lesion, w/XRT     Family History   Problem Relation Age of Onset  . Alzheimer's disease Mother   . Dementia Mother     living in [place]  . Hyperlipidemia Father   . Hypertension Father   . CAD Father     had pacemaker  . Congestive Heart Failure Father   . Heart disease Brother     Bypass surgery  . Breast cancer Sister     survivor  . Colon cancer Neg Hx   . Prostate cancer Neg Hx   . Diabetes Neg Hx      Social History   Social History  . Marital status: Married    Spouse name: N/A  . Number of children: 0  . Years of education: N/A   Occupational History  . Retired Retired    Contractor for [employer]  . Smoking status: Never Smoker  . Smokeless tobacco: Never Used  . Alcohol use 14.4 oz/week    24 Cans of beer per week  . Drug use: No  . Sexual activity: Yes    Birth control/ protection: None   Other Topics Concern  . Not on file   Social History Narrative   [employer]. 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 Marriage in good health, wife in good health. ACP discussed Jan '15: no CPR, no mechanical ventilation. He is referred to [email or web address] and provided with packet with HCPOA-his wife./ Advanced directive.     BP 120/80   Pulse 64   Ht 6' (1.829 m)   Wt 194 lb 12.8 oz (88.4 kg)   BMI 26.42 kg/m   Physical Exam:  Well but diskempt appearing 70 yo man, NAD HEENT: Unremarkable Neck:  7 cm JVD, no thyromegally Lungs:  Clear with no wheezes, rales, or rhonchi. HEART:  Regular rate rhythm, no murmurs, no rubs, no clicks Abd:  soft, positive bowel sounds, no organomegally, no rebound, no guarding Ext:  2 plus pulses, no edema, no cyanosis, no clubbing Skin:  No rashes no nodules Neuro:  CN II through XII intact, motor grossly intact  DEVICE  Normal device function.  See PaceArt for details.  Assess/Plan:  1. VT - he has had a single episode of sustained VT at 182/min with successful ATP. He is instructed that he is not supposed to  drive for 6 months. 2. Chronic systolic heart failure - his symptoms remain class 2. No change in meds. 3. CAD - he denies chest pain or sob. 4. ICD - his medtronic single chamber ICD is working normally.  [name].D.

## 2016-07-12 NOTE — Patient Instructions (Signed)
Medication Instructions:  Your physician recommends that you continue on your current medications as directed. Please refer to the Current Medication list given to you today.   Labwork: None ordered  Testing/Procedures: None ordered   Follow-Up: Your physician wants you to follow-up in: 12 months with Dr Knox Saliva will receive a reminder letter in the mail two months in advance. If you don't receive a letter, please call our office to schedule the follow-up appointment.  Remote monitoring is used to monitor your  ICD from home. This monitoring reduces the number of office visits required to check your device to one time per year. It allows [place] to keep an eye on the functioning of your device to ensure it is working properly. You are scheduled for a device check from home on 10/11/16. You may send your transmission at any time that day. If you have a wireless device, the transmission will be sent automatically. After your physician reviews your transmission, you will receive a postcard with your next transmission date.    Any Other Special Instructions Will Be Listed Below (If Applicable).     If you need a refill on your cardiac medications before your next appointment, please call your pharmacy.

## 2016-08-16 DIAGNOSIS — F331 Major depressive disorder, recurrent, moderate: Secondary | ICD-10-CM | POA: Diagnosis not present

## 2016-08-16 DIAGNOSIS — F411 Generalized anxiety disorder: Secondary | ICD-10-CM | POA: Diagnosis not present

## 2016-08-16 DIAGNOSIS — F422 Mixed obsessional thoughts and acts: Secondary | ICD-10-CM | POA: Diagnosis not present

## 2016-08-16 DIAGNOSIS — F900 Attention-deficit hyperactivity disorder, predominantly inattentive type: Secondary | ICD-10-CM | POA: Diagnosis not present

## 2016-08-31 ENCOUNTER — Other Ambulatory Visit: Payer: Self-pay | Admitting: Cardiology

## 2016-09-27 DIAGNOSIS — F411 Generalized anxiety disorder: Secondary | ICD-10-CM | POA: Diagnosis not present

## 2016-09-27 DIAGNOSIS — F900 Attention-deficit hyperactivity disorder, predominantly inattentive type: Secondary | ICD-10-CM | POA: Diagnosis not present

## 2016-09-27 DIAGNOSIS — F422 Mixed obsessional thoughts and acts: Secondary | ICD-10-CM | POA: Diagnosis not present

## 2016-09-27 DIAGNOSIS — F331 Major depressive disorder, recurrent, moderate: Secondary | ICD-10-CM | POA: Diagnosis not present

## 2016-10-05 DIAGNOSIS — C61 Malignant neoplasm of prostate: Secondary | ICD-10-CM | POA: Diagnosis not present

## 2016-10-11 ENCOUNTER — Ambulatory Visit (INDEPENDENT_AMBULATORY_CARE_PROVIDER_SITE_OTHER): Payer: Medicare Other | Admitting: *Deleted

## 2016-10-11 DIAGNOSIS — I255 Ischemic cardiomyopathy: Secondary | ICD-10-CM | POA: Diagnosis not present

## 2016-10-11 LAB — CUP PACEART REMOTE DEVICE CHECK
Battery Remaining Longevity: 129 mo
Battery Voltage: 3.03 V
Brady Statistic RV Percent Paced: 0.82 %
Date Time Interrogation Session: 20171114062406
HIGH POWER IMPEDANCE MEASURED VALUE: 55 Ohm
HighPow Impedance: 45 Ohm
Implantable Lead Location: 753860
Implantable Pulse Generator Implant Date: 20160509
Lead Channel Impedance Value: 342 Ohm
Lead Channel Impedance Value: 399 Ohm
Lead Channel Sensing Intrinsic Amplitude: 26.125 mV
Lead Channel Sensing Intrinsic Amplitude: 26.125 mV
Lead Channel Setting Pacing Amplitude: 2 V
MDC IDC LEAD IMPLANT DT: 20080108
MDC IDC MSMT LEADCHNL RV PACING THRESHOLD AMPLITUDE: 1 V
MDC IDC MSMT LEADCHNL RV PACING THRESHOLD PULSEWIDTH: 0.4 ms
MDC IDC SET LEADCHNL RV PACING PULSEWIDTH: 0.4 ms
MDC IDC SET LEADCHNL RV SENSING SENSITIVITY: 0.3 mV

## 2016-10-11 NOTE — Progress Notes (Signed)
Remote ICD transmission.   

## 2016-10-12 DIAGNOSIS — Z8546 Personal history of malignant neoplasm of prostate: Secondary | ICD-10-CM | POA: Diagnosis not present

## 2016-10-12 DIAGNOSIS — N5201 Erectile dysfunction due to arterial insufficiency: Secondary | ICD-10-CM | POA: Diagnosis not present

## 2016-11-01 ENCOUNTER — Other Ambulatory Visit (INDEPENDENT_AMBULATORY_CARE_PROVIDER_SITE_OTHER): Payer: Medicare Other

## 2016-11-01 DIAGNOSIS — I2510 Atherosclerotic heart disease of native coronary artery without angina pectoris: Secondary | ICD-10-CM | POA: Diagnosis not present

## 2016-11-01 DIAGNOSIS — E785 Hyperlipidemia, unspecified: Secondary | ICD-10-CM | POA: Diagnosis not present

## 2016-11-01 DIAGNOSIS — I2583 Coronary atherosclerosis due to lipid rich plaque: Secondary | ICD-10-CM | POA: Diagnosis not present

## 2016-11-01 LAB — COMPREHENSIVE METABOLIC PANEL
ALT: 17 U/L (ref 9–46)
AST: 15 U/L (ref 10–35)
Albumin: 4.1 g/dL (ref 3.6–5.1)
Alkaline Phosphatase: 79 U/L (ref 40–115)
BUN: 14 mg/dL (ref 7–25)
CHLORIDE: 106 mmol/L (ref 98–110)
CO2: 27 mmol/L (ref 20–31)
Calcium: 9 mg/dL (ref 8.6–10.3)
Creat: 1.21 mg/dL (ref 0.70–1.25)
Glucose, Bld: 104 mg/dL — ABNORMAL HIGH (ref 65–99)
POTASSIUM: 3.9 mmol/L (ref 3.5–5.3)
Sodium: 143 mmol/L (ref 135–146)
Total Bilirubin: 1 mg/dL (ref 0.2–1.2)
Total Protein: 6.1 g/dL (ref 6.1–8.1)

## 2016-11-01 LAB — LIPID PANEL
CHOL/HDL RATIO: 2.5 ratio (ref <5.0)
Cholesterol: 153 mg/dL (ref <200)
HDL: 61 mg/dL (ref >40)
LDL CALC: 65 mg/dL (ref <100)
TRIGLYCERIDES: 134 mg/dL (ref <150)
VLDL: 27 mg/dL (ref <30)

## 2016-11-15 ENCOUNTER — Encounter (INDEPENDENT_AMBULATORY_CARE_PROVIDER_SITE_OTHER): Payer: Medicare Other | Admitting: Ophthalmology

## 2016-11-15 DIAGNOSIS — H43813 Vitreous degeneration, bilateral: Secondary | ICD-10-CM

## 2016-11-15 DIAGNOSIS — H4312 Vitreous hemorrhage, left eye: Secondary | ICD-10-CM

## 2016-11-15 DIAGNOSIS — H353111 Nonexudative age-related macular degeneration, right eye, early dry stage: Secondary | ICD-10-CM

## 2016-11-15 DIAGNOSIS — D3132 Benign neoplasm of left choroid: Secondary | ICD-10-CM | POA: Diagnosis not present

## 2016-11-15 DIAGNOSIS — H35033 Hypertensive retinopathy, bilateral: Secondary | ICD-10-CM | POA: Diagnosis not present

## 2016-11-15 DIAGNOSIS — I10 Essential (primary) hypertension: Secondary | ICD-10-CM

## 2016-11-15 DIAGNOSIS — H33022 Retinal detachment with multiple breaks, left eye: Secondary | ICD-10-CM

## 2016-11-26 ENCOUNTER — Other Ambulatory Visit: Payer: Self-pay | Admitting: Cardiovascular Disease

## 2016-12-05 ENCOUNTER — Encounter (INDEPENDENT_AMBULATORY_CARE_PROVIDER_SITE_OTHER): Payer: Medicare Other | Admitting: Ophthalmology

## 2016-12-05 DIAGNOSIS — H33302 Unspecified retinal break, left eye: Secondary | ICD-10-CM

## 2016-12-16 DIAGNOSIS — F422 Mixed obsessional thoughts and acts: Secondary | ICD-10-CM | POA: Diagnosis not present

## 2016-12-16 DIAGNOSIS — F411 Generalized anxiety disorder: Secondary | ICD-10-CM | POA: Diagnosis not present

## 2016-12-16 DIAGNOSIS — F331 Major depressive disorder, recurrent, moderate: Secondary | ICD-10-CM | POA: Diagnosis not present

## 2016-12-16 DIAGNOSIS — F900 Attention-deficit hyperactivity disorder, predominantly inattentive type: Secondary | ICD-10-CM | POA: Diagnosis not present

## 2017-01-10 ENCOUNTER — Ambulatory Visit (INDEPENDENT_AMBULATORY_CARE_PROVIDER_SITE_OTHER): Payer: Medicare Other | Admitting: *Deleted

## 2017-01-10 DIAGNOSIS — I255 Ischemic cardiomyopathy: Secondary | ICD-10-CM

## 2017-01-10 NOTE — Progress Notes (Signed)
Remote ICD transmission.   

## 2017-01-11 ENCOUNTER — Encounter: Payer: Self-pay | Admitting: Cardiology

## 2017-01-13 LAB — CUP PACEART REMOTE DEVICE CHECK
Battery Remaining Longevity: 127 mo
Battery Voltage: 3.03 V
Date Time Interrogation Session: 20180213083524
HIGH POWER IMPEDANCE MEASURED VALUE: 53 Ohm
HighPow Impedance: 41 Ohm
Implantable Lead Location: 753860
Implantable Lead Model: 6947
Implantable Pulse Generator Implant Date: 20160509
Lead Channel Impedance Value: 399 Ohm
Lead Channel Pacing Threshold Pulse Width: 0.4 ms
Lead Channel Sensing Intrinsic Amplitude: 21.875 mV
Lead Channel Setting Pacing Amplitude: 2 V
Lead Channel Setting Pacing Pulse Width: 0.4 ms
MDC IDC LEAD IMPLANT DT: 20080108
MDC IDC MSMT LEADCHNL RV IMPEDANCE VALUE: 304 Ohm
MDC IDC MSMT LEADCHNL RV PACING THRESHOLD AMPLITUDE: 1 V
MDC IDC MSMT LEADCHNL RV SENSING INTR AMPL: 21.875 mV
MDC IDC SET LEADCHNL RV SENSING SENSITIVITY: 0.3 mV
MDC IDC STAT BRADY RV PERCENT PACED: 0.71 %

## 2017-01-16 ENCOUNTER — Other Ambulatory Visit: Payer: Self-pay | Admitting: Internal Medicine

## 2017-01-25 ENCOUNTER — Telehealth: Payer: Self-pay | Admitting: Cardiovascular Disease

## 2017-01-25 NOTE — Telephone Encounter (Signed)
[facility] pharmacy requesting a refill on Amlodipine 5 mg tablet. This medication is not on pt's med list and there is no documentation taking this pt off this medication. Please advise

## 2017-01-26 ENCOUNTER — Encounter: Payer: Self-pay | Admitting: Cardiovascular Disease

## 2017-01-26 NOTE — Telephone Encounter (Signed)
Refill not appropriate. Amlodipine was d/c'ed by Dr. Acie Fredrickson in February 2017.

## 2017-02-10 ENCOUNTER — Encounter: Payer: Self-pay | Admitting: Cardiovascular Disease

## 2017-02-10 ENCOUNTER — Ambulatory Visit (INDEPENDENT_AMBULATORY_CARE_PROVIDER_SITE_OTHER): Payer: Medicare Other | Admitting: Cardiovascular Disease

## 2017-02-10 ENCOUNTER — Other Ambulatory Visit: Payer: Medicare Other | Admitting: *Deleted

## 2017-02-10 VITALS — BP 140/102 | HR 65 | Ht 72.0 in | Wt 189.8 lb

## 2017-02-10 DIAGNOSIS — I10 Essential (primary) hypertension: Secondary | ICD-10-CM | POA: Diagnosis not present

## 2017-02-10 DIAGNOSIS — I2583 Coronary atherosclerosis due to lipid rich plaque: Secondary | ICD-10-CM

## 2017-02-10 DIAGNOSIS — I5022 Chronic systolic (congestive) heart failure: Secondary | ICD-10-CM

## 2017-02-10 DIAGNOSIS — E7800 Pure hypercholesterolemia, unspecified: Secondary | ICD-10-CM

## 2017-02-10 DIAGNOSIS — I2510 Atherosclerotic heart disease of native coronary artery without angina pectoris: Secondary | ICD-10-CM

## 2017-02-10 NOTE — Progress Notes (Signed)
Cardiology Office Note   Date:  02/10/2017   ID:  [name], DOB [birth year], MRN [id number]  PCP:  Hoyt Koch, MD  Cardiologist:  Mertie Moores, MD , previous [facility] Parker patient   Chief Complaint  Patient presents with  . Follow-up    cad   Problem List 1. CAD 2. Chronic systolic CHF 3. Essential HTN 4. ICD  5. Hyperlipidemia    History of Present Illness: [name] is a 71 y.o. male who presents today to follow-up coronary disease and ischemic cardiomyopathy and hypertension. He recently had an ICD generator change. He has seen Dr. Lovena Le in follow-up. He has significant left ventricular dysfunction. He has not had an echo for many many years. He has been clinically stable. He is on the medications for CHF that he has been able to tolerate in the past. Because of complicated medicines for his adult ADHD, I have chosen not to push his medicines any further over time. Of course this can always be reconsidered going forward.  He's done very well over the years and he continues to do well. He is not having any chest pain or shortness of breath. When his ICD has been interrogated, he has not had any recurring ventricular tachycardia.  June 01, 2016:  [name] is seen today for follow up  Brought some BP readings.  Has been low at times.   His symptoms of orthostatic hypotension. These low blood pressure readings typically occur at midday. He takes his pressure pills at 10 AM. His primary medical doctor has reduced his Diovan dose from 160 mg to 80 mg a day. His symptoms have improved since that time.  February 10, 2017:  [name] is seen back today for follow-up of his coronary artery disease and HTN.  BP is elevated today  - just took meds an hour ago BP at home is good . No cP.   Able to do his normal activities.   Past Medical History:  Diagnosis Date  . Adult ADHD   . AICD (automatic cardioverter/defibrillator) present   . CAD (coronary artery  disease)    Cath 2008, all grafts patent,   . 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 Carotid artery disease ([place])    Doppler, February, 2012, mild plaque, 0-39% bilateral, plan followup in one year  . Dyspepsia   . Ejection fraction < 50%   . Elevated PSA 02/15/2011  . Esophageal stricture   . GERD (gastroesophageal reflux disease)   . HTN (hypertension)   . Hx of CABG    1991  . Hyperlipidemia   . 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 Myocardial infarction 1998  . Prostate cancer ([place]) 2012   IMRT, ADT  . Thrombosis of arm    Left  . Venous thrombosis    Left arm    Past Surgical History:  Procedure Laterality Date  . CARDIAC CATHETERIZATION  12/05/06  . CARDIAC CATHETERIZATION N/A 12/21/2015   Procedure: Left Heart Cath and Coronary Angiography;  Surgeon: Lorretta Harp, MD;  Location: [facility];  Service: Cardiovascular;  Laterality: N/A;  . CORONARY ARTERY BYPASS GRAFT     LIMA_LAD, SVG-OM, Cx, RCA '91  . CYSTECTOMY     Hand  . 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 ICD Placement    . IMPLANTABLE CARDIOVERTER DEFIBRILLATOR (ICD) GENERATOR CHANGE  04/06/2015   Procedure: Icd Generator Change;  Surgeon: Evans Lance, MD;  Location: [facility];  Service: Cardiovascular;;  . menistectomies, left knee  '09  . ROTATOR CUFF REPAIR  2005, 2007  . Thumb Surgery    . TONSILLECTOMY  2005   Malignant lesion, w/XRT    Patient Active Problem List   Diagnosis Date Noted  . Chronic systolic CHF (congestive heart failure) ([place]) 12/30/2015    Priority: High  . CAD (coronary artery disease)     Priority: High  . Orthostatic hypotension 05/11/2016  . Elevated LFTs 03/24/2016  . Impaired fasting blood sugar  01/10/2016  . Epigastric pain 12/21/2015  . Atypical chest pain 12/21/2015  . ST elevation myocardial infarction (STEMI) ([place])   . Hx of CABG   . Adult ADHD   . Ejection fraction < 50%   . ICD (implantable cardioverter-defibrillator) in place 03/10/2015  . Ventricular tachycardia ([place]) 03/05/2014  . Routine health maintenance 12/11/2013  . Advanced care planning/counseling discussion 12/10/2013  . Cardiomyopathy, ischemic   . Essential hypertension   . Hyperlipidemia   . Encounter for servicing of automatic implantable cardioverter-defibrillator (AICD) at end of battery life   . Carotid artery disease ([place])   . Prostate cancer ([place]) 11/09/2011  . 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 ESOPHAGEAL STRICTURE 08/23/2008      Current Outpatient Prescriptions  Medication Sig Dispense Refill  . aspirin 81 MG EC tablet Take 81 mg by mouth daily.      Marland Kitchen atorvastatin (LIPITOR) 80 MG tablet TAKE 1 TABLET BY MOUTH EVERY DAY 90 tablet 3  . buPROPion (WELLBUTRIN XL) 300 MG 24 hr tablet Take 300 mg by mouth daily.    . busPIRone (BUSPAR) 30 MG tablet Take 30 mg by mouth daily.     . carvedilol (COREG) 12.5 MG tablet Take 1 tablet (12.5 mg total) by mouth 2 (two) times daily. 60 tablet 8  . EPINEPHrine (EPIPEN IJ) Inject as directed as directed. Use as needed for anaphylaxis    . escitalopram (LEXAPRO) 20 MG tablet Take 20 mg by mouth daily.      Marland Kitchen esomeprazole (NEXIUM) 20 MG capsule Take 20 mg by mouth daily at 12 noon.    . Multiple Vitamins-Minerals (MULTIVITAMIN,TX-MINERALS) tablet Take 1 tablet by mouth daily.      . Multiple Vitamins-Minerals (OCUVITE PRESERVISION) TABS Take 1 tablet by mouth daily.     . nitroGLYCERIN (NITROSTAT) 0.4 MG SL tablet Place 1 tablet (0.4 mg total) under the tongue every 5 (five) minutes as needed for chest pain. 25 tablet 12  . pantoprazole (PROTONIX) 40 MG tablet TAKE 1 TABLET BY MOUTH EVERY DAY 90 tablet 0  . valsartan (DIOVAN) 160 MG tablet TAKE 1 TABLET  BY MOUTH TWICE DAILY 180 tablet 0  . zolpidem (AMBIEN) 10 MG tablet Take 1 tablet by mouth at bedtime as needed for sleep.      No current facility-administered medications for this visit.     Allergies:   Other    Social History:  The patient  reports that he has never smoked. He has never used smokeless tobacco. He reports that he drinks about 14.4 oz of alcohol per week . He reports that he does not use drugs.   Family History:  The patient's family history includes Alzheimer's  disease in his mother; Breast cancer in his sister; CAD in his father; Congestive Heart Failure in his father; Dementia in his mother; Heart disease in his brother; Hyperlipidemia in his father; Hypertension in his father.    ROS:  Please see the history of present illness.  Patient denies fever, chills, headache, sweats, rash, change in vision, change in hearing, chest pain, cough, nausea or vomiting, urinary symptoms. All other systems are reviewed and are negative.   PHYSICAL EXAM: VS:  BP (!) 140/102 (BP Location: Left Arm, Patient Position: Sitting, Cuff Size: Normal)   Pulse 65   Ht 6' (1.829 m)   Wt 189 lb 12.8 oz (86.1 kg)   SpO2 97%   BMI 25.74 kg/m  , patient is oriented to person time and place. Affect is normal. Head is atraumatic. Sclera conjunctiva are normal. There is no jugular venous distention. Lungs are clear. Respiratory effort is unlabored. Cardiac exam reveals an S1 and S2. The abdomen is soft. There is no peripheral edema. There are no musculoskeletal deformities. There are no skin rashes.   EKG:   EKG is not done today.  Recent Labs: 03/18/2016: Hemoglobin 13.5; Platelets 159 11/01/2016: ALT 17; BUN 14; Creat 1.21; Potassium 3.9; Sodium 143    Lipid Panel    Component Value Date/Time   CHOL 153 11/01/2016 1100   TRIG 134 11/01/2016 1100   HDL 61 11/01/2016 1100   CHOLHDL 2.5 11/01/2016 1100   VLDL 27 11/01/2016 1100   LDLCALC 65 11/01/2016 1100      Wt Readings from Last 3  Encounters:  02/10/17 189 lb 12.8 oz (86.1 kg)  07/12/16 194 lb 12.8 oz (88.4 kg)  06/01/16 194 lb (88 kg)      Current medicines are reviewed  The patient understands his medications.     ASSESSMENT AND PLAN:  1. CAD -  His native coronary arteries are completely occluded. He had patent LIMA to the LAD and saphenous vein graft to the marginal system and to the RCA.  No recent angina.  . We will need to lower the dose of the carvedilol slightly because of some orthostatic hypotension.  2. Chronic systolic CHF - his breathing is stable.  . 3. Essential HTN -  His BP has been running low in the middle of the day .   stable   4. ICD  5. Hyperlipidemia - check labs today  Continue meds.   Will see him in 6 months    Mertie Moores, MD  02/10/2017 10:38 AM    [address],  [place] [place], [place]  [address] Pager [phone] Phone: [phone]; Fax: [phone]     \

## 2017-02-10 NOTE — Patient Instructions (Signed)
Medication Instructions:  Your physician recommends that you continue on your current medications as directed. Please refer to the Current Medication list given to you today.   Labwork: TODAY - cholesterol, complete metabolic panel   Testing/Procedures: None Ordered   Follow-Up: Your physician wants you to follow-up in: 6 months with Dr. Nahser.  You will receive a reminder letter in the mail two months in advance. If you don't receive a letter, please call our office to schedule the follow-up appointment.   If you need a refill on your cardiac medications before your next appointment, please call your pharmacy.   Thank you for choosing [facility]! Idolina Mantell, RN [phone]    

## 2017-02-10 NOTE — Addendum Note (Signed)
Addended by: Eulis Foster on: 02/10/2017 09:59 AM   Modules accepted: Orders

## 2017-02-10 NOTE — Progress Notes (Signed)
lipd

## 2017-02-11 LAB — COMPREHENSIVE METABOLIC PANEL
A/G RATIO: 1.9 (ref 1.2–2.2)
ALBUMIN: 4.2 g/dL (ref 3.6–4.8)
ALT: 19 IU/L (ref 0–44)
AST: 14 IU/L (ref 0–40)
Alkaline Phosphatase: 90 IU/L (ref 39–117)
BUN / CREAT RATIO: 12 (ref 10–24)
BUN: 15 mg/dL (ref 8–27)
Bilirubin Total: 0.9 mg/dL (ref 0.0–1.2)
CALCIUM: 9.2 mg/dL (ref 8.6–10.2)
CHLORIDE: 104 mmol/L (ref 96–106)
CO2: 26 mmol/L (ref 18–29)
Creatinine, Ser: 1.29 mg/dL — ABNORMAL HIGH (ref 0.76–1.27)
GFR, EST AFRICAN AMERICAN: 65 mL/min/{1.73_m2} (ref >59)
GFR, EST NON AFRICAN AMERICAN: 56 mL/min/{1.73_m2} — AB (ref >59)
GLUCOSE: 92 mg/dL (ref 65–99)
Globulin, Total: 2.2 g/dL (ref 1.5–4.5)
Potassium: 4.1 mmol/L (ref 3.5–5.2)
Sodium: 146 mmol/L — ABNORMAL HIGH (ref 134–144)
TOTAL PROTEIN: 6.4 g/dL (ref 6.0–8.5)

## 2017-02-11 LAB — LIPID PANEL
Chol/HDL Ratio: 2.8 ratio units (ref 0.0–5.0)
Cholesterol, Total: 168 mg/dL (ref 100–199)
HDL: 60 mg/dL (ref >39)
LDL Calculated: 80 mg/dL (ref 0–99)
Triglycerides: 138 mg/dL (ref 0–149)
VLDL CHOLESTEROL CAL: 28 mg/dL (ref 5–40)

## 2017-02-13 ENCOUNTER — Ambulatory Visit (INDEPENDENT_AMBULATORY_CARE_PROVIDER_SITE_OTHER): Payer: Medicare Other | Admitting: Ophthalmology

## 2017-03-09 ENCOUNTER — Other Ambulatory Visit: Payer: Self-pay | Admitting: Cardiovascular Disease

## 2017-04-04 ENCOUNTER — Ambulatory Visit (INDEPENDENT_AMBULATORY_CARE_PROVIDER_SITE_OTHER): Payer: Medicare Other | Admitting: Ophthalmology

## 2017-04-04 DIAGNOSIS — H43813 Vitreous degeneration, bilateral: Secondary | ICD-10-CM | POA: Diagnosis not present

## 2017-04-04 DIAGNOSIS — H2513 Age-related nuclear cataract, bilateral: Secondary | ICD-10-CM

## 2017-04-04 DIAGNOSIS — H353112 Nonexudative age-related macular degeneration, right eye, intermediate dry stage: Secondary | ICD-10-CM

## 2017-04-04 DIAGNOSIS — H33302 Unspecified retinal break, left eye: Secondary | ICD-10-CM | POA: Diagnosis not present

## 2017-04-04 DIAGNOSIS — D3132 Benign neoplasm of left choroid: Secondary | ICD-10-CM | POA: Diagnosis not present

## 2017-04-04 DIAGNOSIS — H353121 Nonexudative age-related macular degeneration, left eye, early dry stage: Secondary | ICD-10-CM | POA: Diagnosis not present

## 2017-04-04 DIAGNOSIS — I10 Essential (primary) hypertension: Secondary | ICD-10-CM | POA: Diagnosis not present

## 2017-04-04 DIAGNOSIS — H35033 Hypertensive retinopathy, bilateral: Secondary | ICD-10-CM

## 2017-04-10 ENCOUNTER — Other Ambulatory Visit: Payer: Self-pay | Admitting: Internal Medicine

## 2017-04-11 ENCOUNTER — Ambulatory Visit (INDEPENDENT_AMBULATORY_CARE_PROVIDER_SITE_OTHER): Payer: Medicare Other | Admitting: *Deleted

## 2017-04-11 DIAGNOSIS — I255 Ischemic cardiomyopathy: Secondary | ICD-10-CM | POA: Diagnosis not present

## 2017-04-11 NOTE — Progress Notes (Signed)
Remote ICD transmission.   

## 2017-04-12 ENCOUNTER — Encounter: Payer: Self-pay | Admitting: Cardiology

## 2017-04-12 LAB — CUP PACEART REMOTE DEVICE CHECK
Battery Remaining Longevity: 125 mo
HIGH POWER IMPEDANCE MEASURED VALUE: 53 Ohm
HighPow Impedance: 42 Ohm
Implantable Lead Implant Date: 20080108
Implantable Lead Location: 753860
Implantable Lead Model: 6947
Implantable Pulse Generator Implant Date: 20160509
Lead Channel Pacing Threshold Amplitude: 1 V
Lead Channel Pacing Threshold Pulse Width: 0.4 ms
Lead Channel Setting Pacing Amplitude: 2 V
Lead Channel Setting Pacing Pulse Width: 0.4 ms
Lead Channel Setting Sensing Sensitivity: 0.3 mV
MDC IDC MSMT BATTERY VOLTAGE: 3.02 V
MDC IDC MSMT LEADCHNL RV IMPEDANCE VALUE: 304 Ohm
MDC IDC MSMT LEADCHNL RV IMPEDANCE VALUE: 399 Ohm
MDC IDC MSMT LEADCHNL RV SENSING INTR AMPL: 22.625 mV
MDC IDC MSMT LEADCHNL RV SENSING INTR AMPL: 22.625 mV
MDC IDC SESS DTM: 20180514140800
MDC IDC STAT BRADY RV PERCENT PACED: 0.95 %

## 2017-04-28 ENCOUNTER — Encounter: Payer: Self-pay | Admitting: Cardiology

## 2017-05-05 ENCOUNTER — Encounter: Payer: Self-pay | Admitting: Family

## 2017-05-05 ENCOUNTER — Ambulatory Visit: Payer: Medicare Other | Admitting: Internal Medicine

## 2017-05-05 ENCOUNTER — Ambulatory Visit (INDEPENDENT_AMBULATORY_CARE_PROVIDER_SITE_OTHER): Payer: Medicare Other | Admitting: Family

## 2017-05-05 DIAGNOSIS — J0100 Acute maxillary sinusitis, unspecified: Secondary | ICD-10-CM | POA: Diagnosis not present

## 2017-05-05 DIAGNOSIS — J309 Allergic rhinitis, unspecified: Secondary | ICD-10-CM | POA: Insufficient documentation

## 2017-05-05 MED ORDER — AMOXICILLIN-POT CLAVULANATE 875-125 MG PO TABS: 1.0000 | ORAL_TABLET | Freq: Two times a day (BID) | ORAL | 0 refills | Status: DC

## 2017-05-05 NOTE — Assessment & Plan Note (Signed)
New onset sinusitis most likely bacterial. Start Augmentin. Continue over-the-counter medications as needed for symptom relief and supportive care. Follow-up if symptoms worsen or do not improve.

## 2017-05-05 NOTE — Patient Instructions (Signed)
Thank you for choosing [facility].  SUMMARY AND INSTRUCTIONS:  Medication:  Your prescription(s) have been submitted to your pharmacy or been printed and provided for you. Please take as directed and contact our office if you believe you are having problem(s) with the medication(s) or have any questions.   Follow up:  If your symptoms worsen or fail to improve, please contact our office for further instruction, or in case of emergency go directly to the emergency room at the closest medical facility.    General Recommendations:    Please drink plenty of fluids.  Get plenty of rest   Sleep in humidified air  Use saline nasal sprays  Netti pot   OTC Medications:  Decongestants - helps relieve congestion   Flonase (generic fluticasone) or Nasacort (generic triamcinolone) - please make sure to use the "cross-over" technique at a 45 degree angle towards the opposite eye as opposed to straight up the nasal passageway.   Sudafed (generic pseudoephedrine - Note this is the one that is available behind the pharmacy counter); Products with phenylephrine (-PE) may also be used but is often not as effective as pseudoephedrine.   If you have HIGH BLOOD PRESSURE - Coricidin HBP; AVOID any product that is -D as this contains pseudoephedrine which may increase your blood pressure.  Afrin (oxymetazoline) every 6-8 hours for up to 3 days.   Allergies - helps relieve runny nose, itchy eyes and sneezing   Claritin (generic loratidine), Allegra (fexofenidine), or Zyrtec (generic cyrterizine) for runny nose. These medications should not cause drowsiness.  Note - Benadryl (generic diphenhydramine) may be used however may cause drowsiness  Cough -   Delsym or Robitussin (generic dextromethorphan)  Expectorants - helps loosen mucus to ease removal   Mucinex (generic guaifenesin) as directed on the package.  Headaches / General Aches   Tylenol (generic acetaminophen) - DO  NOT EXCEED 3 grams (3,000 mg) in a 24 hour time period  Advil/Motrin (generic ibuprofen)   Sore Throat -   Salt water gargle   Chloraseptic (generic benzocaine) spray or lozenges / Sucrets (generic dyclonine)    Sinusitis Sinusitis is redness, soreness, and inflammation of the paranasal sinuses. Paranasal sinuses are air pockets within the bones of your face (beneath the eyes, the middle of the forehead, or above the eyes). In healthy paranasal sinuses, mucus is able to drain out, and air is able to circulate through them by way of your nose. However, when your paranasal sinuses are inflamed, mucus and air can become trapped. This can allow bacteria and other germs to grow and cause infection. Sinusitis can develop quickly and last only a short time (acute) or continue over a long period (chronic). Sinusitis that lasts for more than 12 weeks is considered chronic.  CAUSES  Causes of sinusitis include:  Allergies.  Structural abnormalities, such as displacement of the cartilage that separates your nostrils (deviated septum), which can decrease the air flow through your nose and sinuses and affect sinus drainage.  Functional abnormalities, such as when the small hairs (cilia) that line your sinuses and help remove mucus do not work properly or are not present. SIGNS AND SYMPTOMS  Symptoms of acute and chronic sinusitis are the same. The primary symptoms are pain and pressure around the affected sinuses. Other symptoms include:  Upper toothache.  Earache.  Headache.  Bad breath.  Decreased sense of smell and taste.  A cough, which worsens when you are lying flat.  Fatigue.  Fever.  Thick drainage   from your nose, which often is green and may contain pus (purulent).  Swelling and warmth over the affected sinuses. DIAGNOSIS  Your health care provider will perform a physical exam. During the exam, your health care provider may:  Look in your nose for signs of abnormal growths  in your nostrils (nasal polyps).  Tap over the affected sinus to check for signs of infection.  View the inside of your sinuses (endoscopy) using an imaging device that has a light attached (endoscope). If your health care provider suspects that you have chronic sinusitis, one or more of the following tests may be recommended:  Allergy tests.  Nasal culture. A sample of mucus is taken from your nose, sent to a lab, and screened for bacteria.  Nasal cytology. A sample of mucus is taken from your nose and examined by your health care provider to determine if your sinusitis is related to an allergy. TREATMENT  Most cases of acute sinusitis are related to a viral infection and will resolve on their own within 10 days. Sometimes medicines are prescribed to help relieve symptoms (pain medicine, decongestants, nasal steroid sprays, or saline sprays).  However, for sinusitis related to a bacterial infection, your health care provider will prescribe antibiotic medicines. These are medicines that will help kill the bacteria causing the infection.  Rarely, sinusitis is caused by a fungal infection. In theses cases, your health care provider will prescribe antifungal medicine. For some cases of chronic sinusitis, surgery is needed. Generally, these are cases in which sinusitis recurs more than 3 times per year, despite other treatments. HOME CARE INSTRUCTIONS   Drink plenty of water. Water helps thin the mucus so your sinuses can drain more easily.  Use a humidifier.  Inhale steam 3 to 4 times a day (for example, sit in the bathroom with the shower running).  Apply a warm, moist washcloth to your face 3 to 4 times a day, or as directed by your health care provider.  Use saline nasal sprays to help moisten and clean your sinuses.  Take medicines only as directed by your health care provider.  If you were prescribed either an antibiotic or antifungal medicine, finish it all even if you start to feel  better. SEEK IMMEDIATE MEDICAL CARE IF:  You have increasing pain or severe headaches.  You have nausea, vomiting, or drowsiness.  You have swelling around your face.  You have vision problems.  You have a stiff neck.  You have difficulty breathing. MAKE SURE YOU:   Understand these instructions.  Will watch your condition.  Will get help right away if you are not doing well or get worse. Document Released: 11/14/2005 Document Revised: 03/31/2014 Document Reviewed: 11/29/2011 ExitCare Patient Information 2015 [employer]. This information is not intended to replace advice given to you by your health care provider. Make sure you discuss any questions you have with your health care provider.   

## 2017-05-05 NOTE — Progress Notes (Signed)
Subjective:    Patient ID: [name], male    DOB: [birth year], 71 y.o.   MRN: [id number]  Chief Complaint  Patient presents with  . Nasal Congestion    has green congestion and productive cough, x1 month    HPI:  [name] is a 71 y.o. 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 and presents today for an office visit.  This is a new problem. Associated symptoms of fever, headaches, sore throat, productive cough and congestion have been going on for about for about 1 month. Highest temperature was 101 with the last fever about 1 week ago. Modifying factors include Exedrine which did not help very much. Course of the symptoms have gotten better and then got worse.   Allergies  Allergen Reactions  . Other Anaphylaxis    Insect bites (Has EpiPen)      Outpatient Medications Prior to Visit  Medication Sig Dispense Refill  . aspirin 81 MG EC tablet Take 81 mg by mouth daily.      Marland Kitchen atorvastatin (LIPITOR) 80 MG tablet TAKE 1 TABLET BY MOUTH EVERY DAY 90 tablet 3  . buPROPion (WELLBUTRIN XL) 300 MG 24 hr tablet Take 300 mg by mouth daily.    . busPIRone (BUSPAR) 30 MG tablet Take 30 mg by mouth daily.     . carvedilol (COREG) 12.5 MG tablet Take 1 tablet (12.5 mg total) by mouth 2 (two) times daily. 60 tablet 8  . EPINEPHrine (EPIPEN IJ) Inject as directed as directed. Use as needed for anaphylaxis    . escitalopram (LEXAPRO) 20 MG tablet Take 20 mg by mouth  daily.      Marland Kitchen esomeprazole (NEXIUM) 20 MG capsule Take 20 mg by mouth daily at 12 noon.    . Multiple Vitamins-Minerals (MULTIVITAMIN,TX-MINERALS) tablet Take 1 tablet by mouth daily.      . Multiple Vitamins-Minerals (OCUVITE PRESERVISION) TABS Take 1 tablet by mouth daily.     . nitroGLYCERIN (NITROSTAT) 0.4 MG SL tablet Place 1 tablet (0.4 mg total) under the tongue every 5 (five) minutes as needed for chest pain. 25 tablet 12  . pantoprazole (PROTONIX) 40 MG tablet TAKE 1 TABLET BY MOUTH EVERY DAY 30 tablet 0  . valsartan (DIOVAN) 160 MG tablet TAKE 1 TABLET BY MOUTH TWICE DAILY (Patient taking differently: TAKE 1/2 TABLET BY MOUTH TWICE DAILY) 180 tablet 3  . zolpidem (AMBIEN) 10 MG tablet Take 1 tablet by mouth at bedtime as needed for sleep.      No facility-administered medications prior to visit.      Past Medical History:  Diagnosis Date  . Adult ADHD   . AICD (automatic cardioverter/defibrillator) present   . CAD (coronary artery disease)    Cath 2008, all grafts patent,   . 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 Carotid artery disease ([place])    Doppler, February, 2012, mild plaque, 0-39% bilateral, plan followup in one year  . Dyspepsia   . Ejection fraction < 50%   . Elevated PSA 02/15/2011  . Esophageal stricture   .  GERD (gastroesophageal reflux disease)   . HTN (hypertension)   . Hx of CABG    1991  . Hyperlipidemia   . 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 Myocardial infarction ([place]) 1998  . Prostate cancer ([place]) 2012   IMRT, ADT  . Thrombosis of arm    Left  . Venous thrombosis    Left arm      Review of Systems  Constitutional: Positive for fever. Negative for chills.  HENT: Positive for congestion and sinus pressure. Negative for sinus pain and sore  throat.   Respiratory: Positive for cough. Negative for chest tightness and shortness of breath.   Neurological: Positive for headaches.      Objective:    BP 128/90 (BP Location: Left Arm, Patient Position: Sitting, Cuff Size: Large)   Pulse 70   Temp 99 F (37.2 C) (Oral)   Resp 16   Ht 6' (1.829 m)   Wt 187 lb (84.8 kg)   SpO2 96%   BMI 25.36 kg/m  Nursing note and vital signs reviewed.  Physical Exam  Constitutional: He is oriented to person, place, and time. He appears well-developed and well-nourished. No distress.  HENT:  Right Ear: Hearing, tympanic membrane, external ear and ear canal normal.  Left Ear: Hearing, tympanic membrane, external ear and ear canal normal.  Nose: Right sinus exhibits maxillary sinus tenderness. Right sinus exhibits no frontal sinus tenderness. Left sinus exhibits maxillary sinus tenderness. Left sinus exhibits no frontal sinus tenderness.  Mouth/Throat: Uvula is midline, oropharynx is clear and moist and mucous membranes are normal.  Neck: Neck supple.  Cardiovascular: Normal rate, regular rhythm, normal heart sounds and intact distal pulses.   Pulmonary/Chest: Effort normal and breath sounds normal.  Neurological: He is alert and oriented to person, place, and time.  Skin: Skin is warm and dry.  Psychiatric: He has a normal mood and affect. His behavior is normal. Judgment and thought content normal.       Assessment & Plan:   Problem List Items Addressed This Visit      Respiratory   Sinusitis    New onset sinusitis most likely bacterial. Start Augmentin. Continue over-the-counter medications as needed for symptom relief and supportive care. Follow-up if symptoms worsen or do not improve.      Relevant Medications   amoxicillin-clavulanate (AUGMENTIN) 875-125 MG tablet       I am having Mr. Ardoin start on amoxicillin-clavulanate. I am also having him maintain his aspirin, escitalopram, (multivitamin,tx-minerals), OCUVITE  PRESERVISION, busPIRone, zolpidem, nitroGLYCERIN, buPROPion, esomeprazole, carvedilol, EPINEPHrine (EPIPEN IJ), atorvastatin, valsartan, and pantoprazole.   Meds ordered this encounter  Medications  . amoxicillin-clavulanate (AUGMENTIN) 875-125 MG tablet    Sig: Take 1 tablet by mouth 2 (two) times daily.    Dispense:  20 tablet    Refill:  0    Order Specific Question:   Supervising Provider    Answer:   Pricilla Holm A [[id number]]     Follow-up: Return if symptoms worsen or fail to improve.  Mauricio Po, FNP

## 2017-05-09 DIAGNOSIS — F411 Generalized anxiety disorder: Secondary | ICD-10-CM | POA: Diagnosis not present

## 2017-05-09 DIAGNOSIS — F331 Major depressive disorder, recurrent, moderate: Secondary | ICD-10-CM | POA: Diagnosis not present

## 2017-05-09 DIAGNOSIS — F900 Attention-deficit hyperactivity disorder, predominantly inattentive type: Secondary | ICD-10-CM | POA: Diagnosis not present

## 2017-05-09 DIAGNOSIS — F422 Mixed obsessional thoughts and acts: Secondary | ICD-10-CM | POA: Diagnosis not present

## 2017-05-15 ENCOUNTER — Other Ambulatory Visit: Payer: Self-pay | Admitting: Internal Medicine

## 2017-05-19 ENCOUNTER — Ambulatory Visit (INDEPENDENT_AMBULATORY_CARE_PROVIDER_SITE_OTHER): Payer: Medicare Other | Admitting: Family

## 2017-05-19 ENCOUNTER — Encounter: Payer: Self-pay | Admitting: Family

## 2017-05-19 VITALS — BP 128/84 | HR 65 | Temp 98.2°F | Ht 72.0 in | Wt 185.0 lb

## 2017-05-19 DIAGNOSIS — I10 Essential (primary) hypertension: Secondary | ICD-10-CM

## 2017-05-19 DIAGNOSIS — I2510 Atherosclerotic heart disease of native coronary artery without angina pectoris: Secondary | ICD-10-CM | POA: Diagnosis not present

## 2017-05-19 DIAGNOSIS — I2583 Coronary atherosclerosis due to lipid rich plaque: Secondary | ICD-10-CM

## 2017-05-19 DIAGNOSIS — I5022 Chronic systolic (congestive) heart failure: Secondary | ICD-10-CM | POA: Diagnosis not present

## 2017-05-19 DIAGNOSIS — E7800 Pure hypercholesterolemia, unspecified: Secondary | ICD-10-CM

## 2017-05-19 DIAGNOSIS — C61 Malignant neoplasm of prostate: Secondary | ICD-10-CM | POA: Diagnosis not present

## 2017-05-19 DIAGNOSIS — Z0000 Encounter for general adult medical examination without abnormal findings: Secondary | ICD-10-CM | POA: Insufficient documentation

## 2017-05-19 NOTE — Assessment & Plan Note (Signed)
Appears euvolemic with no symptoms of exacerbation. Maintained on valsartan and carvedilol. Continue to monitor.

## 2017-05-19 NOTE — Assessment & Plan Note (Signed)
Stable with current medication regimen and no adverse side effects or myalgias. Continue current dosage of atorvastatin.

## 2017-05-19 NOTE — Patient Instructions (Signed)
Thank you for choosing [facility].  SUMMARY AND INSTRUCTIONS:  Discontinue pantoprazole. Continue OTC esomeprazole.  Medication:  Your prescription(s) have been submitted to your pharmacy or been printed and provided for you. Please take as directed and contact our office if you believe you are having problem(s) with the medication(s) or have any questions.  Follow up:  If your symptoms worsen or fail to improve, please contact our office for further instruction, or in case of emergency go directly to the emergency room at the closest medical facility.    Health Maintenance  Topic Date Due  . COLONOSCOPY  04/30/2018 (Originally 11/09/1997)  . PNA vac Low Risk Adult (1 of 2 - PCV13) 04/30/2018 (Originally 11/09/2012)  . INFLUENZA VACCINE  06/28/2017  . TETANUS/TDAP  01/13/2019  . Hepatitis C Screening  Completed     Health Maintenance, Male A healthy lifestyle and preventive care is important for your health and wellness. Ask your health care provider about what schedule of regular examinations is right for you. What should I know about weight and diet? Eat a Healthy Diet  Eat plenty of vegetables, fruits, whole grains, low-fat dairy products, and lean protein.  Do not eat a lot of foods high in solid fats, added sugars, or salt.  Maintain a Healthy Weight Regular exercise can help you achieve or maintain a healthy weight. You should:  Do at least 150 minutes of exercise each week. The exercise should increase your heart rate and make you sweat (moderate-intensity exercise).  Do strength-training exercises at least twice a week.  Watch Your Levels of Cholesterol and Blood Lipids  Have your blood tested for lipids and cholesterol every 5 years starting at 70 years of age. If you are at high risk for heart disease, you should start having your blood tested when you are 71 years old. You may need to have your cholesterol levels checked more often if: ? Your lipid or  cholesterol levels are high. ? You are older than 70 years of age. ? You are at high risk for heart disease.  What should I know about cancer screening? Many types of cancers can be detected early and may often be prevented. Lung Cancer  You should be screened every year for lung cancer if: ? You are a current smoker who has smoked for at least 30 years. ? You are a former smoker who has quit within the past 15 years.  Talk to your health care provider about your screening options, when you should start screening, and how often you should be screened.  Colorectal Cancer  Routine colorectal cancer screening usually begins at 70 years of age and should be repeated every 5-10 years until you are 70 years old. You may need to be screened more often if early forms of precancerous polyps or small growths are found. Your health care provider may recommend screening at an earlier age if you have risk factors for colon cancer.  Your health care provider may recommend using home test kits to check for hidden blood in the stool.  A small camera at the end of a tube can be used to examine your colon (sigmoidoscopy or colonoscopy). This checks for the earliest forms of colorectal cancer.  Prostate and Testicular Cancer  Depending on your age and overall health, your health care provider may do certain tests to screen for prostate and testicular cancer.  Talk to your health care provider about any symptoms or concerns you have about testicular or prostate  cancer.  Skin Cancer  Check your skin from head to toe regularly.  Tell your health care provider about any new moles or changes in moles, especially if: ? There is a change in a mole's size, shape, or color. ? You have a mole that is larger than a pencil eraser.  Always use sunscreen. Apply sunscreen liberally and repeat throughout the day.  Protect yourself by wearing long sleeves, pants, a wide-brimmed hat, and sunglasses when  outside.  What should I know about heart disease, diabetes, and high blood pressure?  If you are 19-70 years of age, have your blood pressure checked every 3-5 years. If you are 74 years of age or older, have your blood pressure checked every year. You should have your blood pressure measured twice-once when you are at a hospital or clinic, and once when you are not at a hospital or clinic. Record the average of the two measurements. To check your blood pressure when you are not at a hospital or clinic, you can use: ? An automated blood pressure machine at a pharmacy. ? A home blood pressure monitor.  Talk to your health care provider about your target blood pressure.  If you are between 4-48 years old, ask your health care provider if you should take aspirin to prevent heart disease.  Have regular diabetes screenings by checking your fasting blood sugar level. ? If you are at a normal weight and have a low risk for diabetes, have this test once every three years after the age of 53. ? If you are overweight and have a high risk for diabetes, consider being tested at a younger age or more often.  A one-time screening for abdominal aortic aneurysm (AAA) by ultrasound is recommended for men aged 60-75 years who are current or former smokers. What should I know about preventing infection? Hepatitis B If you have a higher risk for hepatitis B, you should be screened for this virus. Talk with your health care provider to find out if you are at risk for hepatitis B infection. Hepatitis C Blood testing is recommended for:  Everyone born from [birth year] through 1964.  Anyone with known risk factors for hepatitis C.  Sexually Transmitted Diseases (STDs)  You should be screened each year for STDs including gonorrhea and chlamydia if: ? You are sexually active and are younger than 70 years of age. ? You are older than 70 years of age and your health care provider tells you that you are at risk for this  type of infection. ? Your sexual activity has changed since you were last screened and you are at an increased risk for chlamydia or gonorrhea. Ask your health care provider if you are at risk.  Talk with your health care provider about whether you are at high risk of being infected with HIV. Your health care provider may recommend a prescription medicine to help prevent HIV infection.  What else can I do?  Schedule regular health, dental, and eye exams.  Stay current with your vaccines (immunizations).  Do not use any tobacco products, such as cigarettes, chewing tobacco, and e-cigarettes. If you need help quitting, ask your health care provider.  Limit alcohol intake to no more than 2 drinks per day. One drink equals 12 ounces of beer, 5 ounces of wine, or 1 ounces of hard liquor.  Do not use street drugs.  Do not share needles.  Ask your health care provider for help if you need support or information  about quitting drugs.  Tell your health care provider if you often feel depressed.  Tell your health care provider if you have ever been abused or do not feel safe at home. This information is not intended to replace advice given to you by your health care provider. Make sure you discuss any questions you have with your health care provider. Document Released: 05/12/2008 Document Revised: 07/13/2016 Document Reviewed: 08/18/2015 Elsevier Interactive Patient Education  [employer].

## 2017-05-19 NOTE — Assessment & Plan Note (Signed)
1) Anticipatory Guidance: Discussed importance of wearing a seatbelt while driving and not texting while driving; changing batteries in smoke detector at least once annually; wearing suntan lotion when outside; eating a balanced and moderate diet; getting physical activity at least 30 minutes per day.  2) Immunizations / Screenings / Labs:  Declines pneumococcal vaccination. All other immunizations are up-to-date per recommendations. Prostate cancer monitored by urology with no indication for PSA. Declines colon cancer screening. Hepatitis C screening is up-to-date. All other screenings are up-to-date per recommendations. Previous blood work reviewed with patient.  Overall well exam with risk factors for cardiovascular disease including hypertension, hyperlipidemia, and carotid artery disease. Chronic conditions appear adequately managed through medication regimen and surveillance. He is of good weight and exercises 3 times per week. Encouraged a nutritional intake that is moderate, balance, and varied. Continue other healthy lifestyle behaviors and choices. Follow-up prevention exam in 1 year. Follow-up office visit pending chronic conditions.

## 2017-05-19 NOTE — Assessment & Plan Note (Signed)
Stable with no current symptoms and managed by urology. Continue follow-up per urology.

## 2017-05-19 NOTE — Assessment & Plan Note (Signed)
No chest pain or other cardiac symptoms. Continue surveillance through risk factor adjustment in medication management as needed.

## 2017-05-19 NOTE — Assessment & Plan Note (Signed)
Blood pressure stable and well-controlled current medication regimen and no adverse side effects. Continue to monitor.

## 2017-05-19 NOTE — Assessment & Plan Note (Signed)
Reviewed and updated patient's medical, surgical, family and social history. Medications and allergies were also reviewed. Basic screenings for depression, activities of daily living, hearing, cognition and safety were performed. Provider list was updated and health plan was provided to the patient.  

## 2017-05-19 NOTE — Progress Notes (Signed)
Subjective:    Patient ID: [name], male    DOB: [birth year], 71 y.o.   MRN: [id number]  Chief Complaint  Patient presents with  . Wellness Exam  . Medication Refill    Protonix    HPI:  [name] is a 71 y.o. male who presents today for a Medicare Annual Wellness/Physical exam.    1) Health Maintenance -   Diet - Averages about 2-3 meals per day consisting of a regular diet; Caffeine intake of about 1-2 per day  Exercise - Walking; 3x per week for about 30 minutes  2) Preventative Exams / Immunizations:  Dental -- Up to date  Vision -- Up to date   Health Maintenance  Topic Date Due  . COLONOSCOPY  04/30/2018 (Originally 11/09/1997)  . PNA vac Low Risk Adult (1 of 2 - PCV13) 04/30/2018 (Originally 11/09/2012)  . INFLUENZA VACCINE  06/28/2017  . TETANUS/TDAP  01/13/2019  . Hepatitis C Screening  Completed       There is no immunization history on file for this patient.  RISK FACTORS  Tobacco History  Smoking Status  . Never Smoker  Smokeless Tobacco  . Never Used     Cardiac risk factors: advanced age (older than 58 for men, 67 for women), dyslipidemia, hypertension and male gender.  Depression Screen  Depression screen [facility] 2/9 05/19/2017  Decreased Interest 0  Down, Depressed, Hopeless 3  PHQ - 2 Score 3  Altered sleeping 0  Tired, decreased energy 0  Change in appetite 0  Feeling bad or failure about yourself  0  Trouble concentrating 3  Moving slowly or fidgety/restless 0  Suicidal thoughts 0  PHQ-9 Score 6     Activities of Daily Living In your present state of health, do you have any difficulty performing the following activities?:  Driving? No Managing money?  No Feeding yourself? No Getting from bed to chair? No Climbing a flight of stairs? No Preparing food and eating?: No Bathing or showering? No Getting dressed: No Getting to the toilet? No Using the toilet: No Moving around from place to place: No In  the past year have you fallen or had a near fall?:No   Home Safety Has smoke detector and wears seat belts. No firearms. No excess sun exposure. Are there smokers in your home (other than you)?  No Do you feel safe at home?  Yes  Hearing Difficulties: No Do you often ask people to speak up or repeat themselves? No Do you experience ringing or noises in your ears? No  Do you have difficulty understanding soft or whispered voices? No    Cognitive Testing  Alert? Yes   Normal Appearance? Yes  Oriented to person? Yes  Place? Yes   Time? Yes  Recall of three objects?  Yes  Can perform simple calculations? Yes  Displays appropriate judgment? Yes  Can read the correct time from a watch face? Yes  Do you feel that you have a problem with memory? No  Do you often misplace items? No   Advanced Directives have been discussed with the patient? Yes   Current Physicians/Providers and Suppliers  1. Pricilla Holm, MD - Internal Medicine 2. Cristopher Peru, MD - Cardiology 3. Alfonzo Beers, MD - Cardiology  Indicate any recent Medical Services you may have received from other than [facility] providers in the past year (date may be approximate).  All answers were reviewed with the patient and necessary referrals were made:  Mauricio Po,  FNP   05/19/2017    Allergies  Allergen Reactions  . Other Anaphylaxis    Insect bites (Has EpiPen)     Outpatient Medications Prior to Visit  Medication Sig Dispense Refill  . amoxicillin-clavulanate (AUGMENTIN) 875-125 MG tablet Take 1 tablet by mouth 2 (two) times daily. 20 tablet 0  . aspirin 81 MG EC tablet Take 81 mg by mouth daily.      Marland Kitchen atorvastatin (LIPITOR) 80 MG tablet TAKE 1 TABLET BY MOUTH EVERY DAY 90 tablet 3  . buPROPion (WELLBUTRIN XL) 300 MG 24 hr tablet Take 300 mg by mouth daily.    . busPIRone (BUSPAR) 30 MG tablet Take 30 mg by mouth daily.     . carvedilol (COREG) 12.5 MG tablet Take 1 tablet (12.5 mg total) by mouth 2 (two)  times daily. 60 tablet 8  . EPINEPHrine (EPIPEN IJ) Inject as directed as directed. Use as needed for anaphylaxis    . escitalopram (LEXAPRO) 20 MG tablet Take 20 mg by mouth daily.      Marland Kitchen esomeprazole (NEXIUM) 20 MG capsule Take 20 mg by mouth daily at 12 noon.    . Multiple Vitamins-Minerals (MULTIVITAMIN,TX-MINERALS) tablet Take 1 tablet by mouth daily.      . Multiple Vitamins-Minerals (OCUVITE PRESERVISION) TABS Take 1 tablet by mouth daily.     . nitroGLYCERIN (NITROSTAT) 0.4 MG SL tablet Place 1 tablet (0.4 mg total) under the tongue every 5 (five) minutes as needed for chest pain. 25 tablet 12  . valsartan (DIOVAN) 160 MG tablet TAKE 1 TABLET BY MOUTH TWICE DAILY (Patient taking differently: TAKE 1/2 TABLET BY MOUTH TWICE DAILY) 180 tablet 3  . zolpidem (AMBIEN) 10 MG tablet Take 1 tablet by mouth at bedtime as needed for sleep.     . pantoprazole (PROTONIX) 40 MG tablet TAKE 1 TABLET BY MOUTH EVERY DAY 30 tablet 0   No facility-administered medications prior to visit.      Past Medical History:  Diagnosis Date  . Adult ADHD   . AICD (automatic cardioverter/defibrillator) present   . CAD (coronary artery disease)    Cath 2008, all grafts patent,   . 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 Carotid artery disease ([place])    Doppler, February, 2012, mild plaque, 0-39% bilateral, plan followup in one year  . Dyspepsia   . Ejection fraction < 50%   . Elevated PSA 02/15/2011  . Esophageal stricture   . GERD (gastroesophageal reflux disease)   . HTN (hypertension)   . Hx of CABG    1991  . Hyperlipidemia   . 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 Myocardial infarction ([place]) 1998  . Prostate cancer ([place]) 2012   IMRT, ADT  . Thrombosis of arm    Left  . Venous thrombosis    Left  arm     Past Surgical History:  Procedure Laterality Date  . CARDIAC CATHETERIZATION  12/05/06  . CARDIAC CATHETERIZATION N/A 12/21/2015   Procedure: Left Heart Cath and Coronary Angiography;  Surgeon: Lorretta Harp, MD;  Location: [place] [facility];  Service: Cardiovascular;  Laterality: N/A;  . CORONARY ARTERY BYPASS GRAFT     LIMA_LAD, SVG-OM, Cx, RCA '91  . CYSTECTOMY     Hand  . 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 ICD Placement    . IMPLANTABLE CARDIOVERTER DEFIBRILLATOR (ICD) GENERATOR CHANGE  04/06/2015   Procedure: Icd Generator Change;  Surgeon: Evans Lance, MD;  Location: [place] [facility];  Service: Cardiovascular;;  . menistectomies, left knee  '09  . ROTATOR CUFF REPAIR  2005, 2007  . Thumb Surgery    . TONSILLECTOMY  2005   Malignant lesion, w/XRT     Family History  Problem Relation Age of Onset  . Alzheimer's disease Mother   . Dementia Mother        living in [place]  . Hyperlipidemia Father   . Hypertension Father   . CAD Father        had pacemaker  . Congestive Heart Failure Father   . Heart disease Brother        Bypass surgery  . Breast cancer Sister        survivor  . Colon cancer Neg Hx   . Prostate cancer Neg Hx   . Diabetes Neg Hx      Social History   Social History  . Marital status: Married    Spouse name: N/A  . Number of children: 0  . Years of education: 12   Occupational History  . 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 Smoking status: Never Smoker  . Smokeless tobacco: Never Used  . Alcohol use No  . Drug use: No  . Sexual activity: Yes    Birth control/ protection: None   Other Topics Concern  . Not on file   Social History Narrative   [employer]. 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 Marriage in good health, wife in good health. ACP discussed Jan '15: no CPR, no mechanical ventilation. He is referred to  [email or web address] and provided with packet with HCPOA-his wife./ Advanced directive.   Fun/Hobby: Yard work, Architect working.      Review of Systems  Constitutional: Denies fever, chills, fatigue, or significant weight gain/loss. HENT: Head: Denies headache or neck pain Ears: Denies changes in hearing, ringing in ears, earache, drainage Nose: Denies discharge, stuffiness, itching, nosebleed, sinus pain Throat: Denies sore throat, hoarseness, dry mouth, sores, thrush Eyes: Denies loss/changes in vision, pain, redness, blurry/double vision, flashing lights Cardiovascular: Denies chest pain/discomfort, tightness, palpitations, shortness of breath with activity, difficulty lying down, swelling, sudden awakening with shortness of breath Respiratory: Denies shortness of breath, cough, sputum production, wheezing Gastrointestinal: Denies dysphasia, heartburn, change in appetite, nausea, change in bowel habits, rectal bleeding, constipation, diarrhea, yellow skin or eyes Genitourinary: Denies frequency, urgency, burning/pain, blood in urine, incontinence, change in urinary strength. Musculoskeletal: Denies muscle/joint pain, stiffness, back pain, redness or swelling of joints, trauma Skin: Denies rashes, lumps, itching, dryness, color changes, or hair/nail changes Neurological: Denies dizziness, fainting, seizures, weakness, numbness, tingling, tremor Psychiatric - Denies nervousness, stress, depression or memory loss Endocrine: Denies heat or cold intolerance, sweating, frequent urination, excessive thirst, changes in appetite Hematologic: Denies ease of bruising or bleeding    Objective:     BP 128/84 (BP Location: Left Arm, Patient Position: Sitting, Cuff Size: Normal)   Pulse 65   Temp 98.2 F (36.8 C) (Oral)   Ht 6' (1.829 m)   Wt 185 lb (83.9 kg)   SpO2 99%   BMI 25.09 kg/m  Nursing note and vital signs reviewed.  Physical Exam  Constitutional: He is oriented to person,  place, and time. He appears well-developed and well-nourished.  HENT:  Head: Normocephalic.  Right Ear: Hearing, tympanic membrane, external ear and ear canal normal.  Left Ear: Hearing, tympanic membrane, external ear and ear canal normal.  Nose: Nose normal.  Mouth/Throat: Uvula is midline, oropharynx is clear and moist and mucous membranes are normal.  Eyes: Conjunctivae and EOM are normal. Pupils are equal, round, and reactive to light.  Neck: Neck supple. No JVD present. No tracheal deviation present. No thyromegaly present.  Cardiovascular: Normal rate, regular rhythm, normal heart sounds and intact distal pulses.   Pulmonary/Chest: Effort normal and breath sounds normal.  Abdominal: Soft. Bowel sounds are normal. He exhibits no distension and no mass. There is no tenderness. There is no rebound and no guarding.  Musculoskeletal: Normal range of motion. He exhibits no edema or tenderness.  Lymphadenopathy:    He has no cervical adenopathy.  Neurological: He is alert and oriented to person, place, and time. He has normal reflexes. No cranial nerve deficit. He exhibits normal muscle tone. Coordination normal.  Skin: Skin is warm and dry.  Psychiatric: He has a normal mood and affect. His behavior is normal. Judgment and thought content normal.       Assessment & Plan:   During the course of the visit the patient was educated and counseled about appropriate screening and preventive services including:    Pneumococcal vaccine   Prostate cancer screening  Colorectal cancer screening  Diabetes screening  Glaucoma screening  Nutrition counseling   Diet review for nutrition referral? Yes ____  Not Indicated _X___   Patient Instructions (the written plan) was given to the patient.  Medicare Attestation I have personally reviewed: The patient's medical and social history Their use of alcohol, tobacco or illicit drugs Their current medications and supplements The patient's  functional ability including ADLs,fall risks, home safety risks, cognitive, and hearing and visual impairment Diet and physical activities Evidence for depression or mood disorders  The patient's weight, height, BMI,  have been recorded in the chart.  I have made referrals, counseling, and provided education to the patient based on review of the above and I have provided the patient with a written personalized care plan for preventive services.     Problem List Items Addressed This Visit      Cardiovascular and Mediastinum   CAD (coronary artery disease)    No chest pain or other cardiac symptoms. Continue surveillance through risk factor adjustment in medication management as needed.      Essential hypertension    Blood pressure stable and well-controlled current medication regimen and no adverse side effects. Continue to monitor.      Chronic systolic CHF (congestive heart failure) (HCC)    Appears euvolemic with no symptoms of exacerbation. Maintained on valsartan and carvedilol. Continue to monitor.        Genitourinary   Prostate cancer ([place])    Stable with no current symptoms and managed by urology. Continue follow-up per urology.        Other   Hyperlipidemia    Stable with current medication regimen and no adverse side effects or myalgias. Continue current dosage of atorvastatin.      Routine health maintenance    1) Anticipatory Guidance: Discussed importance of wearing a seatbelt while driving and not texting while driving; changing batteries in smoke detector at least once annually; wearing suntan lotion when outside; eating a balanced and moderate diet; getting physical  activity at least 30 minutes per day.  2) Immunizations / Screenings / Labs:  Declines pneumococcal vaccination. All other immunizations are up-to-date per recommendations. Prostate cancer monitored by urology with no indication for PSA. Declines colon cancer screening. Hepatitis C screening is  up-to-date. All other screenings are up-to-date per recommendations. Previous blood work reviewed with patient.  Overall well exam with risk factors for cardiovascular disease including hypertension, hyperlipidemia, and carotid artery disease. Chronic conditions appear adequately managed through medication regimen and surveillance. He is of good weight and exercises 3 times per week. Encouraged a nutritional intake that is moderate, balance, and varied. Continue other healthy lifestyle behaviors and choices. Follow-up prevention exam in 1 year. Follow-up office visit pending chronic conditions.       Medicare annual wellness visit, subsequent - Primary    Reviewed and updated patient's medical, surgical, family and social history. Medications and allergies were also reviewed. Basic screenings for depression, activities of daily living, hearing, cognition and safety were performed. Provider list was updated and health plan was provided to the patient.           I have discontinued Mr. Moree's pantoprazole. I am also having him maintain his aspirin, escitalopram, (multivitamin,tx-minerals), OCUVITE PRESERVISION, busPIRone, zolpidem, nitroGLYCERIN, buPROPion, esomeprazole, carvedilol, EPINEPHrine (EPIPEN IJ), atorvastatin, valsartan, and amoxicillin-clavulanate.   Follow-up: Return in about 6 months (around 11/18/2017), or if symptoms worsen or fail to improve.   Mauricio Po, FNP

## 2017-05-28 IMAGING — CT CT ANGIO CHEST
2 of 9 series · 16 of 46 positions shown · IV contrast (omnipaque)
Comparison: None.

CLINICAL DATA: Sudden onset of chest pain, abdominal pain and back
pain. Evaluate for dissection.

EXAM:
CT ANGIOGRAPHY CHEST, ABDOMEN AND PELVIS
TECHNIQUE: Multidetector CT imaging through the chest, abdomen and pelvis was
performed using the standard protocol during bolus administration of
intravenous contrast. Multiplanar reconstructed images and MIPs were
obtained and reviewed to evaluate the vascular anatomy.
CONTRAST:  100mL OMNIPAQUE IOHEXOL 350 MG/ML SOLN

[Series 5: dissection 3.0 i30f 3 · axial · 0.88mm/px · z∈[+1474,+2070]mm · 13 of 227 slices shown]
[im 14/227  lung]
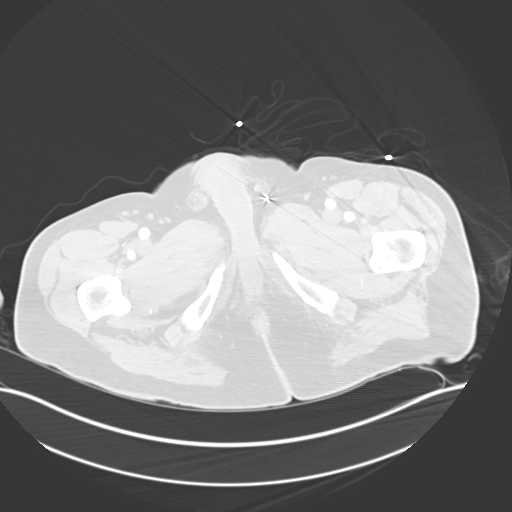
[im 27/227  soft-tissue]
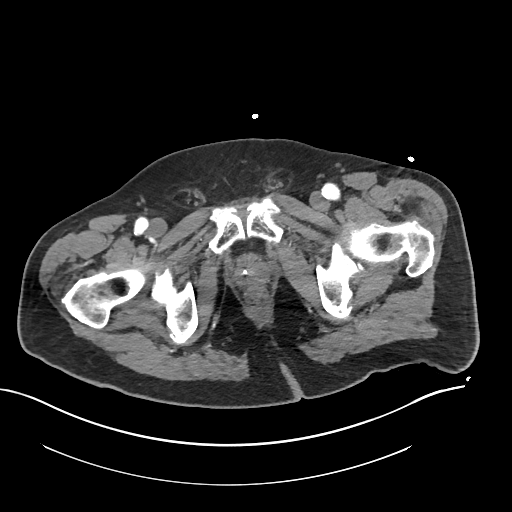
[im 54/227  lung]
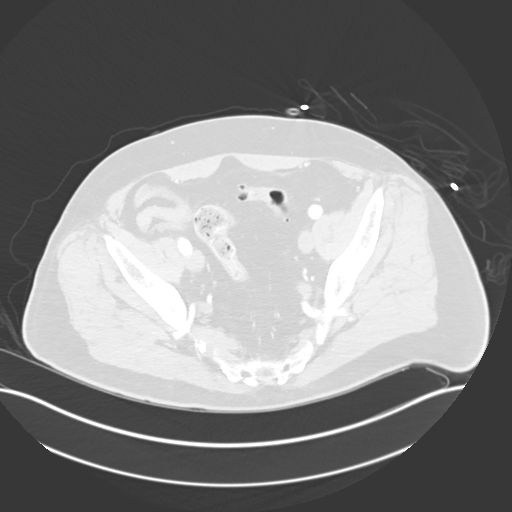
[im 67/227  soft-tissue]
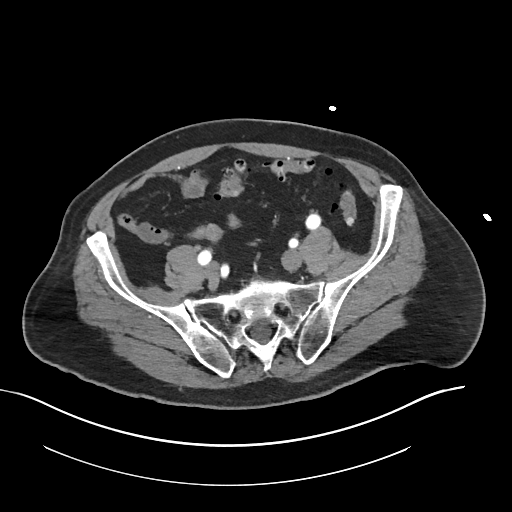
[im 80/227  lung]
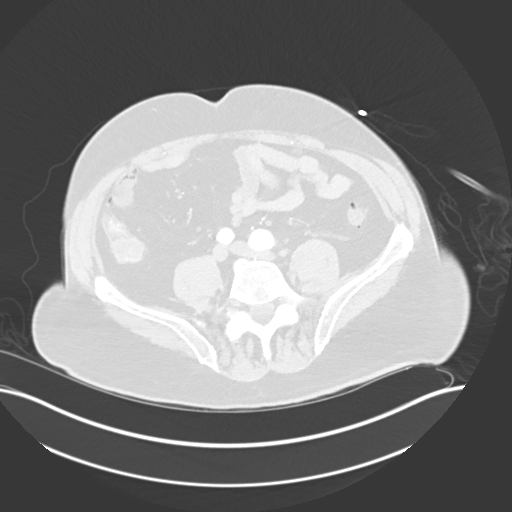
[im 94/227  soft-tissue]
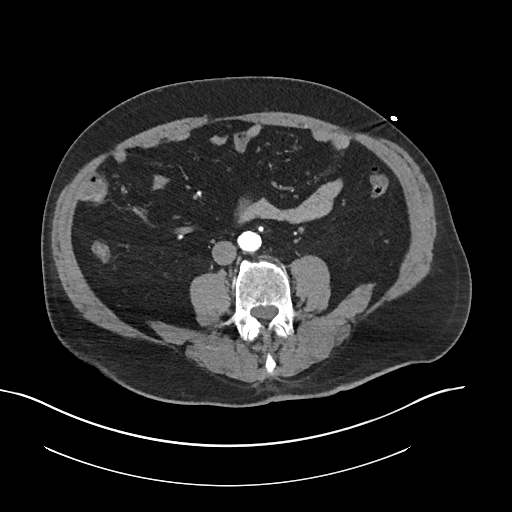
[im 120/227  lung]
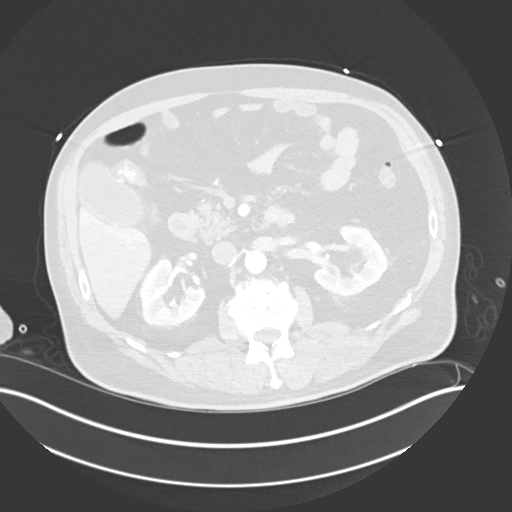
[im 133/227  soft-tissue]
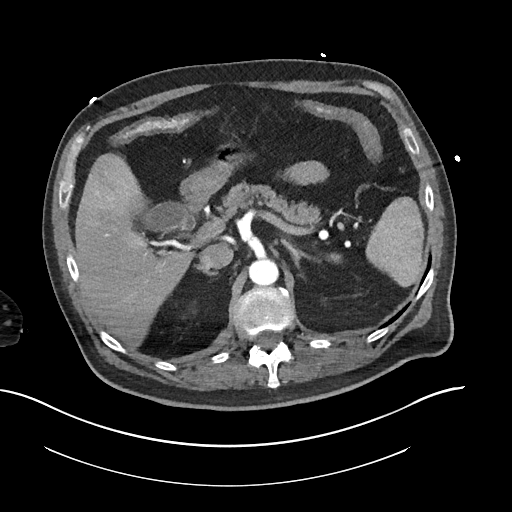
[im 147/227  lung]
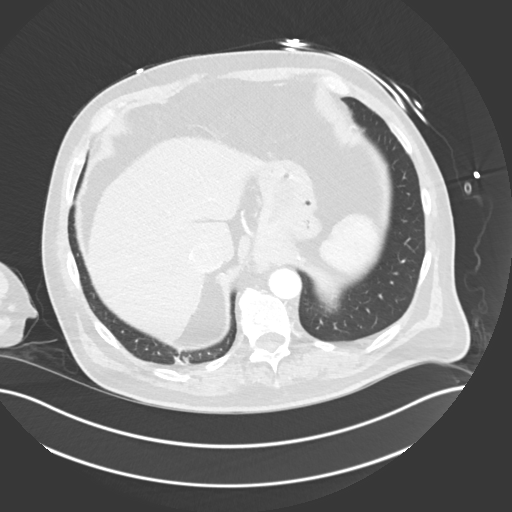
[im 160/227  soft-tissue]
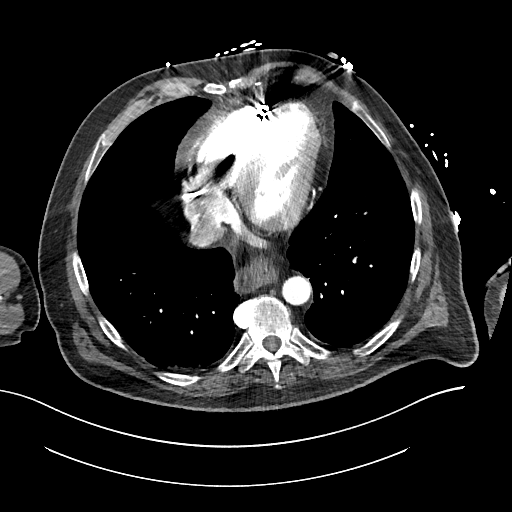
[im 173/227  lung]
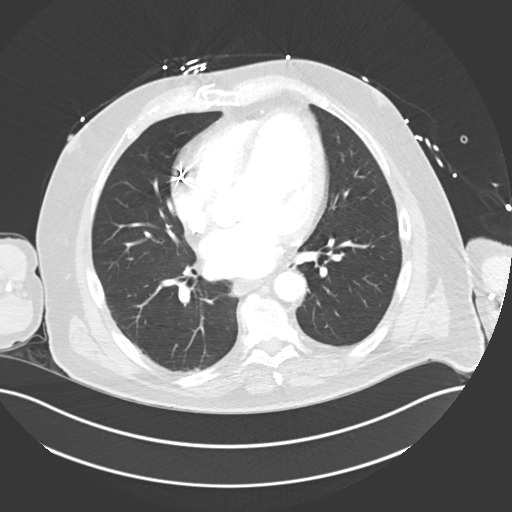
[im 200/227  soft-tissue]
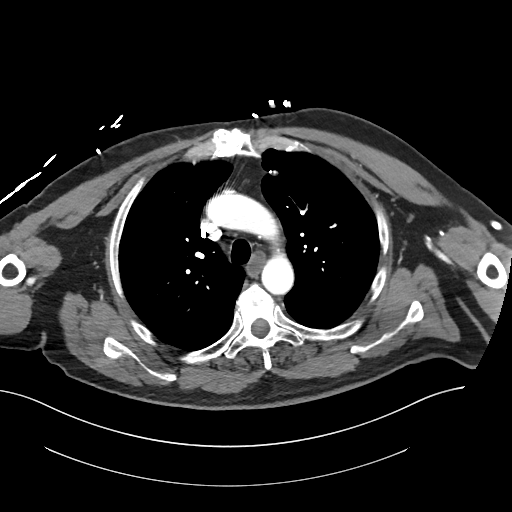
[im 213/227  lung]
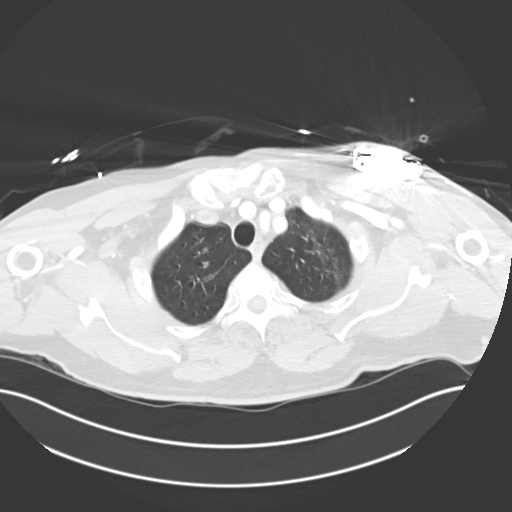

[Series 8: coronals · coronal · 0.79mm/px · 3 of 142 slices shown]
[im 36/142  soft-tissue]
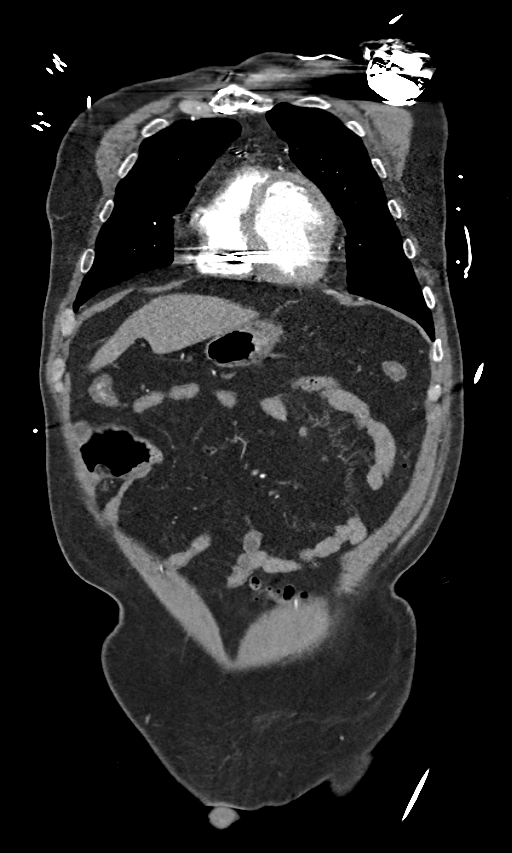
[im 71/142  soft-tissue]
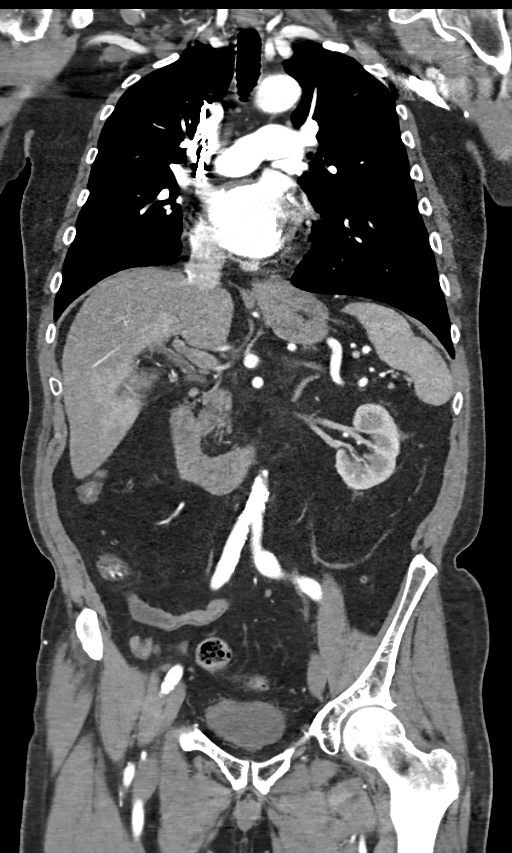
[im 106/142  soft-tissue]
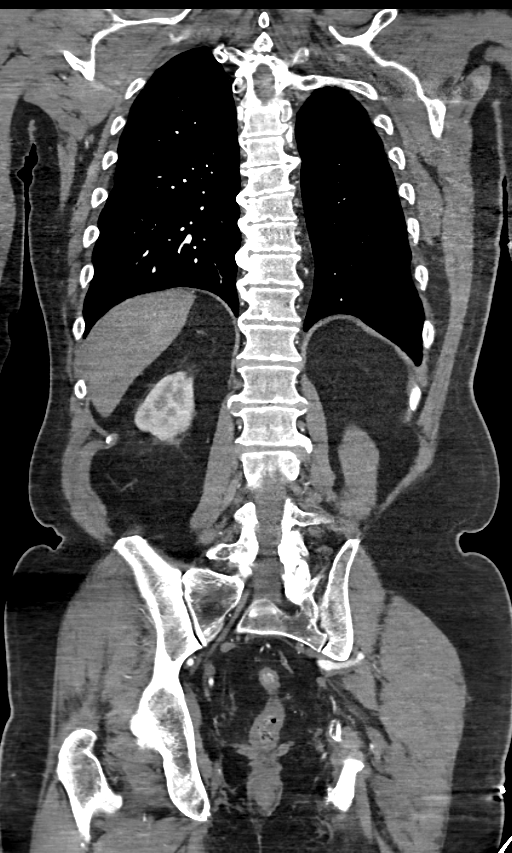

[16 of 46 positions shown; findings below may reference images not displayed]

FINDINGS: CTA CHEST FINDINGS

Prior CABG. Heart is normal size. Aorta is normal caliber. No
dissection. No mediastinal, hilar, or axillary adenopathy.
Moderate-sized hiatal hernia. Chest wall soft tissues are
unremarkable. Left pacer in place.

Minimal linear atelectasis or scarring dependently in the right
lower lobe. Lungs otherwise clear. No pleural effusions.

Review of the MIP images confirms the above findings.

CTA ABDOMEN AND PELVIS FINDINGS

No evidence of aortic aneurysm or dissection. Scattered
calcifications in the infrarenal aorta. Iliofemoral vessels are
normal caliber. No dissection.

Mild diffuse fatty infiltration of the liver. Questionable
gallbladder wall thickening with slight stranding inferior to the
gallbladder. Cannot completely exclude cholecystitis. No biliary
duct dilatation.

Spleen, pancreas, adrenals and kidneys are unremarkable. Descending
colonic and sigmoid diverticulosis. No active diverticulitis.
Stomach, small bowel and remainder of large bowel unremarkable. No
free fluid, free air or adenopathy. Urinary bladder appears mildly
thick walled but is decompressed. This could be related to a
component of bladder outlet obstruction related to the prostate.
Small bilateral inguinal hernias containing fat.

No acute bony abnormality or focal bone lesion.

Review of the MIP images confirms the above findings.
IMPRESSION: No evidence of aortic aneurysm or dissection. Mild infrarenal
atherosclerotic change in the aorta.

Fatty liver.

Possible gallbladder wall thickening and small amount of stranding
inferior to the gallbladder. Cannot exclude acute cholecystitis. If
there is clinical concern, recommend right upper quadrant
ultrasound.

Moderate-sized hiatal hernia.

Coronary artery disease, prior CABG.

Left colonic diverticulosis.

Critical Value/emergent results were called by telephone at the time
of interpretation on 12/21/2015 at [DATE] to Dr. VELAUG MENGS , who
verbally acknowledged these results.

## 2017-07-10 ENCOUNTER — Encounter: Payer: Self-pay | Admitting: Cardiovascular Disease

## 2017-07-10 ENCOUNTER — Other Ambulatory Visit: Payer: Self-pay

## 2017-07-10 DIAGNOSIS — I5022 Chronic systolic (congestive) heart failure: Secondary | ICD-10-CM

## 2017-07-10 DIAGNOSIS — I2583 Coronary atherosclerosis due to lipid rich plaque: Principal | ICD-10-CM

## 2017-07-10 DIAGNOSIS — I2510 Atherosclerotic heart disease of native coronary artery without angina pectoris: Secondary | ICD-10-CM

## 2017-07-10 DIAGNOSIS — I951 Orthostatic hypotension: Secondary | ICD-10-CM

## 2017-07-10 MED ORDER — CARVEDILOL 12.5 MG PO TABS: 12.5000 mg | ORAL_TABLET | Freq: Two times a day (BID) | ORAL | 8 refills | Status: DC

## 2017-07-11 ENCOUNTER — Telehealth: Payer: Self-pay

## 2017-07-11 ENCOUNTER — Ambulatory Visit (INDEPENDENT_AMBULATORY_CARE_PROVIDER_SITE_OTHER): Payer: Medicare Other | Admitting: *Deleted

## 2017-07-11 DIAGNOSIS — I5022 Chronic systolic (congestive) heart failure: Secondary | ICD-10-CM

## 2017-07-11 DIAGNOSIS — Z95810 Presence of automatic (implantable) cardiac defibrillator: Secondary | ICD-10-CM

## 2017-07-11 DIAGNOSIS — I255 Ischemic cardiomyopathy: Secondary | ICD-10-CM

## 2017-07-11 MED ORDER — CARVEDILOL 12.5 MG PO TABS: 12.5000 mg | ORAL_TABLET | Freq: Two times a day (BID) | ORAL | 11 refills | Status: DC

## 2017-07-11 NOTE — Progress Notes (Signed)
Remote defibrillator check.  

## 2017-07-11 NOTE — Telephone Encounter (Signed)
07/11/2017 1635 VM received from patient's wife indicating a prescription refill had been sent to [facility] instead of [facility].  Mrs Mula also indicated staff would not speak with her due to lack of signed DPR.  Involved staff educated on how to locate signed DPRs (signed DPR was located inpatient's chart).  Mrs Naas's message also stated they would be able to pick up prescriptions at [facility] this time, however she wants only [facility] listed as pharmacy. Call back, spoke with Mr Reichart to let him know pharmacy had been corrected and staff was educated about how to locate signed DPRs. Georgana Curio MHA RN CCM

## 2017-07-12 LAB — CUP PACEART REMOTE DEVICE CHECK
Battery Remaining Longevity: 123 mo
Battery Voltage: 3.02 V
Brady Statistic RV Percent Paced: 0.86 %
Date Time Interrogation Session: 20180814041603
HIGH POWER IMPEDANCE MEASURED VALUE: 46 Ohm
HIGH POWER IMPEDANCE MEASURED VALUE: 56 Ohm
Implantable Lead Model: 6947
Implantable Pulse Generator Implant Date: 20160509
Lead Channel Impedance Value: 399 Ohm
Lead Channel Pacing Threshold Pulse Width: 0.4 ms
Lead Channel Sensing Intrinsic Amplitude: 21.875 mV
Lead Channel Setting Pacing Amplitude: 2 V
Lead Channel Setting Pacing Pulse Width: 0.4 ms
MDC IDC LEAD IMPLANT DT: 20080108
MDC IDC LEAD LOCATION: 753860
MDC IDC MSMT LEADCHNL RV IMPEDANCE VALUE: 285 Ohm
MDC IDC MSMT LEADCHNL RV PACING THRESHOLD AMPLITUDE: 1 V
MDC IDC MSMT LEADCHNL RV SENSING INTR AMPL: 21.875 mV
MDC IDC SET LEADCHNL RV SENSING SENSITIVITY: 0.3 mV

## 2017-07-18 ENCOUNTER — Encounter: Payer: Self-pay | Admitting: Internal Medicine

## 2017-07-25 ENCOUNTER — Encounter: Payer: Self-pay | Admitting: Cardiology

## 2017-08-08 ENCOUNTER — Encounter: Payer: Self-pay | Admitting: Internal Medicine

## 2017-08-08 ENCOUNTER — Ambulatory Visit (INDEPENDENT_AMBULATORY_CARE_PROVIDER_SITE_OTHER): Payer: Medicare Other | Admitting: Internal Medicine

## 2017-08-08 VITALS — BP 128/84 | HR 57 | Ht 72.0 in | Wt 186.8 lb

## 2017-08-08 DIAGNOSIS — I472 Ventricular tachycardia, unspecified: Secondary | ICD-10-CM

## 2017-08-08 DIAGNOSIS — I5022 Chronic systolic (congestive) heart failure: Secondary | ICD-10-CM | POA: Diagnosis not present

## 2017-08-08 DIAGNOSIS — I255 Ischemic cardiomyopathy: Secondary | ICD-10-CM | POA: Diagnosis not present

## 2017-08-08 LAB — CUP PACEART INCLINIC DEVICE CHECK
Battery Remaining Longevity: 122 mo
Battery Voltage: 3.01 V
Brady Statistic RV Percent Paced: 0.84 %
Date Time Interrogation Session: 20180911133748
HIGH POWER IMPEDANCE MEASURED VALUE: 46 Ohm
HIGH POWER IMPEDANCE MEASURED VALUE: 60 Ohm
Implantable Lead Model: 6947
Lead Channel Impedance Value: 342 Ohm
Lead Channel Impedance Value: 399 Ohm
Lead Channel Pacing Threshold Pulse Width: 0.4 ms
Lead Channel Sensing Intrinsic Amplitude: 25.5 mV
Lead Channel Setting Pacing Amplitude: 2 V
Lead Channel Setting Pacing Pulse Width: 0.4 ms
MDC IDC LEAD IMPLANT DT: 20080108
MDC IDC LEAD LOCATION: 753860
MDC IDC MSMT LEADCHNL RV PACING THRESHOLD AMPLITUDE: 0.875 V
MDC IDC MSMT LEADCHNL RV SENSING INTR AMPL: 20.625 mV
MDC IDC PG IMPLANT DT: 20160509
MDC IDC SET LEADCHNL RV SENSING SENSITIVITY: 0.3 mV

## 2017-08-08 MED ORDER — LOSARTAN POTASSIUM 50 MG PO TABS: 50.0000 mg | ORAL_TABLET | Freq: Every day | ORAL | 11 refills | Status: DC

## 2017-08-08 NOTE — Patient Instructions (Addendum)
Medication Instructions:  Your physician has recommended you make the following change in your medication:  1.  Stop valsartan 2.  Start losartan 50 mg by mouth daily.  Labwork: Please have BMP in 2 weeks on 08/22/2017.  Come to [employer].  You do not need to be fasting.    Testing/Procedures: None ordered.  Follow-Up: Your physician wants you to follow-up in: one year with Dr. Lovena Le.   You will receive a reminder letter in the mail two months in advance. If you don't receive a letter, please call our office to schedule the follow-up appointment.  Remote monitoring is used to monitor your ICD from home. This monitoring reduces the number of office visits required to check your device to one time per year. It allows [place] to keep an eye on the functioning of your device to ensure it is working properly. You are scheduled for a device check from home on 10/10/2017. You may send your transmission at any time that day. If you have a wireless device, the transmission will be sent automatically. After your physician reviews your transmission, you will receive a postcard with your next transmission date.   Any Other Special Instructions Will Be Listed Below (If Applicable).   If you need a refill on your cardiac medications before your next appointment, please call your pharmacy.

## 2017-08-08 NOTE — Progress Notes (Signed)
HPI [name] returns today for follow-up of chronic systolic heart failure, ventricular tachycardia, and an ischemic cardiomyopathy. He is a very pleasant 71 year old man with known ventricular tachycardia status post ICD insertion many years ago. He underwent generator change out approximately 2 years ago. He has had no intercurrent ICD shocks. He denies chest pain or shortness of breath. He has questions today about his medications. Allergies  Allergen Reactions  . Other Anaphylaxis    Insect bites (Has EpiPen)     Current Outpatient Prescriptions  Medication Sig Dispense Refill  . aspirin 81 MG EC tablet Take 81 mg by mouth daily.      Marland Kitchen atorvastatin (LIPITOR) 80 MG tablet TAKE 1 TABLET BY MOUTH EVERY DAY 90 tablet 3  . buPROPion (WELLBUTRIN XL) 300 MG 24 hr tablet Take 300 mg by mouth daily.    . busPIRone (BUSPAR) 30 MG tablet Take 30 mg by mouth daily.     . carvedilol (COREG) 12.5 MG tablet Take 1 tablet (12.5 mg total) by mouth 2 (two) times daily. 60 tablet 11  . EPINEPHrine (EPIPEN IJ) Inject as directed as directed. Use as needed for anaphylaxis    . escitalopram (LEXAPRO) 20 MG tablet Take 20 mg by mouth daily.      Marland Kitchen esomeprazole (NEXIUM) 20 MG capsule Take 20 mg by mouth daily at 12 noon.    . Multiple Vitamins-Minerals (MULTIVITAMIN,TX-MINERALS) tablet Take 1 tablet by mouth daily.      . Multiple Vitamins-Minerals (OCUVITE PRESERVISION) TABS Take 1 tablet by mouth daily.     . nitroGLYCERIN (NITROSTAT) 0.4 MG SL tablet Place 1 tablet (0.4 mg total) under the tongue every 5 (five) minutes as needed for chest pain. 25 tablet 12  . valsartan (DIOVAN) 160 MG tablet Take 0.5 tablets by mouth 2 (two) times daily.  2  . zolpidem (AMBIEN) 10 MG tablet Take 1 tablet by mouth at bedtime as needed for sleep.      No current facility-administered medications for this visit.      Past Medical History:  Diagnosis Date  . Adult ADHD   . AICD (automatic  cardioverter/defibrillator) present   . CAD (coronary artery disease)    Cath 2008, all grafts patent,   . 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 Carotid artery disease ([place])    Doppler, February, 2012, mild plaque, 0-39% bilateral, plan followup in one year  . Dyspepsia   . Ejection fraction < 50%   . Elevated PSA 02/15/2011  . Esophageal stricture   . GERD (gastroesophageal reflux disease)   . HTN (hypertension)   . Hx of CABG    1991  . Hyperlipidemia   . 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 Myocardial infarction ([place]) 1998  . Prostate cancer ([place]) 2012   IMRT, ADT  . Thrombosis of arm    Left  . Venous thrombosis    Left arm    ROS:   All systems reviewed and negative except as noted in the HPI.   Past Surgical History:  Procedure Laterality Date  . CARDIAC CATHETERIZATION  12/05/06  . CARDIAC CATHETERIZATION [name] 12/21/2015   Procedure: Left Heart Cath and Coronary Angiography;  Surgeon: Lorretta Harp, MD;  Location: [facility];  Service: Cardiovascular;  Laterality: [name];  . CORONARY ARTERY BYPASS GRAFT     LIMA_LAD, SVG-OM, Cx, RCA '91  . CYSTECTOMY     Hand  . 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 ICD Placement    . IMPLANTABLE CARDIOVERTER DEFIBRILLATOR (ICD) GENERATOR CHANGE  04/06/2015   Procedure: Icd Generator Change;  Surgeon: Evans Lance, MD;  Location: [facility];  Service: Cardiovascular;;  . menistectomies, left knee  '09  . ROTATOR CUFF REPAIR  2005, 2007  . Thumb Surgery    . TONSILLECTOMY  2005   Malignant lesion, w/XRT     Family History  Problem Relation Age of Onset  . Alzheimer's disease Mother   . Dementia Mother        living in [place]  . Hyperlipidemia Father   .  Hypertension Father   . CAD Father        had pacemaker  . Congestive Heart Failure Father   . Heart disease Brother        Bypass surgery  . Breast cancer Sister        survivor  . Colon cancer Neg Hx   . Prostate cancer Neg Hx   . Diabetes Neg Hx      Social History   Social History  . Marital status: Married    Spouse name: [name]  . Number of children: 0  . Years of education: 12   Occupational History  . Retired Retired    Contractor for [employer]  . Smoking status: Never Smoker  . Smokeless tobacco: Never Used  . Alcohol use No  . Drug use: No  . Sexual activity: Yes    Birth control/ protection: None   Other Topics Concern  . Not on file   Social History Narrative   [employer]. 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 Marriage in good health, wife in good health. ACP discussed Jan '15: no CPR, no mechanical ventilation. He is referred to [email or web address] and provided with packet with HCPOA-his wife./ Advanced directive.   Fun/Hobby: Yard work, Architect working.      BP 128/84   Pulse (!) 57   Ht 6' (1.829 m)   Wt 186 lb 12.8 oz (84.7 kg)   SpO2 98%   BMI 25.33 kg/m   Physical Exam:  Well appearing 71 year old man, NAD HEENT: Unremarkable Neck:  6 cm JVD, no thyromegally Lymphatics:  No adenopathy Back:  No CVA tenderness Lungs:  Clear, with no wheezes, rales, or rhonchi HEART:  Regular rate rhythm, no murmurs, no rubs, no clicks Abd:  soft, positive bowel sounds, no organomegally, no rebound, no guarding Ext:  2 plus pulses, no edema, no cyanosis, no clubbing Skin:  No rashes no nodules Neuro:  CN II through XII intact, motor grossly intact   DEVICE  Normal device function.  See PaceArt for details.   Assess/Plan: 1. Chronic systolic heart failure - his symptoms remain class II. He is encouraged to maintain a low-sodium diet, and we will ask the patient to stop valsartan today and start  losartan. 2. Ventricular tachycardia - he has had no recurrent sustained VT. He has had one nonsustained episode which he did not have symptoms with. He will continue his current medical therapy. 3. ICD - his Medtronic single-chamber ICD is working normally. We'll recheck in several months.  Cristopher Peru, M.D.

## 2017-08-22 ENCOUNTER — Other Ambulatory Visit: Payer: Medicare Other

## 2017-08-22 DIAGNOSIS — I5022 Chronic systolic (congestive) heart failure: Secondary | ICD-10-CM

## 2017-08-22 DIAGNOSIS — I255 Ischemic cardiomyopathy: Secondary | ICD-10-CM

## 2017-08-22 DIAGNOSIS — I472 Ventricular tachycardia, unspecified: Secondary | ICD-10-CM

## 2017-08-22 LAB — BASIC METABOLIC PANEL
BUN / CREAT RATIO: 11 (ref 10–24)
BUN: 14 mg/dL (ref 8–27)
CO2: 24 mmol/L (ref 20–29)
CREATININE: 1.26 mg/dL (ref 0.76–1.27)
Calcium: 8.8 mg/dL (ref 8.6–10.2)
Chloride: 102 mmol/L (ref 96–106)
GFR, EST AFRICAN AMERICAN: 67 mL/min/{1.73_m2} (ref >59)
GFR, EST NON AFRICAN AMERICAN: 58 mL/min/{1.73_m2} — AB (ref >59)
GLUCOSE: 96 mg/dL (ref 65–99)
Potassium: 4 mmol/L (ref 3.5–5.2)
Sodium: 140 mmol/L (ref 134–144)

## 2017-10-06 DIAGNOSIS — Z8546 Personal history of malignant neoplasm of prostate: Secondary | ICD-10-CM | POA: Diagnosis not present

## 2017-10-06 LAB — PSA: PSA: 0.26

## 2017-10-10 ENCOUNTER — Ambulatory Visit (INDEPENDENT_AMBULATORY_CARE_PROVIDER_SITE_OTHER): Payer: Medicare Other | Admitting: *Deleted

## 2017-10-10 ENCOUNTER — Telehealth: Payer: Self-pay | Admitting: *Deleted

## 2017-10-10 DIAGNOSIS — I255 Ischemic cardiomyopathy: Secondary | ICD-10-CM | POA: Diagnosis not present

## 2017-10-10 NOTE — Telephone Encounter (Signed)
Alert printed for episode of VT treated with ATP x 2 on 10/05/17 @2027 . Patient denies any sx's of CP, ShOB, N/V, dizziness, or syncope during the time of the episode. Patient states that he's been taking all of his medications as Rx'd. Patient aware of [place] DMV driving restriction x 6 months. Will inform Dr.Taylor about episode and notify patient if anything further is recommended. Patient verbalized understanding.

## 2017-10-10 NOTE — Progress Notes (Signed)
Remote ICD transmission.   

## 2017-10-11 LAB — CUP PACEART REMOTE DEVICE CHECK
Battery Remaining Longevity: 121 mo
HIGH POWER IMPEDANCE MEASURED VALUE: 51 Ohm
HighPow Impedance: 42 Ohm
Implantable Lead Implant Date: 20080108
Implantable Lead Location: 753860
Implantable Lead Model: 6947
Implantable Pulse Generator Implant Date: 20160509
Lead Channel Pacing Threshold Amplitude: 0.875 V
Lead Channel Pacing Threshold Pulse Width: 0.4 ms
Lead Channel Setting Pacing Amplitude: 2.5 V
Lead Channel Setting Pacing Pulse Width: 0.4 ms
Lead Channel Setting Sensing Sensitivity: 0.3 mV
MDC IDC MSMT BATTERY VOLTAGE: 3.01 V
MDC IDC MSMT LEADCHNL RV IMPEDANCE VALUE: 304 Ohm
MDC IDC MSMT LEADCHNL RV IMPEDANCE VALUE: 361 Ohm
MDC IDC MSMT LEADCHNL RV SENSING INTR AMPL: 23.875 mV
MDC IDC MSMT LEADCHNL RV SENSING INTR AMPL: 23.875 mV
MDC IDC SESS DTM: 20181113093823
MDC IDC STAT BRADY RV PERCENT PACED: 0.7 %

## 2017-10-13 ENCOUNTER — Encounter: Payer: Self-pay | Admitting: Cardiology

## 2017-10-13 DIAGNOSIS — Z8546 Personal history of malignant neoplasm of prostate: Secondary | ICD-10-CM | POA: Diagnosis not present

## 2017-10-13 DIAGNOSIS — N5201 Erectile dysfunction due to arterial insufficiency: Secondary | ICD-10-CM | POA: Diagnosis not present

## 2017-10-23 ENCOUNTER — Other Ambulatory Visit: Payer: Self-pay | Admitting: Cardiovascular Disease

## 2017-11-09 ENCOUNTER — Encounter: Payer: Self-pay | Admitting: Internal Medicine

## 2017-11-09 DIAGNOSIS — F411 Generalized anxiety disorder: Secondary | ICD-10-CM | POA: Diagnosis not present

## 2017-11-09 DIAGNOSIS — F900 Attention-deficit hyperactivity disorder, predominantly inattentive type: Secondary | ICD-10-CM | POA: Diagnosis not present

## 2017-11-09 DIAGNOSIS — F422 Mixed obsessional thoughts and acts: Secondary | ICD-10-CM | POA: Diagnosis not present

## 2017-11-09 DIAGNOSIS — F331 Major depressive disorder, recurrent, moderate: Secondary | ICD-10-CM | POA: Diagnosis not present

## 2017-11-09 NOTE — Progress Notes (Signed)
Abstracted and sent to scan  

## 2017-12-14 ENCOUNTER — Ambulatory Visit: Payer: Medicare Other | Admitting: Internal Medicine

## 2017-12-15 ENCOUNTER — Ambulatory Visit: Payer: Medicare Other | Admitting: Internal Medicine

## 2017-12-15 ENCOUNTER — Encounter: Payer: Self-pay | Admitting: Internal Medicine

## 2017-12-15 DIAGNOSIS — J3089 Other allergic rhinitis: Secondary | ICD-10-CM

## 2017-12-15 MED ORDER — FLUTICASONE PROPIONATE 50 MCG/ACT NA SUSP: 2.0000 | Freq: Every day | NASAL | 6 refills | Status: DC

## 2017-12-15 NOTE — Progress Notes (Signed)
   Subjective:    Patient ID: [name], male    DOB: 1946/07/24, 72 y.o.   MRN: [id number]  HPI The patient is a 72 YO man coming in for sinus problems. Going on since the summer. He has brown mucus and sinus headaches (none since September). He is currently having nasal congestion and drainage. Denies fevers or chills. He has not tried taking anything at this time. Overall stable lately to mildly improved.   Review of Systems  Constitutional: Negative for activity change, appetite change, chills, fatigue, fever and unexpected weight change.  HENT: Positive for congestion, postnasal drip and rhinorrhea. Negative for ear discharge, ear pain, sinus pressure, sinus pain, sneezing, sore throat, tinnitus, trouble swallowing and voice change.   Eyes: Negative.   Respiratory: Negative for cough, chest tightness, shortness of breath and wheezing.   Cardiovascular: Negative.   Gastrointestinal: Negative.   Musculoskeletal: Negative for myalgias.  Neurological: Negative.       Objective:   Physical Exam  Constitutional: He is oriented to person, place, and time. He appears well-developed and well-nourished.  HENT:  Head: Normocephalic and atraumatic.  Oropharynx with redness and clear drainage. No sinus pressure.   Eyes: EOM are normal.  Neck: Normal range of motion.  Cardiovascular: Normal rate and regular rhythm.  Pulmonary/Chest: Effort normal and breath sounds normal. No respiratory distress. He has no wheezes. He has no rales.  Abdominal: Soft. Bowel sounds are normal. He exhibits no distension. There is no tenderness. There is no rebound.  Musculoskeletal: He exhibits no edema.  Neurological: He is alert and oriented to person, place, and time. Coordination normal.  Skin: Skin is warm and dry.  Psychiatric: He has a normal mood and affect.   Vitals:   12/15/17 0935  BP: 130/90  Pulse: 66  Temp: 98.6 F (37 C)  TempSrc: Oral  SpO2: 99%  Weight: 185 lb (83.9 kg)    Height: 6' (1.829 m)      Assessment & Plan:

## 2017-12-15 NOTE — Assessment & Plan Note (Signed)
No signs of acute infection today. Rx for flonase to help with symptoms and encouraged zyrtec or saline nose spray as well.

## 2017-12-15 NOTE — Patient Instructions (Signed)
We have sent in flonase for the sinuses. Take 2 sprays in each nostril once a day for the next 1-2 weeks to help the drainage.

## 2018-01-09 ENCOUNTER — Ambulatory Visit (INDEPENDENT_AMBULATORY_CARE_PROVIDER_SITE_OTHER): Payer: Medicare Other | Admitting: *Deleted

## 2018-01-09 DIAGNOSIS — I255 Ischemic cardiomyopathy: Secondary | ICD-10-CM

## 2018-01-09 NOTE — Progress Notes (Signed)
Remote ICD transmission.   

## 2018-01-10 ENCOUNTER — Encounter: Payer: Self-pay | Admitting: Cardiology

## 2018-01-19 LAB — CUP PACEART REMOTE DEVICE CHECK
Battery Remaining Longevity: 119 mo
Date Time Interrogation Session: 20190212052203
HighPow Impedance: 43 Ohm
HighPow Impedance: 54 Ohm
Implantable Lead Model: 6947
Implantable Pulse Generator Implant Date: 20160509
Lead Channel Pacing Threshold Amplitude: 1 V
Lead Channel Pacing Threshold Pulse Width: 0.4 ms
Lead Channel Sensing Intrinsic Amplitude: 21 mV
Lead Channel Setting Pacing Amplitude: 2 V
Lead Channel Setting Pacing Pulse Width: 0.4 ms
MDC IDC LEAD IMPLANT DT: 20080108
MDC IDC LEAD LOCATION: 753860
MDC IDC MSMT BATTERY VOLTAGE: 3.01 V
MDC IDC MSMT LEADCHNL RV IMPEDANCE VALUE: 285 Ohm
MDC IDC MSMT LEADCHNL RV IMPEDANCE VALUE: 361 Ohm
MDC IDC MSMT LEADCHNL RV SENSING INTR AMPL: 21 mV
MDC IDC SET LEADCHNL RV SENSING SENSITIVITY: 0.3 mV
MDC IDC STAT BRADY RV PERCENT PACED: 0.75 %

## 2018-01-22 ENCOUNTER — Other Ambulatory Visit: Payer: Self-pay | Admitting: Cardiovascular Disease

## 2018-01-23 ENCOUNTER — Other Ambulatory Visit: Payer: Self-pay | Admitting: Cardiovascular Disease

## 2018-01-29 ENCOUNTER — Encounter: Payer: Self-pay | Admitting: Internal Medicine

## 2018-02-19 ENCOUNTER — Other Ambulatory Visit: Payer: Self-pay | Admitting: Cardiovascular Disease

## 2018-02-21 ENCOUNTER — Encounter: Payer: Self-pay | Admitting: Internal Medicine

## 2018-02-26 ENCOUNTER — Encounter: Payer: Self-pay | Admitting: Internal Medicine

## 2018-02-26 ENCOUNTER — Ambulatory Visit: Payer: Medicare Other | Admitting: Internal Medicine

## 2018-02-26 DIAGNOSIS — J3089 Other allergic rhinitis: Secondary | ICD-10-CM

## 2018-02-26 MED ORDER — MONTELUKAST SODIUM 10 MG PO TABS: 10.0000 mg | ORAL_TABLET | Freq: Every day | ORAL | 3 refills | Status: DC

## 2018-02-26 NOTE — Assessment & Plan Note (Signed)
Rx for singulair to add to flonase for better control. Offered ENT or allergy referral which they will think about and let us know if interested.

## 2018-02-26 NOTE — Patient Instructions (Signed)
We have sent in the singulair to see if this helps.

## 2018-02-26 NOTE — Progress Notes (Signed)
   Subjective:    Patient ID: [name], male    DOB: Apr 30, 1946, 72 y.o.   MRN: [id number]  HPI The patient is a 72 YO man coming in for follow up of nose drainage. He is still having right nose discharge of brown sticky material since last May. Started on flonase about 2 months ago and this has helped some lasts only hours instead of all day. He is not taking anything else for it. We had advised sinus rinse on mychart but he has not tried this. Denies sinus pressure or pain. Denies headaches or migraines. Denies sinus pressure. Denies ear pain, drainage, hearing changes, vision or eye problems. Denies cough or SOB. Overall symptoms are stable. Denies nosebleed. Denies dental problem or infection.   Review of Systems  Constitutional: Negative for activity change, appetite change, chills, fatigue, fever and unexpected weight change.  HENT: Positive for congestion, postnasal drip and rhinorrhea. Negative for dental problem, drooling, ear discharge, ear pain, mouth sores, nosebleeds, sinus pressure, sinus pain, sneezing, sore throat, tinnitus, trouble swallowing and voice change.   Eyes: Negative.   Respiratory: Negative for cough, chest tightness, shortness of breath and wheezing.   Cardiovascular: Negative.   Gastrointestinal: Negative.   Neurological: Negative.       Objective:   Physical Exam  Constitutional: He is oriented to person, place, and time. He appears well-developed and well-nourished.  HENT:  Head: Normocephalic and atraumatic.  Oropharynx with redness and clear drainage, nose with swollen turbinates, TMs normal bilaterally  Eyes: Pupils are equal, round, and reactive to light. EOM are normal.  Neck: Normal range of motion. No thyromegaly present.  Cardiovascular: Normal rate and regular rhythm.  Pulmonary/Chest: Effort normal and breath sounds normal. No respiratory distress. He has no wheezes. He has no rales.  Abdominal: Soft.  Musculoskeletal: He exhibits no  tenderness.  Lymphadenopathy:    He has no cervical adenopathy.  Neurological: He is alert and oriented to person, place, and time.  Skin: Skin is warm and dry.   Vitals:   02/26/18 1402  BP: 140/84  Pulse: 60  Temp: 97.9 F (36.6 C)  TempSrc: Oral  SpO2: 98%  Weight: 185 lb (83.9 kg)  Height: 6' (1.829 m)      Assessment & Plan:

## 2018-02-27 ENCOUNTER — Encounter: Payer: Self-pay | Admitting: Cardiovascular Disease

## 2018-02-27 ENCOUNTER — Ambulatory Visit: Payer: Medicare Other | Admitting: Cardiovascular Disease

## 2018-02-27 VITALS — BP 126/86 | HR 65 | Ht 72.0 in | Wt 183.5 lb

## 2018-02-27 DIAGNOSIS — E7800 Pure hypercholesterolemia, unspecified: Secondary | ICD-10-CM | POA: Diagnosis not present

## 2018-02-27 DIAGNOSIS — I2510 Atherosclerotic heart disease of native coronary artery without angina pectoris: Secondary | ICD-10-CM | POA: Diagnosis not present

## 2018-02-27 DIAGNOSIS — I2583 Coronary atherosclerosis due to lipid rich plaque: Secondary | ICD-10-CM | POA: Diagnosis not present

## 2018-02-27 LAB — BASIC METABOLIC PANEL
BUN/Creatinine Ratio: 12 (ref 10–24)
BUN: 14 mg/dL (ref 8–27)
CALCIUM: 9.1 mg/dL (ref 8.6–10.2)
CO2: 24 mmol/L (ref 20–29)
CREATININE: 1.14 mg/dL (ref 0.76–1.27)
Chloride: 105 mmol/L (ref 96–106)
GFR calc Af Amer: 75 mL/min/{1.73_m2} (ref >59)
GFR, EST NON AFRICAN AMERICAN: 65 mL/min/{1.73_m2} (ref >59)
GLUCOSE: 94 mg/dL (ref 65–99)
POTASSIUM: 4.2 mmol/L (ref 3.5–5.2)
SODIUM: 145 mmol/L — AB (ref 134–144)

## 2018-02-27 LAB — LIPID PANEL
Chol/HDL Ratio: 2.3 ratio (ref 0.0–5.0)
Cholesterol, Total: 143 mg/dL (ref 100–199)
HDL: 61 mg/dL (ref >39)
LDL CALC: 62 mg/dL (ref 0–99)
Triglycerides: 100 mg/dL (ref 0–149)
VLDL CHOLESTEROL CAL: 20 mg/dL (ref 5–40)

## 2018-02-27 LAB — HEPATIC FUNCTION PANEL
ALBUMIN: 4 g/dL (ref 3.5–4.8)
ALK PHOS: 115 IU/L (ref 39–117)
ALT: 16 IU/L (ref 0–44)
AST: 14 IU/L (ref 0–40)
Bilirubin Total: 0.8 mg/dL (ref 0.0–1.2)
Bilirubin, Direct: 0.21 mg/dL (ref 0.00–0.40)
TOTAL PROTEIN: 6.5 g/dL (ref 6.0–8.5)

## 2018-02-27 MED ORDER — ATORVASTATIN CALCIUM 80 MG PO TABS: 80.0000 mg | ORAL_TABLET | Freq: Every day | ORAL | 3 refills | Status: DC

## 2018-02-27 NOTE — Progress Notes (Signed)
Cardiology Office Note   Date:  02/27/2018   ID:  GARRETT MITCHUM, DOB [birth year], MRN [id number]  PCP:  Hoyt Koch, MD  Cardiologist:  Mertie Moores, MD , previous Ron Parker patient   Chief Complaint  Patient presents with  . Coronary Artery Disease   Problem List 1. CAD 2. Chronic systolic CHF 3. Essential HTN 4. ICD  5. Hyperlipidemia    History of Present Illness: [name] is a 72 y.o. male who presents today to follow-up coronary disease and ischemic cardiomyopathy and hypertension. He recently had an ICD generator change. He has seen Dr. Lovena Le in follow-up. He has significant left ventricular dysfunction. He has not had an echo for many many years. He has been clinically stable. He is on the medications for CHF that he has been able to tolerate in the past. Because of complicated medicines for his adult ADHD, I have chosen not to push his medicines any further over time. Of course this can always be reconsidered going forward.  He's done very well over the years and he continues to do well. He is not having any chest pain or shortness of breath. When his ICD has been interrogated, he has not had any recurring ventricular tachycardia.  June 01, 2016:  [name] is seen today for follow up  Brought some BP readings.  Has been low at times.   His symptoms of orthostatic hypotension. These low blood pressure readings typically occur at midday. He takes his pressure pills at 10 AM. His primary medical doctor has reduced his Diovan dose from 160 mg to 80 mg a day. His symptoms have improved since that time.  February 10, 2017:  [name] is seen back today for follow-up of his coronary artery disease and HTN.  BP is elevated today  - just took meds an hour ago BP at home is good . No cP.   Able to do his normal activities.   February 27, 2018:   [name] is seen today for follow-up visit.  He has a history of coronary artery disease, coronary artery bypass  grafting, chronic systolic congestive heart failure, ICD , and hyperlipidemia.  He is able to go out and do his normal daily activities without any significant chest pain or shortness of breath.  Does not exercise per se but does do yard work and is active and otherwise.  Past Medical History:  Diagnosis Date  . Adult ADHD   . AICD (automatic cardioverter/defibrillator) present   . CAD (coronary artery disease)    Cath 2008, all grafts patent,   . 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 Carotid artery disease ([place])    Doppler, February, 2012, mild plaque, 0-39% bilateral, plan followup in one year  . Dyspepsia   . Ejection fraction < 50%   . Elevated PSA 02/15/2011  . Esophageal stricture   . GERD (gastroesophageal reflux disease)   . HTN (hypertension)   . Hx of CABG    1991  . Hyperlipidemia   . 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 Myocardial infarction ([place]) 1998  . Prostate cancer ([place]) 2012   IMRT, ADT  . Thrombosis of arm    Left  . Venous thrombosis    Left arm    Past  Surgical History:  Procedure Laterality Date  . CARDIAC CATHETERIZATION  12/05/06  . CARDIAC CATHETERIZATION N/A 12/21/2015   Procedure: Left Heart Cath and Coronary Angiography;  Surgeon: Lorretta Harp, MD;  Location: [facility];  Service: Cardiovascular;  Laterality: N/A;  . CORONARY ARTERY BYPASS GRAFT     LIMA_LAD, SVG-OM, Cx, RCA '91  . CYSTECTOMY     Hand  . 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 ICD Placement    . IMPLANTABLE CARDIOVERTER DEFIBRILLATOR (ICD) GENERATOR CHANGE  04/06/2015   Procedure: Icd Generator Change;  Surgeon: Evans Lance, MD;  Location: [facility];  Service: Cardiovascular;;  . menistectomies, left knee   '09  . ROTATOR CUFF REPAIR  2005, 2007  . Thumb Surgery    . TONSILLECTOMY  2005   Malignant lesion, w/XRT    Patient Active Problem List   Diagnosis Date Noted  . Chronic systolic CHF (congestive heart failure) ([place]) 12/30/2015    Priority: High  . CAD (coronary artery disease)     Priority: High  . Medicare annual wellness visit, subsequent 05/19/2017  . Allergic rhinitis 05/05/2017  . Orthostatic hypotension 05/11/2016  . Elevated LFTs 03/24/2016  . Impaired fasting blood sugar 01/10/2016  . Epigastric pain 12/21/2015  . Atypical chest pain 12/21/2015  . ST elevation myocardial infarction (STEMI) ([place])   . Hx of CABG   . Adult ADHD   . Ejection fraction < 50%   . ICD (implantable cardioverter-defibrillator) in place 03/10/2015  . Ventricular tachycardia ([place]) 03/05/2014  . Routine health maintenance 12/11/2013  . Advanced care planning/counseling discussion 12/10/2013  . Cardiomyopathy, ischemic   . Essential hypertension   . Hyperlipidemia   . Encounter for servicing of automatic implantable cardioverter-defibrillator (AICD) at end of battery life   . Carotid artery disease ([place])   . Prostate cancer ([place]) 11/09/2011  . 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 ESOPHAGEAL STRICTURE 08/23/2008      Current Outpatient Medications  Medication Sig Dispense Refill  . aspirin 81 MG EC tablet Take 81 mg by mouth daily.      Marland Kitchen atorvastatin (LIPITOR) 80 MG tablet Take 1 tablet (80 mg total) by mouth daily at 6 PM. Please make overdue yearly appt with Dr.Ted Leonhart before anymore refills. 2nd attempt 15 tablet 0  . buPROPion (WELLBUTRIN XL) 300 MG 24 hr tablet Take 300 mg by mouth daily.    . busPIRone (BUSPAR) 30 MG tablet Take 30 mg by mouth daily.     . carvedilol (COREG) 12.5 MG tablet Take 1 tablet (12.5 mg total) by mouth 2 (two) times daily. 60 tablet 11  . EPINEPHrine (EPIPEN IJ) Inject as directed as directed. Use as needed for anaphylaxis    . escitalopram (LEXAPRO) 20  MG tablet Take 20 mg by mouth daily.      Marland Kitchen esomeprazole (NEXIUM) 20 MG packet Take 20 mg by mouth daily before breakfast.    . fluticasone (FLONASE) 50 MCG/ACT nasal spray Place 2 sprays into both nostrils daily. 16 g 6  . losartan (COZAAR) 50 MG tablet Take 50 mg by mouth daily.    . montelukast (SINGULAIR) 10 MG tablet Take 1 tablet (10 mg total) by mouth at bedtime. 30 tablet 3  . Multiple Vitamins-Minerals (MULTIVITAMIN,TX-MINERALS) tablet Take 1 tablet by mouth daily.      . Multiple Vitamins-Minerals (OCUVITE PRESERVISION) TABS Take 1 tablet by mouth daily.     Marland Kitchen  nitroGLYCERIN (NITROSTAT) 0.4 MG SL tablet Place 1 tablet (0.4 mg total) under the tongue every 5 (five) minutes as needed for chest pain. 25 tablet 12  . zolpidem (AMBIEN) 10 MG tablet Take 1 tablet by mouth at bedtime as needed for sleep.      No current facility-administered medications for this visit.     Allergies:   Other    Social History:  The patient  reports that he has never smoked. He has never used smokeless tobacco. He reports that he does not drink alcohol or use drugs.   Family History:  The patient's family history includes Alzheimer's disease in his mother; Breast cancer in his sister; CAD in his father; Congestive Heart Failure in his father; Dementia in his mother; Heart disease in his brother; Hyperlipidemia in his father; Hypertension in his father.    ROS: Noted in current history.  Otherwise review of systems is negative.  Physical Exam: Blood pressure 126/86, pulse 65, height 6' (1.829 m), weight 183 lb 8 oz (83.2 kg).  GEN:  Thin, middle age man, NAD  HEENT: Normal NECK: No JVD; No carotid bruits LYMPHATICS: No lymphadenopathy CARDIAC: RR,  no murmurs, rubs, gallops RESPIRATORY:  Clear to auscultation without rales, wheezing or rhonchi  ABDOMEN: Soft, non-tender, non-distended MUSCULOSKELETAL:  No edema; No deformity  SKIN: Warm and dry NEUROLOGIC:  Alert and oriented x 3  EKG:   February 27, 2018: Normal sinus rhythm.  He has a nonspecific intraventricular conduction delay.  Recent Labs: 08/22/2017: BUN 14; Creatinine, Ser 1.26; Potassium 4.0; Sodium 140    Lipid Panel    Component Value Date/Time   CHOL 168 02/10/2017 1000   TRIG 138 02/10/2017 1000   HDL 60 02/10/2017 1000   CHOLHDL 2.8 02/10/2017 1000   CHOLHDL 2.5 11/01/2016 1100   VLDL 27 11/01/2016 1100   LDLCALC 80 02/10/2017 1000      Wt Readings from Last 3 Encounters:  02/27/18 183 lb 8 oz (83.2 kg)  02/26/18 185 lb (83.9 kg)  12/15/17 185 lb (83.9 kg)     Current medicines are reviewed  The patient understands his medications.   ASSESSMENT AND PLAN:  1. CAD -  His native coronary arteries are completely occluded. He had patent LIMA to the LAD and saphenous vein graft to the marginal system and to the RCA.  Remains very stable.  He has not had any episodes of angina.  Continue current medications.  We will refill his atorvastatin 80 mg - 90-day supply. Advised him to start walking 3-4 days a week for 2-3 miles each day   We will have him follow-up with Pecolia Ades, nurse practitioner in 1 year.  He will call sooner if needed.    2. Chronic systolic CHF -    Stable ,   Check BMP, liver enz, lipids today  . 3. Essential HTN -   4. ICD  -  Followed by EP   5. Hyperlipidemia -  . Check fasting labs today   Will have him return to see Pecolia Ades, NP in 1 year     Mertie Moores, MD  02/27/2018 11:28 AM    [address],  [address] [place], [place]  [address] Pager [phone] Phone: [phone]; Fax: [phone]     \

## 2018-02-27 NOTE — Patient Instructions (Signed)
Medication Instructions:  Your physician recommends that you continue on your current medications as directed. Please refer to the Current Medication list given to you today.  Labwork: Liver, lipid and BMET today  Testing/Procedures: None  Follow-Up: Your physician wants you to follow-up in: 1 year with Pecolia Ades, NP.  You will receive a reminder letter in the mail two months in advance. If you don't receive a letter, please call our office to schedule the follow-up appointment.   Any Other Special Instructions Will Be Listed Below (If Applicable).     If you need a refill on your cardiac medications before your next appointment, please call your pharmacy.

## 2018-04-04 ENCOUNTER — Ambulatory Visit (INDEPENDENT_AMBULATORY_CARE_PROVIDER_SITE_OTHER): Payer: Medicare Other | Admitting: Ophthalmology

## 2018-04-04 DIAGNOSIS — H33302 Unspecified retinal break, left eye: Secondary | ICD-10-CM | POA: Diagnosis not present

## 2018-04-04 DIAGNOSIS — D3132 Benign neoplasm of left choroid: Secondary | ICD-10-CM

## 2018-04-04 DIAGNOSIS — H353132 Nonexudative age-related macular degeneration, bilateral, intermediate dry stage: Secondary | ICD-10-CM | POA: Diagnosis not present

## 2018-04-04 DIAGNOSIS — H35033 Hypertensive retinopathy, bilateral: Secondary | ICD-10-CM

## 2018-04-04 DIAGNOSIS — H2513 Age-related nuclear cataract, bilateral: Secondary | ICD-10-CM

## 2018-04-04 DIAGNOSIS — H43813 Vitreous degeneration, bilateral: Secondary | ICD-10-CM

## 2018-04-04 DIAGNOSIS — I10 Essential (primary) hypertension: Secondary | ICD-10-CM | POA: Diagnosis not present

## 2018-04-10 ENCOUNTER — Ambulatory Visit (INDEPENDENT_AMBULATORY_CARE_PROVIDER_SITE_OTHER): Payer: Medicare Other | Admitting: *Deleted

## 2018-04-10 DIAGNOSIS — I255 Ischemic cardiomyopathy: Secondary | ICD-10-CM | POA: Diagnosis not present

## 2018-04-11 NOTE — Progress Notes (Signed)
Remote ICD transmission.   

## 2018-04-12 LAB — CUP PACEART REMOTE DEVICE CHECK
Battery Voltage: 3.01 V
Brady Statistic RV Percent Paced: 1.33 %
Date Time Interrogation Session: 20190514052403
HIGH POWER IMPEDANCE MEASURED VALUE: 43 Ohm
HighPow Impedance: 53 Ohm
Implantable Lead Model: 6947
Lead Channel Impedance Value: 304 Ohm
Lead Channel Impedance Value: 361 Ohm
Lead Channel Pacing Threshold Amplitude: 1 V
Lead Channel Sensing Intrinsic Amplitude: 20.375 mV
Lead Channel Sensing Intrinsic Amplitude: 20.375 mV
Lead Channel Setting Pacing Pulse Width: 0.4 ms
Lead Channel Setting Sensing Sensitivity: 0.3 mV
MDC IDC LEAD IMPLANT DT: 20080108
MDC IDC LEAD LOCATION: 753860
MDC IDC MSMT BATTERY REMAINING LONGEVITY: 117 mo
MDC IDC MSMT LEADCHNL RV PACING THRESHOLD PULSEWIDTH: 0.4 ms
MDC IDC PG IMPLANT DT: 20160509
MDC IDC SET LEADCHNL RV PACING AMPLITUDE: 2.25 V

## 2018-05-08 DIAGNOSIS — F411 Generalized anxiety disorder: Secondary | ICD-10-CM | POA: Diagnosis not present

## 2018-05-08 DIAGNOSIS — F331 Major depressive disorder, recurrent, moderate: Secondary | ICD-10-CM | POA: Diagnosis not present

## 2018-05-08 DIAGNOSIS — F422 Mixed obsessional thoughts and acts: Secondary | ICD-10-CM | POA: Diagnosis not present

## 2018-05-08 DIAGNOSIS — F900 Attention-deficit hyperactivity disorder, predominantly inattentive type: Secondary | ICD-10-CM | POA: Diagnosis not present

## 2018-06-17 ENCOUNTER — Encounter (HOSPITAL_COMMUNITY): Payer: Self-pay | Admitting: Emergency Medicine

## 2018-06-17 ENCOUNTER — Emergency Department (HOSPITAL_COMMUNITY)
Admission: EM | Admit: 2018-06-17 | Discharge: 2018-06-17 | Disposition: A | Discharge status: Home / Self Care | Payer: Medicare Other | Attending: Emergency Medicine | Admitting: Emergency Medicine

## 2018-06-17 DIAGNOSIS — Z7982 Long term (current) use of aspirin: Secondary | ICD-10-CM | POA: Insufficient documentation

## 2018-06-17 DIAGNOSIS — Z79899 Other long term (current) drug therapy: Secondary | ICD-10-CM | POA: Insufficient documentation

## 2018-06-17 DIAGNOSIS — Y9389 Activity, other specified: Secondary | ICD-10-CM | POA: Insufficient documentation

## 2018-06-17 DIAGNOSIS — S50861A Insect bite (nonvenomous) of right forearm, initial encounter: Secondary | ICD-10-CM | POA: Diagnosis not present

## 2018-06-17 DIAGNOSIS — Z85818 Personal history of malignant neoplasm of other sites of lip, oral cavity, and pharynx: Secondary | ICD-10-CM | POA: Diagnosis not present

## 2018-06-17 DIAGNOSIS — I2510 Atherosclerotic heart disease of native coronary artery without angina pectoris: Secondary | ICD-10-CM | POA: Diagnosis not present

## 2018-06-17 DIAGNOSIS — F909 Attention-deficit hyperactivity disorder, unspecified type: Secondary | ICD-10-CM | POA: Diagnosis not present

## 2018-06-17 DIAGNOSIS — I252 Old myocardial infarction: Secondary | ICD-10-CM | POA: Diagnosis not present

## 2018-06-17 DIAGNOSIS — Z8546 Personal history of malignant neoplasm of prostate: Secondary | ICD-10-CM | POA: Insufficient documentation

## 2018-06-17 DIAGNOSIS — I110 Hypertensive heart disease with heart failure: Secondary | ICD-10-CM | POA: Diagnosis not present

## 2018-06-17 DIAGNOSIS — W57XXXA Bitten or stung by nonvenomous insect and other nonvenomous arthropods, initial encounter: Secondary | ICD-10-CM | POA: Insufficient documentation

## 2018-06-17 DIAGNOSIS — R42 Dizziness and giddiness: Secondary | ICD-10-CM | POA: Diagnosis not present

## 2018-06-17 DIAGNOSIS — Y92007 Garden or yard of unspecified non-institutional (private) residence as the place of occurrence of the external cause: Secondary | ICD-10-CM | POA: Insufficient documentation

## 2018-06-17 DIAGNOSIS — I5022 Chronic systolic (congestive) heart failure: Secondary | ICD-10-CM | POA: Insufficient documentation

## 2018-06-17 DIAGNOSIS — Z95810 Presence of automatic (implantable) cardiac defibrillator: Secondary | ICD-10-CM | POA: Diagnosis not present

## 2018-06-17 DIAGNOSIS — Y998 Other external cause status: Secondary | ICD-10-CM | POA: Diagnosis not present

## 2018-06-17 DIAGNOSIS — R531 Weakness: Secondary | ICD-10-CM | POA: Diagnosis not present

## 2018-06-17 DIAGNOSIS — I959 Hypotension, unspecified: Secondary | ICD-10-CM | POA: Diagnosis not present

## 2018-06-17 DIAGNOSIS — Z951 Presence of aortocoronary bypass graft: Secondary | ICD-10-CM | POA: Diagnosis not present

## 2018-06-17 DIAGNOSIS — R0902 Hypoxemia: Secondary | ICD-10-CM | POA: Diagnosis not present

## 2018-06-17 DIAGNOSIS — T63484A Toxic effect of venom of other arthropod, undetermined, initial encounter: Secondary | ICD-10-CM | POA: Diagnosis not present

## 2018-06-17 DIAGNOSIS — T63461A Toxic effect of venom of wasps, accidental (unintentional), initial encounter: Secondary | ICD-10-CM

## 2018-06-17 DIAGNOSIS — T63441A Toxic effect of venom of bees, accidental (unintentional), initial encounter: Secondary | ICD-10-CM | POA: Diagnosis not present

## 2018-06-17 MED ORDER — EPINEPHRINE 0.3 MG/0.3ML IJ SOAJ: 0.3000 mg | INTRAMUSCULAR | 0 refills | Status: DC | PRN

## 2018-06-17 MED ORDER — FAMOTIDINE 20 MG PO TABS
40.0000 mg | ORAL_TABLET | Freq: Once | ORAL | Status: AC
  Administered 2018-06-17: 40 mg via ORAL
  Filled 2018-06-17: qty 2

## 2018-06-17 NOTE — ED Triage Notes (Signed)
Pt BIB [facility] for allergic reaction. Pt was outside when a swarm of bees came out of his wood pile and stung him. He went inside to get hornet spray and when he made it back out got the feeling that he could not stand any longer. Denies LOC and. Pt lowered himself to the ground. 125mg  solumedrol, 5mg  albuterol, and 50mg  benadryl PO administered via EMS

## 2018-06-17 NOTE — ED Provider Notes (Signed)
[facility] Provider Note   CSN: [id number] Arrival date & time: 06/17/18  1916     History   Chief Complaint Chief Complaint  Patient presents with  . Allergic Reaction    HPI HUDSYN CHAMPINE is a 72 y.o. male.  72 yo  M with a chief complaint of multiple bee stings.  The patient was arranging his woodpile at home when he knocked loose a nest of yellow jackets.  They started seeing him mostly to the right arm but also to the back of the left leg.  The patient had an episode where he felt very weak and 911 was called.  Per the family his systolic blood pressure was in the 60s.  He was given Solu-Medrol and Benadryl and was taken to the ED.  He feels back to normal currently.  Having pain with the bee stung him but denies shortness of breath wheezing throat closing vomiting diarrhea lightheadedness.  The history is provided by the patient.  Illness  This is a new problem. The current episode started yesterday. The problem occurs constantly. Pertinent negatives include no chest pain, no abdominal pain, no headaches and no shortness of breath. Nothing aggravates the symptoms. Nothing relieves the symptoms. He has tried nothing for the symptoms. The treatment provided no relief.    Past Medical History:  Diagnosis Date  . Adult ADHD   . AICD (automatic cardioverter/defibrillator) present   . CAD (coronary artery disease)    Cath 2008, all grafts patent,   . 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 Carotid artery disease ([place])    Doppler, February, 2012, mild plaque, 0-39% bilateral, plan followup in one year  . Dyspepsia   . Ejection fraction < 50%   . Elevated PSA 02/15/2011  . Esophageal stricture   . GERD (gastroesophageal reflux disease)   . HTN (hypertension)   . Hx of CABG    1991  . Hyperlipidemia   . ICD (implantable cardiac defibrillator) battery depletion      Placed 2008  (eepisodes  nonsustained monomorphic VT in the past)  . 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 Myocardial infarction ([place]) 1998  . Prostate cancer ([place]) 2012   IMRT, ADT  . Thrombosis of arm    Left  . Venous thrombosis    Left arm    Patient Active Problem List   Diagnosis Date Noted  . Medicare annual wellness visit, subsequent 05/19/2017  . Allergic rhinitis 05/05/2017  . Orthostatic hypotension 05/11/2016  . Elevated LFTs 03/24/2016  . Impaired fasting blood sugar 01/10/2016  . Chronic systolic CHF (congestive heart failure) ([place]) 12/30/2015  . Epigastric pain 12/21/2015  . Atypical chest pain 12/21/2015  . ST elevation myocardial infarction (STEMI) ([place])   . Hx of CABG   . Adult ADHD   . Ejection fraction < 50%   . ICD (implantable cardioverter-defibrillator) in place 03/10/2015  . Ventricular tachycardia ([place]) 03/05/2014  . Routine health maintenance 12/11/2013  . Advanced care planning/counseling discussion 12/10/2013  . Cardiomyopathy, ischemic   . CAD (coronary artery disease)   . Essential hypertension   . Hyperlipidemia   . Encounter for servicing of automatic implantable cardioverter-defibrillator (AICD) at end of battery life   . Carotid artery disease ([place])   . Prostate cancer ([place]) 11/09/2011  . 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 ESOPHAGEAL STRICTURE 08/23/2008    Past  Surgical History:  Procedure Laterality Date  . CARDIAC CATHETERIZATION  12/05/06  . CARDIAC CATHETERIZATION N/A 12/21/2015   Procedure: Left Heart Cath and Coronary Angiography;  Surgeon: Lorretta Harp, MD;  Location: [facility];  Service: Cardiovascular;  Laterality: N/A;  . CORONARY ARTERY BYPASS GRAFT     LIMA_LAD, SVG-OM, Cx, RCA '91  . CYSTECTOMY     Hand  . 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 ICD Placement    . IMPLANTABLE CARDIOVERTER DEFIBRILLATOR (ICD) GENERATOR CHANGE  04/06/2015    Procedure: Icd Generator Change;  Surgeon: Evans Lance, MD;  Location: [facility];  Service: Cardiovascular;;  . menistectomies, left knee  '09  . ROTATOR CUFF REPAIR  2005, 2007  . Thumb Surgery    . TONSILLECTOMY  2005   Malignant lesion, w/XRT        Home Medications    Prior to Admission medications   Medication Sig Start Date End Date Taking? Authorizing Provider  aspirin 81 MG EC tablet Take 81 mg by mouth daily.      [provider]  atorvastatin (LIPITOR) 80 MG tablet Take 1 tablet (80 mg total) by mouth daily at 6 PM. 02/27/18   Nahser, Wonda Cheng, MD  buPROPion (WELLBUTRIN XL) 300 MG 24 hr tablet Take 300 mg by mouth daily.    [provider]  busPIRone (BUSPAR) 30 MG tablet Take 30 mg by mouth daily.     [provider]  carvedilol (COREG) 12.5 MG tablet Take 1 tablet (12.5 mg total) by mouth 2 (two) times daily. 07/11/17   Nahser, Wonda Cheng, MD  EPINEPHrine (EPIPEN IJ) Inject as directed as directed. Use as needed for anaphylaxis    [provider]  escitalopram (LEXAPRO) 20 MG tablet Take 20 mg by mouth daily.      [provider]  esomeprazole (NEXIUM) 20 MG packet Take 20 mg by mouth daily before breakfast.    [provider]  fluticasone (FLONASE) 50 MCG/ACT nasal spray Place 2 sprays into both nostrils daily. 12/15/17   Hoyt Koch, MD  losartan (COZAAR) 50 MG tablet Take 50 mg by mouth daily.    [provider]  montelukast (SINGULAIR) 10 MG tablet Take 1 tablet (10 mg total) by mouth at bedtime. 02/26/18   Hoyt Koch, MD  Multiple Vitamins-Minerals (MULTIVITAMIN,TX-MINERALS) tablet Take 1 tablet by mouth daily.      [provider]  Multiple Vitamins-Minerals (OCUVITE PRESERVISION) TABS Take 1 tablet by mouth daily.     [provider]  nitroGLYCERIN (NITROSTAT) 0.4 MG SL tablet Place 1 tablet (0.4 mg total) under the tongue every 5 (five) minutes as needed for chest  pain. 12/23/15   Bhagat, Crista Luria, PA  zolpidem (AMBIEN) 10 MG tablet Take 1 tablet by mouth at bedtime as needed for sleep.  02/28/14   [provider]    Family History Family History  Problem Relation Age of Onset  . Alzheimer's disease Mother   . Dementia Mother        living in [place]  . Hyperlipidemia Father   . Hypertension Father   . CAD Father        had pacemaker  . Congestive Heart Failure Father   . Heart disease Brother        Bypass surgery  . Breast cancer Sister        survivor  . Colon  cancer Neg Hx   . Prostate cancer Neg Hx   . Diabetes Neg Hx     Social History Social History   Tobacco Use  . Smoking status: Never Smoker  . Smokeless tobacco: Never Used  Substance Use Topics  . Alcohol use: No  . Drug use: No     Allergies   Other   Review of Systems Review of Systems  Constitutional: Negative for chills and fever.  HENT: Negative for congestion and facial swelling.   Eyes: Negative for discharge and visual disturbance.  Respiratory: Negative for shortness of breath.   Cardiovascular: Negative for chest pain and palpitations.  Gastrointestinal: Negative for abdominal pain, diarrhea and vomiting.  Musculoskeletal: Negative for arthralgias and myalgias.  Skin: Positive for wound. Negative for color change and rash.  Neurological: Positive for weakness. Negative for tremors, syncope and headaches.  Psychiatric/Behavioral: Negative for confusion and dysphoric mood.     Physical Exam Updated Vital Signs BP (!) 141/81   Pulse 72   Temp 98.2 F (36.8 C) (Oral)   Resp 16   Ht 6' (1.829 m)   Wt 83.9 kg (185 lb)   SpO2 94%   BMI 25.09 kg/m   Physical Exam  Constitutional: He is oriented to person, place, and time. He appears well-developed and well-nourished.  HENT:  Head: Normocephalic and atraumatic.  Eyes: Pupils are equal, round, and reactive to light. EOM are normal.  Neck: Normal range of motion. Neck supple. No JVD  present.  Cardiovascular: Normal rate and regular rhythm. Exam reveals no gallop and no friction rub.  No murmur heard. Pulmonary/Chest: No respiratory distress. He has no wheezes.  Abdominal: He exhibits no distension. There is no rebound and no guarding.  Musculoskeletal: Normal range of motion.  Multiple pinpoint areas of erythema worse to the right upper extremity at the forearm, 1 to the left posterior calf.  This spares the trunk.  Neurological: He is alert and oriented to person, place, and time.  Skin: No rash noted. No pallor.  Psychiatric: He has a normal mood and affect. His behavior is normal.  Nursing note and vitals reviewed.    ED Treatments / Results  Labs (all labs ordered are listed, but only abnormal results are displayed) Labs Reviewed - No data to display  EKG EKG Interpretation  Date/Time:  Sunday June 17 2018 19:55:56 EDT Ventricular Rate:  68 PR Interval:    QRS Duration: 155 QT Interval:  469 QTC Calculation: 499 R Axis:   -58 Text Interpretation:  Sinus rhythm Short PR interval RBBB and LAFB LVH with secondary repolarization abnormality Anterior Q waves, possibly due to LVH No significant change since last tracing Confirmed by ,  ([id number]) on 06/17/2018 7:58:47 PM   Radiology No results found.  Procedures Procedures (including critical care time)  Medications Ordered in ED Medications  famotidine (PEPCID) tablet 40 mg (40 mg Oral Given 06/17/18 1959)     Initial Impression / Assessment and Plan / ED Course  I have reviewed the triage vital signs and the nursing notes.  Pertinent labs & imaging results that were available during my care of the patient were reviewed by me and considered in my medical decision making (see chart for details).     71  yo M with a chief complaint of being stung by multiple yellow jackets.  At some point the patient felt weak and was noted to be hypotensive.  He was not given epi in the field was given  Solu-Medrol  Benadryl with improvement.  Patient has no generalized rash only areas where individual bees have stung him.  He has no wheezing no vomiting or diarrhea no hypotension here.  I offered to observe the patient for 4 hours while waiting for the steroid to kick in.  The patient is electing for discharge home at this time.  The family is more worried about his heart not being able to handle the poison from the yellow jackets.  His ECG is unchanged from his most recent.  He is able to ambulate without any issue.  Discharge home.  8:43 PM:  I have discussed the diagnosis/risks/treatment options with the patient and family and believe the pt to be eligible for discharge home to follow-up with PCP. We also discussed returning to the ED immediately if new or worsening sx occur. We discussed the sx which are most concerning (e.g., sudden worsening pain, fever, inability to tolerate by mouth) that necessitate immediate return. Medications administered to the patient during their visit and any new prescriptions provided to the patient are listed below.  Medications given during this visit Medications  famotidine (PEPCID) tablet 40 mg (40 mg Oral Given 06/17/18 1959)     The patient appears reasonably screen and/or stabilized for discharge and I doubt any other medical condition or other [facility] requiring further screening, evaluation, or treatment in the ED at this time prior to discharge.    Final Clinical Impressions(s) / ED Diagnoses   Final diagnoses:  Yellow jacket sting, accidental or unintentional, initial encounter    ED Discharge Orders    None       Deno Etienne, DO 06/17/18 2048

## 2018-06-17 NOTE — ED Notes (Signed)
Patient ambulated in hall approx 32ft, denies dizziness or SHOB. Patient returned to room and resting in bed comfortably. WCTM.

## 2018-06-17 NOTE — Discharge Instructions (Signed)
Take tylenol 1000mg (2 extra strength) four times a day.  Take benadryl, 50 mg up to 4 times a day for pain and itching.

## 2018-06-17 NOTE — ED Notes (Signed)
Pt verbalized understanding of DC instructions and prescriptions. E-signature not available.

## 2018-07-02 ENCOUNTER — Other Ambulatory Visit: Payer: Self-pay | Admitting: Internal Medicine

## 2018-07-10 ENCOUNTER — Ambulatory Visit (INDEPENDENT_AMBULATORY_CARE_PROVIDER_SITE_OTHER): Payer: Medicare Other | Admitting: *Deleted

## 2018-07-10 DIAGNOSIS — I472 Ventricular tachycardia, unspecified: Secondary | ICD-10-CM

## 2018-07-10 DIAGNOSIS — I255 Ischemic cardiomyopathy: Secondary | ICD-10-CM

## 2018-07-10 NOTE — Progress Notes (Signed)
Remote ICD transmission.   

## 2018-08-03 LAB — CUP PACEART REMOTE DEVICE CHECK
Battery Voltage: 3.01 V
Brady Statistic RV Percent Paced: 1.02 %
HIGH POWER IMPEDANCE MEASURED VALUE: 42 Ohm
HighPow Impedance: 54 Ohm
Implantable Lead Model: 6947
Implantable Pulse Generator Implant Date: 20160509
Lead Channel Impedance Value: 285 Ohm
Lead Channel Impedance Value: 361 Ohm
Lead Channel Pacing Threshold Amplitude: 1 V
Lead Channel Setting Pacing Pulse Width: 0.4 ms
Lead Channel Setting Sensing Sensitivity: 0.3 mV
MDC IDC LEAD IMPLANT DT: 20080108
MDC IDC LEAD LOCATION: 753860
MDC IDC MSMT BATTERY REMAINING LONGEVITY: 113 mo
MDC IDC MSMT LEADCHNL RV PACING THRESHOLD PULSEWIDTH: 0.4 ms
MDC IDC MSMT LEADCHNL RV SENSING INTR AMPL: 21.125 mV
MDC IDC MSMT LEADCHNL RV SENSING INTR AMPL: 21.125 mV
MDC IDC SESS DTM: 20190813041704
MDC IDC SET LEADCHNL RV PACING AMPLITUDE: 2.5 V

## 2018-09-07 ENCOUNTER — Encounter: Payer: Self-pay | Admitting: Internal Medicine

## 2018-09-07 ENCOUNTER — Ambulatory Visit: Payer: Medicare Other | Admitting: Internal Medicine

## 2018-09-07 VITALS — BP 120/80 | HR 66 | Ht 72.0 in | Wt 178.8 lb

## 2018-09-07 DIAGNOSIS — I255 Ischemic cardiomyopathy: Secondary | ICD-10-CM

## 2018-09-07 DIAGNOSIS — I5022 Chronic systolic (congestive) heart failure: Secondary | ICD-10-CM | POA: Diagnosis not present

## 2018-09-07 DIAGNOSIS — I472 Ventricular tachycardia, unspecified: Secondary | ICD-10-CM

## 2018-09-07 DIAGNOSIS — Z95810 Presence of automatic (implantable) cardiac defibrillator: Secondary | ICD-10-CM | POA: Diagnosis not present

## 2018-09-07 LAB — CUP PACEART INCLINIC DEVICE CHECK
Battery Remaining Longevity: 109 mo
Battery Voltage: 3.01 V
Brady Statistic RV Percent Paced: 1.01 %
Date Time Interrogation Session: 20191011125341
HIGH POWER IMPEDANCE MEASURED VALUE: 41 Ohm
HIGH POWER IMPEDANCE MEASURED VALUE: 52 Ohm
Lead Channel Impedance Value: 361 Ohm
Lead Channel Pacing Threshold Pulse Width: 0.4 ms
Lead Channel Sensing Intrinsic Amplitude: 25.75 mV
Lead Channel Setting Pacing Amplitude: 2.25 V
MDC IDC LEAD IMPLANT DT: 20080108
MDC IDC LEAD LOCATION: 753860
MDC IDC MSMT LEADCHNL RV IMPEDANCE VALUE: 285 Ohm
MDC IDC MSMT LEADCHNL RV PACING THRESHOLD AMPLITUDE: 1 V
MDC IDC MSMT LEADCHNL RV SENSING INTR AMPL: 22.125 mV
MDC IDC PG IMPLANT DT: 20160509
MDC IDC SET LEADCHNL RV PACING PULSEWIDTH: 0.4 ms
MDC IDC SET LEADCHNL RV SENSING SENSITIVITY: 0.3 mV

## 2018-09-07 NOTE — Patient Instructions (Signed)
Medication Instructions:  Your physician recommends that you continue on your current medications as directed. Please refer to the Current Medication list given to you today.  Labwork: None ordered.  Testing/Procedures: None ordered.  Follow-Up: Your physician wants you to follow-up in: one year with Dr. Lovena Le.  You will receive a reminder letter in the mail two months in advance. If you don't receive a letter, please call our office to schedule the follow-up appointment.  Remote monitoring is used to monitor your ICD from home. This monitoring reduces the number of office visits required to check your device to one time per year. It allows [place] to keep an eye on the functioning of your device to ensure it is working properly. You are scheduled for a device check from home on 10/09/2018. You may send your transmission at any time that day. If you have a wireless device, the transmission will be sent automatically. After your physician reviews your transmission, you will receive a postcard with your next transmission date.  Any Other Special Instructions Will Be Listed Below (If Applicable).  If you need a refill on your cardiac medications before your next appointment, please call your pharmacy.

## 2018-09-07 NOTE — Progress Notes (Signed)
HPI [name] returns today for follow-up of chronic systolic heart failure, ventricular tachycardia, and an ischemic cardiomyopathy. He is a very pleasant 72 year old man with known ventricular tachycardia status post ICD insertion many years ago. He underwent generator change out approximately 3 years ago. He has had no intercurrent ICD shocks. He denies chest pain or shortness of breath. Allergies  Allergen Reactions  . Other Anaphylaxis    Insect bites (Has EpiPen)     Current Outpatient Medications  Medication Sig Dispense Refill  . aspirin 81 MG EC tablet Take 81 mg by mouth daily.      Marland Kitchen atorvastatin (LIPITOR) 80 MG tablet Take 1 tablet (80 mg total) by mouth daily at 6 PM. 90 tablet 3  . buPROPion (WELLBUTRIN XL) 300 MG 24 hr tablet Take 300 mg by mouth daily.    . busPIRone (BUSPAR) 30 MG tablet Take 30 mg by mouth daily.     . carvedilol (COREG) 12.5 MG tablet Take 1 tablet (12.5 mg total) by mouth 2 (two) times daily. 60 tablet 11  . EPINEPHrine 0.3 mg/0.3 mL IJ SOAJ injection Inject 0.3 mLs (0.3 mg total) into the muscle as needed (anaphylaxis). 2 Device 0  . escitalopram (LEXAPRO) 20 MG tablet Take 20 mg by mouth daily.      Marland Kitchen esomeprazole (NEXIUM) 20 MG packet Take 20 mg by mouth daily before breakfast.    . losartan (COZAAR) 50 MG tablet TAKE 1 TABLET(50 MG) BY MOUTH DAILY 30 tablet 8  . Multiple Vitamins-Minerals (MULTIVITAMIN,TX-MINERALS) tablet Take 1 tablet by mouth daily.      . Multiple Vitamins-Minerals (OCUVITE PRESERVISION) TABS Take 1 tablet by mouth daily.     . nitroGLYCERIN (NITROSTAT) 0.4 MG SL tablet Place 1 tablet (0.4 mg total) under the tongue every 5 (five) minutes as needed for chest pain. 25 tablet 12  . zolpidem (AMBIEN) 10 MG tablet Take 1 tablet by mouth at bedtime as needed for sleep.      No current facility-administered medications for this visit.      Past Medical History:  Diagnosis Date  . Adult ADHD   . AICD (automatic  cardioverter/defibrillator) present   . CAD (coronary artery disease)    Cath 2008, all grafts patent,   . 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 Carotid artery disease ([place])    Doppler, February, 2012, mild plaque, 0-39% bilateral, plan followup in one year  . Dyspepsia   . Ejection fraction < 50%   . Elevated PSA 02/15/2011  . Esophageal stricture   . GERD (gastroesophageal reflux disease)   . HTN (hypertension)   . Hx of CABG    1991  . Hyperlipidemia   . 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 Myocardial infarction ([place]) 1998  . Prostate cancer ([place]) 2012   IMRT, ADT  . Thrombosis of arm    Left  . Venous thrombosis    Left arm    ROS:   All systems reviewed and negative except as noted in the HPI.   Past Surgical History:  Procedure Laterality Date  . CARDIAC CATHETERIZATION  12/05/06  . CARDIAC CATHETERIZATION N/A 12/21/2015   Procedure: Left Heart Cath and Coronary Angiography;  Surgeon: Lorretta Harp, MD;  Location: [facility];  Service: Cardiovascular;  Laterality: N/A;  . CORONARY ARTERY BYPASS GRAFT     LIMA_LAD, SVG-OM, Cx, RCA '91  . CYSTECTOMY     Hand  . 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 ICD Placement    . IMPLANTABLE CARDIOVERTER DEFIBRILLATOR (ICD) GENERATOR CHANGE  04/06/2015   Procedure: Icd Generator Change;  Surgeon: Evans Lance, MD;  Location: [facility];  Service: Cardiovascular;;  . menistectomies, left knee  '09  . ROTATOR CUFF REPAIR  2005, 2007  . Thumb Surgery    . TONSILLECTOMY  2005   Malignant lesion, w/XRT     Family History  Problem Relation Age of Onset  . Alzheimer's disease Mother   . Dementia Mother        living in [place]  . Hyperlipidemia Father   .  Hypertension Father   . CAD Father        had pacemaker  . Congestive Heart Failure Father   . Heart disease Brother        Bypass surgery  . Breast cancer Sister        survivor  . Colon cancer Neg Hx   . Prostate cancer Neg Hx   . Diabetes Neg Hx      Social History   Socioeconomic History  . Marital status: Married    Spouse name: Not on file  . Number of children: 0  . Years of education: 7  . Highest education level: Not on file  Occupational History  . Occupation: Retired    Fish farm manager: RETIRED    Comment: Contractor for [employer]  . Financial resource strain: Not on file  . Food insecurity:    Worry: Not on file    Inability: Not on file  . Transportation needs:    Medical: Not on file    Non-medical: Not on file  Tobacco Use  . Smoking status: Never Smoker  . Smokeless tobacco: Never Used  Substance and Sexual Activity  . Alcohol use: No  . Drug use: No  . Sexual activity: Yes    Birth control/protection: None  Lifestyle  . Physical activity:    Days per week: Not on file    Minutes per session: Not on file  . Stress: Not on file  Relationships  . Social connections:    Talks on phone: Not on file    Gets together: Not on file    Attends religious service: Not on file    Active member of club or organization: Not on file    Attends meetings of clubs or organizations: Not on file    Relationship status: Not on file  . Intimate partner violence:    Fear of current or ex partner: Not on file    Emotionally abused: Not on file    Physically abused: Not on file    Forced sexual activity: Not on file  Other Topics Concern  . Not on file  Social History Narrative   [employer], [employer]. 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 Marriage in good health, wife in good health. ACP discussed Jan '15: no CPR, no mechanical ventilation. He is referred to [email or web address] and provided with packet with HCPOA-his wife./ Advanced directive.     Fun/Hobby: Yard work, Architect working.      BP 120/80   Pulse 66   Ht 6' (1.829 m)   [facility]  178 lb 12.8 oz (81.1 kg)   BMI 24.25 kg/m   Physical Exam:  Well appearing 72 yo man, NAD HEENT: Unremarkable Neck:  6 cm JVD, no thyromegally Lymphatics:  No adenopathy Back:  No CVA tenderness Lungs:  Clear with no wheezes HEART:  Regular rate rhythm, no murmurs, no rubs, no clicks Abd:  soft, positive bowel sounds, no organomegally, no rebound, no guarding Ext:  2 plus pulses, no edema, no cyanosis, no clubbing Skin:  No rashes no nodules Neuro:  CN II through XII intact, motor grossly intact   DEVICE  Normal device function.  See PaceArt for details.   Assess/Plan: 1. VT - he is maintaining NSR. He will continue his current meds. 2. ICD - his medtronic ICD is working normally. We will recheck in several months. 3. CAD - he denies anginal symptoms. He is encouraged to continue his current meds.  [email or web address].

## 2018-10-09 ENCOUNTER — Ambulatory Visit (INDEPENDENT_AMBULATORY_CARE_PROVIDER_SITE_OTHER): Payer: Medicare Other | Admitting: *Deleted

## 2018-10-09 DIAGNOSIS — I472 Ventricular tachycardia, unspecified: Secondary | ICD-10-CM

## 2018-10-09 DIAGNOSIS — I255 Ischemic cardiomyopathy: Secondary | ICD-10-CM

## 2018-10-09 NOTE — Progress Notes (Signed)
Remote ICD transmission.   

## 2018-10-11 ENCOUNTER — Other Ambulatory Visit: Payer: Self-pay | Admitting: Cardiovascular Disease

## 2018-10-11 DIAGNOSIS — I2583 Coronary atherosclerosis due to lipid rich plaque: Principal | ICD-10-CM

## 2018-10-11 DIAGNOSIS — I2510 Atherosclerotic heart disease of native coronary artery without angina pectoris: Secondary | ICD-10-CM

## 2018-10-11 DIAGNOSIS — I951 Orthostatic hypotension: Secondary | ICD-10-CM

## 2018-10-11 DIAGNOSIS — I5022 Chronic systolic (congestive) heart failure: Secondary | ICD-10-CM

## 2018-11-06 DIAGNOSIS — F331 Major depressive disorder, recurrent, moderate: Secondary | ICD-10-CM | POA: Diagnosis not present

## 2018-11-06 DIAGNOSIS — F422 Mixed obsessional thoughts and acts: Secondary | ICD-10-CM | POA: Diagnosis not present

## 2018-11-06 DIAGNOSIS — F411 Generalized anxiety disorder: Secondary | ICD-10-CM | POA: Diagnosis not present

## 2018-11-06 DIAGNOSIS — F900 Attention-deficit hyperactivity disorder, predominantly inattentive type: Secondary | ICD-10-CM | POA: Diagnosis not present

## 2018-12-09 LAB — CUP PACEART REMOTE DEVICE CHECK
Battery Remaining Longevity: 110 mo
HIGH POWER IMPEDANCE MEASURED VALUE: 42 Ohm
HighPow Impedance: 53 Ohm
Implantable Lead Implant Date: 20080108
Implantable Lead Model: 6947
Implantable Pulse Generator Implant Date: 20160509
Lead Channel Impedance Value: 361 Ohm
Lead Channel Pacing Threshold Amplitude: 1.375 V
Lead Channel Pacing Threshold Pulse Width: 0.4 ms
Lead Channel Setting Pacing Amplitude: 2.75 V
Lead Channel Setting Pacing Pulse Width: 0.4 ms
Lead Channel Setting Sensing Sensitivity: 0.3 mV
MDC IDC LEAD LOCATION: 753860
MDC IDC MSMT BATTERY VOLTAGE: 3.01 V
MDC IDC MSMT LEADCHNL RV IMPEDANCE VALUE: 304 Ohm
MDC IDC MSMT LEADCHNL RV SENSING INTR AMPL: 23 mV
MDC IDC MSMT LEADCHNL RV SENSING INTR AMPL: 23 mV
MDC IDC SESS DTM: 20191112052205
MDC IDC STAT BRADY RV PERCENT PACED: 0.09 %

## 2019-01-08 ENCOUNTER — Ambulatory Visit (INDEPENDENT_AMBULATORY_CARE_PROVIDER_SITE_OTHER): Payer: Medicare Other

## 2019-01-08 DIAGNOSIS — I255 Ischemic cardiomyopathy: Secondary | ICD-10-CM

## 2019-01-08 DIAGNOSIS — I472 Ventricular tachycardia, unspecified: Secondary | ICD-10-CM

## 2019-01-08 LAB — CUP PACEART REMOTE DEVICE CHECK
Battery Remaining Longevity: 105 mo
Battery Voltage: 3.01 V
Date Time Interrogation Session: 20200211072723
HIGH POWER IMPEDANCE MEASURED VALUE: 57 Ohm
HighPow Impedance: 43 Ohm
Implantable Lead Implant Date: 20080108
Lead Channel Impedance Value: 304 Ohm
Lead Channel Pacing Threshold Amplitude: 1.5 V
Lead Channel Sensing Intrinsic Amplitude: 22.125 mV
Lead Channel Sensing Intrinsic Amplitude: 22.125 mV
Lead Channel Setting Sensing Sensitivity: 0.3 mV
MDC IDC LEAD LOCATION: 753860
MDC IDC MSMT LEADCHNL RV IMPEDANCE VALUE: 399 Ohm
MDC IDC MSMT LEADCHNL RV PACING THRESHOLD PULSEWIDTH: 0.4 ms
MDC IDC PG IMPLANT DT: 20160509
MDC IDC SET LEADCHNL RV PACING AMPLITUDE: 3 V
MDC IDC SET LEADCHNL RV PACING PULSEWIDTH: 0.4 ms
MDC IDC STAT BRADY RV PERCENT PACED: 0.06 %

## 2019-01-21 NOTE — Progress Notes (Signed)
Remote ICD transmission.   

## 2019-03-01 ENCOUNTER — Other Ambulatory Visit: Payer: Self-pay | Admitting: Cardiovascular Disease

## 2019-03-05 ENCOUNTER — Telehealth: Payer: Self-pay | Admitting: *Deleted

## 2019-03-05 ENCOUNTER — Telehealth (INDEPENDENT_AMBULATORY_CARE_PROVIDER_SITE_OTHER): Payer: Medicare Other | Admitting: Physician Assistant

## 2019-03-05 ENCOUNTER — Other Ambulatory Visit: Payer: Self-pay

## 2019-03-05 ENCOUNTER — Encounter: Payer: Self-pay | Admitting: Physician Assistant

## 2019-03-05 VITALS — BP 138/80 | HR 67 | Ht 72.0 in | Wt 180.0 lb

## 2019-03-05 DIAGNOSIS — Z7189 Other specified counseling: Secondary | ICD-10-CM

## 2019-03-05 DIAGNOSIS — I110 Hypertensive heart disease with heart failure: Secondary | ICD-10-CM | POA: Diagnosis not present

## 2019-03-05 DIAGNOSIS — E7800 Pure hypercholesterolemia, unspecified: Secondary | ICD-10-CM | POA: Diagnosis not present

## 2019-03-05 DIAGNOSIS — I5022 Chronic systolic (congestive) heart failure: Secondary | ICD-10-CM | POA: Diagnosis not present

## 2019-03-05 DIAGNOSIS — I10 Essential (primary) hypertension: Secondary | ICD-10-CM

## 2019-03-05 DIAGNOSIS — I472 Ventricular tachycardia, unspecified: Secondary | ICD-10-CM

## 2019-03-05 DIAGNOSIS — I2510 Atherosclerotic heart disease of native coronary artery without angina pectoris: Secondary | ICD-10-CM

## 2019-03-05 DIAGNOSIS — Z95810 Presence of automatic (implantable) cardiac defibrillator: Secondary | ICD-10-CM

## 2019-03-05 NOTE — Telephone Encounter (Signed)
Virtual Visit Pre-Appointment Phone Call  Steps For Call:  1. Confirm consent - "In the setting of the current Covid19 crisis, you are scheduled for a (phone or video) visit with your provider on (date) at (time).  Just as we do with many in-office visits, in order for you to participate in this visit, we must obtain consent.  If you'd like, I can send this to your mychart (if signed up) or email for you to review.  Otherwise, I can obtain your verbal consent now.  All virtual visits are billed to your insurance company just like a normal visit would be.  By agreeing to a virtual visit, we'd like you to understand that the technology does not allow for your provider to perform an examination, and thus may limit your provider's ability to fully assess your condition.  Finally, though the technology is pretty good, we cannot assure that it will always work on either your or our end, and in the setting of a video visit, we may have to convert it to a phone-only visit.  In either situation, we cannot ensure that we have a secure connection.  Are you willing to proceed?"  2. Give patient instructions for WebEx download to smartphone as below if video visit  3. Advise patient to be prepared with any vital sign or heart rhythm information, their current medicines, and a piece of paper and pen handy for any instructions they may receive the day of their visit  4. Inform patient they will receive a phone call 15 minutes prior to their appointment time (may be from unknown caller ID) so they should be prepared to answer  5. Confirm that appointment type is correct in Epic appointment notes (video vs telephone)    TELEPHONE CALL NOTE  [name] has been deemed a candidate for a follow-up tele-health visit to limit community exposure during the Covid-19 pandemic. I spoke with the patient via phone to ensure availability of phone/video source, confirm preferred email & phone number, and discuss  instructions and expectations.  I reminded WORLEY RADERMACHER to be prepared with any vital sign and/or heart rhythm information that could potentially be obtained via home monitoring, at the time of his visit. I reminded CAMBREN HELM to expect a phone call at the time of his visit if his visit.  Did the patient verbally acknowledge consent to treatment? Yes   [place], [place] 03/05/2019 11:05 AM   DOWNLOADING THE WEBEX SOFTWARE TO SMARTPHONE  - If Apple, go to [employer] and type in WebEx in the search bar. [place] [employer], the blue/green circle. The app is free but as with any other app downloads, their phone may require them to verify saved payment information or Apple password. The patient does NOT have to create an account.  - If Android, ask patient to go to [employer] and type in WebEx in the search bar. [place] [employer], the blue/green circle. The app is free but as with any other app downloads, their phone may require them to verify saved payment information or Android password. The patient does NOT have to create an account.   CONSENT FOR TELE-HEALTH VISIT - PLEASE REVIEW  I hereby voluntarily request, consent and authorize [facility] and its employed or contracted physicians, physician assistants, nurse practitioners or other licensed health care professionals (the Practitioner), to provide me with telemedicine health care services (the "Services") as deemed necessary by the treating Practitioner. I  acknowledge and consent to receive the Services by the Practitioner via telemedicine. I understand that the telemedicine visit will involve communicating with the Practitioner through live audiovisual communication technology and the disclosure of certain medical information by electronic transmission. I acknowledge that I have been given the opportunity to request an in-person assessment or other available alternative prior to the telemedicine  visit and am voluntarily participating in the telemedicine visit.  I understand that I have the right to withhold or withdraw my consent to the use of telemedicine in the course of my care at any time, without affecting my right to future care or treatment, and that the Practitioner or I may terminate the telemedicine visit at any time. I understand that I have the right to inspect all information obtained and/or recorded in the course of the telemedicine visit and may receive copies of available information for a reasonable fee.  I understand that some of the potential risks of receiving the Services via telemedicine include:  Marland Kitchen Delay or interruption in medical evaluation due to technological equipment failure or disruption; . Information transmitted may not be sufficient (e.g. poor resolution of images) to allow for appropriate medical decision making by the Practitioner; and/or  . In rare instances, security protocols could fail, causing a breach of personal health information.  Furthermore, I acknowledge that it is my responsibility to provide information about my medical history, conditions and care that is complete and accurate to the best of my ability. I acknowledge that Practitioner's advice, recommendations, and/or decision may be based on factors not within their control, such as incomplete or inaccurate data provided by me or distortions of diagnostic images or specimens that may result from electronic transmissions. I understand that the practice of medicine is not an exact science and that Practitioner makes no warranties or guarantees regarding treatment outcomes. I acknowledge that I will receive a copy of this consent concurrently upon execution via email to the email address I last provided but may also request a printed copy by calling the office of [facility].    I understand that my insurance will be billed for this visit.   I have read or had this consent read to me. . I  understand the contents of this consent, which adequately explains the benefits and risks of the Services being provided via telemedicine.  . I have been provided ample opportunity to ask questions regarding this consent and the Services and have had my questions answered to my satisfaction. . I give my informed consent for the services to be provided through the use of telemedicine in my medical care  By participating in this telemedicine visit I agree to the above.

## 2019-03-05 NOTE — Progress Notes (Signed)
Virtual Visit via Telephone Note   This visit type was conducted due to national recommendations for restrictions regarding the COVID-19 Pandemic (e.g. social distancing) in an effort to limit this patient's exposure and mitigate transmission in our community.  Due to his co-morbid illnesses, this patient is at least at moderate risk for complications without adequate follow up.  This format is felt to be most appropriate for this patient at this time.  The patient did not have access to video technology/had technical difficulties with video requiring transitioning to audio format only (telephone).  All issues noted in this document were discussed and addressed.  No physical exam could be performed with this format.  Please refer to the patient's chart for his  consent to telehealth for [facility].   Evaluation Performed:  Follow-up visit  Date:  03/05/2019   ID:  [name], DOB Jun 15, 1946, MRN [id number]  Patient Location: Home  Provider Location: Home  PCP:  Hoyt Koch, MD  Cardiologist:  Mertie Moores, MD   Electrophysiologist:  Cristopher Peru, MD   Chief Complaint: Follow-up on coronary artery disease, CHF  History of Present Illness:    [name] is a 73 y.o. male who presents via audio/video conferencing for a telehealth visit today.    He has a history of coronary artery disease status post prior CABG, ischemic cardiomyopathy, systolic heart failure with EF 20-25, ventricular tachycardia status post ICD, hypertension, hyperlipidemia.  Cardiac catheterization in 2017 demonstrated completely occluded native coronary arteries.  All bypass grafts were patent.  He was last seen by Dr. Acie Fredrickson in April 2019.  Today, he is doing well.  He denies any chest discomfort, significant shortness of breath, syncope, paroxysmal nocturnal dyspnea, lower extremity swelling.  The patient does not have symptoms concerning for COVID-19 infection (fever, chills, cough, or  new shortness of breath).    Past Medical History:  Diagnosis Date  . Adult ADHD   . AICD (automatic cardioverter/defibrillator) present   . CAD (coronary artery disease)    Cath 2008, all grafts patent // Cath 2017 - all grafts patent   . 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 Carotid artery disease ([place])    Doppler, February, 2012, mild plaque, 0-39% bilateral, plan followup in one year  . Dyspepsia   . Ejection fraction < 50%   . Elevated PSA 02/15/2011  . Esophageal stricture   . GERD (gastroesophageal reflux disease)   . HTN (hypertension)   . Hx of CABG    1991  . Hyperlipidemia   . 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 Myocardial infarction ([place]) 1998  . Prostate cancer ([place]) 2012   IMRT, ADT  . Thrombosis of arm    Left  . Venous thrombosis    Left arm   Past Surgical History:  Procedure Laterality Date  . CARDIAC CATHETERIZATION  12/05/06  . CARDIAC CATHETERIZATION N/A 12/21/2015   Procedure: Left Heart Cath and Coronary Angiography;  Surgeon: Lorretta Harp, MD;  Location: [place] [facility];  Service: Cardiovascular;  Laterality: N/A;  . CORONARY ARTERY BYPASS GRAFT     LIMA_LAD, SVG-OM, Cx, RCA '91  . CYSTECTOMY     Hand  . HAND CONTRACTURE RELEASE     bilateral release of Dupyrten's contracture: '  10 and '11  . ICD Placement    . IMPLANTABLE CARDIOVERTER DEFIBRILLATOR (ICD) GENERATOR CHANGE  04/06/2015   Procedure: Icd Generator Change;  Surgeon: Evans Lance, MD;  Location: [place] [facility];  Service: Cardiovascular;;  . menistectomies, left knee  '09  . ROTATOR CUFF REPAIR  2005, 2007  . Thumb Surgery    . TONSILLECTOMY  2005   Malignant lesion, w/XRT     Current Meds  Medication Sig  . aspirin 81 MG EC tablet Take 81 mg by mouth daily.    Marland Kitchen  atorvastatin (LIPITOR) 80 MG tablet TAKE 1 TABLET(80 MG) BY MOUTH DAILY AT 6 PM  . buPROPion (WELLBUTRIN XL) 300 MG 24 hr tablet Take 300 mg by mouth daily.  . busPIRone (BUSPAR) 30 MG tablet Take 30 mg by mouth daily.   . carvedilol (COREG) 12.5 MG tablet TAKE 1 TABLET(12.5 MG) BY MOUTH TWICE DAILY  . EPINEPHrine 0.3 mg/0.3 mL IJ SOAJ injection Inject 0.3 mLs (0.3 mg total) into the muscle as needed (anaphylaxis).  Marland Kitchen escitalopram (LEXAPRO) 20 MG tablet Take 20 mg by mouth daily.    Marland Kitchen esomeprazole (NEXIUM) 20 MG packet Take 20 mg by mouth daily before breakfast.  . losartan (COZAAR) 50 MG tablet TAKE 1 TABLET(50 MG) BY MOUTH DAILY  . Multiple Vitamins-Minerals (OCUVITE PRESERVISION) TABS Take 1 tablet by mouth daily.   . nitroGLYCERIN (NITROSTAT) 0.4 MG SL tablet Place 1 tablet (0.4 mg total) under the tongue every 5 (five) minutes as needed for chest pain.  Marland Kitchen zolpidem (AMBIEN) 10 MG tablet Take 1 tablet by mouth at bedtime as needed for sleep.      Allergies:   Other   Social History   Tobacco Use  . Smoking status: Never Smoker  . Smokeless tobacco: Never Used  Substance Use Topics  . Alcohol use: No  . Drug use: No     Family Hx: The patient's family history includes Alzheimer's disease in his mother; Breast cancer in his sister; CAD in his father; Congestive Heart Failure in his father; Dementia in his mother; Heart disease in his brother; Hyperlipidemia in his father; Hypertension in his father. There is no history of Colon cancer, Prostate cancer, or Diabetes.  ROS:   Please see the history of present illness.      All other systems reviewed and are negative.   Prior CV studies:   The following studies were reviewed today:  Cardiac catheterization 12/21/2015 LAD ostial 100 LCx proximal 100 RCA ostial 100 LIMA-LAD patent Sequential SVG-OM2/OM3 patent SVG-ostial RPDA patent  Echocardiogram 08/18/2015 EF 20-25, diffuse HK, apical akinesis, grade 1 diastolic dysfunction,  mild AI, severe LAE, mild to moderate RAE  Carotid US 03/19/2013 Bilateral ICA 0-39  Labs/Other Tests and Data Reviewed:    EKG:  No ECG reviewed.  Recent Labs: No results found for requested labs within last 8760 hours.   Recent Lipid Panel Lab Results  Component Value Date/Time   CHOL 143 02/27/2018 11:51 AM   TRIG 100 02/27/2018 11:51 AM   HDL 61 02/27/2018 11:51 AM   CHOLHDL 2.3 02/27/2018 11:51 AM   CHOLHDL 2.5 11/01/2016 11:00 AM   LDLCALC 62 02/27/2018 11:51 AM    Wt Readings from Last 3 Encounters:  03/05/19 180 lb (81.6 kg)  09/07/18 178 lb 12.8 oz (81.1 kg)  06/17/18 185 lb (83.9 kg)     Objective:    Vital Signs:  BP 138/80   Pulse 67   Ht 6' (1.829  m)   Wt 180 lb (81.6 kg)   BMI 24.41 kg/m    GEN:  During our conversation, he does not seem to be in acute distress RESP:  There is no sign of labored breathing during our conversation. NEURO:  He seems to be alert and oriented. PSYCH:  He seems to be in good spirits.  ASSESSMENT & PLAN:    Coronary artery disease involving native coronary artery of native heart without angina pectoris History of CABG.  Cardiac catheterization in 2017 with patent bypass grafts.  He is not having any symptoms of angina.  Continue aspirin, statin, beta-blocker.  Chronic systolic CHF (congestive heart failure) (HCC) EF 20-25 by most recent echocardiogram.  NYHA 2.  He does not require diuretic therapy.  Continue beta-blocker, ARB.  Essential hypertension The patient's blood pressure is controlled on his current regimen.  Continue current therapy.   Pure hypercholesterolemia LDL optimal on most recent lab work.  Continue current Rx.    Ventricular tachycardia ([place]) In review of his chart, it does not appear that he has had recurrent ventricular tachycardia.  Continue follow-up with Dr. Lovena Le as planned.  ICD (implantable cardioverter-defibrillator) in place Continue follow-up with EP as planned.  Educated About  Covid-19 Virus Infection The signs and symptoms of COVID-19 were discussed with the patient and how to seek care for testing (follow up with PCP or arrange E-visit).   The importance of social distancing was discussed today.  Time:   Today, I have spent 8 minutes with the patient with telehealth technology discussing the above problems.     Medication Adjustments/Labs and Tests Ordered: Current medicines are reviewed at length with the patient today.  Concerns regarding medicines are outlined above.  Tests Ordered: No orders of the defined types were placed in this encounter.  Medication Changes: No orders of the defined types were placed in this encounter.   Disposition:  Follow up in 1 year(s)  Signed, Richardson Dopp, PA-C  03/05/2019 11:27 AM    [facility]

## 2019-03-05 NOTE — Patient Instructions (Addendum)
Medication Instructions:  No changes  If you need a refill on your cardiac medications before your next appointment, please call your pharmacy.   Lab work: None  If you have labs (blood work) drawn today and your tests are completely normal, you will receive your results only by: Marland Kitchen MyChart Message (if you have MyChart) OR . A paper copy in the mail If you have any lab test that is abnormal or we need to change your treatment, we will call you to review the results.  Testing/Procedures: None  Follow-Up: At [facility], you and your health needs are our priority.  As part of our continuing mission to provide you with exceptional heart care, we have created designated Provider Care Teams.  These Care Teams include your primary Cardiologist (physician) and Advanced Practice Providers (APPs -  Physician Assistants and Nurse Practitioners) who all work together to provide you with the care you need, when you need it. You will need a follow up appointment in:  12 months.  Please call our office 2 months in advance to schedule this appointment.  You may see Mertie Moores, MD or Richardson Dopp, PA-C   Any Other Special Instructions Will Be Listed Below (If Applicable).

## 2019-03-11 ENCOUNTER — Ambulatory Visit: Payer: Medicare Other | Admitting: Physician Assistant

## 2019-03-15 ENCOUNTER — Other Ambulatory Visit: Payer: Self-pay | Admitting: Cardiovascular Disease

## 2019-03-15 DIAGNOSIS — I5022 Chronic systolic (congestive) heart failure: Secondary | ICD-10-CM

## 2019-03-15 DIAGNOSIS — I2510 Atherosclerotic heart disease of native coronary artery without angina pectoris: Secondary | ICD-10-CM

## 2019-03-15 DIAGNOSIS — I2583 Coronary atherosclerosis due to lipid rich plaque: Principal | ICD-10-CM

## 2019-03-15 DIAGNOSIS — I951 Orthostatic hypotension: Secondary | ICD-10-CM

## 2019-04-09 ENCOUNTER — Ambulatory Visit (INDEPENDENT_AMBULATORY_CARE_PROVIDER_SITE_OTHER): Payer: Medicare Other | Admitting: *Deleted

## 2019-04-09 ENCOUNTER — Other Ambulatory Visit: Payer: Self-pay

## 2019-04-09 DIAGNOSIS — I255 Ischemic cardiomyopathy: Secondary | ICD-10-CM

## 2019-04-09 DIAGNOSIS — I5022 Chronic systolic (congestive) heart failure: Secondary | ICD-10-CM | POA: Diagnosis not present

## 2019-04-10 ENCOUNTER — Other Ambulatory Visit: Payer: Self-pay

## 2019-04-10 ENCOUNTER — Encounter (INDEPENDENT_AMBULATORY_CARE_PROVIDER_SITE_OTHER): Payer: Medicare Other | Admitting: Ophthalmology

## 2019-04-10 DIAGNOSIS — H33302 Unspecified retinal break, left eye: Secondary | ICD-10-CM

## 2019-04-10 DIAGNOSIS — I10 Essential (primary) hypertension: Secondary | ICD-10-CM

## 2019-04-10 DIAGNOSIS — D3132 Benign neoplasm of left choroid: Secondary | ICD-10-CM

## 2019-04-10 DIAGNOSIS — H353122 Nonexudative age-related macular degeneration, left eye, intermediate dry stage: Secondary | ICD-10-CM | POA: Diagnosis not present

## 2019-04-10 DIAGNOSIS — H35033 Hypertensive retinopathy, bilateral: Secondary | ICD-10-CM | POA: Diagnosis not present

## 2019-04-10 DIAGNOSIS — H35111 Retinopathy of prematurity, stage 0, right eye: Secondary | ICD-10-CM

## 2019-04-10 DIAGNOSIS — H2513 Age-related nuclear cataract, bilateral: Secondary | ICD-10-CM

## 2019-04-10 LAB — CUP PACEART REMOTE DEVICE CHECK
Battery Remaining Longevity: 99 mo
Battery Voltage: 3.01 V
Brady Statistic RV Percent Paced: 0.16 %
Date Time Interrogation Session: 20200512062702
HighPow Impedance: 42 Ohm
HighPow Impedance: 50 Ohm
Implantable Lead Implant Date: 20080108
Implantable Lead Location: 753860
Implantable Lead Model: 6947
Implantable Pulse Generator Implant Date: 20160509
Lead Channel Impedance Value: 304 Ohm
Lead Channel Impedance Value: 342 Ohm
Lead Channel Pacing Threshold Amplitude: 1 V
Lead Channel Pacing Threshold Pulse Width: 0.4 ms
Lead Channel Sensing Intrinsic Amplitude: 22.125 mV
Lead Channel Sensing Intrinsic Amplitude: 22.125 mV
Lead Channel Setting Pacing Amplitude: 2 V
Lead Channel Setting Pacing Pulse Width: 0.4 ms
Lead Channel Setting Sensing Sensitivity: 0.3 mV

## 2019-04-25 NOTE — Progress Notes (Signed)
Remote ICD transmission.   

## 2019-05-02 ENCOUNTER — Other Ambulatory Visit: Payer: Self-pay | Admitting: Internal Medicine

## 2019-05-07 DIAGNOSIS — F900 Attention-deficit hyperactivity disorder, predominantly inattentive type: Secondary | ICD-10-CM | POA: Diagnosis not present

## 2019-05-07 DIAGNOSIS — F411 Generalized anxiety disorder: Secondary | ICD-10-CM | POA: Diagnosis not present

## 2019-05-07 DIAGNOSIS — F331 Major depressive disorder, recurrent, moderate: Secondary | ICD-10-CM | POA: Diagnosis not present

## 2019-05-07 DIAGNOSIS — F422 Mixed obsessional thoughts and acts: Secondary | ICD-10-CM | POA: Diagnosis not present

## 2019-07-09 ENCOUNTER — Ambulatory Visit (INDEPENDENT_AMBULATORY_CARE_PROVIDER_SITE_OTHER): Payer: Medicare Other | Admitting: *Deleted

## 2019-07-09 DIAGNOSIS — I255 Ischemic cardiomyopathy: Secondary | ICD-10-CM

## 2019-07-09 LAB — CUP PACEART REMOTE DEVICE CHECK
Battery Remaining Longevity: 99 mo
Battery Voltage: 3.01 V
Brady Statistic RV Percent Paced: 0.21 %
Date Time Interrogation Session: 20200811073324
HighPow Impedance: 42 Ohm
HighPow Impedance: 50 Ohm
Implantable Lead Implant Date: 20080108
Implantable Lead Location: 753860
Implantable Lead Model: 6947
Implantable Pulse Generator Implant Date: 20160509
Lead Channel Impedance Value: 285 Ohm
Lead Channel Impedance Value: 342 Ohm
Lead Channel Pacing Threshold Amplitude: 1.125 V
Lead Channel Pacing Threshold Pulse Width: 0.4 ms
Lead Channel Sensing Intrinsic Amplitude: 19.5 mV
Lead Channel Sensing Intrinsic Amplitude: 19.5 mV
Lead Channel Setting Pacing Amplitude: 2.5 V
Lead Channel Setting Pacing Pulse Width: 0.4 ms
Lead Channel Setting Sensing Sensitivity: 0.3 mV

## 2019-07-18 ENCOUNTER — Encounter: Payer: Self-pay | Admitting: Cardiology

## 2019-07-18 NOTE — Progress Notes (Signed)
Remote ICD transmission.   

## 2019-09-03 ENCOUNTER — Other Ambulatory Visit: Payer: Self-pay | Admitting: Internal Medicine

## 2019-09-03 ENCOUNTER — Other Ambulatory Visit: Payer: Self-pay | Admitting: Cardiovascular Disease

## 2019-10-08 ENCOUNTER — Ambulatory Visit (INDEPENDENT_AMBULATORY_CARE_PROVIDER_SITE_OTHER): Payer: Medicare Other | Admitting: *Deleted

## 2019-10-08 DIAGNOSIS — I255 Ischemic cardiomyopathy: Secondary | ICD-10-CM

## 2019-10-08 DIAGNOSIS — I472 Ventricular tachycardia, unspecified: Secondary | ICD-10-CM

## 2019-10-08 LAB — CUP PACEART REMOTE DEVICE CHECK
Battery Remaining Longevity: 96 mo
Battery Voltage: 3 V
Brady Statistic RV Percent Paced: 0.37 %
Date Time Interrogation Session: 20201110093824
HighPow Impedance: 42 Ohm
HighPow Impedance: 49 Ohm
Implantable Lead Implant Date: 20080108
Implantable Lead Location: 753860
Implantable Lead Model: 6947
Implantable Pulse Generator Implant Date: 20160509
Lead Channel Impedance Value: 285 Ohm
Lead Channel Impedance Value: 342 Ohm
Lead Channel Pacing Threshold Amplitude: 1 V
Lead Channel Pacing Threshold Pulse Width: 0.4 ms
Lead Channel Sensing Intrinsic Amplitude: 19.75 mV
Lead Channel Sensing Intrinsic Amplitude: 19.75 mV
Lead Channel Setting Pacing Amplitude: 2 V
Lead Channel Setting Pacing Pulse Width: 0.4 ms
Lead Channel Setting Sensing Sensitivity: 0.3 mV

## 2019-10-29 NOTE — Progress Notes (Signed)
Remote pacemaker transmission.   

## 2019-11-06 DIAGNOSIS — F411 Generalized anxiety disorder: Secondary | ICD-10-CM | POA: Diagnosis not present

## 2019-11-06 DIAGNOSIS — F900 Attention-deficit hyperactivity disorder, predominantly inattentive type: Secondary | ICD-10-CM | POA: Diagnosis not present

## 2019-11-06 DIAGNOSIS — F422 Mixed obsessional thoughts and acts: Secondary | ICD-10-CM | POA: Diagnosis not present

## 2019-11-06 DIAGNOSIS — F4312 Post-traumatic stress disorder, chronic: Secondary | ICD-10-CM | POA: Diagnosis not present

## 2019-12-02 ENCOUNTER — Other Ambulatory Visit: Payer: Self-pay | Admitting: Internal Medicine

## 2019-12-12 ENCOUNTER — Other Ambulatory Visit: Payer: Self-pay

## 2019-12-12 ENCOUNTER — Ambulatory Visit: Payer: Medicare Other | Admitting: Internal Medicine

## 2019-12-12 ENCOUNTER — Encounter: Payer: Self-pay | Admitting: Internal Medicine

## 2019-12-12 VITALS — BP 102/68 | HR 64 | Ht 72.0 in | Wt 191.4 lb

## 2019-12-12 DIAGNOSIS — I472 Ventricular tachycardia, unspecified: Secondary | ICD-10-CM

## 2019-12-12 DIAGNOSIS — I255 Ischemic cardiomyopathy: Secondary | ICD-10-CM | POA: Diagnosis not present

## 2019-12-12 DIAGNOSIS — Z95810 Presence of automatic (implantable) cardiac defibrillator: Secondary | ICD-10-CM

## 2019-12-12 NOTE — Patient Instructions (Signed)
Medication Instructions:  Your physician recommends that you continue on your current medications as directed. Please refer to the Current Medication list given to you today.  Labwork: None ordered.  Testing/Procedures: None ordered.  Follow-Up: Your physician wants you to follow-up in: one year with Dr. Lovena Le.   You will receive a reminder letter in the mail two months in advance. If you don't receive a letter, please call our office to schedule the follow-up appointment.  Remote monitoring is used to monitor your ICD from home. This monitoring reduces the number of office visits required to check your device to one time per year. It allows [place] to keep an eye on the functioning of your device to ensure it is working properly. You are scheduled for a device check from home on 01/07/2020. You may send your transmission at any time that day. If you have a wireless device, the transmission will be sent automatically. After your physician reviews your transmission, you will receive a postcard with your next transmission date.  Any Other Special Instructions Will Be Listed Below (If Applicable).  If you need a refill on your cardiac medications before your next appointment, please call your pharmacy.

## 2019-12-12 NOTE — Progress Notes (Signed)
HPI [name] today for follow-up of chronic systolic heart failure, ventricular tachycardia, and an ischemic cardiomyopathy. He is a very pleasant 74 year old man with known ventricular tachycardia status post ICD insertion many years ago. He underwent generator change out approximately 5 years ago. He has had no intercurrent ICD shocks. He denies chest pain or shortness of breath. He feels occaisional palpitations.  Allergies  Allergen Reactions  . Other Anaphylaxis    Insect bites (Has EpiPen)     Current Outpatient Medications  Medication Sig Dispense Refill  . aspirin 81 MG EC tablet Take 81 mg by mouth daily.      Marland Kitchen atorvastatin (LIPITOR) 80 MG tablet TAKE 1 TABLET(80 MG) BY MOUTH DAILY AT 6 PM 90 tablet 1  . buPROPion (WELLBUTRIN XL) 300 MG 24 hr tablet Take 300 mg by mouth daily.    . busPIRone (BUSPAR) 30 MG tablet Take 30 mg by mouth daily.     . carvedilol (COREG) 12.5 MG tablet TAKE 1 TABLET(12.5 MG) BY MOUTH TWICE DAILY 60 tablet 11  . EPINEPHrine 0.3 mg/0.3 mL IJ SOAJ injection Inject 0.3 mLs (0.3 mg total) into the muscle as needed (anaphylaxis). 2 Device 0  . escitalopram (LEXAPRO) 20 MG tablet Take 20 mg by mouth daily.      Marland Kitchen esomeprazole (NEXIUM) 20 MG packet Take 20 mg by mouth daily before breakfast.    . losartan (COZAAR) 50 MG tablet Take 1 tablet (50 mg total) by mouth daily. Please keep upcoming appt with Dr. Lovena Le in January before anymore refills. Thank you 90 tablet 0  . Multiple Vitamins-Minerals (OCUVITE PRESERVISION) TABS Take 1 tablet by mouth daily.     . nitroGLYCERIN (NITROSTAT) 0.4 MG SL tablet Place 1 tablet (0.4 mg total) under the tongue every 5 (five) minutes as needed for chest pain. 25 tablet 12  . zolpidem (AMBIEN) 10 MG tablet Take 1 tablet by mouth at bedtime as needed for sleep.      No current facility-administered medications for this visit.     Past Medical History:  Diagnosis Date  . Adult ADHD   . AICD (automatic  cardioverter/defibrillator) present   . CAD (coronary artery disease)    Cath 2008, all grafts patent // Cath 2017 - all grafts patent   . 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 Carotid artery disease ([place])    Doppler, February, 2012, mild plaque, 0-39% bilateral, plan followup in one year  . Dyspepsia   . Ejection fraction < 50%   . Elevated PSA 02/15/2011  . Esophageal stricture   . GERD (gastroesophageal reflux disease)   . HTN (hypertension)   . Hx of CABG    1991  . Hyperlipidemia   . 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 Myocardial infarction ([place]) 1998  . Prostate cancer ([place]) 2012   IMRT, ADT  . Thrombosis of arm    Left  . Venous thrombosis    Left arm    ROS:   All systems reviewed and negative except as noted in the HPI.   Past Surgical History:  Procedure Laterality Date  . CARDIAC CATHETERIZATION  12/05/06  . CARDIAC CATHETERIZATION N/A 12/21/2015   Procedure: Left Heart Cath and Coronary Angiography;  Surgeon: Lorretta Harp, MD;  Location: [facility];  Service: Cardiovascular;  Laterality: N/A;  . CORONARY ARTERY BYPASS GRAFT     LIMA_LAD, SVG-OM, Cx, RCA '91  . CYSTECTOMY     Hand  . 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 ICD Placement    . IMPLANTABLE CARDIOVERTER DEFIBRILLATOR (ICD) GENERATOR CHANGE  04/06/2015   Procedure: Icd Generator Change;  Surgeon: Evans Lance, MD;  Location: [facility];  Service: Cardiovascular;;  . menistectomies, left knee  '09  . ROTATOR CUFF REPAIR  2005, 2007  . Thumb Surgery    . TONSILLECTOMY  2005   Malignant lesion, w/XRT     Family History  Problem Relation Age of Onset  . Alzheimer's disease Mother   . Dementia Mother        living in [place]  .  Hyperlipidemia Father   . Hypertension Father   . CAD Father        had pacemaker  . Congestive Heart Failure Father   . Heart disease Brother        Bypass surgery  . Breast cancer Sister        survivor  . Colon cancer Neg Hx   . Prostate cancer Neg Hx   . Diabetes Neg Hx      Social History   Socioeconomic History  . Marital status: Married    Spouse name: Not on file  . Number of children: 0  . Years of education: 37  . Highest education level: Not on file  Occupational History  . Occupation: Retired    Fish farm manager: RETIRED    CommentOceanographer for [employer]  Tobacco Use  . Smoking status: Never Smoker  . Smokeless tobacco: Never Used  Substance and Sexual Activity  . Alcohol use: No  . Drug use: No  . Sexual activity: Yes    Birth control/protection: None  Other Topics Concern  . Not on file  Social History Narrative   [employer], [employer]. 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 Marriage in good health, wife in good health. ACP discussed Jan '15: no CPR, no mechanical ventilation. He is referred to [email or web address] and provided with packet with HCPOA-his wife./ Advanced directive.   Fun/Hobby: Yard work, Architect working.    Social Determinants of Health   Financial Resource Strain:   . Difficulty of Paying Living Expenses: Not on file  Food Insecurity:   . Worried About Charity fundraiser in the Last Year: Not on file  . Ran Out of Food in the Last Year: Not on file  Transportation Needs:   . Lack of Transportation (Medical): Not on file  . Lack of Transportation (Non-Medical): Not on file  Physical Activity:   . Days of Exercise per Week: Not on file  . Minutes of Exercise per Session: Not on file  Stress:   . Feeling of Stress : Not on file  Social Connections:   . Frequency of Communication with Friends and Family: Not on file  . Frequency of Social Gatherings with Friends and Family: Not on file  . Attends Religious Services: Not on file    . Active Member of Clubs or Organizations: Not on file  . Attends Archivist Meetings: Not on file  . Marital Status: Not on file  Intimate Partner Violence:   . Fear of Current or Ex-Partner: Not on file  . Emotionally Abused: Not on file  .  Physically Abused: Not on file  . Sexually Abused: Not on file     BP 102/68   Pulse 64   Ht 6' (1.829 m)   Wt 191 lb 6.4 oz (86.8 kg)   SpO2 98%   BMI 25.96 kg/m   Physical Exam:  Well appearing NAD HEENT: Unremarkable Neck:  No JVD, no thyromegally Lymphatics:  No adenopathy Back:  No CVA tenderness Lungs:  Clear with no wheezes HEART:  Regular rate rhythm, no murmurs, no rubs, no clicks Abd:  soft, positive bowel sounds, no organomegally, no rebound, no guarding Ext:  2 plus pulses, no edema, no cyanosis, no clubbing Skin:  No rashes no nodules Neuro:  CN II through XII intact, motor grossly intact  EKG - NSR with PVC's   DEVICE  Normal device function.  See PaceArt for details.   Assess/Plan: 1. VT - he has had a couple of episodes, all terminated with ATP. He will continue his current meds. 2. Chronic systolic heart failure - his symptoms are class 2. He will continue his current meds. 3. ICD - his medtronic single chamber ICD is working normally. We will recheck in several months. 4. Dyslipidemia - he is tolerating his statin. Continue.  [email or web address].

## 2019-12-17 LAB — CUP PACEART INCLINIC DEVICE CHECK
Battery Remaining Longevity: 94 mo
Battery Voltage: 2.98 V
Brady Statistic RV Percent Paced: 0.22 %
Date Time Interrogation Session: 20210114163300
HighPow Impedance: 44 Ohm
HighPow Impedance: 54 Ohm
Implantable Lead Implant Date: 20080108
Implantable Lead Location: 753860
Implantable Lead Model: 6947
Implantable Pulse Generator Implant Date: 20160509
Lead Channel Impedance Value: 304 Ohm
Lead Channel Impedance Value: 361 Ohm
Lead Channel Pacing Threshold Amplitude: 1.25 V
Lead Channel Pacing Threshold Pulse Width: 0.4 ms
Lead Channel Sensing Intrinsic Amplitude: 20 mV
Lead Channel Setting Pacing Amplitude: 2.25 V
Lead Channel Setting Pacing Pulse Width: 0.4 ms
Lead Channel Setting Sensing Sensitivity: 0.3 mV

## 2020-01-07 ENCOUNTER — Ambulatory Visit (INDEPENDENT_AMBULATORY_CARE_PROVIDER_SITE_OTHER): Payer: Medicare Other | Admitting: *Deleted

## 2020-01-07 DIAGNOSIS — I472 Ventricular tachycardia, unspecified: Secondary | ICD-10-CM

## 2020-01-08 ENCOUNTER — Telehealth: Payer: Self-pay

## 2020-01-08 NOTE — Telephone Encounter (Signed)
Left message for patient to remind of missed remote transmission.  

## 2020-01-10 LAB — CUP PACEART REMOTE DEVICE CHECK
Battery Remaining Longevity: 92 mo
Battery Voltage: 3 V
Brady Statistic RV Percent Paced: 0.82 %
Date Time Interrogation Session: 20210209022822
HighPow Impedance: 43 Ohm
HighPow Impedance: 53 Ohm
Implantable Lead Implant Date: 20080108
Implantable Lead Location: 753860
Implantable Lead Model: 6947
Implantable Pulse Generator Implant Date: 20160509
Lead Channel Impedance Value: 304 Ohm
Lead Channel Impedance Value: 399 Ohm
Lead Channel Pacing Threshold Amplitude: 1 V
Lead Channel Pacing Threshold Pulse Width: 0.4 ms
Lead Channel Sensing Intrinsic Amplitude: 18.75 mV
Lead Channel Sensing Intrinsic Amplitude: 18.75 mV
Lead Channel Setting Pacing Amplitude: 2.5 V
Lead Channel Setting Pacing Pulse Width: 0.4 ms
Lead Channel Setting Sensing Sensitivity: 0.3 mV

## 2020-01-10 NOTE — Progress Notes (Signed)
ICD Remote  

## 2020-03-02 ENCOUNTER — Other Ambulatory Visit: Payer: Self-pay

## 2020-03-02 MED ORDER — ATORVASTATIN CALCIUM 80 MG PO TABS: ORAL_TABLET | ORAL | 2 refills | Status: DC

## 2020-03-09 ENCOUNTER — Encounter: Payer: Self-pay | Admitting: Cardiovascular Disease

## 2020-03-09 ENCOUNTER — Other Ambulatory Visit: Payer: Self-pay

## 2020-03-09 ENCOUNTER — Ambulatory Visit: Payer: Medicare Other | Admitting: Cardiovascular Disease

## 2020-03-09 VITALS — BP 118/72 | HR 78 | Ht 72.0 in | Wt 182.2 lb

## 2020-03-09 DIAGNOSIS — I5022 Chronic systolic (congestive) heart failure: Secondary | ICD-10-CM

## 2020-03-09 DIAGNOSIS — I2510 Atherosclerotic heart disease of native coronary artery without angina pectoris: Secondary | ICD-10-CM | POA: Diagnosis not present

## 2020-03-09 DIAGNOSIS — I472 Ventricular tachycardia, unspecified: Secondary | ICD-10-CM

## 2020-03-09 DIAGNOSIS — E7800 Pure hypercholesterolemia, unspecified: Secondary | ICD-10-CM

## 2020-03-09 DIAGNOSIS — I10 Essential (primary) hypertension: Secondary | ICD-10-CM

## 2020-03-09 LAB — HEPATIC FUNCTION PANEL
ALT: 9 IU/L (ref 0–44)
AST: 13 IU/L (ref 0–40)
Albumin: 4.1 g/dL (ref 3.7–4.7)
Alkaline Phosphatase: 105 IU/L (ref 39–117)
Bilirubin Total: 0.6 mg/dL (ref 0.0–1.2)
Bilirubin, Direct: 0.18 mg/dL (ref 0.00–0.40)
Total Protein: 6.4 g/dL (ref 6.0–8.5)

## 2020-03-09 LAB — BASIC METABOLIC PANEL
BUN/Creatinine Ratio: 8 — ABNORMAL LOW (ref 10–24)
BUN: 12 mg/dL (ref 8–27)
CO2: 22 mmol/L (ref 20–29)
Calcium: 8.7 mg/dL (ref 8.6–10.2)
Chloride: 106 mmol/L (ref 96–106)
Creatinine, Ser: 1.54 mg/dL — ABNORMAL HIGH (ref 0.76–1.27)
GFR calc Af Amer: 51 mL/min/{1.73_m2} — ABNORMAL LOW (ref >59)
GFR calc non Af Amer: 44 mL/min/{1.73_m2} — ABNORMAL LOW (ref >59)
Glucose: 128 mg/dL — ABNORMAL HIGH (ref 65–99)
Potassium: 3.9 mmol/L (ref 3.5–5.2)
Sodium: 142 mmol/L (ref 134–144)

## 2020-03-09 LAB — LIPID PANEL
Chol/HDL Ratio: 2.5 ratio (ref 0.0–5.0)
Cholesterol, Total: 152 mg/dL (ref 100–199)
HDL: 61 mg/dL (ref >39)
LDL Chol Calc (NIH): 75 mg/dL (ref 0–99)
Triglycerides: 88 mg/dL (ref 0–149)
VLDL Cholesterol Cal: 16 mg/dL (ref 5–40)

## 2020-03-09 NOTE — Patient Instructions (Signed)
Medication Instructions:  Your physician recommends that you continue on your current medications as directed. Please refer to the Current Medication list given to you today.  *If you need a refill on your cardiac medications before your next appointment, please call your pharmacy*   Lab Work: TODAY - cholesterol, liver panel, basic metabolic panel If you have labs (blood work) drawn today and your tests are completely normal, you will receive your results only by: Marland Kitchen MyChart Message (if you have MyChart) OR . A paper copy in the mail If you have any lab test that is abnormal or we need to change your treatment, we will call you to review the results.   Testing/Procedures: None Ordered    Follow-Up: At [facility], you and your health needs are our priority.  As part of our continuing mission to provide you with exceptional heart care, we have created designated Provider Care Teams.  These Care Teams include your primary Cardiologist (physician) and Advanced Practice Providers (APPs -  Physician Assistants and Nurse Practitioners) who all work together to provide you with the care you need, when you need it.  We recommend signing up for the patient portal called "MyChart".  Sign up information is provided on this After Visit Summary.  MyChart is used to connect with patients for Virtual Visits (Telemedicine).  Patients are able to view lab/test results, encounter notes, upcoming appointments, etc.  Non-urgent messages can be sent to your provider as well.   To learn more about what you can do with MyChart, go to [email or web address].    Your next appointment:   1 year(s)  The format for your next appointment:   In Person  Provider:   Richardson Dopp, PA-C or Robbie Lis, PA-C

## 2020-03-09 NOTE — Progress Notes (Signed)
Cardiology Office Note   Date:  03/09/2020   ID:  [name], DOB July 02, 1946, MRN [id number]  PCP:  Hoyt Koch, MD  Cardiologist:  Mertie Moores, MD , previous [name] patient   No chief complaint on file.  Problem List 1. CAD 2. Chronic systolic CHF 3. Essential HTN 4. ICD  5. Hyperlipidemia    History of Present Illness: [name] is a 74 y.o. male who presents today to follow-up coronary disease and ischemic cardiomyopathy and hypertension. He recently had an ICD generator change. He has seen Dr. Lovena Le in follow-up. He has significant left ventricular dysfunction. He has not had an echo for many many years. He has been clinically stable. He is on the medications for CHF that he has been able to tolerate in the past. Because of complicated medicines for his adult ADHD, I have chosen not to push his medicines any further over time. Of course this can always be reconsidered going forward.  He's done very well over the years and he continues to do well. He is not having any chest pain or shortness of breath. When his ICD has been interrogated, he has not had any recurring ventricular tachycardia.  June 01, 2016:  [name] is seen today for follow up  Brought some BP readings.  Has been low at times.   His symptoms of orthostatic hypotension. These low blood pressure readings typically occur at midday. He takes his pressure pills at 10 AM. His primary medical doctor has reduced his Diovan dose from 160 mg to 80 mg a day. His symptoms have improved since that time.  February 10, 2017:  [name] is seen back today for follow-up of his coronary artery disease and HTN.  BP is elevated today  - just took meds an hour ago BP at home is good . No cP.   Able to do his normal activities.   February 27, 2018:   [name] is seen today for follow-up visit.  He has a history of coronary artery disease, coronary artery bypass grafting, chronic systolic congestive  heart failure, ICD , and hyperlipidemia.  He is able to go out and do his normal daily activities without any significant chest pain or shortness of breath.  Does not exercise per se but does do yard work and is active and otherwise.  March 09, 2020:  [name] is seen today for follow-up visit.  He was last seen 2 years ago.  He has a history of coronary artery disease and coronary artery bypass grafting.  He has a history of chronic systolic congestive heart failure, ICD, hyperlipidemia.  No CP or dyspnea Saw Dr. Lovena Le several months back  -he has had several episodes of ventricular tachycardia which were all terminated with antitachycardia pacing.  Able to do his activities without any excessive dyspnea.    Past Medical History:  Diagnosis Date  . Adult ADHD   . AICD (automatic cardioverter/defibrillator) present   . CAD (coronary artery disease)    Cath 2008, all grafts patent // Cath 2017 - all grafts patent   . 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 Carotid artery disease ([place])    Doppler, February, 2012, mild plaque, 0-39% bilateral, plan followup in one year  . Dyspepsia   . Ejection fraction < 50%   . Elevated PSA 02/15/2011  . Esophageal stricture   . GERD (gastroesophageal reflux disease)   .  HTN (hypertension)   . Hx of CABG    1991  . Hyperlipidemia   . 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 Myocardial infarction ([name]) 1998  . Prostate cancer ([place]) 2012   IMRT, ADT  . Thrombosis of arm    Left  . Venous thrombosis    Left arm    Past Surgical History:  Procedure Laterality Date  . CARDIAC CATHETERIZATION  12/05/06  . CARDIAC CATHETERIZATION N/A 12/21/2015   Procedure: Left Heart Cath and Coronary Angiography;  Surgeon: Lorretta Harp, MD;  Location: [facility];  Service: Cardiovascular;  Laterality: N/A;  . CORONARY ARTERY BYPASS GRAFT     LIMA_LAD, SVG-OM, Cx, RCA '91  . CYSTECTOMY     Hand  . 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 ICD Placement    . IMPLANTABLE CARDIOVERTER DEFIBRILLATOR (ICD) GENERATOR CHANGE  04/06/2015   Procedure: Icd Generator Change;  Surgeon: Evans Lance, MD;  Location: [facility];  Service: Cardiovascular;;  . menistectomies, left knee  '09  . ROTATOR CUFF REPAIR  2005, 2007  . Thumb Surgery    . TONSILLECTOMY  2005   Malignant lesion, w/XRT    Patient Active Problem List   Diagnosis Date Noted  . Chronic systolic CHF (congestive heart failure) ([place]) 12/30/2015    Priority: High  . CAD (coronary artery disease)     Priority: High  . Medicare annual wellness visit, subsequent 05/19/2017  . Allergic rhinitis 05/05/2017  . Orthostatic hypotension 05/11/2016  . Elevated LFTs 03/24/2016  . Impaired fasting blood sugar 01/10/2016  . Epigastric pain 12/21/2015  . Atypical chest pain 12/21/2015  . ST elevation myocardial infarction (STEMI) ([place])   . Hx of CABG   . Adult ADHD   . Ejection fraction < 50%   . ICD (implantable cardioverter-defibrillator) in place 03/10/2015  . Ventricular tachycardia ([place]) 03/05/2014  . Routine health maintenance 12/11/2013  . Advanced care planning/counseling discussion 12/10/2013  . Cardiomyopathy, ischemic   . Essential hypertension   . Hyperlipidemia   . Encounter for servicing of automatic implantable cardioverter-defibrillator (AICD) at end of battery life   . Carotid artery disease ([place])   . Prostate cancer ([name]) 11/09/2011  . 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 ESOPHAGEAL STRICTURE 08/23/2008      Current Outpatient Medications  Medication Sig Dispense Refill  . aspirin 81 MG EC tablet Take 81 mg by mouth daily.      Marland Kitchen atorvastatin (LIPITOR) 80 MG tablet TAKE 1 TABLET(80 MG) BY MOUTH DAILY AT  6 PM 90 tablet 2  . buPROPion (WELLBUTRIN XL) 300 MG 24 hr tablet Take 300 mg by mouth daily.    . busPIRone (BUSPAR) 30 MG tablet Take 30 mg by mouth daily.     . carvedilol (COREG) 12.5 MG tablet TAKE 1 TABLET(12.5 MG) BY MOUTH TWICE DAILY 60 tablet 11  . EPINEPHrine 0.3 mg/0.3 mL IJ SOAJ injection Inject 0.3 mLs (0.3 mg total) into the muscle as needed (anaphylaxis). 2 Device 0  . escitalopram (LEXAPRO) 20 MG tablet Take 20 mg by mouth daily.      Marland Kitchen esomeprazole (NEXIUM) 20 MG packet Take 20 mg by mouth daily before breakfast.    . losartan (COZAAR) 50 MG tablet Take 1 tablet (50 mg  total) by mouth daily. Please keep upcoming appt with Dr. Lovena Le in January before anymore refills. Thank you 90 tablet 0  . Multiple Vitamins-Minerals (OCUVITE PRESERVISION) TABS Take 1 tablet by mouth daily.     . nitroGLYCERIN (NITROSTAT) 0.4 MG SL tablet Place 1 tablet (0.4 mg total) under the tongue every 5 (five) minutes as needed for chest pain. 25 tablet 12  . zolpidem (AMBIEN) 10 MG tablet Take 1 tablet by mouth at bedtime as needed for sleep.      No current facility-administered medications for this visit.    Allergies:   Other    Social History:  The patient  reports that he has never smoked. He has never used smokeless tobacco. He reports that he does not drink alcohol or use drugs.   Family History:  The patient's family history includes Alzheimer's disease in his mother; Breast cancer in his sister; CAD in his father; Congestive Heart Failure in his father; Dementia in his mother; Heart disease in his brother; Hyperlipidemia in his father; Hypertension in his father.    ROS: Noted in current history.  Otherwise review of systems is negative.  Physical Exam: Blood pressure 118/72, pulse 78, height 6' (1.829 m), weight 182 lb 3.2 oz (82.6 kg), SpO2 98 %.  GEN:  Well nourished, well developed in no acute distress HEENT: Normal NECK: No JVD; No carotid bruits LYMPHATICS: No  lymphadenopathy CARDIAC: RRR , no murmurs, rubs, gallops RESPIRATORY:  Clear to auscultation without rales, wheezing or rhonchi  ABDOMEN: Soft, non-tender, non-distended MUSCULOSKELETAL:  No edema; No deformity  SKIN: Warm and dry NEUROLOGIC:  Alert and oriented x 3   EKG:      Recent Labs: No results found for requested labs within last 8760 hours.    Lipid Panel    Component Value Date/Time   CHOL 143 02/27/2018 1151   TRIG 100 02/27/2018 1151   HDL 61 02/27/2018 1151   CHOLHDL 2.3 02/27/2018 1151   CHOLHDL 2.5 11/01/2016 1100   VLDL 27 11/01/2016 1100   LDLCALC 62 02/27/2018 1151      Wt Readings from Last 3 Encounters:  03/09/20 182 lb 3.2 oz (82.6 kg)  12/12/19 191 lb 6.4 oz (86.8 kg)  03/05/19 180 lb (81.6 kg)     Current medicines are reviewed  The patient understands his medications.   ASSESSMENT AND PLAN:  1. CAD -  His native coronary arteries are completely occluded. He had patent LIMA to the LAD and saphenous vein graft to the marginal system and to the RCA.    2. Chronic systolic CHF -     Stable .   Class 2 CHF  . 3. Essential HTN -   BP is well controlled.  .  Cont meds.   4. ICD  -  Followed by dr. Lovena Le   5. Hyperlipidemia -  .  Will check lipids, liver enzymes, basic metabolic profile today.  We will have him see an APP in  1 year.  Mertie Moores, MD  03/09/2020 10:04 AM    [address],  [place], [place]  [id number] Pager [phone] Phone: [phone]; Fax: [phone]     \

## 2020-03-11 ENCOUNTER — Other Ambulatory Visit: Payer: Self-pay

## 2020-03-11 DIAGNOSIS — I951 Orthostatic hypotension: Secondary | ICD-10-CM

## 2020-03-11 DIAGNOSIS — I2510 Atherosclerotic heart disease of native coronary artery without angina pectoris: Secondary | ICD-10-CM

## 2020-03-11 DIAGNOSIS — I5022 Chronic systolic (congestive) heart failure: Secondary | ICD-10-CM

## 2020-03-11 DIAGNOSIS — I2583 Coronary atherosclerosis due to lipid rich plaque: Secondary | ICD-10-CM

## 2020-03-11 MED ORDER — CARVEDILOL 12.5 MG PO TABS: ORAL_TABLET | ORAL | 11 refills | Status: DC

## 2020-04-07 ENCOUNTER — Ambulatory Visit (INDEPENDENT_AMBULATORY_CARE_PROVIDER_SITE_OTHER): Payer: Medicare Other | Admitting: *Deleted

## 2020-04-07 DIAGNOSIS — I472 Ventricular tachycardia, unspecified: Secondary | ICD-10-CM

## 2020-04-07 DIAGNOSIS — I5022 Chronic systolic (congestive) heart failure: Secondary | ICD-10-CM

## 2020-04-08 LAB — CUP PACEART REMOTE DEVICE CHECK
Battery Remaining Longevity: 87 mo
Battery Voltage: 3 V
Brady Statistic RV Percent Paced: 0.45 %
Date Time Interrogation Session: 20210511224225
HighPow Impedance: 42 Ohm
HighPow Impedance: 51 Ohm
Implantable Lead Implant Date: 20080108
Implantable Lead Location: 753860
Implantable Lead Model: 6947
Implantable Pulse Generator Implant Date: 20160509
Lead Channel Impedance Value: 285 Ohm
Lead Channel Impedance Value: 361 Ohm
Lead Channel Pacing Threshold Amplitude: 1.125 V
Lead Channel Pacing Threshold Pulse Width: 0.4 ms
Lead Channel Sensing Intrinsic Amplitude: 21.125 mV
Lead Channel Sensing Intrinsic Amplitude: 21.125 mV
Lead Channel Setting Pacing Amplitude: 2.25 V
Lead Channel Setting Pacing Pulse Width: 0.4 ms
Lead Channel Setting Sensing Sensitivity: 0.3 mV

## 2020-04-09 ENCOUNTER — Encounter (INDEPENDENT_AMBULATORY_CARE_PROVIDER_SITE_OTHER): Payer: Medicare Other | Admitting: Ophthalmology

## 2020-04-09 ENCOUNTER — Other Ambulatory Visit: Payer: Self-pay

## 2020-04-09 DIAGNOSIS — D3132 Benign neoplasm of left choroid: Secondary | ICD-10-CM | POA: Diagnosis not present

## 2020-04-09 DIAGNOSIS — I10 Essential (primary) hypertension: Secondary | ICD-10-CM | POA: Diagnosis not present

## 2020-04-09 DIAGNOSIS — H35033 Hypertensive retinopathy, bilateral: Secondary | ICD-10-CM | POA: Diagnosis not present

## 2020-04-09 DIAGNOSIS — H353132 Nonexudative age-related macular degeneration, bilateral, intermediate dry stage: Secondary | ICD-10-CM

## 2020-04-09 DIAGNOSIS — H43813 Vitreous degeneration, bilateral: Secondary | ICD-10-CM

## 2020-04-09 NOTE — Progress Notes (Signed)
Remote ICD transmission.   

## 2020-05-06 DIAGNOSIS — F4312 Post-traumatic stress disorder, chronic: Secondary | ICD-10-CM | POA: Diagnosis not present

## 2020-05-06 DIAGNOSIS — F900 Attention-deficit hyperactivity disorder, predominantly inattentive type: Secondary | ICD-10-CM | POA: Diagnosis not present

## 2020-05-06 DIAGNOSIS — F422 Mixed obsessional thoughts and acts: Secondary | ICD-10-CM | POA: Diagnosis not present

## 2020-05-06 DIAGNOSIS — F411 Generalized anxiety disorder: Secondary | ICD-10-CM | POA: Diagnosis not present

## 2020-05-28 ENCOUNTER — Other Ambulatory Visit: Payer: Self-pay | Admitting: Internal Medicine

## 2020-07-07 ENCOUNTER — Ambulatory Visit (INDEPENDENT_AMBULATORY_CARE_PROVIDER_SITE_OTHER): Payer: Medicare Other | Admitting: *Deleted

## 2020-07-07 DIAGNOSIS — I472 Ventricular tachycardia, unspecified: Secondary | ICD-10-CM

## 2020-07-07 LAB — CUP PACEART REMOTE DEVICE CHECK
Battery Remaining Longevity: 84 mo
Battery Voltage: 3 V
Brady Statistic RV Percent Paced: 0.13 %
Date Time Interrogation Session: 20210810033624
HighPow Impedance: 42 Ohm
HighPow Impedance: 50 Ohm
Implantable Lead Implant Date: 20080108
Implantable Lead Location: 753860
Implantable Lead Model: 6947
Implantable Pulse Generator Implant Date: 20160509
Lead Channel Impedance Value: 285 Ohm
Lead Channel Impedance Value: 342 Ohm
Lead Channel Pacing Threshold Amplitude: 1 V
Lead Channel Pacing Threshold Pulse Width: 0.4 ms
Lead Channel Sensing Intrinsic Amplitude: 22.625 mV
Lead Channel Sensing Intrinsic Amplitude: 22.625 mV
Lead Channel Setting Pacing Amplitude: 2.5 V
Lead Channel Setting Pacing Pulse Width: 0.4 ms
Lead Channel Setting Sensing Sensitivity: 0.3 mV

## 2020-07-09 NOTE — Progress Notes (Signed)
Remote ICD transmission.   

## 2020-10-15 ENCOUNTER — Ambulatory Visit (INDEPENDENT_AMBULATORY_CARE_PROVIDER_SITE_OTHER): Payer: Medicare Other

## 2020-10-15 DIAGNOSIS — I255 Ischemic cardiomyopathy: Secondary | ICD-10-CM

## 2020-10-15 LAB — CUP PACEART REMOTE DEVICE CHECK
Battery Remaining Longevity: 79 mo
Battery Voltage: 2.99 V
Brady Statistic RV Percent Paced: 0.15 %
Date Time Interrogation Session: 20211118134255
HighPow Impedance: 37 Ohm
HighPow Impedance: 43 Ohm
Implantable Lead Implant Date: 20080108
Implantable Lead Location: 753860
Implantable Lead Model: 6947
Implantable Pulse Generator Implant Date: 20160509
Lead Channel Impedance Value: 285 Ohm
Lead Channel Impedance Value: 342 Ohm
Lead Channel Pacing Threshold Amplitude: 1.25 V
Lead Channel Pacing Threshold Pulse Width: 0.4 ms
Lead Channel Sensing Intrinsic Amplitude: 17.875 mV
Lead Channel Sensing Intrinsic Amplitude: 17.875 mV
Lead Channel Setting Pacing Amplitude: 2.5 V
Lead Channel Setting Pacing Pulse Width: 0.4 ms
Lead Channel Setting Sensing Sensitivity: 0.3 mV

## 2020-10-16 NOTE — Progress Notes (Signed)
Remote ICD transmission.   

## 2020-10-19 ENCOUNTER — Telehealth: Payer: Self-pay | Admitting: Internal Medicine

## 2020-10-19 ENCOUNTER — Telehealth: Payer: Self-pay | Admitting: Cardiovascular Disease

## 2020-10-19 NOTE — Telephone Encounter (Signed)
Pt c/o Shortness Of Breath: STAT if SOB developed within the last 24 hours or pt is noticeably SOB on the phone  1. Are you currently SOB (can you hear that pt is SOB on the phone)? Ye= wife is talking for him  2. How long have you been experiencing SOB?  3 days ago  3. Are you SOB when sitting or when up moving around? movng around it is worse  4. Are you currently experiencing any other symptoms? Low fever-tested for COVID last week- was negative- pt wants to be seen

## 2020-10-19 NOTE — Progress Notes (Signed)
Cardiology Office Note   Date:  10/20/2020   ID:  [name], DOB November 11, 1946, MRN [id number]  PCP:  Hoyt Koch, MD  Cardiologist: Dr. Acie Fredrickson, MD  Chief Complaint  Patient presents with  . Shortness of Breath    History of Present Illness: [name] is a 74 y.o. male who presents for the evaluation of shortness of breath, seen for Dr. Acie Fredrickson.  [name] has a history of CAD s/p CABG, ischemic cardiomyopathy with an LVEF at 20 to 25% with ICD placement, chronic systolic CHF, history of ventricular tachycardia with ICD placement, hypertension and HLD.  He underwent a cardiac catheterization in 2017 which demonstrated occluded native arteries with patent bypass grafts.  It appears his last echocardiogram was 08/18/2015 which showed an LVEF at 20 to 25% with diffuse hypokinesis, akinesis of the apical myocardium, G1 DD, mild AR.  He was most recently seen by Dr. Lovena Le 12/12/2019 for follow-up.  He had a generator change out approximately 5 years ago.  He had no ICD shocks at that time and no complaints of shortness of breath.  ICD interrogated which showed several episodes of ventricular tachycardia which were terminated with pacing.  He had no medication changes.  He was then seen in follow-up by Dr. Acie Fredrickson 03/09/2020 in which time he continued to be doing well from a CV standpoint.  His wife then called the office on 10/19/2020 reporting patient had a 3-day history of exertional shortness of breath.  He initially felt that maybe he had COVID-19 therefore he was tested which was found to be negative.  He has had no weight gain or swelling.  BPs have been within normal range.  Today [name] presents with his wife.  He reports he has been having progressive shortness of breath, coughing and fatigue that began last Monday.  He reports a mild intermittent low-grade fever.  He has had no LE edema, orthopnea or other heart failure symptoms.  He denies chest  pain or other anginal symptoms.  He states he initially began feeling bad and given his low-grade fever, he went and had a PCR Covid test which was negative.  He reports feeling somewhat better today however is concerned given his cardiac history.  He states he follows with a PCP however they are only seeing patients virtually at this time. Given this, they made an appointment with cardiology.  There is some concern given his prior history of CAD with CABG in 1991 and a mixed cardiomyopathy with ischemic or LV assessment.  Given this, we discussed pursuing these tests.  He does not appear to be fluid volume overloaded on exam with no LE edema and a clear lung exam.  There is no wheezing.  Past Medical History:  Diagnosis Date  . Adult ADHD   . AICD (automatic cardioverter/defibrillator) present   . CAD (coronary artery disease)    Cath 2008, all grafts patent // Cath 2017 - all grafts patent   . 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 Carotid artery disease ([place])    Doppler, February, 2012, mild plaque, 0-39% bilateral, plan followup in one year  . Dyspepsia   . Ejection fraction < 50%   . Elevated PSA 02/15/2011  . Esophageal stricture   . GERD (gastroesophageal reflux disease)   . HTN (hypertension)   . Hx of CABG    1991  . Hyperlipidemia   .  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 Myocardial infarction ([place]) 1998  . Prostate cancer ([place]) 2012   IMRT, ADT  . Thrombosis of arm    Left  . Venous thrombosis    Left arm    Past Surgical History:  Procedure Laterality Date  . CARDIAC CATHETERIZATION  12/05/06  . CARDIAC CATHETERIZATION N/A 12/21/2015   Procedure: Left Heart Cath and Coronary Angiography;  Surgeon: Lorretta Harp, MD;  Location: [facility];  Service:  Cardiovascular;  Laterality: N/A;  . CORONARY ARTERY BYPASS GRAFT     LIMA_LAD, SVG-OM, Cx, RCA '91  . CYSTECTOMY     Hand  . 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 ICD Placement    . IMPLANTABLE CARDIOVERTER DEFIBRILLATOR (ICD) GENERATOR CHANGE  04/06/2015   Procedure: Icd Generator Change;  Surgeon: Evans Lance, MD;  Location: [facility];  Service: Cardiovascular;;  . menistectomies, left knee  '09  . ROTATOR CUFF REPAIR  2005, 2007  . Thumb Surgery    . TONSILLECTOMY  2005   Malignant lesion, w/XRT     Current Outpatient Medications  Medication Sig Dispense Refill  . aspirin 81 MG EC tablet Take 81 mg by mouth daily.      Marland Kitchen atorvastatin (LIPITOR) 80 MG tablet TAKE 1 TABLET(80 MG) BY MOUTH DAILY AT 6 PM 90 tablet 2  . buPROPion (WELLBUTRIN XL) 300 MG 24 hr tablet Take 300 mg by mouth daily.    . busPIRone (BUSPAR) 30 MG tablet Take 30 mg by mouth daily.     . carvedilol (COREG) 12.5 MG tablet TAKE 1 TABLET(12.5 MG) BY MOUTH TWICE DAILY 60 tablet 11  . EPINEPHrine 0.3 mg/0.3 mL IJ SOAJ injection Inject 0.3 mLs (0.3 mg total) into the muscle as needed (anaphylaxis). 2 Device 0  . escitalopram (LEXAPRO) 20 MG tablet Take 20 mg by mouth daily.      Marland Kitchen esomeprazole (NEXIUM) 20 MG packet Take 20 mg by mouth daily before breakfast.    . losartan (COZAAR) 50 MG tablet TAKE 1 TABLET BY MOUTH DAILY 90 tablet 2  . Multiple Vitamins-Minerals (OCUVITE PRESERVISION) TABS Take 1 tablet by mouth daily.     . nitroGLYCERIN (NITROSTAT) 0.4 MG SL tablet Place 1 tablet (0.4 mg total) under the tongue every 5 (five) minutes as needed for chest pain. 25 tablet 12  . zolpidem (AMBIEN) 10 MG tablet Take 1 tablet by mouth at bedtime as needed for sleep.      No current facility-administered medications for this visit.    Allergies:   Other    Social History:  The patient  reports that he has never smoked. He has never used smokeless tobacco.  He reports that he does not drink alcohol and does not use drugs.   Family History:  The patient's family history includes Alzheimer's disease in his mother; Breast cancer in his sister; CAD in his father; Congestive Heart Failure in his father; Dementia in his mother; Heart disease in his brother; Hyperlipidemia in his father; Hypertension in his father.    ROS:  Please see the history of present illness.   Otherwise, review of systems are positive for none. All other systems are reviewed and negative.   PHYSICAL EXAM: VS:  BP 136/80   Pulse 73  Ht 6' (1.829 m)   Wt 183 lb (83 kg)   SpO2 98%   BMI 24.82 kg/m  , BMI Body mass index is 24.82 kg/m.   General: Well developed, well nourished, NAD Skin: Warm, dry, intact  Lungs:Clear to ausculation bilaterally. No wheezes, rales, or rhonchi. Breathing is unlabored. Cardiovascular: RRR with S1 S2. No murmurs Extremities: No edema. Neuro: Alert and oriented. MAE spontaneously. Psych: Responds to questions appropriately with normal affect.    EKG:  EKG is not ordered today.  Recent Labs: 03/09/2020: ALT 9; BUN 12; Creatinine, Ser 1.54; Potassium 3.9; Sodium 142   Lipid Panel    Component Value Date/Time   CHOL 152 03/09/2020 1019   TRIG 88 03/09/2020 1019   HDL 61 03/09/2020 1019   CHOLHDL 2.5 03/09/2020 1019   CHOLHDL 2.5 11/01/2016 1100   VLDL 27 11/01/2016 1100   LDLCALC 75 03/09/2020 1019    Wt Readings from Last 3 Encounters:  10/20/20 183 lb (83 kg)  03/09/20 182 lb 3.2 oz (82.6 kg)  12/12/19 191 lb 6.4 oz (86.8 kg)    Other studies Reviewed: Additional studies/ records that were reviewed today include:  Review of the above records demonstrates:   [facility] 12/21/2015:   Ost LAD to Prox LAD lesion, 100% stenosed.  Prox Cx to Mid Cx lesion, 100% stenosed.  Ost RCA to Prox RCA lesion, 100% stenosed.  Echo 08/18/2015:  - Left ventricle: The cavity size was normal. Wall thickness was  normal. Systolic function was  severely reduced. The estimated  ejection fraction was in the range of 20% to 25%. Diffuse  hypokinesis. Akinesis of the apicalapical myocardium. Doppler  parameters are consistent with abnormal left ventricular  relaxation (grade 1 diastolic dysfunction).  - Aortic valve: There was mild regurgitation.  - Left atrium: The atrium was severely dilated.  - Right atrium: The atrium was mildly to moderately dilated.   ASSESSMENT AND PLAN:  1.  Shortness of breath: -Patient reports shortness of breath, coughing and fatigue with a low-grade temp began last Monday.  Out of concern, he went for PCR Covid testing which was negative.  Unfortunately his symptoms have persisted.  He does report feeling better today.  Lungs are clear on exam with no wheezing.  He has no LE edema and no evidence of fluid volume overload.  I am concerned given his prior history of CABG in 1991 and ischemic cardiomyopathy with no recent assessment that this may be cardiac in etiology.  He denies chest pain however we will pursue an echocardiogram and a Lexiscan stress test for further event.  -Obtain CXR to ensure PNA -Lab work to include CBC, BMET -There is no utility for Lasix or inhalers at this time -Plan for close follow-up  2.  History of chronic systolic CHF: -Last echocardiogram in 2016 with LVEF at 20 to 45% with mild AR and G1 DD -Repeat echocardiogram -No fluid volume overload on exam  3.  CAD s/p CABG: -Last cardiac catheterization in 2017 with occluded native arteries and patent grafts with LIMA to LAD and SVG to the marginal system and to the RCA -Denies anginal symptoms -Continue ASA, losartan, carvedilol, statin  4.  History of VT with ICD placement: -Recently seen by Dr. Lovena Le 12/12/2019 at which time ICD was interrogated with several episodes of VT which were terminated with device.  No medication changes were made at that time -Denies palpitations  5.  HTN: -Stable, 136/80 -Continue  current regimen with losartan, carvedilol  6.  HLD: -Not discussed  Current medicines are reviewed at length with the patient today.  The patient does not have concerns regarding medicines.  The following changes have been made:  no change  Labs/ tests ordered today include: BMET, CBC, Lexiscan stress test and echocardiogram   No orders of the defined types were placed in this encounter.  Disposition:   FU with Dr. Acie Fredrickson or myself in 2 weeks  Signed, Kathyrn Drown, NP  10/20/2020 8:54 AM    [address], [place], [place]  [address] Phone: [phone]; Fax: [phone]

## 2020-10-19 NOTE — Telephone Encounter (Signed)
Spoke with the patient's wife who states that the patient has been having increased SOB recently. Denies any chest pain. No weight gain or swelling. She states his BP has been fine. Does not have a pulse ox. He started feeling bad last week and had a COVID test that was negative. Wife states that SOB worsens with exertion but he does have trouble speaking a whole sentence without getting SOB.  Patient is scheduled to see Kathyrn Drown 11/23.

## 2020-10-19 NOTE — Telephone Encounter (Signed)
Vera with [facility] called and said that the patient has a productive cough, temp of 99.4, having a hard time breathing. He was recommended to go to the ED or call 911 and refused. Please call the patient at [phone].

## 2020-10-19 NOTE — Telephone Encounter (Signed)
For SOB I would also recommend ER or urgent care.

## 2020-10-20 ENCOUNTER — Other Ambulatory Visit: Payer: Self-pay

## 2020-10-20 ENCOUNTER — Ambulatory Visit
Admission: RE | Admit: 2020-10-20 | Discharge: 2020-10-20 | Disposition: A | Discharge status: Home / Self Care | Payer: Medicare Other | Source: Ambulatory Visit | Attending: Cardiology | Admitting: Cardiology

## 2020-10-20 ENCOUNTER — Encounter: Payer: Self-pay | Admitting: Cardiology

## 2020-10-20 ENCOUNTER — Ambulatory Visit: Payer: Medicare Other | Admitting: Cardiology

## 2020-10-20 VITALS — BP 136/80 | HR 73 | Ht 72.0 in | Wt 183.0 lb

## 2020-10-20 DIAGNOSIS — R0602 Shortness of breath: Secondary | ICD-10-CM

## 2020-10-20 DIAGNOSIS — J439 Emphysema, unspecified: Secondary | ICD-10-CM | POA: Diagnosis not present

## 2020-10-20 DIAGNOSIS — I5022 Chronic systolic (congestive) heart failure: Secondary | ICD-10-CM

## 2020-10-20 DIAGNOSIS — I255 Ischemic cardiomyopathy: Secondary | ICD-10-CM | POA: Diagnosis not present

## 2020-10-20 DIAGNOSIS — Z95810 Presence of automatic (implantable) cardiac defibrillator: Secondary | ICD-10-CM | POA: Diagnosis not present

## 2020-10-20 DIAGNOSIS — Z951 Presence of aortocoronary bypass graft: Secondary | ICD-10-CM | POA: Diagnosis not present

## 2020-10-20 DIAGNOSIS — I10 Essential (primary) hypertension: Secondary | ICD-10-CM

## 2020-10-20 DIAGNOSIS — I2510 Atherosclerotic heart disease of native coronary artery without angina pectoris: Secondary | ICD-10-CM

## 2020-10-20 LAB — BASIC METABOLIC PANEL
BUN/Creatinine Ratio: 20 (ref 10–24)
BUN: 22 mg/dL (ref 8–27)
CO2: 22 mmol/L (ref 20–29)
Calcium: 8.9 mg/dL (ref 8.6–10.2)
Chloride: 103 mmol/L (ref 96–106)
Creatinine, Ser: 1.12 mg/dL (ref 0.76–1.27)
GFR calc Af Amer: 75 mL/min/{1.73_m2} (ref >59)
GFR calc non Af Amer: 65 mL/min/{1.73_m2} (ref >59)
Glucose: 97 mg/dL (ref 65–99)
Potassium: 3.8 mmol/L (ref 3.5–5.2)
Sodium: 139 mmol/L (ref 134–144)

## 2020-10-20 LAB — CBC
Hematocrit: 37.1 % — ABNORMAL LOW (ref 37.5–51.0)
Hemoglobin: 12.3 g/dL — ABNORMAL LOW (ref 13.0–17.7)
MCH: 28.6 pg (ref 26.6–33.0)
MCHC: 33.2 g/dL (ref 31.5–35.7)
MCV: 86 fL (ref 79–97)
Platelets: 250 10*3/uL (ref 150–450)
RBC: 4.3 x10E6/uL (ref 4.14–5.80)
RDW: 13.8 % (ref 11.6–15.4)
WBC: 7.4 10*3/uL (ref 3.4–10.8)

## 2020-10-20 NOTE — Patient Instructions (Signed)
Medication Instructions:  Your physician recommends that you continue on your current medications as directed. Please refer to the Current Medication list given to you today.  *If you need a refill on your cardiac medications before your next appointment, please call your pharmacy*   Lab Work: TODAY: BMET, CBC If you have labs (blood work) drawn today and your tests are completely normal, you will receive your results only by: Marland Kitchen MyChart Message (if you have MyChart) OR . A paper copy in the mail If you have any lab test that is abnormal or we need to change your treatment, we will call you to review the results.   Testing/Procedures: Your physician has requested that you have an echocardiogram. Echocardiography is a painless test that uses sound waves to create images of your heart. It provides your doctor with information about the size and shape of your heart and how well your heart's chambers and valves are working. This procedure takes approximately one hour. There are no restrictions for this procedure.  Your physician has requested that you have a lexiscan myoview. For further information please visit [email or web address]. Please follow instruction sheet, as given.  A chest x-ray takes a picture of the organs and structures inside the chest, including the heart, lungs, and blood vessels. This test can show several things, including, whether the heart is enlarges; whether fluid is building up in the lungs; and whether pacemaker / defibrillator leads are still in place. TO BE DONE AT [facility] [place]. [address]  Follow-Up: At [facility], you and your health needs are our priority.  As part of our continuing mission to provide you with exceptional heart care, we have created designated Provider Care Teams.  These Care Teams include your primary Cardiologist (physician) and Advanced Practice Providers (APPs -  Physician Assistants and Nurse Practitioners) who all  work together to provide you with the care you need, when you need it.  We recommend signing up for the patient portal called "MyChart".  Sign up information is provided on this After Visit Summary.  MyChart is used to connect with patients for Virtual Visits (Telemedicine).  Patients are able to view lab/test results, encounter notes, upcoming appointments, etc.  Non-urgent messages can be sent to your provider as well.   To learn more about what you can do with MyChart, go to [email or web address].    Your next appointment:   2 week(s)  The format for your next appointment:   In Person  Provider:   You may see Mertie Moores, MD or one of the following Advanced Practice Providers on your designated Care Team:    Richardson Dopp, PA-C  [place], [place]    Other Instructions  Echocardiogram An echocardiogram is a procedure that uses painless sound waves (ultrasound) to produce an image of the heart. Images from an echocardiogram can provide important information about:  Signs of coronary artery disease (CAD).  Aneurysm detection. An aneurysm is a weak or damaged part of an artery wall that bulges out from the normal force of blood pumping through the body.  Heart size and shape. Changes in the size or shape of the heart can be associated with certain conditions, including heart failure, aneurysm, and CAD.  Heart muscle function.  Heart valve function.  Signs of a past heart attack.  Fluid buildup around the heart.  Thickening of the heart muscle.  A tumor or infectious growth around the heart valves. Tell a health care provider about:  Any allergies you have.  All medicines you are taking, including vitamins, herbs, eye drops, creams, and over-the-counter medicines.  Any blood disorders you have.  Any surgeries you have had.  Any medical conditions you have.  Whether you are pregnant or may be pregnant. What are the risks? Generally, this is a safe procedure.  However, problems may occur, including:  Allergic reaction to dye (contrast) that may be used during the procedure. What happens before the procedure? No specific preparation is needed. You may eat and drink normally. What happens during the procedure?   An IV tube may be inserted into one of your veins.  You may receive contrast through this tube. A contrast is an injection that improves the quality of the pictures from your heart.  A gel will be applied to your chest.  A wand-like tool (transducer) will be moved over your chest. The gel will help to transmit the sound waves from the transducer.  The sound waves will harmlessly bounce off of your heart to allow the heart images to be captured in real-time motion. The images will be recorded on a computer. The procedure may vary among health care providers and hospitals. What happens after the procedure?  You may return to your normal, everyday life, including diet, activities, and medicines, unless your health care provider tells you not to do that. Summary  An echocardiogram is a procedure that uses painless sound waves (ultrasound) to produce an image of the heart.  Images from an echocardiogram can provide important information about the size and shape of your heart, heart muscle function, heart valve function, and fluid buildup around your heart.  You do not need to do anything to prepare before this procedure. You may eat and drink normally.  After the echocardiogram is completed, you may return to your normal, everyday life, unless your health care provider tells you not to do that. This information is not intended to replace advice given to you by your health care provider. Make sure you discuss any questions you have with your health care provider. Document Revised: 03/07/2019 Document Reviewed: 12/17/2016 Elsevier Patient Education  [place].

## 2020-10-20 NOTE — Telephone Encounter (Signed)
Pt states he feels about the same today.  He had a visit cardiologist today:  Today [name] presents with his wife.  He reports he has been having progressive shortness of breath, coughing and fatigue that began last Monday.  He reports a mild intermittent low-grade fever.  He has had no LE edema, orthopnea or other heart failure symptoms.  He denies chest pain or other anginal symptoms.  He states he initially began feeling bad and given his low-grade fever, he went and had a PCR Covid test which was negative.  He reports feeling somewhat better today however is concerned given his cardiac history.  He states he follows with a PCP however they are only seeing patients virtually at this time. Given this, they made an appointment with cardiology.  There is some concern given his prior history of CAD with CABG in 1991 and a mixed cardiomyopathy with ischemic or LV assessment.  Given this, we discussed pursuing these tests.  He does not appear to be fluid volume overloaded on exam with no LE edema and a clear lung exam.  There is no wheezing.  Current medicines are reviewed at length with the patient today.  The patient does not have concerns regarding medicines.  The following changes have been made:  no change  Labs/ tests ordered today include: BMET, CBC, Lexiscan stress test and echocardiogram   No orders of the defined types were placed in this encounter.  Disposition:   FU with Dr. Acie Fredrickson or myself in 2 weeks

## 2020-10-21 ENCOUNTER — Ambulatory Visit: Payer: Medicare Other | Admitting: Physician Assistant

## 2020-10-23 ENCOUNTER — Encounter (HOSPITAL_COMMUNITY): Payer: Self-pay | Admitting: *Deleted

## 2020-10-23 ENCOUNTER — Encounter (HOSPITAL_COMMUNITY)
Admission: EM | Disposition: A | Discharge status: Home / Self Care | Payer: Self-pay | Source: Home / Self Care | Attending: Cardiology

## 2020-10-23 ENCOUNTER — Inpatient Hospital Stay (HOSPITAL_COMMUNITY)
Admission: EM | Admit: 2020-10-23 | Discharge: 2020-10-25 | DRG: 287 | Disposition: A | Discharge status: Home / Self Care | Payer: Medicare Other | Attending: Cardiology | Admitting: Cardiology

## 2020-10-23 ENCOUNTER — Emergency Department (HOSPITAL_COMMUNITY): Payer: Medicare Other

## 2020-10-23 ENCOUNTER — Other Ambulatory Visit: Payer: Self-pay

## 2020-10-23 DIAGNOSIS — E785 Hyperlipidemia, unspecified: Secondary | ICD-10-CM | POA: Diagnosis not present

## 2020-10-23 DIAGNOSIS — Z85818 Personal history of malignant neoplasm of other sites of lip, oral cavity, and pharynx: Secondary | ICD-10-CM

## 2020-10-23 DIAGNOSIS — T82897A Other specified complication of cardiac prosthetic devices, implants and grafts, initial encounter: Secondary | ICD-10-CM | POA: Diagnosis not present

## 2020-10-23 DIAGNOSIS — I255 Ischemic cardiomyopathy: Secondary | ICD-10-CM | POA: Diagnosis present

## 2020-10-23 DIAGNOSIS — Z7982 Long term (current) use of aspirin: Secondary | ICD-10-CM | POA: Diagnosis not present

## 2020-10-23 DIAGNOSIS — Z951 Presence of aortocoronary bypass graft: Secondary | ICD-10-CM | POA: Diagnosis not present

## 2020-10-23 DIAGNOSIS — R0600 Dyspnea, unspecified: Secondary | ICD-10-CM | POA: Diagnosis present

## 2020-10-23 DIAGNOSIS — Z8546 Personal history of malignant neoplasm of prostate: Secondary | ICD-10-CM

## 2020-10-23 DIAGNOSIS — Z8249 Family history of ischemic heart disease and other diseases of the circulatory system: Secondary | ICD-10-CM

## 2020-10-23 DIAGNOSIS — Z95810 Presence of automatic (implantable) cardiac defibrillator: Secondary | ICD-10-CM | POA: Diagnosis present

## 2020-10-23 DIAGNOSIS — R531 Weakness: Secondary | ICD-10-CM | POA: Diagnosis present

## 2020-10-23 DIAGNOSIS — I472 Ventricular tachycardia, unspecified: Secondary | ICD-10-CM

## 2020-10-23 DIAGNOSIS — Z20822 Contact with and (suspected) exposure to covid-19: Secondary | ICD-10-CM | POA: Diagnosis not present

## 2020-10-23 DIAGNOSIS — I2510 Atherosclerotic heart disease of native coronary artery without angina pectoris: Secondary | ICD-10-CM | POA: Diagnosis not present

## 2020-10-23 DIAGNOSIS — I2584 Coronary atherosclerosis due to calcified coronary lesion: Secondary | ICD-10-CM | POA: Diagnosis present

## 2020-10-23 DIAGNOSIS — I5042 Chronic combined systolic (congestive) and diastolic (congestive) heart failure: Secondary | ICD-10-CM | POA: Diagnosis present

## 2020-10-23 DIAGNOSIS — R42 Dizziness and giddiness: Secondary | ICD-10-CM | POA: Diagnosis present

## 2020-10-23 DIAGNOSIS — Z79899 Other long term (current) drug therapy: Secondary | ICD-10-CM

## 2020-10-23 DIAGNOSIS — J069 Acute upper respiratory infection, unspecified: Secondary | ICD-10-CM | POA: Diagnosis present

## 2020-10-23 DIAGNOSIS — Z4502 Encounter for adjustment and management of automatic implantable cardiac defibrillator: Secondary | ICD-10-CM

## 2020-10-23 DIAGNOSIS — I517 Cardiomegaly: Secondary | ICD-10-CM | POA: Diagnosis not present

## 2020-10-23 DIAGNOSIS — I5043 Acute on chronic combined systolic (congestive) and diastolic (congestive) heart failure: Secondary | ICD-10-CM | POA: Diagnosis present

## 2020-10-23 DIAGNOSIS — I10 Essential (primary) hypertension: Secondary | ICD-10-CM | POA: Diagnosis present

## 2020-10-23 DIAGNOSIS — I252 Old myocardial infarction: Secondary | ICD-10-CM

## 2020-10-23 DIAGNOSIS — I25118 Atherosclerotic heart disease of native coronary artery with other forms of angina pectoris: Secondary | ICD-10-CM | POA: Diagnosis not present

## 2020-10-23 DIAGNOSIS — J9811 Atelectasis: Secondary | ICD-10-CM | POA: Diagnosis not present

## 2020-10-23 DIAGNOSIS — F909 Attention-deficit hyperactivity disorder, unspecified type: Secondary | ICD-10-CM | POA: Diagnosis not present

## 2020-10-23 DIAGNOSIS — I4510 Unspecified right bundle-branch block: Secondary | ICD-10-CM | POA: Diagnosis not present

## 2020-10-23 DIAGNOSIS — I110 Hypertensive heart disease with heart failure: Secondary | ICD-10-CM | POA: Diagnosis not present

## 2020-10-23 DIAGNOSIS — Z820 Family history of epilepsy and other diseases of the nervous system: Secondary | ICD-10-CM

## 2020-10-23 DIAGNOSIS — I493 Ventricular premature depolarization: Secondary | ICD-10-CM | POA: Diagnosis not present

## 2020-10-23 DIAGNOSIS — Z83438 Family history of other disorder of lipoprotein metabolism and other lipidemia: Secondary | ICD-10-CM | POA: Diagnosis not present

## 2020-10-23 DIAGNOSIS — E876 Hypokalemia: Secondary | ICD-10-CM | POA: Diagnosis present

## 2020-10-23 DIAGNOSIS — K219 Gastro-esophageal reflux disease without esophagitis: Secondary | ICD-10-CM | POA: Diagnosis not present

## 2020-10-23 DIAGNOSIS — R0609 Other forms of dyspnea: Secondary | ICD-10-CM | POA: Diagnosis present

## 2020-10-23 DIAGNOSIS — Z803 Family history of malignant neoplasm of breast: Secondary | ICD-10-CM

## 2020-10-23 DIAGNOSIS — R9431 Abnormal electrocardiogram [ECG] [EKG]: Secondary | ICD-10-CM | POA: Diagnosis not present

## 2020-10-23 HISTORY — PX: LEFT HEART CATH AND CORS/GRAFTS ANGIOGRAPHY: CATH118250

## 2020-10-23 LAB — I-STAT CHEM 8, ED
BUN: 20 mg/dL (ref 8–23)
Calcium, Ion: 1.1 mmol/L — ABNORMAL LOW (ref 1.15–1.40)
Chloride: 106 mmol/L (ref 98–111)
Creatinine, Ser: 1.1 mg/dL (ref 0.61–1.24)
Glucose, Bld: 110 mg/dL — ABNORMAL HIGH (ref 70–99)
HCT: 37 % — ABNORMAL LOW (ref 39.0–52.0)
Hemoglobin: 12.6 g/dL — ABNORMAL LOW (ref 13.0–17.0)
Potassium: 3.5 mmol/L (ref 3.5–5.1)
Sodium: 144 mmol/L (ref 135–145)
TCO2: 23 mmol/L (ref 22–32)

## 2020-10-23 LAB — CBC
HCT: 39.7 % (ref 39.0–52.0)
Hemoglobin: 12.4 g/dL — ABNORMAL LOW (ref 13.0–17.0)
MCH: 28.1 pg (ref 26.0–34.0)
MCHC: 31.2 g/dL (ref 30.0–36.0)
MCV: 89.8 fL (ref 80.0–100.0)
Platelets: 234 10*3/uL (ref 150–400)
RBC: 4.42 MIL/uL (ref 4.22–5.81)
RDW: 14.4 % (ref 11.5–15.5)
WBC: 5.7 10*3/uL (ref 4.0–10.5)
nRBC: 0 % (ref 0.0–0.2)

## 2020-10-23 LAB — MAGNESIUM: Magnesium: 1.9 mg/dL (ref 1.7–2.4)

## 2020-10-23 LAB — BASIC METABOLIC PANEL
Anion gap: 10 (ref 5–15)
BUN: 16 mg/dL (ref 8–23)
CO2: 22 mmol/L (ref 22–32)
Calcium: 8.4 mg/dL — ABNORMAL LOW (ref 8.9–10.3)
Chloride: 108 mmol/L (ref 98–111)
Creatinine, Ser: 1.27 mg/dL — ABNORMAL HIGH (ref 0.61–1.24)
GFR, Estimated: >60 mL/min (ref >60)
Glucose, Bld: 114 mg/dL — ABNORMAL HIGH (ref 70–99)
Potassium: 3.5 mmol/L (ref 3.5–5.1)
Sodium: 140 mmol/L (ref 135–145)

## 2020-10-23 LAB — CBC WITH DIFFERENTIAL/PLATELET
Abs Immature Granulocytes: 0.02 10*3/uL (ref 0.00–0.07)
Basophils Absolute: 0.1 10*3/uL (ref 0.0–0.1)
Basophils Relative: 1 %
Eosinophils Absolute: 0.2 10*3/uL (ref 0.0–0.5)
Eosinophils Relative: 3 %
HCT: 36.5 % — ABNORMAL LOW (ref 39.0–52.0)
Hemoglobin: 11.7 g/dL — ABNORMAL LOW (ref 13.0–17.0)
Immature Granulocytes: 0 %
Lymphocytes Relative: 12 %
Lymphs Abs: 0.8 10*3/uL (ref 0.7–4.0)
MCH: 29.7 pg (ref 26.0–34.0)
MCHC: 32.1 g/dL (ref 30.0–36.0)
MCV: 92.6 fL (ref 80.0–100.0)
Monocytes Absolute: 0.5 10*3/uL (ref 0.1–1.0)
Monocytes Relative: 8 %
Neutro Abs: 5.1 10*3/uL (ref 1.7–7.7)
Neutrophils Relative %: 76 %
Platelets: 226 10*3/uL (ref 150–400)
RBC: 3.94 MIL/uL — ABNORMAL LOW (ref 4.22–5.81)
RDW: 14.4 % (ref 11.5–15.5)
WBC: 6.6 10*3/uL (ref 4.0–10.5)
nRBC: 0 % (ref 0.0–0.2)

## 2020-10-23 LAB — TROPONIN I (HIGH SENSITIVITY)
Troponin I (High Sensitivity): 55 ng/L — ABNORMAL HIGH (ref <18)
Troponin I (High Sensitivity): 84 ng/L — ABNORMAL HIGH (ref <18)

## 2020-10-23 LAB — CREATININE, SERUM
Creatinine, Ser: 1.14 mg/dL (ref 0.61–1.24)
GFR, Estimated: >60 mL/min (ref >60)

## 2020-10-23 LAB — RESP PANEL BY RT-PCR (FLU A&B, COVID) ARPGX2
Influenza A by PCR: NEGATIVE
Influenza B by PCR: NEGATIVE
SARS Coronavirus 2 by RT PCR: NEGATIVE

## 2020-10-23 LAB — TSH: TSH: 1.858 u[IU]/mL (ref 0.350–4.500)

## 2020-10-23 SURGERY — LEFT HEART CATH AND CORS/GRAFTS ANGIOGRAPHY
Anesthesia: LOCAL

## 2020-10-23 MED ORDER — ACETAMINOPHEN 325 MG PO TABS: 650.0000 mg | ORAL_TABLET | ORAL | Status: DC | PRN

## 2020-10-23 MED ORDER — ASPIRIN EC 81 MG PO TBEC: 81.0000 mg | DELAYED_RELEASE_TABLET | Freq: Every day | ORAL | Status: DC

## 2020-10-23 MED ORDER — AMIODARONE HCL IN DEXTROSE 360-4.14 MG/200ML-% IV SOLN
60.0000 mg/h | INTRAVENOUS | Status: AC
  Administered 2020-10-23: 60 mg/h via INTRAVENOUS
  Filled 2020-10-23: qty 200

## 2020-10-23 MED ORDER — AMIODARONE LOAD VIA INFUSION
150.0000 mg | Freq: Once | INTRAVENOUS | Status: AC
  Administered 2020-10-23 (×2): 150 mg via INTRAVENOUS
  Filled 2020-10-23: qty 83.34

## 2020-10-23 MED ORDER — IOHEXOL 350 MG/ML SOLN
INTRAVENOUS | Status: DC | PRN
  Administered 2020-10-23: 70 mL via INTRA_ARTERIAL

## 2020-10-23 MED ORDER — LOSARTAN POTASSIUM 50 MG PO TABS
50.0000 mg | ORAL_TABLET | Freq: Every day | ORAL | Status: DC
  Administered 2020-10-23: 50 mg via ORAL
  Filled 2020-10-23: qty 1

## 2020-10-23 MED ORDER — FUROSEMIDE 10 MG/ML IJ SOLN
INTRAMUSCULAR | Status: AC
  Filled 2020-10-23: qty 4

## 2020-10-23 MED ORDER — POTASSIUM CHLORIDE CRYS ER 20 MEQ PO TBCR
40.0000 meq | EXTENDED_RELEASE_TABLET | Freq: Once | ORAL | Status: AC
  Administered 2020-10-23: 40 meq via ORAL
  Filled 2020-10-23: qty 2

## 2020-10-23 MED ORDER — CARVEDILOL 12.5 MG PO TABS
12.5000 mg | ORAL_TABLET | Freq: Two times a day (BID) | ORAL | Status: DC
  Administered 2020-10-23 – 2020-10-24 (×3): 12.5 mg via ORAL
  Filled 2020-10-23 (×4): qty 1

## 2020-10-23 MED ORDER — ONDANSETRON HCL 4 MG/2ML IJ SOLN: 4.0000 mg | Freq: Four times a day (QID) | INTRAMUSCULAR | Status: DC | PRN

## 2020-10-23 MED ORDER — LABETALOL HCL 5 MG/ML IV SOLN: 10.0000 mg | INTRAVENOUS | Status: AC | PRN

## 2020-10-23 MED ORDER — MIDAZOLAM HCL 2 MG/2ML IJ SOLN
INTRAMUSCULAR | Status: DC | PRN
  Administered 2020-10-23: 2 mg via INTRAVENOUS

## 2020-10-23 MED ORDER — SODIUM CHLORIDE 0.9% FLUSH
3.0000 mL | Freq: Two times a day (BID) | INTRAVENOUS | Status: DC
  Administered 2020-10-24 (×2): 3 mL via INTRAVENOUS

## 2020-10-23 MED ORDER — AMIODARONE HCL IN DEXTROSE 360-4.14 MG/200ML-% IV SOLN
30.0000 mg/h | INTRAVENOUS | Status: DC
  Administered 2020-10-23 – 2020-10-24 (×3): 30 mg/h via INTRAVENOUS
  Filled 2020-10-23 (×2): qty 200

## 2020-10-23 MED ORDER — SODIUM CHLORIDE 0.9% FLUSH: 3.0000 mL | INTRAVENOUS | Status: DC | PRN

## 2020-10-23 MED ORDER — FENTANYL CITRATE (PF) 100 MCG/2ML IJ SOLN
INTRAMUSCULAR | Status: AC
  Filled 2020-10-23: qty 2

## 2020-10-23 MED ORDER — HEPARIN (PORCINE) IN NACL 1000-0.9 UT/500ML-% IV SOLN
INTRAVENOUS | Status: AC
  Filled 2020-10-23: qty 1000

## 2020-10-23 MED ORDER — NITROGLYCERIN 0.4 MG SL SUBL: 0.4000 mg | SUBLINGUAL_TABLET | SUBLINGUAL | Status: DC | PRN

## 2020-10-23 MED ORDER — HEPARIN (PORCINE) IN NACL 1000-0.9 UT/500ML-% IV SOLN
INTRAVENOUS | Status: DC | PRN
  Administered 2020-10-23 (×2): 500 mL

## 2020-10-23 MED ORDER — ZOLPIDEM TARTRATE 5 MG PO TABS
5.0000 mg | ORAL_TABLET | Freq: Every evening | ORAL | Status: DC | PRN
  Administered 2020-10-24: 5 mg via ORAL
  Filled 2020-10-23: qty 1

## 2020-10-23 MED ORDER — IOHEXOL 350 MG/ML SOLN
INTRAVENOUS | Status: AC
  Filled 2020-10-23: qty 1

## 2020-10-23 MED ORDER — HYDRALAZINE HCL 20 MG/ML IJ SOLN: 10.0000 mg | INTRAMUSCULAR | Status: AC | PRN

## 2020-10-23 MED ORDER — AMIODARONE HCL 150 MG/3ML IV SOLN
150.0000 mg | Freq: Once | INTRAVENOUS | Status: DC
Start: 2020-10-23 — End: 2020-10-23

## 2020-10-23 MED ORDER — MIDAZOLAM HCL 2 MG/2ML IJ SOLN
INTRAMUSCULAR | Status: AC
  Filled 2020-10-23: qty 2

## 2020-10-23 MED ORDER — SODIUM CHLORIDE 0.9 % IV SOLN: INTRAVENOUS | Status: DC

## 2020-10-23 MED ORDER — ESOMEPRAZOLE MAGNESIUM 20 MG PO CPDR
20.0000 mg | DELAYED_RELEASE_CAPSULE | Freq: Every day | ORAL | Status: DC
  Administered 2020-10-24: 20 mg via ORAL
  Filled 2020-10-23 (×2): qty 1

## 2020-10-23 MED ORDER — SODIUM CHLORIDE 0.9 % IV SOLN: 250.0000 mL | INTRAVENOUS | Status: DC | PRN

## 2020-10-23 MED ORDER — PROSIGHT PO TABS
1.0000 | ORAL_TABLET | Freq: Every day | ORAL | Status: DC
  Administered 2020-10-23 – 2020-10-24 (×2): 1 via ORAL
  Filled 2020-10-23 (×4): qty 1

## 2020-10-23 MED ORDER — HEPARIN SODIUM (PORCINE) 5000 UNIT/ML IJ SOLN
5000.0000 [IU] | Freq: Three times a day (TID) | INTRAMUSCULAR | Status: DC
  Administered 2020-10-23 – 2020-10-24 (×4): 5000 [IU] via SUBCUTANEOUS
  Filled 2020-10-23 (×5): qty 1

## 2020-10-23 MED ORDER — FUROSEMIDE 10 MG/ML IJ SOLN
40.0000 mg | Freq: Once | INTRAMUSCULAR | Status: AC
  Administered 2020-10-23: 40 mg via INTRAVENOUS

## 2020-10-23 MED ORDER — FENTANYL CITRATE (PF) 100 MCG/2ML IJ SOLN
INTRAMUSCULAR | Status: DC | PRN
  Administered 2020-10-23: 25 ug via INTRAVENOUS

## 2020-10-23 MED ORDER — BUPROPION HCL ER (XL) 150 MG PO TB24
300.0000 mg | ORAL_TABLET | Freq: Every day | ORAL | Status: DC
  Administered 2020-10-23 – 2020-10-24 (×2): 300 mg via ORAL
  Filled 2020-10-23 (×3): qty 2

## 2020-10-23 MED ORDER — LIDOCAINE HCL (PF) 1 % IJ SOLN
INTRAMUSCULAR | Status: DC | PRN
  Administered 2020-10-23: 10 mL

## 2020-10-23 MED ORDER — AMIODARONE HCL IN DEXTROSE 360-4.14 MG/200ML-% IV SOLN
INTRAVENOUS | Status: AC
  Filled 2020-10-23: qty 200

## 2020-10-23 MED ORDER — LIDOCAINE HCL (PF) 1 % IJ SOLN
INTRAMUSCULAR | Status: AC
  Filled 2020-10-23: qty 30

## 2020-10-23 MED ORDER — ASPIRIN 300 MG RE SUPP
300.0000 mg | RECTAL | Status: AC
  Filled 2020-10-23: qty 1

## 2020-10-23 MED ORDER — ATORVASTATIN CALCIUM 80 MG PO TABS
80.0000 mg | ORAL_TABLET | Freq: Every evening | ORAL | Status: DC
  Administered 2020-10-23 – 2020-10-24 (×2): 80 mg via ORAL
  Filled 2020-10-23 (×2): qty 1

## 2020-10-23 MED ORDER — ASPIRIN 81 MG PO CHEW
324.0000 mg | CHEWABLE_TABLET | ORAL | Status: AC
  Filled 2020-10-23: qty 4

## 2020-10-23 MED ORDER — ASPIRIN EC 81 MG PO TBEC
81.0000 mg | DELAYED_RELEASE_TABLET | Freq: Every day | ORAL | Status: DC
  Administered 2020-10-24: 81 mg via ORAL
  Filled 2020-10-23 (×3): qty 1

## 2020-10-23 MED ORDER — ESCITALOPRAM OXALATE 10 MG PO TABS: 20.0000 mg | ORAL_TABLET | Freq: Every day | ORAL | Status: DC

## 2020-10-23 SURGICAL SUPPLY — 12 items
CATH INFINITI 5 FR IM (CATHETERS) ×2 IMPLANT
CATH INFINITI 5FR MULTPACK ANG (CATHETERS) ×2 IMPLANT
CLOSURE MYNX CONTROL 5F (Vascular Products) ×2 IMPLANT
ELECT DEFIB PAD ADLT CADENCE (PAD) ×2 IMPLANT
KIT HEART LEFT (KITS) ×2 IMPLANT
PACK CARDIAC CATHETERIZATION (CUSTOM PROCEDURE TRAY) ×2 IMPLANT
SHEATH PINNACLE 5F 10CM (SHEATH) ×2 IMPLANT
SHEATH PROBE COVER 6X72 (BAG) ×2 IMPLANT
TRANSDUCER W/STOPCOCK (MISCELLANEOUS) ×2 IMPLANT
TUBING CIL FLEX 10 FLL-RA (TUBING) ×2 IMPLANT
WIRE EMERALD 3MM-J .035X150CM (WIRE) ×2 IMPLANT
WIRE HI TORQ VERSACORE-J 145CM (WIRE) ×2 IMPLANT

## 2020-10-23 NOTE — ED Notes (Signed)
Cardiology at bedside.

## 2020-10-23 NOTE — ED Notes (Signed)
Patient has a [employer] AICD , interrogated at bedside. Waiting results.

## 2020-10-23 NOTE — ED Triage Notes (Signed)
Patient states he was walking in his house this am started to feel dizzy and lightheaded and his AICD fired and states he saw a light ,  And approx. 30 mins. Later it fired again. States he hasn't felt well for the past week generalized malaise with cold sx. States he is fully vaccinated. Wife at bedside , Patient took 324mg  of ASA prior  To ems arrival

## 2020-10-23 NOTE — Consult Note (Signed)
Cardiology Consultation:   Patient ID: KO BARDON; [id number]; [birth year]   Admit date: 10/23/2020 Date of Consult: 10/23/2020  Primary Care Provider: Hoyt Koch, MD Primary Cardiologist: Mertie Moores, MD 03/09/2020 J.  Glenford Peers, NP, 10/20/2020 Primary Electrophysiologist:  Cristopher Peru, MD 12/12/2019   Patient Profile:   [name] is a 74 y.o. male with a hx of CAD s/p CABG 1991, patent grafts 2017, ICM with an LVEF at 20 to 25% with ICD placement, chronic systolic CHF, history of ventricular tachycardia with MDT ICD placement, hypertension and HLD  who is being seen today for the evaluation of ICD discharge at the request of Dr Jeanell Sparrow.  History of Present Illness:   Mr. Cokley saw Ms. McDaniel on 11/23.  He was having problems with dyspnea on exertion.  He also had an upper respiratory infection with a cough productive of white sputum and some low-grade temperatures.  He did not have any chest pain.  However, his dyspnea on exertion was very concerning and he did not have any recent testing.  An echocardiogram and a Lexiscan Myoview were ordered.  These tests have been scheduled but have not happened yet.  He did his scheduled interrogation of his Medtronic ICD on 11/18 and had some nonsustained VT, but nothing requiring treatment.  Today, he got up out of bed and felt very short of breath.  He started walking to the bathroom and was dizzy and lightheaded.  He felt extremely weak.  He felt the defibrillator fire and then shortly after that felt a little better.  A little while later, he was going to get dressed, and it happened again.  Both episodes had a short prodrome of being extremely weak and dizzy, feeling very lightheaded.  He did not have palpitations or chest pain.  After the second shock, they came to the emergency room.  In the emergency room, he is resting comfortably.  He is having frequent PVCs on the monitor and some short runs of nonsustained  VT, mostly salvos.  He is completely unaware of the arrhythmia.   Past Medical History:  Diagnosis Date  . Adult ADHD   . AICD (automatic cardioverter/defibrillator) present   . CAD (coronary artery disease)    Cath 2008, all grafts patent // Cath 2017 - all grafts patent   . 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 Carotid artery disease ([place])    Doppler, February, 2012, mild plaque, 0-39% bilateral, plan followup in one year  . Dyspepsia   . Ejection fraction < 50%   . Elevated PSA 02/15/2011  . Esophageal stricture   . GERD (gastroesophageal reflux disease)   . HTN (hypertension)   . Hx of CABG    1991  . Hyperlipidemia   . 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 Myocardial infarction ([place]) 1998  . Prostate cancer ([place]) 2012   IMRT, ADT  . Thrombosis of arm    Left  . Venous thrombosis    Left arm    Past Surgical History:  Procedure Laterality Date  . CARDIAC CATHETERIZATION  12/05/06  . CARDIAC CATHETERIZATION N/A 12/21/2015   Procedure: Left Heart Cath and Coronary Angiography;  Surgeon: Lorretta Harp, MD;  Location: [facility];  Service: Cardiovascular;  Laterality: N/A;  .  CORONARY ARTERY BYPASS GRAFT     LIMA_LAD, SVG-OM, Cx, RCA '91  . CYSTECTOMY     Hand  . 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 ICD Placement    . IMPLANTABLE CARDIOVERTER DEFIBRILLATOR (ICD) GENERATOR CHANGE  04/06/2015   Procedure: Icd Generator Change;  Surgeon: Evans Lance, MD;  Location: [facility];  Service: Cardiovascular;;  . menistectomies, left knee  '09  . ROTATOR CUFF REPAIR  2005, 2007  . Thumb Surgery    . TONSILLECTOMY  2005   Malignant lesion, w/XRT     Prior to Admission medications   Medication Sig Start  Date End Date Taking? Authorizing Provider  aspirin 81 MG EC tablet Take 81 mg by mouth daily.      [provider]  atorvastatin (LIPITOR) 80 MG tablet TAKE 1 TABLET(80 MG) BY MOUTH DAILY AT 6 PM 03/02/20   Nahser, Wonda Cheng, MD  buPROPion (WELLBUTRIN XL) 300 MG 24 hr tablet Take 300 mg by mouth daily.    [provider]  busPIRone (BUSPAR) 30 MG tablet Take 30 mg by mouth daily.     [provider]  carvedilol (COREG) 12.5 MG tablet TAKE 1 TABLET(12.5 MG) BY MOUTH TWICE DAILY 03/11/20   Nahser, Wonda Cheng, MD  EPINEPHrine 0.3 mg/0.3 mL IJ SOAJ injection Inject 0.3 mLs (0.3 mg total) into the muscle as needed (anaphylaxis). 06/17/18   Deno Etienne, DO  escitalopram (LEXAPRO) 20 MG tablet Take 20 mg by mouth daily.      [provider]  esomeprazole (NEXIUM) 20 MG packet Take 20 mg by mouth daily before breakfast.    [provider]  losartan (COZAAR) 50 MG tablet TAKE 1 TABLET BY MOUTH DAILY 05/28/20   Nahser, Wonda Cheng, MD  Multiple Vitamins-Minerals (OCUVITE PRESERVISION) TABS Take 1 tablet by mouth daily.     [provider]  nitroGLYCERIN (NITROSTAT) 0.4 MG SL tablet Place 1 tablet (0.4 mg total) under the tongue every 5 (five) minutes as needed for chest pain. 12/23/15   Bhagat, Crista Luria, PA  zolpidem (AMBIEN) 10 MG tablet Take 1 tablet by mouth at bedtime as needed for sleep.  02/28/14   [provider]    Inpatient Medications: Scheduled Meds:  Continuous Infusions: . amiodarone 60 mg/hr (10/23/20 1213)   Followed by  . amiodarone     PRN Meds:   Allergies:    Allergies  Allergen Reactions  . Other Anaphylaxis    Insect bites (Has EpiPen)    Social History:   Social History   Socioeconomic History  . Marital status: Married    Spouse name: Not on file  . Number of children: 0  . Years of education: 54  . Highest education level: Not on file  Occupational History  . Occupation: Retired    Fish farm manager: RETIRED     CommentOceanographer for [employer]  Tobacco Use  . Smoking status: Never Smoker  . Smokeless tobacco: Never Used  Vaping Use  . Vaping Use: Never used  Substance and Sexual Activity  . Alcohol use: No  . Drug use: No  . Sexual activity: Yes    Birth control/protection: None  Other Topics Concern  . Not on file  Social History Narrative   [employer], [employer]. 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 Marriage in good health, wife in good health. ACP discussed Jan '15: no CPR, no mechanical ventilation.  He is referred to [email or web address] and provided with packet with HCPOA-his wife./ Advanced directive.   Fun/Hobby: Yard work, Architect working.    Social Determinants of Health   Financial Resource Strain:   . Difficulty of Paying Living Expenses: Not on file  Food Insecurity:   . Worried About Charity fundraiser in the Last Year: Not on file  . Ran Out of Food in the Last Year: Not on file  Transportation Needs:   . Lack of Transportation (Medical): Not on file  . Lack of Transportation (Non-Medical): Not on file  Physical Activity:   . Days of Exercise per Week: Not on file  . Minutes of Exercise per Session: Not on file  Stress:   . Feeling of Stress : Not on file  Social Connections:   . Frequency of Communication with Friends and Family: Not on file  . Frequency of Social Gatherings with Friends and Family: Not on file  . Attends Religious Services: Not on file  . Active Member of Clubs or Organizations: Not on file  . Attends Archivist Meetings: Not on file  . Marital Status: Not on file  Intimate Partner Violence:   . Fear of Current or Ex-Partner: Not on file  . Emotionally Abused: Not on file  . Physically Abused: Not on file  . Sexually Abused: Not on file    Family History:   Family History  Problem Relation Age of Onset  . Alzheimer's disease Mother   . Dementia Mother        living in [place]  . Hyperlipidemia Father   . Hypertension Father     . CAD Father        had pacemaker  . Congestive Heart Failure Father   . Heart disease Brother        Bypass surgery  . Breast cancer Sister        survivor  . Colon cancer Neg Hx   . Prostate cancer Neg Hx   . Diabetes Neg Hx    Family Status:  Family Status  Relation Name Status  . Mother [facility]       Physically stable  . Father  Deceased at age 26       CAD/CHF  . Brother  Alive  . Sister  Alive  . Neg Hx  (Not Specified)    ROS:  Please see the history of present illness.  All other ROS reviewed and negative.     Physical Exam/Data:   Vitals:   10/23/20 1100 10/23/20 1101 10/23/20 1200  BP: (!) 145/90  138/85  Pulse: 76  65  Resp: 20  17  Temp: 98.8 F (37.1 C)    TempSrc: Oral    SpO2: 97%  94%  Weight:  83 kg   Height:  6' (1.829 m)    No intake or output data in the 24 hours ending 10/23/20 1309  Last 3 Weights 10/23/2020 10/20/2020 03/09/2020  Weight (lbs) 183 lb 183 lb 182 lb 3.2 oz  Weight (kg) 83.008 kg 83.008 kg 82.645 kg     Body mass index is 24.82 kg/m.   General:  Well nourished, well developed, male in no acute distress HEENT: normal Lymph: no adenopathy Neck: JVD -not seen elevated Endocrine:  No thryomegaly Vascular: No carotid bruits; 4/4 extremity pulses 2+  Cardiac:  normal S1, S2; RRR; 2/6 murmur Lungs:  clear bilaterally, no wheezing, rhonchi or rales  Abd: soft, nontender, no hepatomegaly  Ext:  no edema Musculoskeletal:  No deformities, BUE and BLE strength normal and equal Skin: warm and dry  Neuro:  CNs 2-12 intact, no focal abnormalities noted Psych:  Normal affect   EKG:  The EKG was personally reviewed and demonstrates: Sinus rhythm, heart rate 79, left bundle branch block with QRS duration 168 ms, frequent PVCs Telemetry:  Telemetry was personally reviewed and demonstrates: Sinus rhythm with multifocal frequent PVCs, salvos   CV studies:   ECHO: 08/18/2015 - Left ventricle: The cavity size was normal. Wall  thickness was  normal. Systolic function was severely reduced. The estimated  ejection fraction was in the range of 20% to 25%. Diffuse  hypokinesis. Akinesis of the apicalapical myocardium. Doppler  parameters are consistent with abnormal left ventricular  relaxation (grade 1 diastolic dysfunction).  - Aortic valve: There was mild regurgitation.  - Left atrium: The atrium was severely dilated.  - Right atrium: The atrium was mildly to moderately dilated.   CATH: 12/21/2015 Diagnostic Dominance: Right     Laboratory Data:   Chemistry Recent Labs  Lab 10/20/20 0921 10/23/20 1044 10/23/20 1126  NA 139 140 144  K 3.8 3.5 3.5  CL 103 108 106  CO2 22 22  --   GLUCOSE 97 114* 110*  BUN 22 16 20   CREATININE 1.12 1.27* 1.10  CALCIUM 8.9 8.4*  --   GFRNONAA 65 >60  --   GFRAA 75  --   --   ANIONGAP  --  10  --     Lab Results  Component Value Date   ALT 9 03/09/2020   AST 13 03/09/2020   ALKPHOS 105 03/09/2020   BILITOT 0.6 03/09/2020   Hematology Recent Labs  Lab 10/20/20 0921 10/23/20 1044 10/23/20 1126  WBC 7.4 6.6  --   RBC 4.30 3.94*  --   HGB 12.3* 11.7* 12.6*  HCT 37.1* 36.5* 37.0*  MCV 86 92.6  --   MCH 28.6 29.7  --   MCHC 33.2 32.1  --   RDW 13.8 14.4  --   PLT 250 226  --    Cardiac Enzymes High Sensitivity Troponin:   Recent Labs  Lab 10/23/20 1044  TROPONINIHS 55*      BNPNo results for input(s): BNP, PROBNP in the last 168 hours.  DDimer No results for input(s): DDIMER in the last 168 hours. TSH:  Lab Results  Component Value Date   TSH 1.77 01/08/2013   Lipids: Lab Results  Component Value Date   CHOL 152 03/09/2020   HDL 61 03/09/2020   LDLCALC 75 03/09/2020   TRIG 88 03/09/2020   CHOLHDL 2.5 03/09/2020   HgbA1c: Lab Results  Component Value Date   HGBA1C 5.7 01/05/2016   Magnesium: No results found for: MG   Radiology/Studies:  DG Chest 2 View  Result Date: 10/20/2020 CLINICAL DATA:  74 year old male  with a history of shortness of breath EXAM: CHEST - 2 VIEW COMPARISON:  05/04/2010 FINDINGS: Cardiomediastinal silhouette unchanged. Surgical changes of median sternotomy and CABG. Unchanged cardiac pacing device/AICD from the left subclavian approach. Interval replacement of the generator. No pneumothorax. No confluent airspace disease. Stigmata of emphysema, with increased retrosternal airspace, flattened hemidiaphragms, increased AP diameter, and hyperinflation on the AP view. Blunting of the costophrenic angles with meniscus on the lateral view. No interlobular septal thickening. No displaced fracture.  Degenerative changes of the spine. IMPRESSION: Emphysema with blunting at the costophrenic angles, potentially scarring or trace pleural effusion. No evidence of overt edema  or lobar pneumonia. Surgical changes of median sternotomy and CABG, as well as AICD. Electronically Signed   By: Corrie Mckusick D.O.   On: 10/20/2020 15:46   DG Chest Portable 1 View  Result Date: 10/23/2020 CLINICAL DATA:  AICD. EXAM: PORTABLE CHEST 1 VIEW COMPARISON:  CT 12/21/2015.  Chest x-ray 05/04/2010. FINDINGS: AICD in stable position. Prior CABG. Stable cardiomegaly. No focal infiltrate. Mild left base subsegmental atelectasis. No pleural effusion or pneumothorax. Right costophrenic angle incompletely imaged. Degenerative change thoracic spine. IMPRESSION: 1. AICD in stable position. Prior CABG. Stable cardiomegaly. 2. Mild left base subsegmental atelectasis. Electronically Signed   By: [place]   On: 10/23/2020 11:22    Assessment and Plan:   1.  ICD discharges -EP to see however need to rule out an ischemic cause of the VT - he has been started on amiodarone -ER did a device interrogation, report is in paper chart.  Shows 8 episodes of VT, 2 shocks  2. DOE, CAD: -He is not very active, on his last interrogation, his activity level was 2.3 hours a day. -He has not been having chest pain -He was dealing with  some sort of upper respiratory infection, but did not require antibiotics. -Those symptoms had largely resolved when the VT happened. -Because of his dyspnea on exertion, a Myoview was planned -However, with his acute episodes of VT, will need cath - Cardiac catheterization was discussed with the patient fully. The patient understands that risks include but are not limited to stroke (1 in 1000), death (1 in 79), kidney failure [usually temporary] (1 in 500), bleeding (1 in 200), allergic reaction [possibly serious] (1 in 200).  The patient understands and is willing to proceed.   -His creatinine is a bit above normal, will hydrate  3.  Cough and cold symptoms -Chest x-rays done on 11/23 at his cardiology office visit and today do not show pneumonia or heart failure -Covid test has been done, results pending.   -Currently is afebrile, not coughing  4. HTN -Blood pressure somewhat elevated today, continue home meds and follow to assess for med changes needed  Principal Problem:   VT (ventricular tachycardia) ([place]) Active Problems:   Essential hypertension   DOE (dyspnea on exertion)   URI (upper respiratory infection)     For questions or updates, please contact [place] Please consult [email or web address] for contact info under Cardiology/STEMI.   Jonetta Speak, PA-C  10/23/2020 1:09 PM

## 2020-10-23 NOTE — Plan of Care (Signed)

## 2020-10-23 NOTE — H&P (View-Only) (Signed)
Cardiology Consultation:   Patient ID: [name]; [id number]; 1946-02-07   Admit date: 10/23/2020 Date of Consult: 10/23/2020  Primary Care Provider: Hoyt Koch, MD Primary Cardiologist: Mertie Moores, MD 03/09/2020 J.  Glenford Peers, NP, 10/20/2020 Primary Electrophysiologist:  Cristopher Peru, MD 12/12/2019   Patient Profile:   [name] is a 74 y.o. male with a hx of CAD s/p CABG 1991, patent grafts 2017, ICM with an LVEF at 20 to 25% with ICD placement, chronic systolic CHF, history of ventricular tachycardia with MDT ICD placement, hypertension and HLD  who is being seen today for the evaluation of ICD discharge at the request of Dr Jeanell Sparrow.  History of Present Illness:   Mr. Herzberg saw Ms. McDaniel on 11/23.  He was having problems with dyspnea on exertion.  He also had an upper respiratory infection with a cough productive of white sputum and some low-grade temperatures.  He did not have any chest pain.  However, his dyspnea on exertion was very concerning and he did not have any recent testing.  An echocardiogram and a Lexiscan Myoview were ordered.  These tests have been scheduled but have not happened yet.  He did his scheduled interrogation of his Medtronic ICD on 11/18 and had some nonsustained VT, but nothing requiring treatment.  Today, he got up out of bed and felt very short of breath.  He started walking to the bathroom and was dizzy and lightheaded.  He felt extremely weak.  He felt the defibrillator fire and then shortly after that felt a little better.  A little while later, he was going to get dressed, and it happened again.  Both episodes had a short prodrome of being extremely weak and dizzy, feeling very lightheaded.  He did not have palpitations or chest pain.  After the second shock, they came to the emergency room.  In the emergency room, he is resting comfortably.  He is having frequent PVCs on the monitor and some short runs of nonsustained  VT, mostly salvos.  He is completely unaware of the arrhythmia.   Past Medical History:  Diagnosis Date  . Adult ADHD   . AICD (automatic cardioverter/defibrillator) present   . CAD (coronary artery disease)    Cath 2008, all grafts patent // Cath 2017 - all grafts patent   . 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 Carotid artery disease ([place])    Doppler, February, 2012, mild plaque, 0-39% bilateral, plan followup in one year  . Dyspepsia   . Ejection fraction < 50%   . Elevated PSA 02/15/2011  . Esophageal stricture   . GERD (gastroesophageal reflux disease)   . HTN (hypertension)   . Hx of CABG    1991  . Hyperlipidemia   . 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 Myocardial infarction ([place]) 1998  . Prostate cancer ([place]) 2012   IMRT, ADT  . Thrombosis of arm    Left  . Venous thrombosis    Left arm    Past Surgical History:  Procedure Laterality Date  . CARDIAC CATHETERIZATION  12/05/06  . CARDIAC CATHETERIZATION N/A 12/21/2015   Procedure: Left Heart Cath and Coronary Angiography;  Surgeon: Lorretta Harp, MD;  Location: [facility];  Service: Cardiovascular;  Laterality: N/A;  .  CORONARY ARTERY BYPASS GRAFT     LIMA_LAD, SVG-OM, Cx, RCA '91  . CYSTECTOMY     Hand  . 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 ICD Placement    . IMPLANTABLE CARDIOVERTER DEFIBRILLATOR (ICD) GENERATOR CHANGE  04/06/2015   Procedure: Icd Generator Change;  Surgeon: Evans Lance, MD;  Location: [facility];  Service: Cardiovascular;;  . menistectomies, left knee  '09  . ROTATOR CUFF REPAIR  2005, 2007  . Thumb Surgery    . TONSILLECTOMY  2005   Malignant lesion, w/XRT     Prior to Admission medications   Medication Sig Start  Date End Date Taking? Authorizing Provider  aspirin 81 MG EC tablet Take 81 mg by mouth daily.      [provider]  atorvastatin (LIPITOR) 80 MG tablet TAKE 1 TABLET(80 MG) BY MOUTH DAILY AT 6 PM 03/02/20   Nahser, Wonda Cheng, MD  buPROPion (WELLBUTRIN XL) 300 MG 24 hr tablet Take 300 mg by mouth daily.    [provider]  busPIRone (BUSPAR) 30 MG tablet Take 30 mg by mouth daily.     [provider]  carvedilol (COREG) 12.5 MG tablet TAKE 1 TABLET(12.5 MG) BY MOUTH TWICE DAILY 03/11/20   Nahser, Wonda Cheng, MD  EPINEPHrine 0.3 mg/0.3 mL IJ SOAJ injection Inject 0.3 mLs (0.3 mg total) into the muscle as needed (anaphylaxis). 06/17/18   Deno Etienne, DO  escitalopram (LEXAPRO) 20 MG tablet Take 20 mg by mouth daily.      [provider]  esomeprazole (NEXIUM) 20 MG packet Take 20 mg by mouth daily before breakfast.    [provider]  losartan (COZAAR) 50 MG tablet TAKE 1 TABLET BY MOUTH DAILY 05/28/20   Nahser, Wonda Cheng, MD  Multiple Vitamins-Minerals (OCUVITE PRESERVISION) TABS Take 1 tablet by mouth daily.     [provider]  nitroGLYCERIN (NITROSTAT) 0.4 MG SL tablet Place 1 tablet (0.4 mg total) under the tongue every 5 (five) minutes as needed for chest pain. 12/23/15   Bhagat, Crista Luria, PA  zolpidem (AMBIEN) 10 MG tablet Take 1 tablet by mouth at bedtime as needed for sleep.  02/28/14   [provider]    Inpatient Medications: Scheduled Meds:  Continuous Infusions: . amiodarone 60 mg/hr (10/23/20 1213)   Followed by  . amiodarone     PRN Meds:   Allergies:    Allergies  Allergen Reactions  . Other Anaphylaxis    Insect bites (Has EpiPen)    Social History:   Social History   Socioeconomic History  . Marital status: Married    Spouse name: Not on file  . Number of children: 0  . Years of education: 4  . Highest education level: Not on file  Occupational History  . Occupation: Retired    Fish farm manager: RETIRED     CommentOceanographer for [employer]  Tobacco Use  . Smoking status: Never Smoker  . Smokeless tobacco: Never Used  Vaping Use  . Vaping Use: Never used  Substance and Sexual Activity  . Alcohol use: No  . Drug use: No  . Sexual activity: Yes    Birth control/protection: None  Other Topics Concern  . Not on file  Social History Narrative   [employer], [employer]. 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 Marriage in good health, wife in good health. ACP discussed Jan '15: no CPR, no mechanical ventilation.  He is referred to [email or web address] and provided with packet with HCPOA-his wife./ Advanced directive.   Fun/Hobby: Yard work, Architect working.    Social Determinants of Health   Financial Resource Strain:   . Difficulty of Paying Living Expenses: Not on file  Food Insecurity:   . Worried About Charity fundraiser in the Last Year: Not on file  . Ran Out of Food in the Last Year: Not on file  Transportation Needs:   . Lack of Transportation (Medical): Not on file  . Lack of Transportation (Non-Medical): Not on file  Physical Activity:   . Days of Exercise per Week: Not on file  . Minutes of Exercise per Session: Not on file  Stress:   . Feeling of Stress : Not on file  Social Connections:   . Frequency of Communication with Friends and Family: Not on file  . Frequency of Social Gatherings with Friends and Family: Not on file  . Attends Religious Services: Not on file  . Active Member of Clubs or Organizations: Not on file  . Attends Archivist Meetings: Not on file  . Marital Status: Not on file  Intimate Partner Violence:   . Fear of Current or Ex-Partner: Not on file  . Emotionally Abused: Not on file  . Physically Abused: Not on file  . Sexually Abused: Not on file    Family History:   Family History  Problem Relation Age of Onset  . Alzheimer's disease Mother   . Dementia Mother        living in [place]  . Hyperlipidemia Father   . Hypertension Father     . CAD Father        had pacemaker  . Congestive Heart Failure Father   . Heart disease Brother        Bypass surgery  . Breast cancer Sister        survivor  . Colon cancer Neg Hx   . Prostate cancer Neg Hx   . Diabetes Neg Hx    Family Status:  Family Status  Relation Name Status  . Mother [facility]       Physically stable  . Father  Deceased at age 20       CAD/CHF  . Brother  Alive  . Sister  Alive  . Neg Hx  (Not Specified)    ROS:  Please see the history of present illness.  All other ROS reviewed and negative.     Physical Exam/Data:   Vitals:   10/23/20 1100 10/23/20 1101 10/23/20 1200  BP: (!) 145/90  138/85  Pulse: 76  65  Resp: 20  17  Temp: 98.8 F (37.1 C)    TempSrc: Oral    SpO2: 97%  94%  Weight:  83 kg   Height:  6' (1.829 m)    No intake or output data in the 24 hours ending 10/23/20 1309  Last 3 Weights 10/23/2020 10/20/2020 03/09/2020  Weight (lbs) 183 lb 183 lb 182 lb 3.2 oz  Weight (kg) 83.008 kg 83.008 kg 82.645 kg     Body mass index is 24.82 kg/m.   General:  Well nourished, well developed, male in no acute distress HEENT: normal Lymph: no adenopathy Neck: JVD -not seen elevated Endocrine:  No thryomegaly Vascular: No carotid bruits; 4/4 extremity pulses 2+  Cardiac:  normal S1, S2; RRR; 2/6 murmur Lungs:  clear bilaterally, no wheezing, rhonchi or rales  Abd: soft, nontender, no hepatomegaly  Ext:  no edema Musculoskeletal:  No deformities, BUE and BLE strength normal and equal Skin: warm and dry  Neuro:  CNs 2-12 intact, no focal abnormalities noted Psych:  Normal affect   EKG:  The EKG was personally reviewed and demonstrates: Sinus rhythm, heart rate 79, left bundle branch block with QRS duration 168 ms, frequent PVCs Telemetry:  Telemetry was personally reviewed and demonstrates: Sinus rhythm with multifocal frequent PVCs, salvos   CV studies:   ECHO: 08/18/2015 - Left ventricle: The cavity size was normal. Wall  thickness was  normal. Systolic function was severely reduced. The estimated  ejection fraction was in the range of 20% to 25%. Diffuse  hypokinesis. Akinesis of the apicalapical myocardium. Doppler  parameters are consistent with abnormal left ventricular  relaxation (grade 1 diastolic dysfunction).  - Aortic valve: There was mild regurgitation.  - Left atrium: The atrium was severely dilated.  - Right atrium: The atrium was mildly to moderately dilated.   CATH: 12/21/2015 Diagnostic Dominance: Right     Laboratory Data:   Chemistry Recent Labs  Lab 10/20/20 0921 10/23/20 1044 10/23/20 1126  NA 139 140 144  K 3.8 3.5 3.5  CL 103 108 106  CO2 22 22  --   GLUCOSE 97 114* 110*  BUN 22 16 20   CREATININE 1.12 1.27* 1.10  CALCIUM 8.9 8.4*  --   GFRNONAA 65 >60  --   GFRAA 75  --   --   ANIONGAP  --  10  --     Lab Results  Component Value Date   ALT 9 03/09/2020   AST 13 03/09/2020   ALKPHOS 105 03/09/2020   BILITOT 0.6 03/09/2020   Hematology Recent Labs  Lab 10/20/20 0921 10/23/20 1044 10/23/20 1126  WBC 7.4 6.6  --   RBC 4.30 3.94*  --   HGB 12.3* 11.7* 12.6*  HCT 37.1* 36.5* 37.0*  MCV 86 92.6  --   MCH 28.6 29.7  --   MCHC 33.2 32.1  --   RDW 13.8 14.4  --   PLT 250 226  --    Cardiac Enzymes High Sensitivity Troponin:   Recent Labs  Lab 10/23/20 1044  TROPONINIHS 55*      BNPNo results for input(s): BNP, PROBNP in the last 168 hours.  DDimer No results for input(s): DDIMER in the last 168 hours. TSH:  Lab Results  Component Value Date   TSH 1.77 01/08/2013   Lipids: Lab Results  Component Value Date   CHOL 152 03/09/2020   HDL 61 03/09/2020   LDLCALC 75 03/09/2020   TRIG 88 03/09/2020   CHOLHDL 2.5 03/09/2020   HgbA1c: Lab Results  Component Value Date   HGBA1C 5.7 01/05/2016   Magnesium: No results found for: MG   Radiology/Studies:  DG Chest 2 View  Result Date: 10/20/2020 CLINICAL DATA:  74 year old male  with a history of shortness of breath EXAM: CHEST - 2 VIEW COMPARISON:  05/04/2010 FINDINGS: Cardiomediastinal silhouette unchanged. Surgical changes of median sternotomy and CABG. Unchanged cardiac pacing device/AICD from the left subclavian approach. Interval replacement of the generator. No pneumothorax. No confluent airspace disease. Stigmata of emphysema, with increased retrosternal airspace, flattened hemidiaphragms, increased AP diameter, and hyperinflation on the AP view. Blunting of the costophrenic angles with meniscus on the lateral view. No interlobular septal thickening. No displaced fracture.  Degenerative changes of the spine. IMPRESSION: Emphysema with blunting at the costophrenic angles, potentially scarring or trace pleural effusion. No evidence of overt edema  or lobar pneumonia. Surgical changes of median sternotomy and CABG, as well as AICD. Electronically Signed   By: Corrie Mckusick D.O.   On: 10/20/2020 15:46   DG Chest Portable 1 View  Result Date: 10/23/2020 CLINICAL DATA:  AICD. EXAM: PORTABLE CHEST 1 VIEW COMPARISON:  CT 12/21/2015.  Chest x-ray 05/04/2010. FINDINGS: AICD in stable position. Prior CABG. Stable cardiomegaly. No focal infiltrate. Mild left base subsegmental atelectasis. No pleural effusion or pneumothorax. Right costophrenic angle incompletely imaged. Degenerative change thoracic spine. IMPRESSION: 1. AICD in stable position. Prior CABG. Stable cardiomegaly. 2. Mild left base subsegmental atelectasis. Electronically Signed   By: [place] [facility]   On: 10/23/2020 11:22    Assessment and Plan:   1.  ICD discharges -EP to see however need to rule out an ischemic cause of the VT - he has been started on amiodarone -ER did a device interrogation, report is in paper chart.  Shows 8 episodes of VT, 2 shocks  2. DOE, CAD: -He is not very active, on his last interrogation, his activity level was 2.3 hours a day. -He has not been having chest pain -He was dealing with  some sort of upper respiratory infection, but did not require antibiotics. -Those symptoms had largely resolved when the VT happened. -Because of his dyspnea on exertion, a Myoview was planned -However, with his acute episodes of VT, will need cath - Cardiac catheterization was discussed with the patient fully. The patient understands that risks include but are not limited to stroke (1 in 1000), death (1 in 59), kidney failure [usually temporary] (1 in 500), bleeding (1 in 200), allergic reaction [possibly serious] (1 in 200).  The patient understands and is willing to proceed.   -His creatinine is a bit above normal, will hydrate  3.  Cough and cold symptoms -Chest x-rays done on 11/23 at his cardiology office visit and today do not show pneumonia or heart failure -Covid test has been done, results pending.   -Currently is afebrile, not coughing  4. HTN -Blood pressure somewhat elevated today, continue home meds and follow to assess for med changes needed  Principal Problem:   VT (ventricular tachycardia) ([place]) Active Problems:   Essential hypertension   DOE (dyspnea on exertion)   URI (upper respiratory infection)     For questions or updates, please contact [place] Please consult [email or web address] for contact info under Cardiology/STEMI.   Jonetta Speak, PA-C  10/23/2020 1:09 PM

## 2020-10-23 NOTE — Interval H&P Note (Signed)
Cath Lab Visit (complete for each Cath Lab visit)  Clinical Evaluation Leading to the Procedure:   ACS: Yes.    Non-ACS:    Anginal Classification: CCS IV  Anti-ischemic medical therapy: Minimal Therapy (1 class of medications)  Non-Invasive Test Results: Low-risk stress test findings: cardiac mortality <1%/year  Prior CABG: Previous CABG      History and Physical Interval Note:  10/23/2020 1:55 PM  [name]  has presented today for surgery, with the diagnosis of Dyspnea on exertion.  The various methods of treatment have been discussed with the patient and family. After consideration of risks, benefits and other options for treatment, the patient has consented to  Procedure(s): LEFT HEART CATH AND CORS/GRAFTS ANGIOGRAPHY (N/A) as a surgical intervention.  The patient's history has been reviewed, patient examined, no change in status, stable for surgery.  I have reviewed the patient's chart and labs.  Questions were answered to the patient's satisfaction.     Larae Grooms

## 2020-10-23 NOTE — ED Provider Notes (Signed)
[facility] EMERGENCY DEPARTMENT Provider Note   CSN: [id number] Arrival date & time: 10/23/20  1027     History Chief Complaint  Patient presents with  . AICD Problem    [name] is a 74 y.o. male.  HPI 74 year old male history of coronary artery disease, status post cardioverter/defibrillator placement, presents today with lightheadedness, near syncope and feeling like his defibrillator fired. He reports that he has had some URI symptoms over the past 10 days. When the symptoms began he was seen in the cardiology office and had a Covid test obtained that he reports is negative. He has completed his Covid vaccinations several months ago. He has no known exposures. He is continue to have some cough and low-grade fevers. He does continue to have nasal congestion. He reports no prior episodes of his defibrillator firing. His defibrillator was interrogated here. From nursing report, she was called and told by the company that his defibrillator had fired twice and there have been eight detected episodes of V. tach. Patient reports no recent changes in his medication and reports taking medications as prescribed.    Past Medical History:  Diagnosis Date  . Adult ADHD   . AICD (automatic cardioverter/defibrillator) present   . CAD (coronary artery disease)    Cath 2008, all grafts patent // Cath 2017 - all grafts patent   . 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 Carotid artery disease ([place])    Doppler, February, 2012, mild plaque, 0-39% bilateral, plan followup in one year  . Dyspepsia   . Ejection fraction < 50%   . Elevated PSA 02/15/2011  . Esophageal stricture   . GERD (gastroesophageal reflux disease)   . HTN (hypertension)   . Hx of CABG    1991  . Hyperlipidemia   . ICD (implantable cardiac defibrillator) battery depletion    Placed 2008  (eepisodes  nonsustained monomorphic VT  in the past)  . 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 Myocardial infarction ([place]) 1998  . Prostate cancer ([place]) 2012   IMRT, ADT  . Thrombosis of arm    Left  . Venous thrombosis    Left arm    Patient Active Problem List   Diagnosis Date Noted  . Medicare annual wellness visit, subsequent 05/19/2017  . Allergic rhinitis 05/05/2017  . Orthostatic hypotension 05/11/2016  . Elevated LFTs 03/24/2016  . Impaired fasting blood sugar 01/10/2016  . Chronic systolic CHF (congestive heart failure) ([place]) 12/30/2015  . Epigastric pain 12/21/2015  . Atypical chest pain 12/21/2015  . ST elevation myocardial infarction (STEMI) ([place])   . Hx of CABG   . Adult ADHD   . Ejection fraction < 50%   . ICD (implantable cardioverter-defibrillator) in place 03/10/2015  . Ventricular tachycardia ([place]) 03/05/2014  . Routine health maintenance 12/11/2013  . Advanced care planning/counseling discussion 12/10/2013  . Cardiomyopathy, ischemic   . CAD (coronary artery disease)   . Essential hypertension   . Hyperlipidemia   . Encounter for servicing of automatic implantable cardioverter-defibrillator (AICD) at end of battery life   . Carotid artery disease ([place])   . Prostate cancer ([place]) 11/09/2011  . 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 ESOPHAGEAL STRICTURE 08/23/2008    Past Surgical History:  Procedure Laterality Date  . CARDIAC CATHETERIZATION  12/05/06  . CARDIAC CATHETERIZATION N/A 12/21/2015   Procedure: Left Heart Cath and Coronary Angiography;  Surgeon:  Lorretta Harp, MD;  Location: [facility];  Service: Cardiovascular;  Laterality: N/A;  . CORONARY ARTERY BYPASS GRAFT     LIMA_LAD, SVG-OM, Cx, RCA '91  . CYSTECTOMY     Hand  . 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 ICD Placement    . IMPLANTABLE CARDIOVERTER DEFIBRILLATOR (ICD) GENERATOR CHANGE  04/06/2015   Procedure: Icd Generator Change;  Surgeon: Evans Lance, MD;   Location: [facility];  Service: Cardiovascular;;  . menistectomies, left knee  '09  . ROTATOR CUFF REPAIR  2005, 2007  . Thumb Surgery    . TONSILLECTOMY  2005   Malignant lesion, w/XRT       Family History  Problem Relation Age of Onset  . Alzheimer's disease Mother   . Dementia Mother        living in [place]  . Hyperlipidemia Father   . Hypertension Father   . CAD Father        had pacemaker  . Congestive Heart Failure Father   . Heart disease Brother        Bypass surgery  . Breast cancer Sister        survivor  . Colon cancer Neg Hx   . Prostate cancer Neg Hx   . Diabetes Neg Hx     Social History   Tobacco Use  . Smoking status: Never Smoker  . Smokeless tobacco: Never Used  Vaping Use  . Vaping Use: Never used  Substance Use Topics  . Alcohol use: No  . Drug use: No    Home Medications Prior to Admission medications   Medication Sig Start Date End Date Taking? Authorizing Provider  aspirin 81 MG EC tablet Take 81 mg by mouth daily.      [provider]  atorvastatin (LIPITOR) 80 MG tablet TAKE 1 TABLET(80 MG) BY MOUTH DAILY AT 6 PM 03/02/20   Nahser, Wonda Cheng, MD  buPROPion (WELLBUTRIN XL) 300 MG 24 hr tablet Take 300 mg by mouth daily.    [provider]  busPIRone (BUSPAR) 30 MG tablet Take 30 mg by mouth daily.     [provider]  carvedilol (COREG) 12.5 MG tablet TAKE 1 TABLET(12.5 MG) BY MOUTH TWICE DAILY 03/11/20   Nahser, Wonda Cheng, MD  EPINEPHrine 0.3 mg/0.3 mL IJ SOAJ injection Inject 0.3 mLs (0.3 mg total) into the muscle as needed (anaphylaxis). 06/17/18   Deno Etienne, DO  escitalopram (LEXAPRO) 20 MG tablet Take 20 mg by mouth daily.      [provider]  esomeprazole (NEXIUM) 20 MG packet Take 20 mg by mouth daily before breakfast.    [provider]  losartan (COZAAR) 50 MG tablet TAKE 1 TABLET BY MOUTH DAILY 05/28/20   Nahser, Wonda Cheng, MD  Multiple Vitamins-Minerals (OCUVITE PRESERVISION) TABS Take 1  tablet by mouth daily.     [provider]  nitroGLYCERIN (NITROSTAT) 0.4 MG SL tablet Place 1 tablet (0.4 mg total) under the tongue every 5 (five) minutes as needed for chest pain. 12/23/15   Bhagat, Crista Luria, PA  zolpidem (AMBIEN) 10 MG tablet Take 1 tablet by mouth at bedtime as needed for sleep.  02/28/14   [provider]    Allergies    Other  Review of Systems   Review of Systems  All other systems reviewed and are negative.   Physical Exam Updated Vital Signs BP (!) 145/90 (BP  Location: Right Arm)   Pulse 76   Temp 98.8 F (37.1 C) (Oral)   Resp 20   Ht 1.829 m (6')   Wt 83 kg   SpO2 97%   BMI 24.82 kg/m   Physical Exam Vitals and nursing note reviewed.  Constitutional:      General: He is not in acute distress.    Appearance: Normal appearance. He is not ill-appearing.  HENT:     Head: Normocephalic.     Right Ear: External ear normal.     Left Ear: External ear normal.     Nose: Nose normal.     Mouth/Throat:     Mouth: Mucous membranes are moist.  Eyes:     Pupils: Pupils are equal, round, and reactive to light.     Comments: Conjunctival appear pale  Cardiovascular:     Rate and Rhythm: Normal rate and regular rhythm.     Pulses: Normal pulses.     Heart sounds: Normal heart sounds.     Comments: Defibrillator present left chest wall with no surrounding area edema, swelling, or tenderness Pulmonary:     Effort: Pulmonary effort is normal.     Breath sounds: Normal breath sounds.  Abdominal:     General: Abdomen is flat. Bowel sounds are normal.  Musculoskeletal:        General: Normal range of motion.     Cervical back: Normal range of motion.  Skin:    General: Skin is warm.     Capillary Refill: Capillary refill takes less than 2 seconds.     Coloration: Skin is pale.  Neurological:     General: No focal deficit present.     Mental Status: He is alert. Mental status is at baseline.  Psychiatric:        Mood and Affect:  Mood normal.        Behavior: Behavior normal.     ED Results / Procedures / Treatments   Labs (all labs ordered are listed, but only abnormal results are displayed) Labs Reviewed  RESP PANEL BY RT-PCR (FLU A&B, COVID) ARPGX2  CBC WITH DIFFERENTIAL/PLATELET  BASIC METABOLIC PANEL  I-STAT CHEM 8, ED  TROPONIN I (HIGH SENSITIVITY)    EKG EKG Interpretation  Date/Time:  Friday October 23 2020 10:37:50 EST Ventricular Rate:  79 PR Interval:    QRS Duration: 168 QT Interval:  451 QTC Calculation: 518 R Axis:   -51 Text Interpretation: Normal sinus rhythm Premature ventricular complexes RBBB and LAFB Probable left ventricular hypertrophy No significant change since last tracing Confirmed by Pattricia Boss [phone]) on 10/23/2020 11:16:17 AM   Radiology DG Chest Portable 1 View  Result Date: 10/23/2020 CLINICAL DATA:  AICD. EXAM: PORTABLE CHEST 1 VIEW COMPARISON:  CT 12/21/2015.  Chest x-Kenaz Olafson 05/04/2010. FINDINGS: AICD in stable position. Prior CABG. Stable cardiomegaly. No focal infiltrate. Mild left base subsegmental atelectasis. No pleural effusion or pneumothorax. Right costophrenic angle incompletely imaged. Degenerative change thoracic spine. IMPRESSION: 1. AICD in stable position. Prior CABG. Stable cardiomegaly. 2. Mild left base subsegmental atelectasis. Electronically Signed   By: Marcello Moores  Register   On: 10/23/2020 11:22    Procedures .Critical Care Performed by: Pattricia Boss, MD Authorized by: Pattricia Boss, MD   Critical care provider statement:    Critical care time (minutes):  65   Critical care end time:  10/23/2020 4:44 PM   Critical care was necessary to treat or prevent imminent or life-threatening deterioration of the following conditions:  Cardiac failure  Critical care was time spent personally by me on the following activities:  Discussions with consultants, evaluation of patient's response to treatment, examination of patient, ordering and performing  treatments and interventions, ordering and review of laboratory studies, ordering and review of radiographic studies, pulse oximetry, re-evaluation of patient's condition, obtaining history from patient or surrogate and review of old charts   (including critical care time)  Medications Ordered in ED Medications - No data to display  ED Course  I have reviewed the triage vital signs and the nursing notes.  Pertinent labs & imaging results that were available during my care of the patient were reviewed by me and considered in my medical decision making (see chart for details).    MDM Rules/Calculators/A&P                          Patient with aicd presents today with lightheadedness and shock from defib.  Interrogation of  Defibrillator with runs of vtach and defib x 2 Cardiology saw in consult Amiodarone infusing Labs reviewed appear wnl  Final Clinical Impression(s) / ED Diagnoses Final diagnoses:  V-tach [facility])  AICD discharge    Rx / DC Orders ED Discharge Orders    None       Pattricia Boss, MD 10/23/20 1645

## 2020-10-23 NOTE — Consult Note (Signed)
Cardiology Consultation:   Patient ID: [name] MRN: [id number]; DOB: 07-Mar-1946  Admit date: 10/23/2020 Date of Consult: 10/23/2020  Primary Care Provider: Hoyt Koch, MD [facility] Cardiologist: Mertie Moores, MD  [facility] Electrophysiologist:  Cristopher Peru, MD    Patient Profile:   [name] is a 74 y.o. male with a hx of coronary artery disease status post CABG in 1991, ischemic cardiomyopathy with an EF of 20 to 25% status post ICD, chronic systolic heart failure, VT with [employer] ICD, hypertension, hyperlipidemia who is being seen today for the evaluation of ICD shocks at the request of Glenetta Hew.  History of Present Illness:   [name] is a 74 year old male who presents to the hospital after ICD shocks.  He got out of bed and felt short of breath.  He started walking to the bathroom, got dizzy and lightheaded and felt extremely weak.  His ICD fired.  He felt a little better after that.  A little while later, he was getting dressed and his ICD fired again.  Both episodes had a short prodrome of weakness and dizziness.  He did not have palpitations or chest pain.  He came to the emergency room after his second ICD shock. After his 2nd ICD shock, he felt quite a bit better. His weakness, fatigue, and dizziness went away. He did receive multiple rounds of ATP this morning as well.   He is now status post left heart catheterization that showed patent grafts and elevated LVEDP.   Past Medical History:  Diagnosis Date  . Adult ADHD   . AICD (automatic cardioverter/defibrillator) present   . CAD (coronary artery disease)    Cath 2008, all grafts patent // Cath 2017 - all grafts patent   . 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 Carotid artery disease ([place])    Doppler, February, 2012, mild plaque, 0-39% bilateral, plan followup in one year  . Dyspepsia   . Ejection  fraction < 50%   . Elevated PSA 02/15/2011  . Esophageal stricture   . GERD (gastroesophageal reflux disease)   . HTN (hypertension)   . Hx of CABG    1991  . Hyperlipidemia   . 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 Myocardial infarction ([place]) 1998  . Prostate cancer ([place]) 2012   IMRT, ADT  . Thrombosis of arm    Left  . Venous thrombosis    Left arm    Past Surgical History:  Procedure Laterality Date  . CARDIAC CATHETERIZATION  12/05/06  . CARDIAC CATHETERIZATION N/A 12/21/2015   Procedure: Left Heart Cath and Coronary Angiography;  Surgeon: Lorretta Harp, MD;  Location: [facility];  Service: Cardiovascular;  Laterality: N/A;  . CORONARY ARTERY BYPASS GRAFT     LIMA_LAD, SVG-OM, Cx, RCA '91  . CYSTECTOMY     Hand  . 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 ICD Placement    . IMPLANTABLE CARDIOVERTER DEFIBRILLATOR (ICD) GENERATOR CHANGE  04/06/2015   Procedure: Icd Generator Change;  Surgeon: Evans Lance, MD;  Location: [facility];  Service: Cardiovascular;;  . menistectomies, left knee  '09  . ROTATOR CUFF REPAIR  2005, 2007  . Thumb Surgery    .  TONSILLECTOMY  2005   Malignant lesion, w/XRT     Home Medications:  Prior to Admission medications   Medication Sig Start Date End Date Taking? Authorizing Provider  aspirin 81 MG EC tablet Take 81 mg by mouth daily.     Yes [provider]  atorvastatin (LIPITOR) 80 MG tablet TAKE 1 TABLET(80 MG) BY MOUTH DAILY AT 6 PM Patient taking differently: Take 80 mg by mouth every evening.  03/02/20  Yes Nahser, Wonda Cheng, MD  buPROPion (WELLBUTRIN XL) 300 MG 24 hr tablet Take 300 mg by mouth daily.   Yes [provider]  carvedilol (COREG) 12.5 MG tablet TAKE 1 TABLET(12.5 MG) BY MOUTH TWICE DAILY Patient taking  differently: Take 12.5 mg by mouth in the morning and at bedtime.  03/11/20  Yes Nahser, Wonda Cheng, MD  EPINEPHrine 0.3 mg/0.3 mL IJ SOAJ injection Inject 0.3 mLs (0.3 mg total) into the muscle as needed (anaphylaxis). 06/17/18  Yes Deno Etienne, DO  escitalopram (LEXAPRO) 20 MG tablet Take 20 mg by mouth daily.     Yes [provider]  esomeprazole (NEXIUM) 20 MG packet Take 20 mg by mouth daily before breakfast.   Yes [provider]  losartan (COZAAR) 50 MG tablet TAKE 1 TABLET BY MOUTH DAILY Patient taking differently: Take 50 mg by mouth daily.  05/28/20  Yes Nahser, Wonda Cheng, MD  Multiple Vitamins-Minerals (OCUVITE PRESERVISION) TABS Take 1 tablet by mouth daily.    Yes [provider]  nitroGLYCERIN (NITROSTAT) 0.4 MG SL tablet Place 1 tablet (0.4 mg total) under the tongue every 5 (five) minutes as needed for chest pain. 12/23/15  Yes Bhagat, Bhavinkumar, PA  zolpidem (AMBIEN) 10 MG tablet Take 1 tablet by mouth at bedtime as needed for sleep.  02/28/14  Yes [provider]    Inpatient Medications: Scheduled Meds: . [MAR Hold] potassium chloride  40 mEq Oral Once  . sodium chloride flush  3 mL Intravenous Q12H   Continuous Infusions: . sodium chloride    . sodium chloride 100 mL/hr at 10/23/20 1333  . amiodarone 60 mg/hr (10/23/20 1213)   Followed by  . amiodarone     PRN Meds: sodium chloride, fentaNYL, Heparin (Porcine) in NaCl, iohexol, lidocaine (PF), midazolam, sodium chloride flush  Allergies:    Allergies  Allergen Reactions  . Other Anaphylaxis    Insect bites (Has EpiPen)    Social History:   Social History   Socioeconomic History  . Marital status: Married    Spouse name: Not on file  . Number of children: 0  . Years of education: 69  . Highest education level: Not on file  Occupational History  . Occupation: Retired    Fish farm manager: RETIRED    CommentOceanographer for [employer]  Tobacco Use  . Smoking status: Never Smoker    . Smokeless tobacco: Never Used  Vaping Use  . Vaping Use: Never used  Substance and Sexual Activity  . Alcohol use: No  . Drug use: No  . Sexual activity: Yes    Birth control/protection: None  Other Topics Concern  . Not on file  Social History Narrative   [employer], [employer]. 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 Marriage in good health, wife in good health. ACP discussed Jan '15: no CPR, no mechanical ventilation. He is referred to [email or web address] and provided with packet with HCPOA-his wife./ Advanced directive.   Fun/Hobby: Yard work, Architect working.    Social Determinants of Health  Financial Resource Strain:   . Difficulty of Paying Living Expenses: Not on file  Food Insecurity:   . Worried About Charity fundraiser in the Last Year: Not on file  . Ran Out of Food in the Last Year: Not on file  Transportation Needs:   . Lack of Transportation (Medical): Not on file  . Lack of Transportation (Non-Medical): Not on file  Physical Activity:   . Days of Exercise per Week: Not on file  . Minutes of Exercise per Session: Not on file  Stress:   . Feeling of Stress : Not on file  Social Connections:   . Frequency of Communication with Friends and Family: Not on file  . Frequency of Social Gatherings with Friends and Family: Not on file  . Attends Religious Services: Not on file  . Active Member of Clubs or Organizations: Not on file  . Attends Archivist Meetings: Not on file  . Marital Status: Not on file  Intimate Partner Violence:   . Fear of Current or Ex-Partner: Not on file  . Emotionally Abused: Not on file  . Physically Abused: Not on file  . Sexually Abused: Not on file    Family History:    Family History  Problem Relation Age of Onset  . Alzheimer's disease Mother   . Dementia Mother        living in [place]  . Hyperlipidemia Father   . Hypertension Father   . CAD Father        had pacemaker  . Congestive Heart Failure Father   . Heart  disease Brother        Bypass surgery  . Breast cancer Sister        survivor  . Colon cancer Neg Hx   . Prostate cancer Neg Hx   . Diabetes Neg Hx      ROS:  Please see the history of present illness.   All other ROS reviewed and negative.     Physical Exam/Data:   Vitals:   10/23/20 1420 10/23/20 1425 10/23/20 1430 10/23/20 1435  BP: 124/83 127/85 123/79 121/83  Pulse: 71 70 (!) 52 68  Resp: (!) 34 20 19 18   Temp:      TempSrc:      SpO2: 98% 98% 99% 98%  Weight:      Height:       No intake or output data in the 24 hours ending 10/23/20 1439 Last 3 Weights 10/23/2020 10/20/2020 03/09/2020  Weight (lbs) 183 lb 183 lb 182 lb 3.2 oz  Weight (kg) 83.008 kg 83.008 kg 82.645 kg     Body mass index is 24.82 kg/m.  General:  Well nourished, well developed, in no acute distress HEENT: normal Lymph: no adenopathy Neck: no JVD Endocrine:  No thryomegaly Vascular: No carotid bruits; FA pulses 2+ bilaterally without bruits  Cardiac:  normal S1, S2; RRR; 2 out of 6 systolic murmur Lungs:  clear to auscultation bilaterally, no wheezing, rhonchi or rales  Abd: soft, nontender, no hepatomegaly  Ext: no edema Musculoskeletal:  No deformities, BUE and BLE strength normal and equal Skin: warm and dry  Neuro:  CNs 2-12 intact, no focal abnormalities noted Psych:  Normal affect   EKG:  The EKG was personally reviewed and demonstrates: Sinus rhythm, PVCs, IVCD Telemetry:  Telemetry was personally reviewed and demonstrates: Sinus rhythm, PVCs  Relevant CV Studies: Left heart cath October 23, 2020  Ost LAD to Prox LAD lesion is 100%  stenosed. Patent LIMA to LAD.  Ost RCA to Prox RCA lesion is 100% stenosed. Patent SVG to RCA.  Mid Cx lesion is 100% stenosed. Patent jump SVG to OM2/OM3  1st Mrg lesion is 50% stenosed. This is a small vessel, unchanged from prior cath.  LV end diastolic pressure is mildly elevated. LVEDP 20 mm Hg.  There is no aortic valve  stenosis.  Tortuous left subclavian. Versacore wire used to navigate.  Laboratory Data:  High Sensitivity Troponin:   Recent Labs  Lab 10/23/20 1044  TROPONINIHS 55*     Chemistry Recent Labs  Lab 10/20/20 0921 10/23/20 1044 10/23/20 1126  NA 139 140 144  K 3.8 3.5 3.5  CL 103 108 106  CO2 22 22  --   GLUCOSE 97 114* 110*  BUN 22 16 20   CREATININE 1.12 1.27* 1.10  CALCIUM 8.9 8.4*  --   GFRNONAA 65 >60  --   GFRAA 75  --   --   ANIONGAP  --  10  --     No results for input(s): PROT, ALBUMIN, AST, ALT, ALKPHOS, BILITOT in the last 168 hours. Hematology Recent Labs  Lab 10/20/20 0921 10/23/20 1044 10/23/20 1126  WBC 7.4 6.6  --   RBC 4.30 3.94*  --   HGB 12.3* 11.7* 12.6*  HCT 37.1* 36.5* 37.0*  MCV 86 92.6  --   MCH 28.6 29.7  --   MCHC 33.2 32.1  --   RDW 13.8 14.4  --   PLT 250 226  --    BNPNo results for input(s): BNP, PROBNP in the last 168 hours.  DDimer No results for input(s): DDIMER in the last 168 hours.   Radiology/Studies:  DG Chest 2 View  Result Date: 10/20/2020 CLINICAL DATA:  74 year old male with a history of shortness of breath EXAM: CHEST - 2 VIEW COMPARISON:  05/04/2010 FINDINGS: Cardiomediastinal silhouette unchanged. Surgical changes of median sternotomy and CABG. Unchanged cardiac pacing device/AICD from the left subclavian approach. Interval replacement of the generator. No pneumothorax. No confluent airspace disease. Stigmata of emphysema, with increased retrosternal airspace, flattened hemidiaphragms, increased AP diameter, and hyperinflation on the AP view. Blunting of the costophrenic angles with meniscus on the lateral view. No interlobular septal thickening. No displaced fracture.  Degenerative changes of the spine. IMPRESSION: Emphysema with blunting at the costophrenic angles, potentially scarring or trace pleural effusion. No evidence of overt edema or lobar pneumonia. Surgical changes of median sternotomy and CABG, as well as  AICD. Electronically Signed   By: Corrie Mckusick D.O.   On: 10/20/2020 15:46   DG Chest Portable 1 View  Result Date: 10/23/2020 CLINICAL DATA:  AICD. EXAM: PORTABLE CHEST 1 VIEW COMPARISON:  CT 12/21/2015.  Chest x-ray 05/04/2010. FINDINGS: AICD in stable position. Prior CABG. Stable cardiomegaly. No focal infiltrate. Mild left base subsegmental atelectasis. No pleural effusion or pneumothorax. Right costophrenic angle incompletely imaged. Degenerative change thoracic spine. IMPRESSION: 1. AICD in stable position. Prior CABG. Stable cardiomegaly. 2. Mild left base subsegmental atelectasis. Electronically Signed   By: [place]   On: 10/23/2020 11:22     Assessment and Plan:   1. Chronic systolic heart failure due to ischemic cardiomyopathy: Currently on optimal medical therapy with losartan and carvedilol.  He may benefit from a switch to [place] during his hospitalization.  He does have signs of volume overload on his left heart catheterization.  We Nycole Kawahara plan to diurese throughout his hospitalization. 2. Ventricular tachycardia: Noted on device interrogation  with 2 subsequent ICD shocks.  He is status post left heart catheterization without obvious cause.  He does have a history of nonsustained VT in the past.  Due to his VT, we Karina Nofsinger continue him on IV amiodarone throughout the night tonight. 3. Coronary artery disease status post CABG: Left heart catheterization today shows occluded native vessels but patent grafts.  No obvious cause for his VT.   For questions or updates, please contact [place] Please consult [email or web address] for contact info under    Signed, Dayzee Trower Meredith Leeds, MD  10/23/2020 2:39 PM

## 2020-10-23 NOTE — Progress Notes (Signed)
CRITICAL VALUE ALERT  Critical Value:  TNI 84  Date & Time Notied:  10/23/20 1859  Provider Notified: Bernadene Person PA  Orders Received/Actions taken: Waiting for response

## 2020-10-24 ENCOUNTER — Inpatient Hospital Stay (HOSPITAL_COMMUNITY): Payer: Medicare Other

## 2020-10-24 DIAGNOSIS — R9431 Abnormal electrocardiogram [ECG] [EKG]: Secondary | ICD-10-CM

## 2020-10-24 LAB — BASIC METABOLIC PANEL
Anion gap: 11 (ref 5–15)
BUN: 19 mg/dL (ref 8–23)
CO2: 24 mmol/L (ref 22–32)
Calcium: 8.7 mg/dL — ABNORMAL LOW (ref 8.9–10.3)
Chloride: 105 mmol/L (ref 98–111)
Creatinine, Ser: 1.32 mg/dL — ABNORMAL HIGH (ref 0.61–1.24)
GFR, Estimated: 57 mL/min — ABNORMAL LOW (ref >60)
Glucose, Bld: 105 mg/dL — ABNORMAL HIGH (ref 70–99)
Potassium: 3.8 mmol/L (ref 3.5–5.1)
Sodium: 140 mmol/L (ref 135–145)

## 2020-10-24 LAB — ECHOCARDIOGRAM COMPLETE
AR max vel: 1.86 cm2
AV Area VTI: 1.64 cm2
AV Area mean vel: 1.76 cm2
AV Mean grad: 3 mmHg
AV Peak grad: 5 mmHg
Ao pk vel: 1.12 m/s
Height: 72 in
MV M vel: 5.35 m/s
MV Peak grad: 114.5 mmHg
P 1/2 time: 486 msec
Radius: 0.7 cm
S' Lateral: 6 cm
Weight: 2928 oz

## 2020-10-24 LAB — CBC
HCT: 35.8 % — ABNORMAL LOW (ref 39.0–52.0)
Hemoglobin: 11.1 g/dL — ABNORMAL LOW (ref 13.0–17.0)
MCH: 27.8 pg (ref 26.0–34.0)
MCHC: 31 g/dL (ref 30.0–36.0)
MCV: 89.7 fL (ref 80.0–100.0)
Platelets: 217 10*3/uL (ref 150–400)
RBC: 3.99 MIL/uL — ABNORMAL LOW (ref 4.22–5.81)
RDW: 14.4 % (ref 11.5–15.5)
WBC: 6.7 10*3/uL (ref 4.0–10.5)
nRBC: 0 % (ref 0.0–0.2)

## 2020-10-24 LAB — LIPID PANEL
Cholesterol: 134 mg/dL (ref 0–200)
HDL: 39 mg/dL — ABNORMAL LOW (ref >40)
LDL Cholesterol: 81 mg/dL (ref 0–99)
Total CHOL/HDL Ratio: 3.4 RATIO
Triglycerides: 68 mg/dL (ref <150)
VLDL: 14 mg/dL (ref 0–40)

## 2020-10-24 LAB — MAGNESIUM: Magnesium: 1.9 mg/dL (ref 1.7–2.4)

## 2020-10-24 MED ORDER — AMIODARONE HCL 200 MG PO TABS
400.0000 mg | ORAL_TABLET | Freq: Two times a day (BID) | ORAL | Status: DC
  Administered 2020-10-24 (×2): 400 mg via ORAL
  Filled 2020-10-24 (×3): qty 2

## 2020-10-24 MED ORDER — SACUBITRIL-VALSARTAN 24-26 MG PO TABS
1.0000 | ORAL_TABLET | Freq: Two times a day (BID) | ORAL | Status: DC
  Administered 2020-10-24 (×2): 1 via ORAL
  Filled 2020-10-24 (×3): qty 1

## 2020-10-24 MED ORDER — POTASSIUM CHLORIDE CRYS ER 20 MEQ PO TBCR
30.0000 meq | EXTENDED_RELEASE_TABLET | Freq: Once | ORAL | Status: AC
  Administered 2020-10-24: 30 meq via ORAL
  Filled 2020-10-24: qty 1

## 2020-10-24 NOTE — Progress Notes (Signed)
Progress Note  Patient Name: [name] Date of Encounter: 10/24/2020  [facility] Cardiologist: Mertie Moores, MD   Subjective   Currently feeling well.  No chest pain or shortness of breath.  Inpatient Medications    Scheduled Meds: . aspirin  324 mg Oral NOW   Or  . aspirin  300 mg Rectal NOW  . aspirin EC  81 mg Oral Daily  . atorvastatin  80 mg Oral QPM  . buPROPion  300 mg Oral Daily  . carvedilol  12.5 mg Oral BID WC  . esomeprazole  20 mg Oral QAC breakfast  . heparin  5,000 Units Subcutaneous Q8H  . losartan  50 mg Oral Daily  . multivitamin  1 tablet Oral Daily  . sodium chloride flush  3 mL Intravenous Q12H  . sodium chloride flush  3 mL Intravenous Q12H   Continuous Infusions: . sodium chloride    . sodium chloride    . amiodarone 30 mg/hr (10/24/20 0147)   PRN Meds: sodium chloride, sodium chloride, acetaminophen, nitroGLYCERIN, sodium chloride flush, sodium chloride flush, zolpidem   Vital Signs    Vitals:   10/23/20 1652 10/23/20 2100 10/24/20 0012 10/24/20 0500  BP:  113/76 113/68 134/82  Pulse:  (!) 56 (!) 59 62  Resp:  18 17 18   Temp: 98.6 F (37 C) 98.8 F (37.1 C) 98.7 F (37.1 C) 98 F (36.7 C)  TempSrc: Oral Oral Oral Oral  SpO2:  97% 95% 97%  Weight:      Height:        Intake/Output Summary (Last 24 hours) at 10/24/2020 0825 Last data filed at 10/23/2020 2359 Gross per 24 hour  Intake 826.64 ml  Output 1995 ml  Net -1168.36 ml   Last 3 Weights 10/23/2020 10/20/2020 03/09/2020  Weight (lbs) 183 lb 183 lb 182 lb 3.2 oz  Weight (kg) 83.008 kg 83.008 kg 82.645 kg      Telemetry    Sinus rhythm with PVCs- Personally Reviewed  ECG    None new- Personally Reviewed  Physical Exam   GEN: No acute distress.   Neck: No JVD Cardiac: RRR, no murmurs, rubs, or gallops.  Respiratory: Clear to auscultation bilaterally. GI: Soft, nontender, non-distended  MS: No edema; No deformity. Neuro:  Nonfocal  Psych: Normal  affect   Labs    High Sensitivity Troponin:   Recent Labs  Lab 10/23/20 1044 10/23/20 1717  TROPONINIHS 55* 84*      Chemistry Recent Labs  Lab 10/20/20 0921 10/20/20 0921 10/23/20 1044 10/23/20 1044 10/23/20 1126 10/23/20 1717 10/24/20 0400  NA 139  --  140  --  144  --  140  K 3.8   < > 3.5  --  3.5  --  3.8  CL 103   < > 108  --  106  --  105  CO2 22  --  22  --   --   --  24  GLUCOSE 97  --  114*  --  110*  --  105*  BUN 22  --  16  --  20  --  19  CREATININE 1.12   < > 1.27*   < > 1.10 1.14 1.32*  CALCIUM 8.9  --  8.4*  --   --   --  8.7*  GFRNONAA 65  --  >60  --   --  >60 57*  GFRAA 75  --   --   --   --   --   --  ANIONGAP  --   --  10  --   --   --  11   < > = values in this interval not displayed.     Hematology Recent Labs  Lab 10/23/20 1044 10/23/20 1044 10/23/20 1126 10/23/20 1717 10/24/20 0400  WBC 6.6  --   --  5.7 6.7  RBC 3.94*  --   --  4.42 3.99*  HGB 11.7*   < > 12.6* 12.4* 11.1*  HCT 36.5*   < > 37.0* 39.7 35.8*  MCV 92.6  --   --  89.8 89.7  MCH 29.7  --   --  28.1 27.8  MCHC 32.1  --   --  31.2 31.0  RDW 14.4  --   --  14.4 14.4  PLT 226  --   --  234 217   < > = values in this interval not displayed.    BNPNo results for input(s): BNP, PROBNP in the last 168 hours.   DDimer No results for input(s): DDIMER in the last 168 hours.   Radiology    CARDIAC CATHETERIZATION  Addendum Date: 10/23/2020    Ost LAD to Prox LAD lesion is 100% stenosed. Patent LIMA to LAD.  Ost RCA to Prox RCA lesion is 100% stenosed. Patent SVG to RCA.  Mid Cx lesion is 100% stenosed. Patent jump SVG to OM2/OM3  1st Mrg lesion is 50% stenosed. This is a small vessel, unchanged from prior cath.  LV end diastolic pressure is mildly elevated. LVEDP 20 mm Hg.  There is no aortic valve stenosis.  Tortuous left subclavian. Versacore wire used to navigate.  Continue medical therapy for systolic heart failure.  Further management per EP for VT. Left message  for wife [phone].  Unable to find her in the [facility] waiting room.   Result Date: 10/23/2020  Ost LAD to Prox LAD lesion is 100% stenosed. Patent LIMA to LAD.  Ost RCA to Prox RCA lesion is 100% stenosed. Patent SVG to RCA.  Mid Cx lesion is 100% stenosed. Patent jump SVG to OM2/OM3  1st Mrg lesion is 50% stenosed. This is a small vessel, unchanged from prior cath.  LV end diastolic pressure is mildly elevated. LVEDP 20 mm Hg.  There is no aortic valve stenosis.  Tortuous left subclavian. Versacore wire used to navigate.  Continue medical therapy for systolic heart failure.  Further management per EP for VT.   DG Chest Portable 1 View  Result Date: 10/23/2020 CLINICAL DATA:  AICD. EXAM: PORTABLE CHEST 1 VIEW COMPARISON:  CT 12/21/2015.  Chest x-ray 05/04/2010. FINDINGS: AICD in stable position. Prior CABG. Stable cardiomegaly. No focal infiltrate. Mild left base subsegmental atelectasis. No pleural effusion or pneumothorax. Right costophrenic angle incompletely imaged. Degenerative change thoracic spine. IMPRESSION: 1. AICD in stable position. Prior CABG. Stable cardiomegaly. 2. Mild left base subsegmental atelectasis. Electronically Signed   By: Marcello Moores  Register   On: 10/23/2020 11:22    Cardiac Studies   LHC 10/23/20  Ost LAD to Prox LAD lesion is 100% stenosed. Patent LIMA to LAD.  Ost RCA to Prox RCA lesion is 100% stenosed. Patent SVG to RCA.  Mid Cx lesion is 100% stenosed. Patent jump SVG to OM2/OM3  1st Mrg lesion is 50% stenosed. This is a small vessel, unchanged from prior cath.  LV end diastolic pressure is mildly elevated. LVEDP 20 mm Hg.  There is no aortic valve stenosis.  Tortuous left subclavian. Versacore wire used to navigate.   Patient  Profile     74 y.o. male with a history of coronary artery disease and ischemic cardiomyopathy status post CABG and ICD implant who presented to the hospital with VT and multiple rounds of ATP and ICD shocks.  Assessment & Plan     1.  Chronic systolic heart failure due to ischemic cardiomyopathy: Currently on optimal medical therapy with losartan and carvedilol.  He was volume overloaded and is net out 1.1 L.  His creatinine is mildly elevated.  He is likely euvolemic at this point.  We Kaley Jutras switch him back to his chronic volume control.  We Estephania Licciardi also stop his losartan and start him on Entresto.  2.  Ventricular tachycardia: Likely scar mediated.  He had a left heart cath station that showed no obvious sources of ischemia.  He is currently on IV amiodarone.  We Ghali Morissette switch him to p.o. amiodarone today with likely discharge tomorrow.  3.  Coronary artery disease status post CABG: Left heart catheterization showed occluded native vessels but patent grafts.  No current chest pain.  4.  Hypokalemia: In the setting of ventricular tachycardia, Dearion Huot keep potassium greater than 4.  Repletion ordered.  For questions or updates, please contact [place] Please consult [email or web address] for contact info under        Signed, Anzley Dibbern Meredith Leeds, MD  10/24/2020, 8:25 AM

## 2020-10-24 NOTE — Progress Notes (Signed)
  Echocardiogram 2D Echocardiogram has been performed.  Marybelle Killings 10/24/2020, 4:41 PM

## 2020-10-24 NOTE — Plan of Care (Signed)

## 2020-10-24 NOTE — Progress Notes (Signed)
Late entry for 10/23/20 @  20:00  Paged cardiology regarding pt's HR in 50's. Per Melina Copa, PA w/ cardiology continue IV amiodarone & page on call provider if HR drops to 40's. Will continue to monitor.

## 2020-10-25 ENCOUNTER — Other Ambulatory Visit: Payer: Self-pay | Admitting: Physician Assistant

## 2020-10-25 DIAGNOSIS — I5022 Chronic systolic (congestive) heart failure: Secondary | ICD-10-CM

## 2020-10-25 MED ORDER — SACUBITRIL-VALSARTAN 24-26 MG PO TABS: 1.0000 | ORAL_TABLET | Freq: Two times a day (BID) | ORAL | 3 refills | Status: DC

## 2020-10-25 MED ORDER — AMIODARONE HCL 400 MG PO TABS: ORAL_TABLET | ORAL | 3 refills | Status: DC

## 2020-10-25 MED ORDER — SACUBITRIL-VALSARTAN 24-26 MG PO TABS: 1.0000 | ORAL_TABLET | Freq: Two times a day (BID) | ORAL | 0 refills | Status: DC

## 2020-10-25 NOTE — Progress Notes (Signed)
Progress Note  Patient Name: [name] Date of Encounter: 10/25/2020  [facility] Cardiologist: Mertie Moores, MD   Subjective   Currently feeling well.  No further ventricular arrhythmias.  Is tolerating amiodarone without issue.  Inpatient Medications    Scheduled Meds: . amiodarone  400 mg Oral BID  . aspirin EC  81 mg Oral Daily  . atorvastatin  80 mg Oral QPM  . buPROPion  300 mg Oral Daily  . carvedilol  12.5 mg Oral BID WC  . esomeprazole  20 mg Oral QAC breakfast  . heparin  5,000 Units Subcutaneous Q8H  . multivitamin  1 tablet Oral Daily  . sacubitril-valsartan  1 tablet Oral BID  . sodium chloride flush  3 mL Intravenous Q12H  . sodium chloride flush  3 mL Intravenous Q12H   Continuous Infusions: . sodium chloride    . sodium chloride     PRN Meds: sodium chloride, sodium chloride, acetaminophen, nitroGLYCERIN, sodium chloride flush, sodium chloride flush, zolpidem   Vital Signs    Vitals:   10/24/20 0500 10/24/20 1251 10/24/20 2055 10/25/20 0616  BP: 134/82 132/87 111/72 119/75  Pulse: 62 66 60 67  Resp: 18 17 18 18   Temp: 98 F (36.7 C) 98 F (36.7 C) 98 F (36.7 C) 97.9 F (36.6 C)  TempSrc: Oral Oral Oral Oral  SpO2: 97% 99% 98% 94%  Weight:    79.2 kg  Height:        Intake/Output Summary (Last 24 hours) at 10/25/2020 0759 Last data filed at 10/25/2020 0620 Gross per 24 hour  Intake 1088.29 ml  Output 800 ml  Net 288.29 ml   Last 3 Weights 10/25/2020 10/23/2020 10/20/2020  Weight (lbs) 174 lb 9.6 oz 183 lb 183 lb  Weight (kg) 79.198 kg 83.008 kg 83.008 kg      Telemetry    Sinus rhythm with PVCs- Personally Reviewed  ECG    Sinus rhythm- Personally Reviewed  Physical Exam   GEN: Well nourished, well developed, in no acute distress  HEENT: normal  Neck: no JVD, carotid bruits, or masses Cardiac: RRR; no murmurs, rubs, or gallops,no edema  Respiratory:  clear to auscultation bilaterally, normal work of  breathing GI: soft, nontender, nondistended, + BS MS: no deformity or atrophy  Skin: warm and dry, device site well healed Neuro:  Strength and sensation are intact Psych: euthymic mood, full affect'  Labs    High Sensitivity Troponin:   Recent Labs  Lab 10/23/20 1044 10/23/20 1717  TROPONINIHS 55* 84*      Chemistry Recent Labs  Lab 10/20/20 0921 10/20/20 0921 10/23/20 1044 10/23/20 1044 10/23/20 1126 10/23/20 1717 10/24/20 0400  NA 139  --  140  --  144  --  140  K 3.8   < > 3.5  --  3.5  --  3.8  CL 103   < > 108  --  106  --  105  CO2 22  --  22  --   --   --  24  GLUCOSE 97  --  114*  --  110*  --  105*  BUN 22  --  16  --  20  --  19  CREATININE 1.12   < > 1.27*   < > 1.10 1.14 1.32*  CALCIUM 8.9  --  8.4*  --   --   --  8.7*  GFRNONAA 65  --  >60  --   --  >60  57*  GFRAA 75  --   --   --   --   --   --   ANIONGAP  --   --  10  --   --   --  11   < > = values in this interval not displayed.     Hematology Recent Labs  Lab 10/23/20 1044 10/23/20 1044 10/23/20 1126 10/23/20 1717 10/24/20 0400  WBC 6.6  --   --  5.7 6.7  RBC 3.94*  --   --  4.42 3.99*  HGB 11.7*   < > 12.6* 12.4* 11.1*  HCT 36.5*   < > 37.0* 39.7 35.8*  MCV 92.6  --   --  89.8 89.7  MCH 29.7  --   --  28.1 27.8  MCHC 32.1  --   --  31.2 31.0  RDW 14.4  --   --  14.4 14.4  PLT 226  --   --  234 217   < > = values in this interval not displayed.    BNPNo results for input(s): BNP, PROBNP in the last 168 hours.   DDimer No results for input(s): DDIMER in the last 168 hours.   Radiology    CARDIAC CATHETERIZATION  Addendum Date: 10/23/2020    Ost LAD to Prox LAD lesion is 100% stenosed. Patent LIMA to LAD.  Ost RCA to Prox RCA lesion is 100% stenosed. Patent SVG to RCA.  Mid Cx lesion is 100% stenosed. Patent jump SVG to OM2/OM3  1st Mrg lesion is 50% stenosed. This is a small vessel, unchanged from prior cath.  LV end diastolic pressure is mildly elevated. LVEDP 20 mm Hg.   There is no aortic valve stenosis.  Tortuous left subclavian. Versacore wire used to navigate.  Continue medical therapy for systolic heart failure.  Further management per EP for VT. Left message for wife [phone].  Unable to find her in the [facility] waiting room.   Result Date: 10/23/2020  Ost LAD to Prox LAD lesion is 100% stenosed. Patent LIMA to LAD.  Ost RCA to Prox RCA lesion is 100% stenosed. Patent SVG to RCA.  Mid Cx lesion is 100% stenosed. Patent jump SVG to OM2/OM3  1st Mrg lesion is 50% stenosed. This is a small vessel, unchanged from prior cath.  LV end diastolic pressure is mildly elevated. LVEDP 20 mm Hg.  There is no aortic valve stenosis.  Tortuous left subclavian. Versacore wire used to navigate.  Continue medical therapy for systolic heart failure.  Further management per EP for VT.   DG Chest Portable 1 View  Result Date: 10/23/2020 CLINICAL DATA:  AICD. EXAM: PORTABLE CHEST 1 VIEW COMPARISON:  CT 12/21/2015.  Chest x-ray 05/04/2010. FINDINGS: AICD in stable position. Prior CABG. Stable cardiomegaly. No focal infiltrate. Mild left base subsegmental atelectasis. No pleural effusion or pneumothorax. Right costophrenic angle incompletely imaged. Degenerative change thoracic spine. IMPRESSION: 1. AICD in stable position. Prior CABG. Stable cardiomegaly. 2. Mild left base subsegmental atelectasis. Electronically Signed   By: Marcello Moores  Register   On: 10/23/2020 11:22   ECHOCARDIOGRAM COMPLETE  Result Date: 10/24/2020    ECHOCARDIOGRAM REPORT   Patient Name:   [name] Date of Exam: 10/24/2020 Medical Rec #:  [id number]          Height:       72.0 in Accession #:    [id number]         Weight:       183.0 lb Date  of Birth:  1946-04-19         BSA:          2.052 m Patient Age:    74 years           BP:           134/87 mmHg Patient Gender: M                  HR:           62 bpm. Exam Location:  Inpatient Procedure: 2D Echo, Cardiac Doppler and Color Doppler Indications:     Abnormal ECG  History:        Patient has prior history of Echocardiogram examinations, most                 recent 08/18/2015. CAD and Previous Myocardial Infarction,                 Defibrillator; Risk Factors:Dyslipidemia. ETOH abuse.                 Ventricular tachycardia.  Sonographer:    Clayton Lefort RDCS (AE) Referring Phys: [place] Comments: Suboptimal subcostal window. IMPRESSIONS  1. There is no left ventricular thrombus. Left ventricular ejection fraction, by estimation, is 20 to 25%. The left ventricle has severely decreased function. The left ventricle demonstrates regional wall motion abnormalities (see scoring diagram/findings for description). The left ventricular internal cavity size was moderately to severely dilated. Left ventricular diastolic parameters are consistent with Grade II diastolic dysfunction (pseudonormalization). Elevated left atrial pressure. There is mild apical dyskinesis. There is akinesis of the mid-apical anterior wall and septum and the inferior apex.  2. Right ventricular systolic function is mildly reduced. The right ventricular size is normal. There is normal pulmonary artery systolic pressure.  3. Left atrial size was severely dilated.  4. There is mitral leaflet malcoaptation due to adverse chamber remodeling. Effective regurgitant orifice area 0.22 cm, regurgitant volume 43 ml, regurgitant fraction 55%. The mitral valve is grossly normal. Moderate to severe mitral valve regurgitation. No evidence of mitral stenosis.  5. The aortic valve is tricuspid. Aortic valve regurgitation is mild. Mild aortic valve sclerosis is present, with no evidence of aortic valve stenosis.  6. The inferior vena cava is normal in size with greater than 50% respiratory variability, suggesting right atrial pressure of 3 mmHg. FINDINGS  Left Ventricle: There is no left ventricular thrombus. Left ventricular ejection fraction, by estimation, is 20 to 25%. The left ventricle  has severely decreased function. The left ventricle demonstrates regional wall motion abnormalities. The left ventricular internal cavity size was moderately to severely dilated. There is no left ventricular hypertrophy. Left ventricular diastolic parameters are consistent with Grade II diastolic dysfunction (pseudonormalization). Elevated left atrial pressure.  LV Wall Scoring: The apical lateral segment, apical septal segment, and apex are dyskinetic. The mid and distal anterior wall and apical inferior segment are akinetic. There is mild apical dyskinesis. There is akinesis of the mid-apical anterior wall and septum and the inferior apex. Right Ventricle: The right ventricular size is normal. No increase in right ventricular wall thickness. Right ventricular systolic function is mildly reduced. There is normal pulmonary artery systolic pressure. The tricuspid regurgitant velocity is 2.86 m/s, and with an assumed right atrial pressure of 3 mmHg, the estimated right ventricular systolic pressure is 13.2 mmHg. Left Atrium: Left atrial size was severely dilated. Right Atrium: Right atrial size was normal in size. Pericardium: There  is no evidence of pericardial effusion. Mitral Valve: There is mitral leaflet malcoaptation due to adverse chamber remodeling. Effective regurgitant orifice area 0.22 cm, regurgitant volume 43 ml, regurgitant fraction 55%. The mitral valve is grossly normal. Moderate to severe mitral valve regurgitation, with eccentric posteriorly directed jet. No evidence of mitral valve stenosis. Pulmonary venous flow is blunted (decreased). Tricuspid Valve: The tricuspid valve is normal in structure. Tricuspid valve regurgitation is mild. Aortic Valve: The aortic valve is tricuspid. Aortic valve regurgitation is mild. Aortic regurgitation PHT measures 486 msec. Mild aortic valve sclerosis is present, with no evidence of aortic valve stenosis. Aortic valve mean gradient measures 3.0 mmHg. Aortic valve  peak gradient measures 5.0 mmHg. Aortic valve area, by VTI measures 1.64 cm. Pulmonic Valve: The pulmonic valve was normal in structure. Pulmonic valve regurgitation is not visualized. Aorta: The aortic root and ascending aorta are structurally normal, with no evidence of dilitation. Venous: The inferior vena cava is normal in size with greater than 50% respiratory variability, suggesting right atrial pressure of 3 mmHg. IAS/Shunts: No atrial level shunt detected by color flow Doppler. Additional Comments: A pacer wire is visualized.  LEFT VENTRICLE PLAX 2D LVIDd:         6.80 cm LVIDs:         6.00 cm LV PW:         1.30 cm LV IVS:        1.00 cm LVOT diam:     2.00 cm LV SV:         35 LV SV Index:   17 LVOT Area:     3.14 cm  RIGHT VENTRICLE            IVC RV Basal diam:  3.90 cm    IVC diam: 1.60 cm RV Mid diam:    3.40 cm RV S prime:     6.94 cm/s TAPSE (M-mode): 1.8 cm LEFT ATRIUM              Index       RIGHT ATRIUM           Index LA diam:        4.80 cm  2.34 cm/m  RA Area:     17.40 cm LA Vol (A2C):   168.0 ml 81.89 ml/m RA Volume:   48.60 ml  23.69 ml/m LA Vol (A4C):   137.0 ml 66.78 ml/m LA Biplane Vol: 154.0 ml 75.06 ml/m  AORTIC VALVE AV Area (Vmax):    1.86 cm AV Area (Vmean):   1.76 cm AV Area (VTI):     1.64 cm AV Vmax:           112.00 cm/s AV Vmean:          83.800 cm/s AV VTI:            0.211 m AV Peak Grad:      5.0 mmHg AV Mean Grad:      3.0 mmHg LVOT Vmax:         66.20 cm/s LVOT Vmean:        47.000 cm/s LVOT VTI:          0.110 m LVOT/AV VTI ratio: 0.52 AI PHT:            486 msec  AORTA Ao Root diam: 3.60 cm Ao Asc diam:  3.60 cm MR Peak grad:    114.5 mmHg  TRICUSPID VALVE MR Mean grad:    69.0 mmHg   TR  Peak grad:   32.7 mmHg MR Vmax:         535.00 cm/s TR Vmax:        286.00 cm/s MR Vmean:        384.0 cm/s MR PISA:         3.08 cm    SHUNTS MR PISA Eff ROA: 22 mm      Systemic VTI:  0.11 m MR PISA Radius:  0.70 cm     Systemic Diam: 2.00 cm Sanda Klein MD  Electronically signed by Sanda Klein MD Signature Date/Time: 10/24/2020/4:51:40 PM    Final     Cardiac Studies   LHC 10/23/20  Ost LAD to Prox LAD lesion is 100% stenosed. Patent LIMA to LAD.  Ost RCA to Prox RCA lesion is 100% stenosed. Patent SVG to RCA.  Mid Cx lesion is 100% stenosed. Patent jump SVG to OM2/OM3  1st Mrg lesion is 50% stenosed. This is a small vessel, unchanged from prior cath.  LV end diastolic pressure is mildly elevated. LVEDP 20 mm Hg.  There is no aortic valve stenosis.  Tortuous left subclavian. Versacore wire used to navigate.  TTE 10/24/20 1. There is no left ventricular thrombus. Left ventricular ejection  fraction, by estimation, is 20 to 25%. The left ventricle has severely  decreased function. The left ventricle demonstrates regional wall motion  abnormalities (see scoring  diagram/findings for description). The left ventricular internal cavity  size was moderately to severely dilated. Left ventricular diastolic  parameters are consistent with Grade II diastolic dysfunction  (pseudonormalization). Elevated left atrial  pressure. There is mild apical dyskinesis. There is akinesis of the  mid-apical anterior wall and septum and the inferior apex.  2. Right ventricular systolic function is mildly reduced. The right  ventricular size is normal. There is normal pulmonary artery systolic  pressure.  3. Left atrial size was severely dilated.  4. There is mitral leaflet malcoaptation due to adverse chamber  remodeling. Effective regurgitant orifice area 0.22 cm, regurgitant  volume 43 ml, regurgitant fraction 55%. The mitral valve is grossly  normal. Moderate to severe mitral valve  regurgitation. No evidence of mitral stenosis.  5. The aortic valve is tricuspid. Aortic valve regurgitation is mild.  Mild aortic valve sclerosis is present, with no evidence of aortic valve  stenosis.  6. The inferior vena cava is normal in size with greater  than 50%  respiratory variability, suggesting right atrial pressure of 3 mmHg.   Patient Profile     74 y.o. male with a history of coronary artery disease and ischemic cardiomyopathy status post CABG and ICD implant who presented to the hospital with VT and multiple rounds of ATP and ICD shocks.  Assessment & Plan    1.  Chronic systolic heart failure due to ischemic cardiomyopathy: Approximately 1 L net negative.  His creatinine has remained stable.  He appears to be euvolemic at this point.  We Damica Gravlin continue his current dose of carvedilol.  Did switch him to [facility] yesterday which can be continued at discharge.  If his blood pressure remained stable as an outpatient, Aldactone and Wilder Glade could be added as well.  2.  Ventricular tachycardia: Likely scar mediated.  Left heart catheterization showed stable coronary disease.  Transthoracic echo showed no change in his ejection fraction.  He has been switched to p.o. amiodarone.  He can be discharged on 400 mg twice daily for 2 weeks followed by 400 mg daily for 2 weeks followed by 200  mg a day.  He needs follow-up in EP clinic with Dr. Lovena Le in 1 to 2 months.  3.  Coronary artery disease status post CABG: Left heart catheterization showed a occluded native vessels but stable graft.  No current chest pain.  For questions or updates, please contact [place] Please consult [email or web address] for contact info under        Signed, Sherene Plancarte Meredith Leeds, MD  10/25/2020, 7:59 AM

## 2020-10-25 NOTE — TOC Transition Note (Signed)
Transition of Care [facility]) - CM/SW Discharge Note   Patient Details  Name: [name] MRN: [id number] Date of Birth: 1946/01/28  [facility] CM/SW Contact:  Carles Collet, RN Phone Number: 10/25/2020, 9:27 AM   Clinical Narrative:   Patient provided with 30 day Entresto coupon. He verbalized understanding of how to use.           Patient Goals and CMS Choice        Discharge Placement                       Discharge Plan and Services                                     Social Determinants of Health (SDOH) Interventions     Readmission Risk Interventions No flowsheet data found.

## 2020-10-25 NOTE — Discharge Summary (Addendum)
Discharge Summary    Patient ID: KANISHK STROEBEL MRN: [id number]; DOB: Jun 25, 1946  Admit date: 10/23/2020 Discharge date: 10/25/2020  Primary Care Provider: Hoyt Koch, MD  Primary Cardiologist: Mertie Moores, MD  Primary Electrophysiologist:  Cristopher Peru, MD   Discharge Diagnoses    Principal Problem:   VT (ventricular tachycardia) [facility]) Active Problems:   Cardiomyopathy, ischemic   Coronary artery disease, occlusive   Essential hypertension   Hyperlipidemia with target LDL less than 70   ICD (implantable cardioverter-defibrillator) in place   Hx of CABG   Chronic combined systolic and diastolic CHF (congestive heart failure) ([place])   DOE (dyspnea on exertion)   URI (upper respiratory infection)    Diagnostic Studies/Procedures    Cath 10/23/2020 Ost LAD to Prox LAD lesion is 100% stenosed. Patent LIMA to LAD. Ost RCA to Prox RCA lesion is 100% stenosed. Patent SVG to RCA. Mid Cx lesion is 100% stenosed. Patent jump SVG to OM2/OM3 1st Mrg lesion is 50% stenosed. This is a small vessel, unchanged from prior cath. LV end diastolic pressure is mildly elevated. LVEDP 20 mm Hg. There is no aortic valve stenosis. Tortuous left subclavian. Versacore wire used to navigate.   Continue medical therapy for systolic heart failure.  Further management per EP for VT.   Left message for wife [phone].  Unable to find her in the [facility] waiting room.   Echo 10/24/2020 1. There is no left ventricular thrombus. Left ventricular ejection  fraction, by estimation, is 20 to 25%. The left ventricle has severely  decreased function. The left ventricle demonstrates regional wall motion  abnormalities (see scoring  diagram/findings for description). The left ventricular internal cavity  size was moderately to severely dilated. Left ventricular diastolic  parameters are consistent with Grade II diastolic dysfunction  (pseudonormalization). Elevated left atrial  pressure.  There is mild apical dyskinesis. There is akinesis of the  mid-apical anterior wall and septum and the inferior apex.   2. Right ventricular systolic function is mildly reduced. The right  ventricular size is normal. There is normal pulmonary artery systolic  pressure.   3. Left atrial size was severely dilated.   4. There is mitral leaflet malcoaptation due to adverse chamber  remodeling. Effective regurgitant orifice area 0.22 cm, regurgitant  volume 43 ml, regurgitant fraction 55%. The mitral valve is grossly  normal. Moderate to severe mitral valve  regurgitation. No evidence of mitral stenosis.   5. The aortic valve is tricuspid. Aortic valve regurgitation is mild.  Mild aortic valve sclerosis is present, with no evidence of aortic valve  stenosis.   6. The inferior vena cava is normal in size with greater than 50%  respiratory variability, suggesting right atrial pressure of 3 mmHg.  _____________   History of Present Illness     LOVETT COFFIN is a 74 y.o. male with 73 year old grafts who had an ICD placed years ago presenting now with 2 ICD shocks with confirmation of at least 6-8 additional episodes of sustained VT treated with ATP.  Major symptom was lightheadedness and weakness along with extreme fatigue.  The only additional concerning issues has been that he has been noting more exertional dyspnea and fatigue over the last month or 2.  This was thought to potentially be related to having a URI, however he did have an echocardiogram and a Myoview ordered that have not yet been done.   He presents this morning after having 2 episodes of ICD shocks.   He has not  had any anginal symptoms to speak of.  He is graft dependent with LIMA LAD, SVG-OM and SVG-RCA.  Besides RV marginal branch and small OM1, all other native arteries are occluded.   He has also not had any prior ICD shocks until this morning.  Given his recent worsening fatigue, dyspnea and cough, cannot exclude  exacerbation of CHF.   He would definitely need an echocardiogram checked while in house, but at this point I think the more urgent pressing issue is to exclude an ischemic etiology for VT. -->  He has been posted for cardiac catheterization with coronary and graft angiography and possible PCI.  Started on amiodarone for intermittent benefit.   If no new ischemic findings on cardiac catheterization, would then discuss with EP colleagues to determine next appropriate step  Hospital Course     Consultants: Electrophysiology service  Due to ICD discharge, ischemic work-up was warranted.  Patient was taken to the Cath Lab on 10/23/2020 which revealed patent LIMA to LAD with occluded native ostial LAD, patent SVG to RCA with occluded native RCA, patent jump graft SVG to OM 2-3 with 100% native mid left circumflex occlusion, 50% OM1 lesion, LVEDP 20 mmHg, no culprit lesion was identified.  Patient was referred to EP service for further evaluation.  Patient was seen by Dr. Curt Bears in the morning of 10/23/2020, VT episode is further suppressed by IV amiodarone.  Due to sign of volume overload, he also underwent IV diuresis.  His losartan was switched to [facility].  Echocardiogram obtained on 10/24/2020 showed EF 20 to 25%, mild apical dyskinesis, akinesis of the mid apical anterior wall and septum and inferior apex, severe LAE, mitral leaflet malcoaptation due to adverse chamber remodeling with moderate to severe MR, mild AI.  Patient was seen in the morning of 10/25/2020 at which time he was doing well without further episodes of VT.  It was recommended to consider addition of Aldactone and Farxiga and follow-up if blood pressure remains stable.  He Siniyah Evangelist also be discharged on oral amiodarone with instruction to continue 400 mg twice a day for 2 weeks followed by 400 mg daily for 2 weeks followed by 200 mg daily thereafter.  Outpatient follow-up in the EP clinic with Dr. Lovena Le in 1 to 2 months Amaury Kuzel also need  to be arranged.  I have sent a message to our scheduler to contact the patient to arrange follow-up.   Did the patient have an acute coronary syndrome (MI, NSTEMI, STEMI, etc) this admission?:  No                               Did the patient have a percutaneous coronary intervention (stent / angioplasty)?:  No.       _____________  Discharge Vitals Blood pressure 119/75, pulse 67, temperature 97.9 F (36.6 C), temperature source Oral, resp. rate 18, height 6' (1.829 m), weight 79.2 kg, SpO2 94 %.  Filed Weights   10/23/20 1101 10/25/20 0616  Weight: 83 kg 79.2 kg    Labs & Radiologic Studies    CBC Recent Labs    10/23/20 1044 10/23/20 1126 10/23/20 1717 10/24/20 0400  WBC 6.6   < > 5.7 6.7  NEUTROABS 5.1  --   --   --   HGB 11.7*   < > 12.4* 11.1*  HCT 36.5*   < > 39.7 35.8*  MCV 92.6   < > 89.8 89.7  PLT 226   < >  234 217   < > = values in this interval not displayed.   Basic Metabolic Panel Recent Labs    10/23/20 1044 10/23/20 1044 10/23/20 1126 10/23/20 1126 10/23/20 1717 10/24/20 0400  NA 140   < > 144  --   --  140  K 3.5   < > 3.5  --   --  3.8  CL 108   < > 106  --   --  105  CO2 22  --   --   --   --  24  GLUCOSE 114*   < > 110*  --   --  105*  BUN 16   < > 20  --   --  19  CREATININE 1.27*   < > 1.10   < > 1.14 1.32*  CALCIUM 8.4*  --   --   --   --  8.7*  MG  --   --   --   --  1.9 1.9   < > = values in this interval not displayed.   Liver Function Tests No results for input(s): AST, ALT, ALKPHOS, BILITOT, PROT, ALBUMIN in the last 72 hours. No results for input(s): LIPASE, AMYLASE in the last 72 hours. High Sensitivity Troponin:   Recent Labs  Lab 10/23/20 1044 10/23/20 1717  TROPONINIHS 55* 84*    BNP Invalid input(s): POCBNP D-Dimer No results for input(s): DDIMER in the last 72 hours. Hemoglobin A1C No results for input(s): HGBA1C in the last 72 hours. Fasting Lipid Panel Recent Labs    10/24/20 0400  CHOL 134  HDL 39*   LDLCALC 81  TRIG 68  CHOLHDL 3.4   Thyroid Function Tests Recent Labs    10/23/20 1717  TSH 1.858   _____________  DG Chest 2 View  Result Date: 10/20/2020 CLINICAL DATA:  74 year old male with a history of shortness of breath EXAM: CHEST - 2 VIEW COMPARISON:  05/04/2010 FINDINGS: Cardiomediastinal silhouette unchanged. Surgical changes of median sternotomy and CABG. Unchanged cardiac pacing device/AICD from the left subclavian approach. Interval replacement of the generator. No pneumothorax. No confluent airspace disease. Stigmata of emphysema, with increased retrosternal airspace, flattened hemidiaphragms, increased AP diameter, and hyperinflation on the AP view. Blunting of the costophrenic angles with meniscus on the lateral view. No interlobular septal thickening. No displaced fracture.  Degenerative changes of the spine. IMPRESSION: Emphysema with blunting at the costophrenic angles, potentially scarring or trace pleural effusion. No evidence of overt edema or lobar pneumonia. Surgical changes of median sternotomy and CABG, as well as AICD. Electronically Signed   By: Corrie Mckusick D.O.   On: 10/20/2020 15:46   CARDIAC CATHETERIZATION  Addendum Date: 10/23/2020    Ost LAD to Prox LAD lesion is 100% stenosed. Patent LIMA to LAD.  Ost RCA to Prox RCA lesion is 100% stenosed. Patent SVG to RCA.  Mid Cx lesion is 100% stenosed. Patent jump SVG to OM2/OM3  1st Mrg lesion is 50% stenosed. This is a small vessel, unchanged from prior cath.  LV end diastolic pressure is mildly elevated. LVEDP 20 mm Hg.  There is no aortic valve stenosis.  Tortuous left subclavian. Versacore wire used to navigate.  Continue medical therapy for systolic heart failure.  Further management per EP for VT. Left message for wife [phone].  Unable to find her in the [facility] waiting room.   Result Date: 10/23/2020  Ost LAD to Prox LAD lesion is 100% stenosed. Patent LIMA to LAD.  Ost RCA  to Prox RCA lesion is 100%  stenosed. Patent SVG to RCA.  Mid Cx lesion is 100% stenosed. Patent jump SVG to OM2/OM3  1st Mrg lesion is 50% stenosed. This is a small vessel, unchanged from prior cath.  LV end diastolic pressure is mildly elevated. LVEDP 20 mm Hg.  There is no aortic valve stenosis.  Tortuous left subclavian. Versacore wire used to navigate.  Continue medical therapy for systolic heart failure.  Further management per EP for VT.   DG Chest Portable 1 View  Result Date: 10/23/2020 CLINICAL DATA:  AICD. EXAM: PORTABLE CHEST 1 VIEW COMPARISON:  CT 12/21/2015.  Chest x-ray 05/04/2010. FINDINGS: AICD in stable position. Prior CABG. Stable cardiomegaly. No focal infiltrate. Mild left base subsegmental atelectasis. No pleural effusion or pneumothorax. Right costophrenic angle incompletely imaged. Degenerative change thoracic spine. IMPRESSION: 1. AICD in stable position. Prior CABG. Stable cardiomegaly. 2. Mild left base subsegmental atelectasis. Electronically Signed   By: Marcello Moores  Register   On: 10/23/2020 11:22   ECHOCARDIOGRAM COMPLETE  Result Date: 10/24/2020    ECHOCARDIOGRAM REPORT   Patient Name:   OLIVE MOTYKA Date of Exam: 10/24/2020 Medical Rec #:  [id number]          Height:       72.0 in Accession #:    [id number]         Weight:       183.0 lb Date of Birth:  11-15-1946         BSA:          2.052 m Patient Age:    54 years           BP:           134/87 mmHg Patient Gender: M                  HR:           62 bpm. Exam Location:  Inpatient Procedure: 2D Echo, Cardiac Doppler and Color Doppler Indications:    Abnormal ECG  History:        Patient has prior history of Echocardiogram examinations, most                 recent 08/18/2015. CAD and Previous Myocardial Infarction,                 Defibrillator; Risk Factors:Dyslipidemia. ETOH abuse.                 Ventricular tachycardia.  Sonographer:    Clayton Lefort RDCS (AE) Referring Phys: [place] Comments: Suboptimal subcostal  window. IMPRESSIONS  1. There is no left ventricular thrombus. Left ventricular ejection fraction, by estimation, is 20 to 25%. The left ventricle has severely decreased function. The left ventricle demonstrates regional wall motion abnormalities (see scoring diagram/findings for description). The left ventricular internal cavity size was moderately to severely dilated. Left ventricular diastolic parameters are consistent with Grade II diastolic dysfunction (pseudonormalization). Elevated left atrial pressure. There is mild apical dyskinesis. There is akinesis of the mid-apical anterior wall and septum and the inferior apex.  2. Right ventricular systolic function is mildly reduced. The right ventricular size is normal. There is normal pulmonary artery systolic pressure.  3. Left atrial size was severely dilated.  4. There is mitral leaflet malcoaptation due to adverse chamber remodeling. Effective regurgitant orifice area 0.22 cm, regurgitant volume 43 ml, regurgitant fraction 55%. The mitral valve is grossly normal. Moderate to severe mitral  valve regurgitation. No evidence of mitral stenosis.  5. The aortic valve is tricuspid. Aortic valve regurgitation is mild. Mild aortic valve sclerosis is present, with no evidence of aortic valve stenosis.  6. The inferior vena cava is normal in size with greater than 50% respiratory variability, suggesting right atrial pressure of 3 mmHg. FINDINGS  Left Ventricle: There is no left ventricular thrombus. Left ventricular ejection fraction, by estimation, is 20 to 25%. The left ventricle has severely decreased function. The left ventricle demonstrates regional wall motion abnormalities. The left ventricular internal cavity size was moderately to severely dilated. There is no left ventricular hypertrophy. Left ventricular diastolic parameters are consistent with Grade II diastolic dysfunction (pseudonormalization). Elevated left atrial pressure.  LV Wall Scoring: The apical  lateral segment, apical septal segment, and apex are dyskinetic. The mid and distal anterior wall and apical inferior segment are akinetic. There is mild apical dyskinesis. There is akinesis of the mid-apical anterior wall and septum and the inferior apex. Right Ventricle: The right ventricular size is normal. No increase in right ventricular wall thickness. Right ventricular systolic function is mildly reduced. There is normal pulmonary artery systolic pressure. The tricuspid regurgitant velocity is 2.86 m/s, and with an assumed right atrial pressure of 3 mmHg, the estimated right ventricular systolic pressure is 20.2 mmHg. Left Atrium: Left atrial size was severely dilated. Right Atrium: Right atrial size was normal in size. Pericardium: There is no evidence of pericardial effusion. Mitral Valve: There is mitral leaflet malcoaptation due to adverse chamber remodeling. Effective regurgitant orifice area 0.22 cm, regurgitant volume 43 ml, regurgitant fraction 55%. The mitral valve is grossly normal. Moderate to severe mitral valve regurgitation, with eccentric posteriorly directed jet. No evidence of mitral valve stenosis. Pulmonary venous flow is blunted (decreased). Tricuspid Valve: The tricuspid valve is normal in structure. Tricuspid valve regurgitation is mild. Aortic Valve: The aortic valve is tricuspid. Aortic valve regurgitation is mild. Aortic regurgitation PHT measures 486 msec. Mild aortic valve sclerosis is present, with no evidence of aortic valve stenosis. Aortic valve mean gradient measures 3.0 mmHg. Aortic valve peak gradient measures 5.0 mmHg. Aortic valve area, by VTI measures 1.64 cm. Pulmonic Valve: The pulmonic valve was normal in structure. Pulmonic valve regurgitation is not visualized. Aorta: The aortic root and ascending aorta are structurally normal, with no evidence of dilitation. Venous: The inferior vena cava is normal in size with greater than 50% respiratory variability, suggesting  right atrial pressure of 3 mmHg. IAS/Shunts: No atrial level shunt detected by color flow Doppler. Additional Comments: A pacer wire is visualized.  LEFT VENTRICLE PLAX 2D LVIDd:         6.80 cm LVIDs:         6.00 cm LV PW:         1.30 cm LV IVS:        1.00 cm LVOT diam:     2.00 cm LV SV:         35 LV SV Index:   17 LVOT Area:     3.14 cm  RIGHT VENTRICLE            IVC RV Basal diam:  3.90 cm    IVC diam: 1.60 cm RV Mid diam:    3.40 cm RV S prime:     6.94 cm/s TAPSE (M-mode): 1.8 cm LEFT ATRIUM              Index       RIGHT ATRIUM  Index LA diam:        4.80 cm  2.34 cm/m  RA Area:     17.40 cm LA Vol (A2C):   168.0 ml 81.89 ml/m RA Volume:   48.60 ml  23.69 ml/m LA Vol (A4C):   137.0 ml 66.78 ml/m LA Biplane Vol: 154.0 ml 75.06 ml/m  AORTIC VALVE AV Area (Vmax):    1.86 cm AV Area (Vmean):   1.76 cm AV Area (VTI):     1.64 cm AV Vmax:           112.00 cm/s AV Vmean:          83.800 cm/s AV VTI:            0.211 m AV Peak Grad:      5.0 mmHg AV Mean Grad:      3.0 mmHg LVOT Vmax:         66.20 cm/s LVOT Vmean:        47.000 cm/s LVOT VTI:          0.110 m LVOT/AV VTI ratio: 0.52 AI PHT:            486 msec  AORTA Ao Root diam: 3.60 cm Ao Asc diam:  3.60 cm MR Peak grad:    114.5 mmHg  TRICUSPID VALVE MR Mean grad:    69.0 mmHg   TR Peak grad:   32.7 mmHg MR Vmax:         535.00 cm/s TR Vmax:        286.00 cm/s MR Vmean:        384.0 cm/s MR PISA:         3.08 cm    SHUNTS MR PISA Eff ROA: 22 mm      Systemic VTI:  0.11 m MR PISA Radius:  0.70 cm     Systemic Diam: 2.00 cm Dani Gobble Croitoru MD Electronically signed by Sanda Klein MD Signature Date/Time: 10/24/2020/4:51:40 PM    Final    CUP PACEART REMOTE DEVICE CHECK  Result Date: 10/15/2020 Scheduled remote reviewed. Normal device function.  There were two atrial arrhythmias detected that both were less than one minute Next remote in 91 days. Kathy Breach, RN, CCDS, [employer]  Disposition   Pt is being discharged  home today in good condition.  Follow-up Plans & Appointments     Follow-up Information     Liliane Shi, PA-C Follow up on 11/24/2020.   Specialties: Cardiology, Physician Assistant Why: 11:45AM. Cardiology follow up Contact information: [address] [place] [address] [phone]         Evans Lance, MD Follow up.   Specialty: Cardiology Why: scheduler Aldeen Riga call you to set up appt with Dr Lovena Le, please call us if you do not hear from our scheduler in 3 business days.  Contact information: [address] [address] [phone]         [place] [facility] Follow up on 10/30/2020.   Specialty: Cardiology Why: scheduler Natividad Halls call you to set up time for 5-7 day BMET lab work Contact information: [address] [place] [phone]               Discharge Instructions     Diet - low sodium heart healthy   Complete by: As directed    Increase activity slowly   Complete by: As directed  Discharge Medications   Allergies as of 10/25/2020       Reactions   Other Anaphylaxis   Insect bites (Has EpiPen)        Medication List     STOP taking these medications    losartan 50 MG tablet Commonly known as: COZAAR       TAKE these medications    amiodarone 400 MG tablet Commonly known as: PACERONE 400 mg twice a day for 2 weeks, then 400 mg daily for 2 weeks, then 200mg  daily thereafter   aspirin 81 MG EC tablet Take 81 mg by mouth daily.   atorvastatin 80 MG tablet Commonly known as: LIPITOR TAKE 1 TABLET(80 MG) BY MOUTH DAILY AT 6 PM What changed:  how much to take how to take this when to take this additional instructions   buPROPion 300 MG 24 hr tablet Commonly known as: WELLBUTRIN XL Take 300 mg by mouth daily.   carvedilol 12.5 MG tablet Commonly known as: COREG TAKE 1 TABLET(12.5 MG) BY MOUTH TWICE  DAILY What changed:  how much to take how to take this when to take this additional instructions   EPINEPHrine 0.3 mg/0.3 mL Soaj injection Commonly known as: EPI-PEN Inject 0.3 mLs (0.3 mg total) into the muscle as needed (anaphylaxis).   escitalopram 20 MG tablet Commonly known as: LEXAPRO Take 20 mg by mouth daily.   NexIUM 20 MG packet Generic drug: esomeprazole Take 20 mg by mouth daily before breakfast.   nitroGLYCERIN 0.4 MG SL tablet Commonly known as: NITROSTAT Place 1 tablet (0.4 mg total) under the tongue every 5 (five) minutes as needed for chest pain.   Ocuvite PreserVision Tabs Take 1 tablet by mouth daily.   sacubitril-valsartan 24-26 MG Commonly known as: ENTRESTO Take 1 tablet by mouth 2 (two) times daily. With 30 day coupon   zolpidem 10 MG tablet Commonly known as: AMBIEN Take 1 tablet by mouth at bedtime as needed for sleep.           Outstanding Labs/Studies   BMET in 1 week  Duration of Discharge Encounter   Greater than 30 minutes including physician time.  Hilbert Corrigan, PA 10/25/2020, 10:11 AM    I have seen and examined this patient with Almyra Deforest.  Agree with above, note added to reflect my findings.  On exam, RRR, no murmurs.  Patient mated to the hospital with ICD shocks.  He has a history of heart failure due to ischemic cardiomyopathy and has had ATP in the past.  Left heart catheterization showed stable coronary disease.  Echo showed no acute abnormality.  He was started on amiodarone.  His losartan was stopped and he was started on Entresto.  He Falesha Schommer follow up with cardiology as an outpatient.  Malyia Moro M. Caitlain Tweed MD 10/25/2020 10:41 AM

## 2020-10-25 NOTE — Plan of Care (Signed)
  Problem: Education: Goal: Knowledge of General Education information will improve Description: Including pain rating scale, medication(s)/side effects and non-pharmacologic comfort measures Outcome: Progressing   Problem: Health Behavior/Discharge Planning: Goal: Ability to manage health-related needs will improve Outcome: Progressing   Problem: Clinical Measurements: Goal: Ability to maintain clinical measurements within normal limits will improve Outcome: Progressing Goal: Will remain free from infection Outcome: Progressing   Problem: Nutrition: Goal: Adequate nutrition will be maintained Outcome: Progressing   Problem: Pain Managment: Goal: General experience of comfort will improve Outcome: Progressing

## 2020-10-25 NOTE — Progress Notes (Addendum)
Received call from [place] regarding possible ST elevation. Pt assessed & asymptomatic. EKG placed in chart

## 2020-10-26 ENCOUNTER — Encounter (HOSPITAL_COMMUNITY): Payer: Self-pay | Admitting: Interventional Cardiology

## 2020-10-27 ENCOUNTER — Other Ambulatory Visit: Payer: Self-pay | Admitting: Physician Assistant

## 2020-10-27 ENCOUNTER — Other Ambulatory Visit: Payer: Medicare Other

## 2020-10-27 ENCOUNTER — Other Ambulatory Visit: Payer: Self-pay

## 2020-10-27 DIAGNOSIS — I5022 Chronic systolic (congestive) heart failure: Secondary | ICD-10-CM | POA: Diagnosis not present

## 2020-10-29 LAB — BASIC METABOLIC PANEL
BUN/Creatinine Ratio: 14 (ref 10–24)
BUN: 20 mg/dL (ref 8–27)
CO2: 22 mmol/L (ref 20–29)
Calcium: 9 mg/dL (ref 8.6–10.2)
Chloride: 104 mmol/L (ref 96–106)
Creatinine, Ser: 1.38 mg/dL — ABNORMAL HIGH (ref 0.76–1.27)
GFR calc Af Amer: 59 mL/min/{1.73_m2} — ABNORMAL LOW (ref >59)
GFR calc non Af Amer: 51 mL/min/{1.73_m2} — ABNORMAL LOW (ref >59)
Glucose: 110 mg/dL — ABNORMAL HIGH (ref 65–99)
Potassium: 3.7 mmol/L (ref 3.5–5.2)
Sodium: 140 mmol/L (ref 134–144)

## 2020-11-03 DIAGNOSIS — F4312 Post-traumatic stress disorder, chronic: Secondary | ICD-10-CM | POA: Diagnosis not present

## 2020-11-03 DIAGNOSIS — F900 Attention-deficit hyperactivity disorder, predominantly inattentive type: Secondary | ICD-10-CM | POA: Diagnosis not present

## 2020-11-03 DIAGNOSIS — F422 Mixed obsessional thoughts and acts: Secondary | ICD-10-CM | POA: Diagnosis not present

## 2020-11-03 DIAGNOSIS — F411 Generalized anxiety disorder: Secondary | ICD-10-CM | POA: Diagnosis not present

## 2020-11-12 ENCOUNTER — Telehealth: Payer: Self-pay | Admitting: Cardiovascular Disease

## 2020-11-12 ENCOUNTER — Encounter: Payer: Self-pay | Admitting: Internal Medicine

## 2020-11-12 ENCOUNTER — Telehealth: Payer: Self-pay | Admitting: Internal Medicine

## 2020-11-12 NOTE — Telephone Encounter (Signed)
Spoke with Dr. Nathanial Millman CMA/RN regarding situation. She will contact pt and MD to follow up.

## 2020-11-12 NOTE — Telephone Encounter (Signed)
[name] called   Patient is having possible pneumonia symptoms for 4 days. She is suggesting him to be seen today and have some diagnostics done.   Please give her a call back at [phone]

## 2020-11-12 NOTE — Telephone Encounter (Signed)
Pts wife has agreed to have him COVID tested today. I personally contacted and spoke with his PCP's RN early today regarding the urgency. The patient was not contacted back by the office. I advised his wife to find an urgent care who can evaluate him and perform a COVID test as well. She was instructed to bring his current medication list, especially if the urgent care is out of the [facility] system.

## 2020-11-12 NOTE — Telephone Encounter (Signed)
Wife called to check on the patient based on the mychart message that was sent " [name] has been home from the hospital since 11-28.  For the last three days he has been running a temp from 99.9 to 101 and is coughing a lot.  At first it was dry but now is coughing up white phlem.  The nurse I spoke with suspects pneumonia but needs to talk to you.  [name] needs help as soon as possible, he is weak and the coughing is causing his back and head to hurt.  While in the hospital he had two negative COVID tests and since being home has only gone to [employer] with a mask and social distanced.  We are both vaccinated.  Memory Argue Randol Kern 07-Oct-2046.  Our home phone is [phone] " Wife encouraged to take pt to ED, but states that he is doing better now & is able to make appt for tomorrow.

## 2020-11-12 NOTE — Telephone Encounter (Signed)
Spoke with pt's wife. She states he has been running a temp between 99 and 101 for the past 3 days, has productive cough, SOB, shallow breaths, and back pain. Pt's wife states he has had 2 COVID vaccines, has not had his booster. Last vaccine was June 2021. She denies any close encounters with covid + persons since being discharged from the hospital. He has "mainly been at home and once went to [employer] with a mask on."   I contacted pt's PCP office, Dr. Sharlet Salina. Currently awaiting a call back. With temp, cough, back pain, and SOB he may warrant a sick visit.   Will call pt's wife back after return call from Dr. Nathanial Millman office.

## 2020-11-12 NOTE — Telephone Encounter (Signed)
  Spoke with pt's wife. She states he has been running a temp between 99 and 101 for the past 3 days, has productive cough, SOB, shallow breaths, and back pain. Pt's wife states he has had 2 COVID vaccines, has not had his booster. Last vaccine was June 2021. She denies any close encounters with covid + persons since being discharged from the hospital. He has "mainly been at home and once went to [employer] with a mask on."   I contacted pt's PCP office, Dr. Sharlet Salina. Currently awaiting a call back. With temp, cough, back pain, and SOB he may warrant a sick visit.   Will call pt's wife back after return call from Dr. Nathanial Millman office.      Spoke to [employer] who repeated everything above from phone message that was sent directly to PCP & cardiologist.  RN states she did not want to send him to UC b/c he is amiodarone & she is concerned that he will be prescribed "any antibiotic".

## 2020-11-12 NOTE — Telephone Encounter (Signed)
He needs to be tested for covid. If he has covid, he needs to get set up for monoclonal antibodies.   I'm not sure where those are done now.   Does the triage team have any resources on this

## 2020-11-12 NOTE — Telephone Encounter (Signed)
Patient's wife Audry Pili calling on behalf of patient. She states he was in the hospital from Nov 25th-28th because of issues with his defibrillator. Starting about 3 days ago patient started running a fever between 99-101 degrees and a cough. Cough started dry but has turned into a wet cough and patient is coughing up white mucus. Patient's legs felt heavy yesterday, like he had 'walking up mountains'. They would like Dr. Elmarie Shiley advice on what they should do or if there is a prescription that should be called in.

## 2020-11-12 NOTE — Telephone Encounter (Signed)
I would recommend patient/wife to call us directly so they can be triaged. If no virtual openings at other offices I would have to recommend urgent care or ER today based on the note alone. The SOB in note is not quantified and could be very concerning. I am also not sure who Lorren is in the note, is this a family member or office staff at cardiology?

## 2020-11-12 NOTE — Telephone Encounter (Signed)
And he needs to be in contact with his primary MD

## 2020-11-13 ENCOUNTER — Telehealth (INDEPENDENT_AMBULATORY_CARE_PROVIDER_SITE_OTHER): Payer: Medicare Other | Admitting: Internal Medicine

## 2020-11-13 ENCOUNTER — Encounter: Payer: Self-pay | Admitting: Internal Medicine

## 2020-11-13 DIAGNOSIS — R059 Cough, unspecified: Secondary | ICD-10-CM | POA: Diagnosis not present

## 2020-11-13 DIAGNOSIS — R509 Fever, unspecified: Secondary | ICD-10-CM | POA: Diagnosis not present

## 2020-11-13 MED ORDER — DOXYCYCLINE HYCLATE 100 MG PO TABS: 100.0000 mg | ORAL_TABLET | Freq: Two times a day (BID) | ORAL | 0 refills | Status: DC

## 2020-11-13 MED ORDER — HYDROCODONE-HOMATROPINE 5-1.5 MG/5ML PO SYRP
5.0000 mL | ORAL_SOLUTION | Freq: Three times a day (TID) | ORAL | 0 refills | Status: DC | PRN
Start: 2020-11-13 — End: 2020-12-05

## 2020-11-13 NOTE — Progress Notes (Signed)
Virtual Visit via Audio Note  I connected with [name] on 11/13/20 at  9:40 AM EST by an audio-only enabled telemedicine application and verified that I am speaking with the correct person using two identifiers.  The patient and the provider were at separate locations throughout the entire encounter. Patient location: home, Provider location: work   I discussed the limitations of evaluation and management by telemedicine and the availability of in person appointments. The patient expressed understanding and agreed to proceed. The patient and the provider were the only parties present for the visit unless noted in HPI below.  History of Present Illness: The patient is a 74 y.o. man with visit for cough and fever. Left hospital 10/25/20 and started running fever this week 99-101.9. Took tylenol for that. The cough is bad and is getting worse. Coughing up white sputum. He is on entresto and amiodarone since this hospital stay. Sometimes feels breathing is shallow but denies SOB. Less activity this week. Has no SOB with lying down. Some weakness. Overall it is not improving and may be. Has tried dayquil. Has had 2 covid-19 shots but no booster. No covid-19 test this week but several in the hospital.   Observations/Objective: A and O times 3, no dyspnea during visit  Assessment and Plan: See problem oriented charting  Follow Up Instructions: covid-19 testing, rx hycodan and doxycycline  Visit time 12 minutes in non-face to face communication with patient and coordination of care.  I discussed the assessment and treatment plan with the patient. The patient was provided an opportunity to ask questions and all were answered. The patient agreed with the plan and demonstrated an understanding of the instructions.   The patient was advised to call back or seek an in-person evaluation if the symptoms worsen or if the condition fails to improve as anticipated.  Hoyt Koch, MD

## 2020-11-13 NOTE — Assessment & Plan Note (Signed)
Checking covid-19. Rx hycodan and doxycycline to cover CAP.

## 2020-11-13 NOTE — Progress Notes (Signed)
Pt's wife, Rickie, called to schedule Covid test for today at 11a.  Instructions for arrival given & pt verb understanding.

## 2020-11-13 NOTE — Assessment & Plan Note (Signed)
Checking covid-19 as would a good candidate for monoclonal antibody. Rx hycodan and doxycycline.

## 2020-11-14 LAB — NOVEL CORONAVIRUS, NAA: SARS-CoV-2, NAA: NOT DETECTED

## 2020-11-14 LAB — SARS-COV-2, NAA 2 DAY TAT

## 2020-11-15 ENCOUNTER — Encounter: Payer: Self-pay | Admitting: Internal Medicine

## 2020-11-16 NOTE — Telephone Encounter (Signed)
   Spouse is calling to report patient's cough is getting worse since last visit  Please call

## 2020-11-16 NOTE — Telephone Encounter (Signed)
Patient made an appointment for tomorrow

## 2020-11-17 ENCOUNTER — Inpatient Hospital Stay (HOSPITAL_COMMUNITY)
Admission: EM | Admit: 2020-11-17 | Discharge: 2020-12-05 | DRG: 286 | Disposition: A | Discharge status: Hospice | Payer: Medicare Other | Attending: Internal Medicine | Admitting: Internal Medicine

## 2020-11-17 ENCOUNTER — Encounter (HOSPITAL_COMMUNITY): Payer: Self-pay

## 2020-11-17 ENCOUNTER — Encounter: Payer: Self-pay | Admitting: Internal Medicine

## 2020-11-17 ENCOUNTER — Telehealth (INDEPENDENT_AMBULATORY_CARE_PROVIDER_SITE_OTHER): Payer: Medicare Other | Admitting: Internal Medicine

## 2020-11-17 ENCOUNTER — Other Ambulatory Visit: Payer: Self-pay

## 2020-11-17 ENCOUNTER — Other Ambulatory Visit (HOSPITAL_COMMUNITY): Payer: Medicare Other

## 2020-11-17 ENCOUNTER — Encounter (HOSPITAL_COMMUNITY): Payer: Medicare Other

## 2020-11-17 ENCOUNTER — Emergency Department (HOSPITAL_COMMUNITY): Payer: Medicare Other

## 2020-11-17 DIAGNOSIS — I130 Hypertensive heart and chronic kidney disease with heart failure and stage 1 through stage 4 chronic kidney disease, or unspecified chronic kidney disease: Secondary | ICD-10-CM | POA: Diagnosis not present

## 2020-11-17 DIAGNOSIS — J9811 Atelectasis: Secondary | ICD-10-CM | POA: Diagnosis not present

## 2020-11-17 DIAGNOSIS — Z86718 Personal history of other venous thrombosis and embolism: Secondary | ICD-10-CM

## 2020-11-17 DIAGNOSIS — N170 Acute kidney failure with tubular necrosis: Secondary | ICD-10-CM | POA: Diagnosis not present

## 2020-11-17 DIAGNOSIS — W19XXXA Unspecified fall, initial encounter: Secondary | ICD-10-CM | POA: Diagnosis not present

## 2020-11-17 DIAGNOSIS — I10 Essential (primary) hypertension: Secondary | ICD-10-CM | POA: Diagnosis not present

## 2020-11-17 DIAGNOSIS — Z01810 Encounter for preprocedural cardiovascular examination: Secondary | ICD-10-CM | POA: Diagnosis not present

## 2020-11-17 DIAGNOSIS — E8809 Other disorders of plasma-protein metabolism, not elsewhere classified: Secondary | ICD-10-CM | POA: Diagnosis present

## 2020-11-17 DIAGNOSIS — J320 Chronic maxillary sinusitis: Secondary | ICD-10-CM | POA: Diagnosis not present

## 2020-11-17 DIAGNOSIS — F419 Anxiety disorder, unspecified: Secondary | ICD-10-CM | POA: Diagnosis present

## 2020-11-17 DIAGNOSIS — I2510 Atherosclerotic heart disease of native coronary artery without angina pectoris: Secondary | ICD-10-CM | POA: Diagnosis present

## 2020-11-17 DIAGNOSIS — I5043 Acute on chronic combined systolic (congestive) and diastolic (congestive) heart failure: Secondary | ICD-10-CM | POA: Diagnosis not present

## 2020-11-17 DIAGNOSIS — I255 Ischemic cardiomyopathy: Secondary | ICD-10-CM

## 2020-11-17 DIAGNOSIS — Z515 Encounter for palliative care: Secondary | ICD-10-CM | POA: Diagnosis not present

## 2020-11-17 DIAGNOSIS — Z95810 Presence of automatic (implantable) cardiac defibrillator: Secondary | ICD-10-CM | POA: Diagnosis not present

## 2020-11-17 DIAGNOSIS — Z20822 Contact with and (suspected) exposure to covid-19: Secondary | ICD-10-CM | POA: Diagnosis present

## 2020-11-17 DIAGNOSIS — I472 Ventricular tachycardia, unspecified: Secondary | ICD-10-CM

## 2020-11-17 DIAGNOSIS — J984 Other disorders of lung: Secondary | ICD-10-CM | POA: Diagnosis not present

## 2020-11-17 DIAGNOSIS — R570 Cardiogenic shock: Secondary | ICD-10-CM | POA: Diagnosis not present

## 2020-11-17 DIAGNOSIS — D631 Anemia in chronic kidney disease: Secondary | ICD-10-CM | POA: Diagnosis present

## 2020-11-17 DIAGNOSIS — Z820 Family history of epilepsy and other diseases of the nervous system: Secondary | ICD-10-CM

## 2020-11-17 DIAGNOSIS — I6782 Cerebral ischemia: Secondary | ICD-10-CM | POA: Diagnosis not present

## 2020-11-17 DIAGNOSIS — F32A Depression, unspecified: Secondary | ICD-10-CM | POA: Diagnosis present

## 2020-11-17 DIAGNOSIS — I513 Intracardiac thrombosis, not elsewhere classified: Secondary | ICD-10-CM | POA: Diagnosis present

## 2020-11-17 DIAGNOSIS — K219 Gastro-esophageal reflux disease without esophagitis: Secondary | ICD-10-CM | POA: Diagnosis present

## 2020-11-17 DIAGNOSIS — F0390 Unspecified dementia without behavioral disturbance: Secondary | ICD-10-CM | POA: Diagnosis present

## 2020-11-17 DIAGNOSIS — R059 Cough, unspecified: Secondary | ICD-10-CM | POA: Diagnosis not present

## 2020-11-17 DIAGNOSIS — Z66 Do not resuscitate: Secondary | ICD-10-CM | POA: Diagnosis not present

## 2020-11-17 DIAGNOSIS — Y92239 Unspecified place in hospital as the place of occurrence of the external cause: Secondary | ICD-10-CM | POA: Diagnosis not present

## 2020-11-17 DIAGNOSIS — N1831 Chronic kidney disease, stage 3a: Secondary | ICD-10-CM | POA: Diagnosis not present

## 2020-11-17 DIAGNOSIS — R0600 Dyspnea, unspecified: Secondary | ICD-10-CM

## 2020-11-17 DIAGNOSIS — G319 Degenerative disease of nervous system, unspecified: Secondary | ICD-10-CM | POA: Diagnosis not present

## 2020-11-17 DIAGNOSIS — N189 Chronic kidney disease, unspecified: Secondary | ICD-10-CM

## 2020-11-17 DIAGNOSIS — Z951 Presence of aortocoronary bypass graft: Secondary | ICD-10-CM | POA: Diagnosis not present

## 2020-11-17 DIAGNOSIS — I517 Cardiomegaly: Secondary | ICD-10-CM | POA: Diagnosis not present

## 2020-11-17 DIAGNOSIS — E785 Hyperlipidemia, unspecified: Secondary | ICD-10-CM | POA: Diagnosis present

## 2020-11-17 DIAGNOSIS — R413 Other amnesia: Secondary | ICD-10-CM | POA: Diagnosis not present

## 2020-11-17 DIAGNOSIS — J90 Pleural effusion, not elsewhere classified: Secondary | ICD-10-CM | POA: Diagnosis not present

## 2020-11-17 DIAGNOSIS — I272 Pulmonary hypertension, unspecified: Secondary | ICD-10-CM | POA: Diagnosis present

## 2020-11-17 DIAGNOSIS — E876 Hypokalemia: Secondary | ICD-10-CM | POA: Diagnosis not present

## 2020-11-17 DIAGNOSIS — I248 Other forms of acute ischemic heart disease: Secondary | ICD-10-CM | POA: Diagnosis not present

## 2020-11-17 DIAGNOSIS — R0602 Shortness of breath: Secondary | ICD-10-CM | POA: Diagnosis not present

## 2020-11-17 DIAGNOSIS — I779 Disorder of arteries and arterioles, unspecified: Secondary | ICD-10-CM | POA: Diagnosis present

## 2020-11-17 DIAGNOSIS — I252 Old myocardial infarction: Secondary | ICD-10-CM

## 2020-11-17 DIAGNOSIS — Z83438 Family history of other disorder of lipoprotein metabolism and other lipidemia: Secondary | ICD-10-CM

## 2020-11-17 DIAGNOSIS — Z8546 Personal history of malignant neoplasm of prostate: Secondary | ICD-10-CM

## 2020-11-17 DIAGNOSIS — R5381 Other malaise: Secondary | ICD-10-CM | POA: Diagnosis present

## 2020-11-17 DIAGNOSIS — Z803 Family history of malignant neoplasm of breast: Secondary | ICD-10-CM

## 2020-11-17 DIAGNOSIS — I509 Heart failure, unspecified: Secondary | ICD-10-CM

## 2020-11-17 DIAGNOSIS — I6522 Occlusion and stenosis of left carotid artery: Secondary | ICD-10-CM | POA: Diagnosis present

## 2020-11-17 DIAGNOSIS — Z7189 Other specified counseling: Secondary | ICD-10-CM | POA: Diagnosis not present

## 2020-11-17 DIAGNOSIS — Z7982 Long term (current) use of aspirin: Secondary | ICD-10-CM

## 2020-11-17 DIAGNOSIS — N179 Acute kidney failure, unspecified: Secondary | ICD-10-CM | POA: Diagnosis not present

## 2020-11-17 DIAGNOSIS — I5021 Acute systolic (congestive) heart failure: Secondary | ICD-10-CM | POA: Diagnosis not present

## 2020-11-17 DIAGNOSIS — Z85818 Personal history of malignant neoplasm of other sites of lip, oral cavity, and pharynx: Secondary | ICD-10-CM

## 2020-11-17 DIAGNOSIS — R251 Tremor, unspecified: Secondary | ICD-10-CM | POA: Diagnosis present

## 2020-11-17 DIAGNOSIS — Z8249 Family history of ischemic heart disease and other diseases of the circulatory system: Secondary | ICD-10-CM

## 2020-11-17 DIAGNOSIS — Z79899 Other long term (current) drug therapy: Secondary | ICD-10-CM

## 2020-11-17 DIAGNOSIS — I5023 Acute on chronic systolic (congestive) heart failure: Secondary | ICD-10-CM | POA: Diagnosis not present

## 2020-11-17 LAB — BASIC METABOLIC PANEL
Anion gap: 11 (ref 5–15)
BUN: 27 mg/dL — ABNORMAL HIGH (ref 8–23)
CO2: 22 mmol/L (ref 22–32)
Calcium: 8.5 mg/dL — ABNORMAL LOW (ref 8.9–10.3)
Chloride: 107 mmol/L (ref 98–111)
Creatinine, Ser: 1.72 mg/dL — ABNORMAL HIGH (ref 0.61–1.24)
GFR, Estimated: 41 mL/min — ABNORMAL LOW (ref >60)
Glucose, Bld: 106 mg/dL — ABNORMAL HIGH (ref 70–99)
Potassium: 3.7 mmol/L (ref 3.5–5.1)
Sodium: 140 mmol/L (ref 135–145)

## 2020-11-17 LAB — RESP PANEL BY RT-PCR (FLU A&B, COVID) ARPGX2
Influenza A by PCR: NEGATIVE
Influenza B by PCR: NEGATIVE
SARS Coronavirus 2 by RT PCR: NEGATIVE

## 2020-11-17 LAB — CBC
HCT: 33.9 % — ABNORMAL LOW (ref 39.0–52.0)
Hemoglobin: 10.3 g/dL — ABNORMAL LOW (ref 13.0–17.0)
MCH: 26.9 pg (ref 26.0–34.0)
MCHC: 30.4 g/dL (ref 30.0–36.0)
MCV: 88.5 fL (ref 80.0–100.0)
Platelets: 186 10*3/uL (ref 150–400)
RBC: 3.83 MIL/uL — ABNORMAL LOW (ref 4.22–5.81)
RDW: 15 % (ref 11.5–15.5)
WBC: 7 10*3/uL (ref 4.0–10.5)
nRBC: 0 % (ref 0.0–0.2)

## 2020-11-17 LAB — TROPONIN I (HIGH SENSITIVITY)
Troponin I (High Sensitivity): 29 ng/L — ABNORMAL HIGH (ref <18)
Troponin I (High Sensitivity): 29 ng/L — ABNORMAL HIGH (ref <18)

## 2020-11-17 LAB — BRAIN NATRIURETIC PEPTIDE: B Natriuretic Peptide: 3663.4 pg/mL — ABNORMAL HIGH (ref 0.0–100.0)

## 2020-11-17 MED ORDER — FUROSEMIDE 10 MG/ML IJ SOLN
20.0000 mg | Freq: Once | INTRAMUSCULAR | Status: AC
  Administered 2020-11-17: 20 mg via INTRAVENOUS
  Filled 2020-11-17: qty 2

## 2020-11-17 MED ORDER — ALBUTEROL SULFATE HFA 108 (90 BASE) MCG/ACT IN AERS: 2.0000 | INHALATION_SPRAY | RESPIRATORY_TRACT | Status: DC | PRN

## 2020-11-17 NOTE — Progress Notes (Signed)
Virtual Visit via Audio Note  I connected with [name] on 11/17/20 at  3:20 PM EST by an audio-only enabled telemedicine application and verified that I am speaking with the correct person using two identifiers.  The patient and the provider were at separate locations throughout the entire encounter. Patient location: home, Provider location: work   I discussed the limitations of evaluation and management by telemedicine and the availability of in person appointments. The patient expressed understanding and agreed to proceed. The patient and the provider were the only parties present for the visit unless noted in HPI below.  History of Present Illness: The patient is a 74 y.o. man with visit for ongoing cough and vomiting. Spoke to his wife today due to him feeling badly. She is watching him and he is struggling to breath just sitting there. Unable to move aroundTemp 98-81F. Has been falling several times. Having some new SOB since Friday. He did start taking the doxycycline. She is not sure if that has helped although his fevers have stopped. Covid-19 test done Friday negative. Has white sputum and still coughing often. Denies diarrhea. Is having nausea and vomited twice on Sunday and once Monday. Has not eaten much of anything. Some broth and ginger ale today. Overall it is worsening significantly.   Observations/Objective: A and O times 3  Assessment and Plan: See problem oriented charting  Follow Up Instructions: go to ER now for SOB  Visit time 6 minutes in non-face to face communication with patient and coordination of care.  I discussed the assessment and treatment plan with the patient. The patient was provided an opportunity to ask questions and all were answered. The patient agreed with the plan and demonstrated an understanding of the instructions.   The patient was advised to call back or seek an in-person evaluation if the symptoms worsen or if the condition fails to  improve as anticipated.  Peter Koch, MD

## 2020-11-17 NOTE — Assessment & Plan Note (Signed)
Sent to ER for worsening SOB and 4-5 days into doxycycline. Fevers are gone but SOB worsening. Lack of appetite and falling due to weakness with poor PO intake.

## 2020-11-17 NOTE — ED Triage Notes (Addendum)
Patient complains of ongoing SOB since being released from hospital after thanksgiving.patient reports that he was admitted for heart failure. denies weight gain, denies CP. Patient alert and oriented.

## 2020-11-17 NOTE — ED Provider Notes (Signed)
[facility] EMERGENCY DEPARTMENT Provider Note   CSN: [id number] Arrival date & time: 11/17/20  1627     History No chief complaint on file.   HINCKLEY MORENA is a 74 y.o. male.  HPI   74 year old male with past medical history of HTN, HLD, CAD status post MI and CABG with cardiomyopathy and ICD in place presents to the emergency department with shortness of breath and extreme fatigue.  Patient was admitted a couple weeks ago after his ICD fired.  He was noted to have runs of ventricular tachycardia.  He was admitted, medications were adjusted and he was discharged.  Over the last couple days patient has been feeling very short of breath, specifically with exertion.  He denies any chest pain or heaviness.  Denies any lower extremity swelling.  He admits over the past week that he has had productive cough, fever and episodes of vomiting/diarrhea.  He did a telemedicine with his primary doctor, 3 days ago he had a negative Covid swab.  He states he has been compliant with his medication adjustments.  He has not had his outpatient follow-up since his recent discharge.  Past Medical History:  Diagnosis Date  . Adult ADHD   . AICD (automatic cardioverter/defibrillator) present   . CAD (coronary artery disease)    Cath 2008, all grafts patent // Cath 2017 - all grafts patent   . 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 Carotid artery disease ([place])    Doppler, February, 2012, mild plaque, 0-39% bilateral, plan followup in one year  . Dyspepsia   . Ejection fraction < 50%   . Elevated PSA 02/15/2011  . Esophageal stricture   . GERD (gastroesophageal reflux disease)   . HTN (hypertension)   . Hx of CABG    1991  . Hyperlipidemia   . 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 Myocardial infarction ([place]) 1998  . Prostate cancer ([place]) 2012   IMRT, ADT  . Thrombosis of arm    Left  . Venous thrombosis    Left arm    Patient Active Problem List   Diagnosis Date Noted  . CHF exacerbation ([place]) 11/17/2020  . Fever 11/13/2020  . Cough 11/13/2020  . DOE (dyspnea on exertion) 10/23/2020  . URI (upper respiratory infection) 10/23/2020  . Medicare annual wellness visit, subsequent 05/19/2017  . Allergic rhinitis 05/05/2017  . Orthostatic hypotension 05/11/2016  . Elevated LFTs 03/24/2016  . Impaired fasting blood sugar 01/10/2016  . Chronic combined systolic and diastolic CHF (congestive heart failure) ([place]) 12/30/2015  . Epigastric pain 12/21/2015  . Atypical chest pain 12/21/2015  . ST elevation myocardial infarction (STEMI) ([place])   . Hx of CABG   . Adult ADHD   . Ejection fraction < 50%   . ICD (implantable cardioverter-defibrillator) in place 03/10/2015  . VT (ventricular tachycardia) ([place]) 03/05/2014  . Routine health maintenance 12/11/2013  . Advanced care planning/counseling discussion 12/10/2013  . Cardiomyopathy, ischemic   . Coronary artery disease, occlusive   . Essential hypertension   . Hyperlipidemia with target LDL less than 70   . Encounter for servicing of automatic implantable cardioverter-defibrillator (AICD) at end of battery life   . Carotid artery disease ([place])   . Prostate cancer ([place]) 11/09/2011  .  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 ESOPHAGEAL STRICTURE 08/23/2008    Past Surgical History:  Procedure Laterality Date  . CARDIAC CATHETERIZATION  12/05/06  . CARDIAC CATHETERIZATION N/A 12/21/2015   Procedure: Left Heart Cath and Coronary Angiography;  Surgeon: Lorretta Harp, MD;  Location: [facility];  Service: Cardiovascular;  Laterality: N/A;  . CORONARY ARTERY BYPASS GRAFT     LIMA_LAD, SVG-OM, Cx, RCA '91  . CYSTECTOMY     Hand  . HAND CONTRACTURE RELEASE     bilateral release of Dupyrten's  contracture: '10 and '11  . ICD Placement    . IMPLANTABLE CARDIOVERTER DEFIBRILLATOR (ICD) GENERATOR CHANGE  04/06/2015   Procedure: Icd Generator Change;  Surgeon: Evans Lance, MD;  Location: [facility];  Service: Cardiovascular;;  . LEFT HEART CATH AND CORS/GRAFTS ANGIOGRAPHY N/A 10/23/2020   Procedure: LEFT HEART CATH AND CORS/GRAFTS ANGIOGRAPHY;  Surgeon: Jettie Booze, MD;  Location: [facility];  Service: Cardiovascular;  Laterality: N/A;  . menistectomies, left knee  '09  . ROTATOR CUFF REPAIR  2005, 2007  . Thumb Surgery    . TONSILLECTOMY  2005   Malignant lesion, w/XRT       Family History  Problem Relation Age of Onset  . Alzheimer's disease Mother   . Dementia Mother        living in [place]  . Hyperlipidemia Father   . Hypertension Father   . CAD Father        had pacemaker  . Congestive Heart Failure Father   . Heart disease Brother        Bypass surgery  . Breast cancer Sister        survivor  . Colon cancer Neg Hx   . Prostate cancer Neg Hx   . Diabetes Neg Hx     Social History   Tobacco Use  . Smoking status: Never Smoker  . Smokeless tobacco: Never Used  Vaping Use  . Vaping Use: Never used  Substance Use Topics  . Alcohol use: No  . Drug use: No    Home Medications Prior to Admission medications   Medication Sig Start Date End Date Taking? Authorizing Provider  amiodarone (PACERONE) 400 MG tablet 400 mg twice a day for 2 weeks, then 400 mg daily for 2 weeks, then 200mg  daily thereafter Patient taking differently: Take 200-400 mg by mouth See admin instructions. Takes 1 tablet (400 mg totally) twice a day for 2 weeks; then takes 1 tablet (400 mg totally) daily for 2 weeks; then 200mg  daily thereafter 10/25/20  Yes Almyra Deforest, PA  aspirin 81 MG EC tablet Take 81 mg by mouth daily.   Yes [provider]  atorvastatin (LIPITOR) 80 MG tablet TAKE 1 TABLET(80 MG) BY MOUTH DAILY AT 6 PM Patient taking differently: Take 80 mg  by mouth every evening. 03/02/20  Yes Nahser, Wonda Cheng, MD  buPROPion (WELLBUTRIN XL) 300 MG 24 hr tablet Take 300 mg by mouth daily.   Yes [provider]  carvedilol (COREG) 12.5 MG tablet TAKE 1 TABLET(12.5 MG) BY MOUTH TWICE DAILY Patient taking differently: Take 12.5 mg by mouth in the morning and at bedtime. 03/11/20  Yes Nahser, Wonda Cheng, MD  doxycycline (VIBRA-TABS) 100 MG tablet Take 1 tablet (100 mg total) by mouth 2 (two) times daily. 11/13/20  Yes Hoyt Koch, MD  EPINEPHrine 0.3 mg/0.3 mL IJ SOAJ injection Inject 0.3 mLs (0.3 mg total) into the muscle as  needed (anaphylaxis). Patient taking differently: Inject 0.3 mg into the muscle as needed for anaphylaxis (call 911). 06/17/18  Yes Deno Etienne, DO  escitalopram (LEXAPRO) 20 MG tablet Take 20 mg by mouth daily.   Yes [provider]  esomeprazole (NEXIUM) 20 MG packet Take 20 mg by mouth daily before breakfast.   Yes [provider]  HYDROcodone-homatropine (HYCODAN) 5-1.5 MG/5ML syrup Take 5 mLs by mouth every 8 (eight) hours as needed for up to 5 days for cough. 11/13/20 11/18/20 Yes Hoyt Koch, MD  Multiple Vitamins-Minerals (OCUVITE PRESERVISION) TABS Take 1 tablet by mouth daily.   Yes [provider]  nitroGLYCERIN (NITROSTAT) 0.4 MG SL tablet Place 1 tablet (0.4 mg total) under the tongue every 5 (five) minutes as needed for chest pain. 12/23/15  Yes Bhagat, Bhavinkumar, PA  sacubitril-valsartan (ENTRESTO) 24-26 MG Take 1 tablet by mouth 2 (two) times daily. With 30 day coupon Patient taking differently: Take 1 tablet by mouth 2 (two) times daily. 10/25/20  Yes Almyra Deforest, PA  zolpidem (AMBIEN) 10 MG tablet Take 1 tablet by mouth at bedtime. 02/28/14  Yes [provider]    Allergies    Other  Review of Systems   Review of Systems  Constitutional: Negative for chills and fever.  HENT: Negative for congestion.   Eyes: Negative for visual disturbance.  Respiratory:  Positive for shortness of breath. Negative for cough and chest tightness.   Cardiovascular: Negative for chest pain and leg swelling.  Gastrointestinal: Negative for abdominal pain, diarrhea and vomiting.  Genitourinary: Negative for dysuria.  Skin: Negative for rash.  Neurological: Negative for headaches.    Physical Exam Updated Vital Signs BP 129/89   Pulse 65   Temp 97.6 F (36.4 C) (Oral)   Resp (!) 23   Ht 6' (1.829 m)   Wt 83.9 kg   SpO2 97%   BMI 25.09 kg/m   Physical Exam Vitals and nursing note reviewed.  Constitutional:      General: He is not in acute distress.    Appearance: Normal appearance. He is not toxic-appearing or diaphoretic.  HENT:     Head: Normocephalic.     Mouth/Throat:     Mouth: Mucous membranes are moist.  Cardiovascular:     Rate and Rhythm: Normal rate.  Pulmonary:     Effort: Pulmonary effort is normal. No respiratory distress.     Breath sounds: Rales present.  Abdominal:     Palpations: Abdomen is soft.     Tenderness: There is no abdominal tenderness.  Musculoskeletal:        General: No swelling.  Skin:    General: Skin is warm.  Neurological:     Mental Status: He is alert and oriented to person, place, and time. Mental status is at baseline.  Psychiatric:        Mood and Affect: Mood normal.     ED Results / Procedures / Treatments   Labs (all labs ordered are listed, but only abnormal results are displayed) Labs Reviewed  BASIC METABOLIC PANEL - Abnormal; Notable for the following components:      Result Value   Glucose, Bld 106 (*)    BUN 27 (*)    Creatinine, Ser 1.72 (*)    Calcium 8.5 (*)    GFR, Estimated 41 (*)    All other components within normal limits  CBC - Abnormal; Notable for the following components:   RBC 3.83 (*)    Hemoglobin 10.3 (*)  HCT 33.9 (*)    All other components within normal limits  BRAIN NATRIURETIC PEPTIDE - Abnormal; Notable for the following components:   B Natriuretic Peptide  3,663.4 (*)    All other components within normal limits  TROPONIN I (HIGH SENSITIVITY) - Abnormal; Notable for the following components:   Troponin I (High Sensitivity) 29 (*)    All other components within normal limits  TROPONIN I (HIGH SENSITIVITY) - Abnormal; Notable for the following components:   Troponin I (High Sensitivity) 29 (*)    All other components within normal limits  RESP PANEL BY RT-PCR (FLU A&B, COVID) [id number]    EKG EKG Interpretation  Date/Time:  Tuesday November 17 2020 21:44:32 EST Ventricular Rate:  65 PR Interval:    QRS Duration: 180 QT Interval:  475 QTC Calculation: 494 R Axis:   -56 Text Interpretation: Sinus rhythm RBBB and LAFB LVH with secondary repolarization abnormality NSR, RBBB unchanged Confirmed by Lavenia Atlas [phone]) on 11/17/2020 10:00:15 PM   Radiology DG Chest 2 View  Result Date: 11/17/2020 CLINICAL DATA:  Shortness of breath over the last day. History of congestive heart failure. EXAM: CHEST - 2 VIEW COMPARISON:  10/23/2020 FINDINGS: Previous median sternotomy and CABG. Pacemaker/AICD in place. The heart is chronically enlarged. Chronic aortic atherosclerosis. There is pulmonary venous hypertension but no frank edema. Small effusions in the posterior costophrenic angles. No focal finding otherwise. IMPRESSION: Congestive heart failure. Cardiomegaly. Pulmonary venous hypertension. Small effusions in the posterior costophrenic angles. Electronically Signed   By: Nelson Chimes M.D.   On: 11/17/2020 17:06    Procedures Procedures (including critical care time)  Medications Ordered in ED Medications  albuterol (VENTOLIN HFA) 108 (90 Base) MCG/ACT inhaler 2 puff (has no administration in time range)  furosemide (LASIX) injection 20 mg (20 mg Intravenous Given 11/17/20 2207)    ED Course  I have reviewed the triage vital signs and the nursing notes.  Pertinent labs & imaging results that were available during my care of the patient  were reviewed by me and considered in my medical decision making (see chart for details).  Clinical Course as of 11/17/20 2259  Tue Nov 17, 2020  2235 EKG is sinus rhythm, right bundle branch block which is unchanged from previous.  BNP is over 3000, he has a baseline anemia and AKI, troponin is elevated at 29 but flat, no delta.  Chest x-ray shows pulmonary edema. [KH]    Clinical Course User Index [KH] Janyce Ellinger, Alvin Critchley, DO   MDM Rules/Calculators/A&P                          74 year old male with recent admission for runs of ventricular tachycardia which were captured by his ICD presents to the emergency department with progressing shortness of breath.  Patient denies any active chest pain/heaviness.  No swelling of his lower extremities.  Vitals are stable on arrival, he is sitting up, conversational, on room air with appropriate oxygen saturation.  He does have rales bilaterally in the bases.  Blood work shows a BNP over 3000, chest x-ray shows pulmonary edema consistent with CHF which has progressed since previous x-ray.  He has a flat elevated troponin.  Baseline AKI and anemia.  Patient appears to be possible viral illness with congestive heart failure exacerbation, clinically he looks stable.  Spoke with cardiology, given his extensive history of arrhythmia and ischemic cardiomyopathy they agree with observing the patient overnight for diuresis.  Patient  was given an IV dose of Lasix and has been admitted to the hospitalist service. Final Clinical Impression(s) / ED Diagnoses Final diagnoses:  Congestive heart failure, unspecified HF chronicity, unspecified heart failure type [facility])    Rx / DC Orders ED Discharge Orders    None       Lorelle Gibbs, DO 11/17/20 2259

## 2020-11-17 NOTE — H&P (Addendum)
History and Physical    [name] [id number] DOB: August 22, 1946 DOA: 11/17/2020  PCP: Hoyt Koch, MD Patient coming from: Home  Chief Complaint: Shortness of breath  HPI: [name] is a 74 y.o. male with medical history significant of CAD status post CABG and PCI, chronic combined systolic and diastolic CHF/ischemic cardiomyopathy with LVEF 20-25% and history of VT status post ICD, hypertension, hyperlipidemia, CKD stage IIIa, history of prostate and tonsillar cancer, history of DVT presenting to the ED with complaints of shortness of breath.  Patient reports dyspnea on exertion and cough for the past 3 to 4 days.  He was having low-grade fevers earlier but has not had them for the past few days.  He has also had 3 episodes of vomiting over the past week which has now resolved.  Denies abdominal pain or diarrhea.  States his ICD had shocked him prior to his recent hospitalization but he has not had any issues lately.  He is vaccinated against Covid.  States he has been checked for Covid multiple times recently and every time results came back negative.  ED Course: Afebrile.  Tachypneic with respiratory rate in the 20s.  Not tachycardic.  Not hypoxic.  WBC 7.0, hemoglobin 10.3, hematocrit 33.9, platelet 186K.  Sodium 140, potassium 3.7, chloride 107, bicarb 22, BUN 27, creatinine 1.7, glucose 106.  High-sensitivity troponin mildly elevated but stable (29 >29).  BNP significantly elevated at 3663.  SARS-CoV-2 PCR test and influenza panel both negative.  Chest x-ray showing cardiomegaly and small effusions in the posterior costophrenic angles; no frank edema.  Patient was given IV Lasix 20 mg.  Review of Systems:  All systems reviewed and apart from history of presenting illness, are negative.  Past Medical History:  Diagnosis Date  . Adult ADHD   . AICD (automatic cardioverter/defibrillator) present   . CAD (coronary artery disease)    Cath 2008, all grafts  patent // Cath 2017 - all grafts patent   . 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 Carotid artery disease ([place])    Doppler, February, 2012, mild plaque, 0-39% bilateral, plan followup in one year  . Dyspepsia   . Ejection fraction < 50%   . Elevated PSA 02/15/2011  . Esophageal stricture   . GERD (gastroesophageal reflux disease)   . HTN (hypertension)   . Hx of CABG    1991  . Hyperlipidemia   . 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 Malignant neoplasm of tonsil ([place])   . Myocardial infarction ([place]) 1998  . Prostate cancer ([place]) 2012   IMRT, ADT  . Thrombosis of arm    Left  . Venous thrombosis    Left arm    Past Surgical History:  Procedure Laterality Date  . CARDIAC CATHETERIZATION  12/05/06  . CARDIAC CATHETERIZATION N/A 12/21/2015   Procedure: Left Heart Cath and Coronary Angiography;  Surgeon: Lorretta Harp, MD;  Location: [facility];  Service: Cardiovascular;  Laterality: N/A;  . CORONARY ARTERY BYPASS GRAFT     LIMA_LAD, SVG-OM, Cx, RCA '91  . CYSTECTOMY     Hand  . 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 ICD Placement    . IMPLANTABLE CARDIOVERTER DEFIBRILLATOR (ICD) GENERATOR CHANGE  04/06/2015   Procedure:  Icd Generator Change;  Surgeon: Evans Lance, MD;  Location: [facility];  Service: Cardiovascular;;  . LEFT HEART CATH AND CORS/GRAFTS ANGIOGRAPHY N/A 10/23/2020   Procedure: LEFT HEART CATH AND CORS/GRAFTS ANGIOGRAPHY;  Surgeon: Jettie Booze, MD;  Location: [facility];  Service: Cardiovascular;  Laterality: N/A;  . menistectomies, left knee  '09  . ROTATOR CUFF REPAIR  2005, 2007  . Thumb Surgery    . TONSILLECTOMY  2005   Malignant lesion, w/XRT     reports that he has never smoked. He has never used smokeless  tobacco. He reports that he does not drink alcohol and does not use drugs.  Allergies  Allergen Reactions  . Other Anaphylaxis    Insect bites (Has EpiPen)    Family History  Problem Relation Age of Onset  . Alzheimer's disease Mother   . Dementia Mother        living in [place]  . Hyperlipidemia Father   . Hypertension Father   . CAD Father        had pacemaker  . Congestive Heart Failure Father   . Heart disease Brother        Bypass surgery  . Breast cancer Sister        survivor  . Colon cancer Neg Hx   . Prostate cancer Neg Hx   . Diabetes Neg Hx     Prior to Admission medications   Medication Sig Start Date End Date Taking? Authorizing Provider  amiodarone (PACERONE) 400 MG tablet 400 mg twice a day for 2 weeks, then 400 mg daily for 2 weeks, then 200mg  daily thereafter Patient taking differently: Take 200-400 mg by mouth See admin instructions. Takes 1 tablet (400 mg totally) twice a day for 2 weeks; then takes 1 tablet (400 mg totally) daily for 2 weeks; then 200mg  daily thereafter 10/25/20  Yes Almyra Deforest, PA  aspirin 81 MG EC tablet Take 81 mg by mouth daily.   Yes [provider]  atorvastatin (LIPITOR) 80 MG tablet TAKE 1 TABLET(80 MG) BY MOUTH DAILY AT 6 PM Patient taking differently: Take 80 mg by mouth every evening. 03/02/20  Yes Nahser, Wonda Cheng, MD  buPROPion (WELLBUTRIN XL) 300 MG 24 hr tablet Take 300 mg by mouth daily.   Yes [provider]  carvedilol (COREG) 12.5 MG tablet TAKE 1 TABLET(12.5 MG) BY MOUTH TWICE DAILY Patient taking differently: Take 12.5 mg by mouth in the morning and at bedtime. 03/11/20  Yes Nahser, Wonda Cheng, MD  doxycycline (VIBRA-TABS) 100 MG tablet Take 1 tablet (100 mg total) by mouth 2 (two) times daily. 11/13/20  Yes Hoyt Koch, MD  EPINEPHrine 0.3 mg/0.3 mL IJ SOAJ injection Inject 0.3 mLs (0.3 mg total) into the muscle as needed (anaphylaxis). Patient taking differently: Inject 0.3 mg into the muscle as needed  for anaphylaxis (call 911). 06/17/18  Yes Deno Etienne, DO  escitalopram (LEXAPRO) 20 MG tablet Take 20 mg by mouth daily.   Yes [provider]  esomeprazole (NEXIUM) 20 MG packet Take 20 mg by mouth daily before breakfast.   Yes [provider]  HYDROcodone-homatropine (HYCODAN) 5-1.5 MG/5ML syrup Take 5 mLs by mouth every 8 (eight) hours as needed for up to 5 days for cough. 11/13/20 11/18/20 Yes Hoyt Koch, MD  Multiple Vitamins-Minerals (OCUVITE PRESERVISION) TABS Take 1 tablet by mouth daily.   Yes [provider]  nitroGLYCERIN (NITROSTAT) 0.4 MG SL tablet Place 1 tablet (0.4 mg  total) under the tongue every 5 (five) minutes as needed for chest pain. 12/23/15  Yes Bhagat, Bhavinkumar, PA  sacubitril-valsartan (ENTRESTO) 24-26 MG Take 1 tablet by mouth 2 (two) times daily. With 30 day coupon Patient taking differently: Take 1 tablet by mouth 2 (two) times daily. 10/25/20  Yes Almyra Deforest, PA  zolpidem (AMBIEN) 10 MG tablet Take 1 tablet by mouth at bedtime. 02/28/14  Yes [provider]    Physical Exam: Vitals:   11/17/20 2100 11/17/20 2130 11/17/20 2207 11/17/20 2315  BP: 126/86 129/87 129/89 131/88  Pulse: 64 66 65 68  Resp: (!) 27 (!) 27 (!) 23 (!) 25  Temp:      TempSrc:      SpO2: 95% 97% 97% 98%  Weight:      Height:        Physical Exam Constitutional:      General: He is not in acute distress. HENT:     Head: Normocephalic and atraumatic.  Eyes:     Extraocular Movements: Extraocular movements intact.     Conjunctiva/sclera: Conjunctivae normal.  Neck:     Comments: JVD present Cardiovascular:     Rate and Rhythm: Normal rate and regular rhythm.     Pulses: Normal pulses.  Pulmonary:     Effort: Pulmonary effort is normal. No respiratory distress.     Breath sounds: No wheezing.     Comments: Mild bibasilar rales Abdominal:     General: Bowel sounds are normal. There is no distension.     Palpations: Abdomen is soft.      Tenderness: There is no abdominal tenderness.  Musculoskeletal:        General: No swelling or tenderness.     Cervical back: Normal range of motion and neck supple.  Skin:    General: Skin is warm and dry.  Neurological:     General: No focal deficit present.     Mental Status: He is alert and oriented to person, place, and time.     Labs on Admission: I have personally reviewed following labs and imaging studies  CBC: Recent Labs  Lab 11/17/20 1644  WBC 7.0  HGB 10.3*  HCT 33.9*  MCV 88.5  PLT 99991111   Basic Metabolic Panel: Recent Labs  Lab 11/17/20 1644  NA 140  K 3.7  CL 107  CO2 22  GLUCOSE 106*  BUN 27*  CREATININE 1.72*  CALCIUM 8.5*   GFR: Estimated Creatinine Clearance: 42 mL/min (A) (by C-G formula based on SCr of 1.72 mg/dL (H)). Liver Function Tests: No results for input(s): AST, ALT, ALKPHOS, BILITOT, PROT, ALBUMIN in the last 168 hours. No results for input(s): LIPASE, AMYLASE in the last 168 hours. No results for input(s): AMMONIA in the last 168 hours. Coagulation Profile: No results for input(s): INR, PROTIME in the last 168 hours. Cardiac Enzymes: No results for input(s): CKTOTAL, CKMB, CKMBINDEX, TROPONINI in the last 168 hours. BNP (last 3 results) No results for input(s): PROBNP in the last 8760 hours. HbA1C: No results for input(s): HGBA1C in the last 72 hours. CBG: No results for input(s): GLUCAP in the last 168 hours. Lipid Profile: No results for input(s): CHOL, HDL, LDLCALC, TRIG, CHOLHDL, LDLDIRECT in the last 72 hours. Thyroid Function Tests: No results for input(s): TSH, T4TOTAL, FREET4, T3FREE, THYROIDAB in the last 72 hours. Anemia Panel: No results for input(s): VITAMINB12, FOLATE, FERRITIN, TIBC, IRON, RETICCTPCT in the last 72 hours. Urine analysis:    Component Value Date/Time  COLORURINE ORANGE (A) 03/18/2016 1134   APPEARANCEUR CLEAR 03/18/2016 1134   LABSPEC 1.024 03/18/2016 1134   PHURINE 8.0 03/18/2016 1134    GLUCOSEU NEGATIVE 03/18/2016 1134   GLUCOSEU NEGATIVE 02/08/2011 1102   HGBUR NEGATIVE 03/18/2016 1134   BILIRUBINUR MODERATE (A) 03/18/2016 1134   KETONESUR 15 (A) 03/18/2016 1134   PROTEINUR NEGATIVE 03/18/2016 1134   UROBILINOGEN 0.2 02/08/2011 1102   NITRITE POSITIVE (A) 03/18/2016 1134   LEUKOCYTESUR SMALL (A) 03/18/2016 1134    Radiological Exams on Admission: DG Chest 2 View  Result Date: 11/17/2020 CLINICAL DATA:  Shortness of breath over the last day. History of congestive heart failure. EXAM: CHEST - 2 VIEW COMPARISON:  10/23/2020 FINDINGS: Previous median sternotomy and CABG. Pacemaker/AICD in place. The heart is chronically enlarged. Chronic aortic atherosclerosis. There is pulmonary venous hypertension but no frank edema. Small effusions in the posterior costophrenic angles. No focal finding otherwise. IMPRESSION: Congestive heart failure. Cardiomegaly. Pulmonary venous hypertension. Small effusions in the posterior costophrenic angles. Electronically Signed   By: Nelson Chimes M.D.   On: 11/17/2020 17:06    EKG: Independently reviewed.  Sinus rhythm, RBBB, LAFB.  No significant change compared to prior tracing from November 2021.  Assessment/Plan Principal Problem:   CHF exacerbation (HCC) Active Problems:   Hyperlipidemia with target LDL less than 70   Carotid artery disease (HCC)   VT (ventricular tachycardia) (HCC)   Acute on chronic kidney failure (HCC)   Acute on chronic combined systolic and diastolic CHF: Presenting with complaints of dyspnea on exertion and cough.  Chest x-ray showing cardiomegaly and small effusions in the posterior costophrenic angles; no frank edema.  However, does have signs of volume overload including bibasilar rales, JVD, and significantly elevated BNP of 3663.  Echo done 10/24/2020 showing severely reduced LVEF of 20 to [id number], grade 2 diastolic dysfunction, and moderate to severe mitral regurgitation.  Not hypoxic or tachypneic at  present. -Patient received IV Lasix 20 mg in the ED. Give additional IV Lasix 20 mg x1.  Given AKI, repeat labs in the morning to check renal function before ordering further doses of Lasix.  Monitor intake and output, daily weights.  Low-sodium diet with fluid restriction.  I have sent an inbox message to Gay Filler requesting cardiology consultation in the morning.  Continue Coreg.  Hold Entresto given AKI.  AKI on CKD stage IIIa: BUN 27, creatinine 1.7.  Baseline creatinine around 1.1.  AKI could possibly be cardiorenal in the setting of acutely decompensated CHF vs prerenal from dehydration as patient does endorse recent history of symptoms consistent with a viral illness/vomiting (now resolved). -IV Lasix administered for decompensated CHF as mentioned above.  Repeat BMP in the morning to assess renal function.  Avoid any other nephrotoxic agents, hold Entresto.  Monitor urine output.  Check urine sodium and creatinine.  History of CAD status post CABG and PCI: Patient is not endorsing any chest pain.  High-sensitivity troponin mildly elevated but stable (29 >29).  Suspect mild troponin elevation is due to demand ischemia from decompensated CHF. -Continue aspirin, beta-blocker, statin  History of VT: Status post ICD.  Patient denies any issues at present. -Continue amiodarone  Hypertension: Stable.  Hyperlipidemia -Continue Lipitor  Chronic normocytic anemia: Hemoglobin 10.3, no significant change from baseline.  No signs of active bleeding.  Depression -Continue bupropion and escitalopram  GERD -Continue Nexium  DVT prophylaxis: Subcutaneous heparin Code Status: Patient wishes to be full code. Family Communication: No family available at this time. Disposition  Plan: Status is: Inpatient  Remains inpatient appropriate because:IV treatments appropriate due to intensity of illness or inability to take PO, Inpatient level of care appropriate due to severity of illness and Needs  diuresis for decompensated CHF   Dispo: The patient is from: Home              Anticipated d/c is to: Home              Anticipated d/c date is: 3 days              Patient currently is not medically stable to d/c.  The medical decision making on this patient was of high complexity and the patient is at high risk for clinical deterioration, therefore this is a level 3 visit.  Shela Leff MD [facility]  If 7PM-7AM, please contact night-coverage [email or web address]  11/18/2020, 1:26 AM

## 2020-11-18 ENCOUNTER — Encounter (HOSPITAL_COMMUNITY): Payer: Self-pay | Admitting: Internal Medicine

## 2020-11-18 DIAGNOSIS — N189 Chronic kidney disease, unspecified: Secondary | ICD-10-CM

## 2020-11-18 DIAGNOSIS — I472 Ventricular tachycardia: Secondary | ICD-10-CM

## 2020-11-18 DIAGNOSIS — N179 Acute kidney failure, unspecified: Secondary | ICD-10-CM

## 2020-11-18 LAB — BASIC METABOLIC PANEL
Anion gap: 15 (ref 5–15)
BUN: 26 mg/dL — ABNORMAL HIGH (ref 8–23)
CO2: 19 mmol/L — ABNORMAL LOW (ref 22–32)
Calcium: 8.3 mg/dL — ABNORMAL LOW (ref 8.9–10.3)
Chloride: 106 mmol/L (ref 98–111)
Creatinine, Ser: 1.56 mg/dL — ABNORMAL HIGH (ref 0.61–1.24)
GFR, Estimated: 47 mL/min — ABNORMAL LOW (ref >60)
Glucose, Bld: 94 mg/dL (ref 70–99)
Potassium: 3.1 mmol/L — ABNORMAL LOW (ref 3.5–5.1)
Sodium: 140 mmol/L (ref 135–145)

## 2020-11-18 LAB — CREATININE, URINE, RANDOM: Creatinine, Urine: 46.43 mg/dL

## 2020-11-18 LAB — MAGNESIUM: Magnesium: 1.7 mg/dL (ref 1.7–2.4)

## 2020-11-18 LAB — PROCALCITONIN: Procalcitonin: 0.1 ng/mL

## 2020-11-18 LAB — SODIUM, URINE, RANDOM: Sodium, Ur: 112 mmol/L

## 2020-11-18 MED ORDER — ASPIRIN EC 81 MG PO TBEC
81.0000 mg | DELAYED_RELEASE_TABLET | Freq: Every day | ORAL | Status: DC
  Administered 2020-11-18 – 2020-12-05 (×18): 81 mg via ORAL
  Filled 2020-11-18 (×18): qty 1

## 2020-11-18 MED ORDER — PROSIGHT PO TABS
1.0000 | ORAL_TABLET | Freq: Every day | ORAL | Status: DC
  Administered 2020-11-18 – 2020-11-24 (×7): 1 via ORAL
  Filled 2020-11-18 (×7): qty 1

## 2020-11-18 MED ORDER — PANTOPRAZOLE SODIUM 40 MG PO TBEC
40.0000 mg | DELAYED_RELEASE_TABLET | Freq: Every day | ORAL | Status: DC
  Administered 2020-11-18 – 2020-12-05 (×17): 40 mg via ORAL
  Filled 2020-11-18 (×17): qty 1

## 2020-11-18 MED ORDER — ONDANSETRON HCL 4 MG/2ML IJ SOLN: 4.0000 mg | Freq: Four times a day (QID) | INTRAMUSCULAR | Status: DC | PRN

## 2020-11-18 MED ORDER — SACUBITRIL-VALSARTAN 24-26 MG PO TABS
1.0000 | ORAL_TABLET | Freq: Two times a day (BID) | ORAL | Status: DC
  Administered 2020-11-18 – 2020-11-21 (×7): 1 via ORAL
  Filled 2020-11-18 (×8): qty 1

## 2020-11-18 MED ORDER — FUROSEMIDE 10 MG/ML IJ SOLN: 20.0000 mg | Freq: Once | INTRAMUSCULAR | Status: DC

## 2020-11-18 MED ORDER — AMIODARONE HCL IN DEXTROSE 360-4.14 MG/200ML-% IV SOLN
INTRAVENOUS | Status: AC
  Filled 2020-11-18: qty 200

## 2020-11-18 MED ORDER — GUAIFENESIN-DM 100-10 MG/5ML PO SYRP: 5.0000 mL | ORAL_SOLUTION | ORAL | Status: DC | PRN

## 2020-11-18 MED ORDER — ATORVASTATIN CALCIUM 80 MG PO TABS
80.0000 mg | ORAL_TABLET | Freq: Every evening | ORAL | Status: DC
  Administered 2020-11-18 – 2020-12-01 (×14): 80 mg via ORAL
  Filled 2020-11-18 (×15): qty 1

## 2020-11-18 MED ORDER — AMIODARONE HCL 200 MG PO TABS: 400.0000 mg | ORAL_TABLET | Freq: Every day | ORAL | Status: DC

## 2020-11-18 MED ORDER — MAGNESIUM SULFATE 2 GM/50ML IV SOLN
2.0000 g | Freq: Once | INTRAVENOUS | Status: AC
  Administered 2020-11-18: 2 g via INTRAVENOUS
  Filled 2020-11-18: qty 50

## 2020-11-18 MED ORDER — HEPARIN SODIUM (PORCINE) 5000 UNIT/ML IJ SOLN
5000.0000 [IU] | Freq: Three times a day (TID) | INTRAMUSCULAR | Status: DC
  Administered 2020-11-18 – 2020-11-22 (×13): 5000 [IU] via SUBCUTANEOUS
  Filled 2020-11-18 (×13): qty 1

## 2020-11-18 MED ORDER — AMIODARONE HCL IN DEXTROSE 360-4.14 MG/200ML-% IV SOLN
30.0000 mg/h | INTRAVENOUS | Status: DC
  Administered 2020-11-18 (×2): 30 mg/h via INTRAVENOUS
  Filled 2020-11-18 (×2): qty 200

## 2020-11-18 MED ORDER — POTASSIUM CHLORIDE CRYS ER 20 MEQ PO TBCR
40.0000 meq | EXTENDED_RELEASE_TABLET | ORAL | Status: AC
Start: 2020-11-18 — End: 2020-11-18
  Administered 2020-11-18 (×2): 40 meq via ORAL
  Filled 2020-11-18 (×2): qty 2

## 2020-11-18 MED ORDER — ACETAMINOPHEN 650 MG RE SUPP: 650.0000 mg | Freq: Four times a day (QID) | RECTAL | Status: DC | PRN

## 2020-11-18 MED ORDER — BUPROPION HCL ER (XL) 150 MG PO TB24
300.0000 mg | ORAL_TABLET | Freq: Every day | ORAL | Status: DC
  Administered 2020-11-18 – 2020-12-05 (×18): 300 mg via ORAL
  Filled 2020-11-18 (×18): qty 2

## 2020-11-18 MED ORDER — FUROSEMIDE 10 MG/ML IJ SOLN: 20.0000 mg | Freq: Every day | INTRAMUSCULAR | Status: DC

## 2020-11-18 MED ORDER — POTASSIUM CHLORIDE CRYS ER 20 MEQ PO TBCR
20.0000 meq | EXTENDED_RELEASE_TABLET | Freq: Every day | ORAL | Status: DC
  Administered 2020-11-19 – 2020-11-24 (×6): 20 meq via ORAL
  Filled 2020-11-18 (×6): qty 1

## 2020-11-18 MED ORDER — ACETAMINOPHEN 325 MG PO TABS: 650.0000 mg | ORAL_TABLET | Freq: Four times a day (QID) | ORAL | Status: DC | PRN

## 2020-11-18 MED ORDER — AMIODARONE HCL IN DEXTROSE 360-4.14 MG/200ML-% IV SOLN
60.0000 mg/h | INTRAVENOUS | Status: AC
  Administered 2020-11-18: 60 mg/h via INTRAVENOUS

## 2020-11-18 MED ORDER — CARVEDILOL 12.5 MG PO TABS
12.5000 mg | ORAL_TABLET | Freq: Two times a day (BID) | ORAL | Status: DC
  Administered 2020-11-18 – 2020-11-20 (×4): 12.5 mg via ORAL
  Filled 2020-11-18 (×4): qty 1

## 2020-11-18 MED ORDER — ESCITALOPRAM OXALATE 10 MG PO TABS
20.0000 mg | ORAL_TABLET | Freq: Every day | ORAL | Status: DC
  Administered 2020-11-18 – 2020-12-05 (×18): 20 mg via ORAL
  Filled 2020-11-18 (×18): qty 2

## 2020-11-18 NOTE — Progress Notes (Signed)
   Pt is admitted with hx of CAD, CABG,  Chronic combined CHF, ICD placement Has had fever this week + cough, white sputum Had slow VT this am - too slow to triger the ICD, fairly well tolerated.  Has converted to sinus rhythm on IV amio but cannot tell the difference. Still feels short of breath  covid is -  BNP is 3663  He received Lasix 20 mg IV and has already diuresed 1.1 L. We will continue IV Lasix for the next several days and follow him clinically. Continue potassium and magnesium replacement. Restart entresto ( no on  Inpatient meds)

## 2020-11-18 NOTE — Plan of Care (Signed)
  Problem: Activity: Goal: Risk for activity intolerance will decrease Outcome: Progressing   Problem: Coping: Goal: Level of anxiety will decrease Outcome: Progressing   

## 2020-11-18 NOTE — Progress Notes (Signed)
  ReDS Clip Diuretic Study Pt study # [id number]  Your patient has been enrolled in the ReDS Clip Diuretic Study  SCr improved 1.72>1.56, weight 185 lbs. Currently receiving IV lasix 20 mg daily. Not taking PTA diuretics.   Changes to prescribed diuretics recommended:  Stop IV lasix Provider contacted: Dr. Acie Fredrickson Recommendation was accepted by provider.    REDS Clip  READING= 32%  CHEST RULER = 34 Clip Station = D   Orthodema score = 0 Signs/Symptoms Score   Mild edema, no orthopnea 0 No congestion  Moderate edema, no orthopnea 1 Low-grade orthodema/congestion  Severe edema OR orthopnea 2   Moderate edema and orthopnea 3 High-grade orthodema/congestion  Severe edema AND orthopnea 4    Kerby Nora, PharmD, BCPS Heart Failure Stewardship Pharmacist Phone [phone]  Please check [email or web address] for unit-specific pharmacist phone numbers

## 2020-11-18 NOTE — ED Provider Notes (Signed)
Asked by nursing staff to see patient.  Patient is a border in the ED, admitted to the hospitalist service.  Quick review of patient's chart reveals that he has been hospitalized for congestive heart failure exacerbation.  He has a history of ischemic cardiomyopathy and V. tach.  He does have an ICD in place.  Patient with wide-complex tachycardia at 119 bpm.  He is awake and alert, asymptomatic.  Vital signs are otherwise unremarkable.  No active chest pain.  Tracing concerning for slow V. tach not triggering his ICD.  Will initiate amiodarone drip, patient does normally take amiodarone p.o.   Orpah Greek, MD 11/18/20 [phone]

## 2020-11-18 NOTE — ED Notes (Signed)
Pt noted to be in Vtach HR 110-115. MD at bedside. Pt denies CP, mild SOB.

## 2020-11-18 NOTE — ED Notes (Signed)
Pt's wife at bedside who reports pt has had weakness for the past few days and has had 3-4 falls at home.

## 2020-11-18 NOTE — Consult Note (Addendum)
Cardiology Consultation:   Patient ID: [name] MRN: [id number]; DOB: 1946/07/26  Admit date: 11/17/2020 Date of Consult: 11/18/2020  Primary Care Provider: Hoyt Koch, MD [facility] Cardiologist: Mertie Moores, MD  [facility] Electrophysiologist:  Cristopher Peru, MD    Patient Profile:   [name] is a 74 y.o. male with a hx of CAD (CABG 1991), ICM, ICD, chronic CHF (systolic), VT, HTN, HLD, prostate CA and DVT, CKD (III) who is being seen today for the evaluation of VT at the request of Dr. Acie Fredrickson.  History of Present Illness:  1. Mr. [name] was admitted to [facility] 10/23/20 with c/o abrupt weakness, and ICD therapies, was cathed during that stay and showedLeft heart catheterization today shows occluded native vessels but patent grafts elevated LVEDP Treated with diuresis, amiodarone gtt > PO amiodarone, his losartan > entresto Discharged 10/25/2020 400mg  BI x2 weeks > 400mg  daily  2 weeks > 200mg  daily  No outpt follow up yet   Came to [facility] yesterday with 3-4 days of increasing SOB and a few days ago, a low grade fever as well, reported a number of recent negative COVID tests Found tachypneic with respiratory rate in the 20s.  Not tachycardic.  Not hypoxic.  WBC 7.0, hemoglobin 10.3, hematocrit 33.9, platelet 186K.  Sodium 140, potassium 3.7, chloride 107, bicarb 22, BUN 27, creatinine 1.7, glucose 106.  High-sensitivity troponin mildly elevated but stable (29 >29).  BNP significantly elevated at 3663.  SARS-CoV-2 PCR test and influenza panel both negative. Chest x-ray showing cardiomegaly and small effusions in the posterior costophrenic angles; no frank edema. Admitted with acute/chronic CHF, started on IV lasix, and cardiology consulted He developed WCT c/w VT though rate 119 without any symptoms, started on amiodarone bolus > gtt and converted back to [place] cardiology saw him, maintained on gtt, no changes were made. Dr. Acie Fredrickson saw him this  morning with plans to continue diuresis. SOB unchanged planned to continue IV diuresis and asked EP to see him for the VT.  LABS  K+ 3.1 being replaced BUN/Creat > 26/1.56 Mag 1.7  He is not SOB at rest here currently though c/w productive cough.  No CP. He says he was mildly aware of his heart pounding a bit harder, perhaps fast, but no other symptoms when they told him his HR was fast. No near syncope or syncope here or at home.   Device information MDT single chamber ICD implanted 2008 > gen change 2016   Past Medical History:  Diagnosis Date  . Adult ADHD   . AICD (automatic cardioverter/defibrillator) present   . CAD (coronary artery disease)    Cath 2008, all grafts patent // Cath 2017 - all grafts patent   . 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 Carotid artery disease ([place])    Doppler, February, 2012, mild plaque, 0-39% bilateral, plan followup in one year  . Dyspepsia   . Ejection fraction < 50%   . Elevated PSA 02/15/2011  . Esophageal stricture   . GERD (gastroesophageal reflux disease)   . HTN (hypertension)   . Hx of CABG    1991  . Hyperlipidemia   . 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 Myocardial infarction ([place]) 1998  . Prostate cancer ([place]) 2012   IMRT,  ADT  . Thrombosis of arm    Left  . Venous thrombosis    Left arm    Past Surgical History:  Procedure Laterality Date  . CARDIAC CATHETERIZATION  12/05/06  . CARDIAC CATHETERIZATION N/A 12/21/2015   Procedure: Left Heart Cath and Coronary Angiography;  Surgeon: Lorretta Harp, MD;  Location: [facility];  Service: Cardiovascular;  Laterality: N/A;  . CORONARY ARTERY BYPASS GRAFT     LIMA_LAD, SVG-OM, Cx, RCA '91  . CYSTECTOMY     Hand  . HAND CONTRACTURE RELEASE     bilateral release of  Dupyrten's contracture: '10 and '11  . ICD Placement    . IMPLANTABLE CARDIOVERTER DEFIBRILLATOR (ICD) GENERATOR CHANGE  04/06/2015   Procedure: Icd Generator Change;  Surgeon: Evans Lance, MD;  Location: [facility];  Service: Cardiovascular;;  . LEFT HEART CATH AND CORS/GRAFTS ANGIOGRAPHY N/A 10/23/2020   Procedure: LEFT HEART CATH AND CORS/GRAFTS ANGIOGRAPHY;  Surgeon: Jettie Booze, MD;  Location: [facility];  Service: Cardiovascular;  Laterality: N/A;  . menistectomies, left knee  '09  . ROTATOR CUFF REPAIR  2005, 2007  . Thumb Surgery    . TONSILLECTOMY  2005   Malignant lesion, w/XRT     Home Medications:  Prior to Admission medications   Medication Sig Start Date End Date Taking? Authorizing Provider  amiodarone (PACERONE) 400 MG tablet 400 mg twice a day for 2 weeks, then 400 mg daily for 2 weeks, then 200mg  daily thereafter Patient taking differently: Take 200-400 mg by mouth See admin instructions. Takes 1 tablet (400 mg totally) twice a day for 2 weeks; then takes 1 tablet (400 mg totally) daily for 2 weeks; then 200mg  daily thereafter 10/25/20  Yes Almyra Deforest, PA  aspirin 81 MG EC tablet Take 81 mg by mouth daily.   Yes [provider]  atorvastatin (LIPITOR) 80 MG tablet TAKE 1 TABLET(80 MG) BY MOUTH DAILY AT 6 PM Patient taking differently: Take 80 mg by mouth every evening. 03/02/20  Yes Nahser, Wonda Cheng, MD  buPROPion (WELLBUTRIN XL) 300 MG 24 hr tablet Take 300 mg by mouth daily.   Yes [provider]  carvedilol (COREG) 12.5 MG tablet TAKE 1 TABLET(12.5 MG) BY MOUTH TWICE DAILY Patient taking differently: Take 12.5 mg by mouth in the morning and at bedtime. 03/11/20  Yes Nahser, Wonda Cheng, MD  doxycycline (VIBRA-TABS) 100 MG tablet Take 1 tablet (100 mg total) by mouth 2 (two) times daily. 11/13/20  Yes Hoyt Koch, MD  EPINEPHrine 0.3 mg/0.3 mL IJ SOAJ injection Inject 0.3 mLs (0.3 mg total) into the muscle as needed  (anaphylaxis). Patient taking differently: Inject 0.3 mg into the muscle as needed for anaphylaxis (call 911). 06/17/18  Yes Deno Etienne, DO  escitalopram (LEXAPRO) 20 MG tablet Take 20 mg by mouth daily.   Yes [provider]  esomeprazole (NEXIUM) 20 MG packet Take 20 mg by mouth daily before breakfast.   Yes [provider]  HYDROcodone-homatropine (HYCODAN) 5-1.5 MG/5ML syrup Take 5 mLs by mouth every 8 (eight) hours as needed for up to 5 days for cough. 11/13/20 11/18/20 Yes Hoyt Koch, MD  Multiple Vitamins-Minerals (OCUVITE PRESERVISION) TABS Take 1 tablet by mouth daily.   Yes [provider]  nitroGLYCERIN (NITROSTAT) 0.4 MG SL tablet Place 1 tablet (0.4 mg total) under the tongue every 5 (five) minutes as needed for chest pain. 12/23/15  Yes Bhagat, Bhavinkumar, PA  sacubitril-valsartan (ENTRESTO)  24-26 MG Take 1 tablet by mouth 2 (two) times daily. With 30 day coupon Patient taking differently: Take 1 tablet by mouth 2 (two) times daily. 10/25/20  Yes Almyra Deforest, PA  zolpidem (AMBIEN) 10 MG tablet Take 1 tablet by mouth at bedtime. 02/28/14  Yes [provider]    Inpatient Medications: Scheduled Meds: . aspirin EC  81 mg Oral Daily  . atorvastatin  80 mg Oral QPM  . buPROPion  300 mg Oral Daily  . carvedilol  12.5 mg Oral BID WC  . escitalopram  20 mg Oral Daily  . furosemide  20 mg Intravenous Once  . [START ON 11/19/2020] furosemide  20 mg Intravenous Daily  . heparin  5,000 Units Subcutaneous Q8H  . multivitamin  1 tablet Oral Daily  . pantoprazole  40 mg Oral QAC breakfast  . [START ON 11/19/2020] potassium chloride  20 mEq Oral Daily  . sacubitril-valsartan  1 tablet Oral BID   Continuous Infusions: . amiodarone 30 mg/hr (11/18/20 0837)   PRN Meds: acetaminophen **OR** acetaminophen, guaiFENesin-dextromethorphan  Allergies:    Allergies  Allergen Reactions  . Other Anaphylaxis    Insect bites (Has EpiPen)    Social  History:   Social History   Socioeconomic History  . Marital status: Married    Spouse name: Not on file  . Number of children: 0  . Years of education: 34  . Highest education level: Not on file  Occupational History  . Occupation: Retired    Fish farm manager: RETIRED    CommentOceanographer for [employer]  Tobacco Use  . Smoking status: Never Smoker  . Smokeless tobacco: Never Used  Vaping Use  . Vaping Use: Never used  Substance and Sexual Activity  . Alcohol use: No  . Drug use: No  . Sexual activity: Yes    Birth control/protection: None  Other Topics Concern  . Not on file  Social History Narrative   [employer], [employer]. 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 Marriage in good health, wife in good health. ACP discussed Jan '15: no CPR, no mechanical ventilation. He is referred to [email or web address] and provided with packet with HCPOA-his wife./ Advanced directive.   Fun/Hobby: Yard work, Architect working.    Social Determinants of Health   Financial Resource Strain: Not on file  Food Insecurity: Not on file  Transportation Needs: Not on file  Physical Activity: Not on file  Stress: Not on file  Social Connections: Not on file  Intimate Partner Violence: Not on file    Family History:   Family History  Problem Relation Age of Onset  . Alzheimer's disease Mother   . Dementia Mother        living in [place]  . Hyperlipidemia Father   . Hypertension Father   . CAD Father        had pacemaker  . Congestive Heart Failure Father   . Heart disease Brother        Bypass surgery  . Breast cancer Sister        survivor  . Colon cancer Neg Hx   . Prostate cancer Neg Hx   . Diabetes Neg Hx      ROS:  Please see the history of present illness.  All other ROS reviewed and negative.     Physical Exam/Data:   Vitals:   11/18/20 0801 11/18/20 0845 11/18/20 0846 11/18/20 0847  BP: 140/82 119/86    Pulse: 66 (!) 110 (!) 111 63  Resp:  (!) 33 20 (!) 25  Temp:       TempSrc:      SpO2:  95% 97% 95%  Weight:      Height:        Intake/Output Summary (Last 24 hours) at 11/18/2020 1052 Last data filed at 11/18/2020 0838 Gross per 24 hour  Intake --  Output 1100 ml  Net -1100 ml   Last 3 Weights 11/17/2020 10/25/2020 10/23/2020  Weight (lbs) 185 lb 174 lb 9.6 oz 183 lb  Weight (kg) 83.915 kg 79.198 kg 83.008 kg     Body mass index is 25.09 kg/m.  General:  Well nourished, well developed, in no acute distress HEENT: normal Lymph: no adenopathy Neck: no JVD Endocrine:  No thryomegaly Vascular: No carotid bruits Cardiac:  RRR; soft SM, nogGallops or rubs Lungs: slightly diminished at the bases, CTA b/l otherwise, no wheezing, rhonchi or rales  Abd: soft, nontender  Ext: trace if any edema Musculoskeletal:  No deformities Skin: warm and dry  Neuro:   no focal abnormalities noted Psych:  Normal affect   EKG:  The EKG was personally reviewed and demonstrates:    SRB 58bpm, RBBB, LAD SR 5=65 WCT 119 SR 74bpm  Telemetry:  Telemetry was personally reviewed and demonstrates:   SB/SR 50's-60's, VT noted, last was about 8 AM this AM, lasted 6 minutes, rate 110's  Relevant CV Studies:   Echo 10/24/2020 1. There is no left ventricular thrombus. Left ventricular ejection  fraction, by estimation, is 20 to 25%. The left ventricle has severely  decreased function. The left ventricle demonstrates regional wall motion  abnormalities (see scoring  diagram/findings for description). The left ventricular internal cavity  size was moderately to severely dilated. Left ventricular diastolic  parameters are consistent with Grade II diastolic dysfunction  (pseudonormalization). Elevated left atrial  pressure. There is mild apical dyskinesis. There is akinesis of the  mid-apical anterior wall and septum and the inferior apex.  2. Right ventricular systolic function is mildly reduced. The right  ventricular size is normal. There is normal pulmonary  artery systolic  pressure.  3. Left atrial size was severely dilated.  4. There is mitral leaflet malcoaptation due to adverse chamber  remodeling. Effective regurgitant orifice area 0.22 cm, regurgitant  volume 43 ml, regurgitant fraction 55%. The mitral valve is grossly  normal. Moderate to severe mitral valve  regurgitation. No evidence of mitral stenosis.  5. The aortic valve is tricuspid. Aortic valve regurgitation is mild.  Mild aortic valve sclerosis is present, with no evidence of aortic valve  stenosis.  6. The inferior vena cava is normal in size with greater than 50%  respiratory variability, suggesting right atrial pressure of 3 mmHg.   Left heart cath October 23, 2020  Ost LAD to Prox LAD lesion is 100% stenosed. Patent LIMA to LAD.  Ost RCA to Prox RCA lesion is 100% stenosed. Patent SVG to RCA.  Mid Cx lesion is 100% stenosed. Patent jump SVG to OM2/OM3  1st Mrg lesion is 50% stenosed. This is a small vessel, unchanged from prior cath.  LV end diastolic pressure is mildly elevated. LVEDP 20 mm Hg.  There is no aortic valve stenosis.  Tortuous left subclavian. Versacore wire used to navigate.   Laboratory Data:  High Sensitivity Troponin:   Recent Labs  Lab 10/23/20 1044 10/23/20 1717 11/17/20 1644 11/17/20 1828  TROPONINIHS 55* 84* 29* 29*     Chemistry Recent Labs  Lab 11/17/20 1644 11/18/20  0257  NA 140 140  K 3.7 3.1*  CL 107 106  CO2 22 19*  GLUCOSE 106* 94  BUN 27* 26*  CREATININE 1.72* 1.56*  CALCIUM 8.5* 8.3*  GFRNONAA 41* 47*  ANIONGAP 11 15    No results for input(s): PROT, ALBUMIN, AST, ALT, ALKPHOS, BILITOT in the last 168 hours. Hematology Recent Labs  Lab 11/17/20 1644  WBC 7.0  RBC 3.83*  HGB 10.3*  HCT 33.9*  MCV 88.5  MCH 26.9  MCHC 30.4  RDW 15.0  PLT 186   BNP Recent Labs  Lab 11/17/20 1645  BNP 3,663.4*    DDimer No results for input(s): DDIMER in the last 168 hours.   Radiology/Studies:  DG  Chest 2 View  Result Date: 11/17/2020 CLINICAL DATA:  Shortness of breath over the last day. History of congestive heart failure. EXAM: CHEST - 2 VIEW COMPARISON:  10/23/2020 FINDINGS: Previous median sternotomy and CABG. Pacemaker/AICD in place. The heart is chronically enlarged. Chronic aortic atherosclerosis. There is pulmonary venous hypertension but no frank edema. Small effusions in the posterior costophrenic angles. No focal finding otherwise. IMPRESSION: Congestive heart failure. Cardiomegaly. Pulmonary venous hypertension. Small effusions in the posterior costophrenic angles. Electronically Signed   By: Nelson Chimes M.D.   On: 11/17/2020 17:06     Assessment and Plan:   1. VT, slow and monomorphic     Home PO amiodarone >> gtt (without bolus)     None in a few hours now     Follow  Slower with amio on board Perhaps triggered by volume OL again  K+ is being replaced Will get device checked and look at how his zones are set up.  Continue otherwise with attending cardiology team and IMD for management of his HF other medical issues.  Dr. Rayann Heman will see later today   For questions or updates, please contact [facility] Please consult [email or web address] for contact info under    Signed, Baldwin Jamaica, PA-C  11/18/2020 10:52 AM   I have seen, examined the patient, and reviewed the above assessment and plan.  Changes to above are made where necessary.  On exam, RRR.  The patient has been observed to have VT in the setting of acute decompensated heart failure.   He is admitted for CHF management. IV amiodarone x 24 hours, then convert to amiodarone PO 200mg  BID. Cardiology to assist with CHF management. ICD adjusted to reduce VT zone to 120 BPM.    Co Sign: Thompson Grayer, MD 11/18/2020 9:13 PM

## 2020-11-18 NOTE — Progress Notes (Signed)
Just informed by RN that patient went into V. tach for about 5 to 10 minutes, rate in the 120s to 130s.  He was awake and alert, asymptomatic.  Nursing staff called ED physician at that time who evaluated the patient.  EKG showing slow V. tach, rate 119.  His ICD was not triggered.  He was started on amiodarone drip.  Now converted back to sinus rhythm, continues to be asymptomatic.  I spoke to on-call cardiologist Dr. Paticia Stack who recommends continuing IV amiodarone.  He will speak to EP team so they can see the patient in the morning.  Will also check magnesium level.

## 2020-11-18 NOTE — ED Notes (Signed)
Pt noted to be in [place] again, denies any chest pain or sob

## 2020-11-18 NOTE — ED Notes (Signed)
Lunch Tray Ordered @ 1102. 

## 2020-11-18 NOTE — ED Notes (Signed)
Pt converted back to NSR with MD at bedside

## 2020-11-18 NOTE — ED Notes (Signed)
MD to change bed status to SDU

## 2020-11-18 NOTE — Progress Notes (Signed)
PROGRESS NOTE    [name]  [id number] DOB: 03-25-1946 DOA: 11/17/2020 PCP: Hoyt Koch, MD   Brief Narrative:  [name] is a 74 y.o. male with medical history significant of CAD status post CABG and PCI, chronic combined systolic and diastolic CHF/ischemic cardiomyopathy with LVEF 20-25% and history of VT status post ICD, hypertension, hyperlipidemia, CKD stage IIIa, history of prostate and tonsillar cancer, history of DVT presenting to the ED with complaints of shortness of breath.  Patient reports dyspnea on exertion and cough for the past 3 to 4 days.  He was having low-grade fevers earlier but has not had them for the past few days.  He has also had 3 episodes of vomiting over the past week which has now resolved.  Denies abdominal pain or diarrhea.  States his ICD had shocked him prior to his recent hospitalization but he has not had any issues lately.  He is vaccinated against Covid.  States he has been checked for Covid multiple times recently and every time results came back negative.  ED Course: Afebrile.  Tachypneic with respiratory rate in the 20s.  Not tachycardic.  Not hypoxic.  WBC 7.0, hemoglobin 10.3, hematocrit 33.9, platelet 186K.  Sodium 140, potassium 3.7, chloride 107, bicarb 22, BUN 27, creatinine 1.7, glucose 106.  High-sensitivity troponin mildly elevated but stable (29 >29).  BNP significantly elevated at 3663.  SARS-CoV-2 PCR test and influenza panel both negative.  Chest x-ray showing cardiomegaly and small effusions in the posterior costophrenic angles; no frank edema.Patient was given IV Lasix 20 mg.  Assessment & Plan: Acute on chronic combined systolic and diastolic CHF: -Patient presented with signs of fluid overload.  Chest x-ray shows cardiomegaly and small effusion in the posterior costophrenic angles.  Has bibasilar Rales, JVD and significantly elevated BNP of 3663. -Echo from 10/24/2020 showing severely reduced ejection  fraction of 20 to [id number], grade 2 diastolic dysfunction, moderate to severe MR. -Received Lasix 20 g IV once in ED.  Continue IV Lasix -Strict INO's and daily weight.  Monitor signs of fluid overload.  Monitor electrolytes closely. -Keep potassium more than 4, magnesium more than 2.    V. tach status post ICD: -Patient went into V. tach for about 5 to 10 minutes with rate in 120s to 130s.  Patient started on amiodarone gtt. -Cardiology and EP consulted-appreciate help  AKI on CKD stage IIIa: -Could be secondary to cardiorenal in the setting of acutely decompensated CHF versus dehydration. -Continue to monitor kidney function closely.  Hold nephrotoxic medication. -Repeat BMP tomorrow a.m.  History of coronary artery disease status post CABG and PCI: Patient denies ACS symptoms.  Troponin mildly elevated but stable 29x2.  Suspected demand ischemia from decompensated CHF. -Continue aspirin, beta-blocker and statin  Hypertension: Stable.  Continue Coreg and Entresto as per cardiology recommendations  Hyperlipidemia: Continue Lipitor  GERD: Continue PPI  Hypokalemia and hypomagnesemia: Replenished.  Repeat BMP and magnesium level tomorrow a.m.  Depression/anxiety: Continue Lexapro, Wellbutrin  DVT prophylaxis: Heparin Code Status: None Family Communication: None present at bedside.  Plan of care discussed with patient in length and he verbalized understanding and agreed with it. Disposition Plan: Likely home in 1 to 2 days  Consultants:   Cardiology  Procedures:   None  Antimicrobials:   None  Status is: Inpatient   Dispo: The patient is from: Home              Anticipated d/c is to: Home  Anticipated d/c date is: 2 days              Patient currently is not medically stable to d/c.         Subjective: Patient seen and examined in ED.  Complaining of mild shortness of breath however denies chest pain, nausea, vomiting, lightheadedness, dizziness,  palpitation, leg swelling, orthopnea or PND.  Objective: Vitals:   11/18/20 1330 11/18/20 1345 11/18/20 1400 11/18/20 1415  BP: 115/78 106/69 116/73 104/70  Pulse: (!) 54 (!) 55 (!) 53 (!) 55  Resp: (!) 30 (!) 23 18 13   Temp:      TempSrc:      SpO2: 98% 95% 96% 97%  Weight:      Height:        Intake/Output Summary (Last 24 hours) at 11/18/2020 1435 Last data filed at 11/18/2020 1142 Gross per 24 hour  Intake --  Output 1200 ml  Net -1200 ml   Filed Weights   11/17/20 1653  Weight: 83.9 kg    Examination:  General exam: Appears calm and comfortable, on room air, communicating well Respiratory system: Clear to auscultation. Respiratory effort normal. Cardiovascular system: S1 & S2 heard, RRR. No JVD, murmurs, rubs, gallops or clicks. No pedal edema. Gastrointestinal system: Abdomen is nondistended, soft and nontender. No organomegaly or masses felt. Normal bowel sounds heard. Central nervous system: Alert and oriented. No focal neurological deficits. Extremities: Symmetric 5 x 5 power. Skin: No rashes, lesions or ulcers Psychiatry: Judgement and insight appear normal. Mood & affect appropriate.    Data Reviewed: I have personally reviewed following labs and imaging studies  CBC: Recent Labs  Lab 11/17/20 1644  WBC 7.0  HGB 10.3*  HCT 33.9*  MCV 88.5  PLT [id number]   Basic Metabolic Panel: Recent Labs  Lab 11/17/20 1644 11/18/20 0257  NA 140 140  K 3.7 3.1*  CL 107 106  CO2 22 19*  GLUCOSE 106* 94  BUN 27* 26*  CREATININE 1.72* 1.56*  CALCIUM 8.5* 8.3*  MG  --  1.7   GFR: Estimated Creatinine Clearance: 46.3 mL/min (A) (by C-G formula based on SCr of 1.56 mg/dL (H)). Liver Function Tests: No results for input(s): AST, ALT, ALKPHOS, BILITOT, PROT, ALBUMIN in the last 168 hours. No results for input(s): LIPASE, AMYLASE in the last 168 hours. No results for input(s): AMMONIA in the last 168 hours. Coagulation Profile: No results for input(s): INR,  PROTIME in the last 168 hours. Cardiac Enzymes: No results for input(s): CKTOTAL, CKMB, CKMBINDEX, TROPONINI in the last 168 hours. BNP (last 3 results) No results for input(s): PROBNP in the last 8760 hours. HbA1C: No results for input(s): HGBA1C in the last 72 hours. CBG: No results for input(s): GLUCAP in the last 168 hours. Lipid Profile: No results for input(s): CHOL, HDL, LDLCALC, TRIG, CHOLHDL, LDLDIRECT in the last 72 hours. Thyroid Function Tests: No results for input(s): TSH, T4TOTAL, FREET4, T3FREE, THYROIDAB in the last 72 hours. Anemia Panel: No results for input(s): VITAMINB12, FOLATE, FERRITIN, TIBC, IRON, RETICCTPCT in the last 72 hours. Sepsis Labs: Recent Labs  Lab 11/18/20 1254  PROCALCITON 0.10    Recent Results (from the past 240 hour(s))  Novel Coronavirus, NAA ([facility])     Status: None   Collection Time: 11/13/20 10:09 AM   Specimen: Nasopharyngeal(NP) swabs in vial transport medium   Nasopharynge  Result Value Ref Range Status   SARS-CoV-2, NAA Not Detected Not Detected Final    Comment: This nucleic acid  amplification test was developed and its performance characteristics determined by [employer]. Nucleic acid amplification tests include RT-PCR and TMA. This test has not been FDA cleared or approved. This test has been authorized by FDA under an Emergency Use Authorization (EUA). This test is only authorized for the duration of time the declaration that circumstances exist justifying the authorization of the emergency use of in vitro diagnostic tests for detection of SARS-CoV-2 virus and/or diagnosis of COVID-19 infection under section 564(b)(1) of the Act, 21 U.S.C. 546EVO-3(J) (1), unless the authorization is terminated or revoked sooner. When diagnostic testing is negative, the possibility of a false negative result should be considered in the context of a patient's recent exposures and the presence of clinical signs and  symptoms consistent with COVID-19. An individual without symptoms of COVID-19 and who is not shedding SARS-CoV-2 virus wo uld expect to have a negative (not detected) result in this assay.   SARS-COV-2, NAA 2 DAY TAT     Status: None   Collection Time: 11/13/20 10:09 AM   Nasopharynge  Result Value Ref Range Status   SARS-CoV-2, NAA 2 DAY TAT Performed  Final  Resp Panel by RT-PCR (Flu A&B, Covid) Nasopharyngeal Swab     Status: None   Collection Time: 11/17/20 10:35 PM   Specimen: Nasopharyngeal Swab; Nasopharyngeal(NP) swabs in vial transport medium  Result Value Ref Range Status   SARS Coronavirus 2 by RT PCR NEGATIVE NEGATIVE Final    Comment: (NOTE) SARS-CoV-2 target nucleic acids are NOT DETECTED.  The SARS-CoV-2 RNA is generally detectable in upper respiratory specimens during the acute phase of infection. The lowest concentration of SARS-CoV-2 viral copies this assay can detect is 138 copies/mL. A negative result does not preclude SARS-Cov-2 infection and should not be used as the sole basis for treatment or other patient management decisions. A negative result may occur with  improper specimen collection/handling, submission of specimen other than nasopharyngeal swab, presence of viral mutation(s) within the areas targeted by this assay, and inadequate number of viral copies(<138 copies/mL). A negative result must be combined with clinical observations, patient history, and epidemiological information. The expected result is Negative.  Fact Sheet for Patients:  EntrepreneurPulse.com.au  Fact Sheet for Healthcare Providers:  IncredibleEmployment.be  This test is no t yet approved or cleared by the [place] FDA and  has been authorized for detection and/or diagnosis of SARS-CoV-2 by FDA under an Emergency Use Authorization (EUA). This EUA will remain  in effect (meaning this test can be used) for the duration of the COVID-19  declaration under Section 564(b)(1) of the Act, 21 U.S.C.section 360bbb-3(b)(1), unless the authorization is terminated  or revoked sooner.       Influenza A by PCR NEGATIVE NEGATIVE Final   Influenza B by PCR NEGATIVE NEGATIVE Final    Comment: (NOTE) The Xpert Xpress SARS-CoV-2/FLU/RSV plus assay is intended as an aid in the diagnosis of influenza from Nasopharyngeal swab specimens and should not be used as a sole basis for treatment. Nasal washings and aspirates are unacceptable for Xpert Xpress SARS-CoV-2/FLU/RSV testing.  Fact Sheet for Patients: EntrepreneurPulse.com.au  Fact Sheet for Healthcare Providers: IncredibleEmployment.be  This test is not yet approved or cleared by the [place] FDA and has been authorized for detection and/or diagnosis of SARS-CoV-2 by FDA under an Emergency Use Authorization (EUA). This EUA will remain in effect (meaning this test can be used) for the duration of the COVID-19 declaration under Section 564(b)(1) of the Act, 21 U.S.C. section 360bbb-3(b)(1),  unless the authorization is terminated or revoked.  Performed at [facility], [place] [address] [place], [place] [address]       Radiology Studies: DG Chest 2 View  Result Date: 11/17/2020 CLINICAL DATA:  Shortness of breath over the last day. History of congestive heart failure. EXAM: CHEST - 2 VIEW COMPARISON:  10/23/2020 FINDINGS: Previous median sternotomy and CABG. Pacemaker/AICD in place. The heart is chronically enlarged. Chronic aortic atherosclerosis. There is pulmonary venous hypertension but no frank edema. Small effusions in the posterior costophrenic angles. No focal finding otherwise. IMPRESSION: Congestive heart failure. Cardiomegaly. Pulmonary venous hypertension. Small effusions in the posterior costophrenic angles. Electronically Signed   By: Nelson Chimes M.D.   On: 11/17/2020 17:06    Scheduled Meds: . aspirin EC  81 mg Oral  Daily  . atorvastatin  80 mg Oral QPM  . buPROPion  300 mg Oral Daily  . carvedilol  12.5 mg Oral BID WC  . escitalopram  20 mg Oral Daily  . heparin  5,000 Units Subcutaneous Q8H  . multivitamin  1 tablet Oral Daily  . pantoprazole  40 mg Oral QAC breakfast  . [START ON 11/19/2020] potassium chloride  20 mEq Oral Daily  . sacubitril-valsartan  1 tablet Oral BID   Continuous Infusions: . amiodarone 30 mg/hr (11/18/20 0837)     LOS: 1 day   Time spent: 40 minutes  Ajene Carchi Loann Quill, MD [facility]  If 7PM-7AM, please contact night-coverage [email or web address] 11/18/2020, 2:35 PM

## 2020-11-18 NOTE — Consult Note (Signed)
[facility] Consult Note   Primary Physician: Hoyt Koch, MD Primary Cardiologist:  Mertie Moores, MD  Primary electrophysiologist: Cristopher Peru, MD   Reason for Consultation: Ventricular tachycardia  HPI:    [name] is 74 year old gentleman with a past medical history significant for CAD (hx of CABG, with patent grafts on last cath), ischemic cardiomyopathy (EF 20-25%, by echo), ICD placement, episodes of ventricular tachycardia, HTN, HL, CKD stage III, and prostate CA, who presented to the hospital with complaints of shortness of breath.  Denied any chest pain or lower leg swelling.  He has noticed a nonproductive cough for the past few days.  In the emergency department the vital signs were: BP 120/77 mmHg, heart rate 56 bpm, respiratory rate 18 and O2 sat 97%.  While in the ED the heart rate went up to the 120s.  Patient was asymptomatic.  EKG confirmed slow ventricular tachycardia.  The ICD was not trigger.  He was started on IV amiodarone.  He eventually converted to sinus rhythm.  Labs showed: K 3.7, Gluc 106, BUN 27, Cr 1.72, WBC 7.0, Hct 33.0 and Plt 186.  High-sensitivity troponins were 29 and 29.  The BNP was 3663.  X-ray revealed cardiomegaly and small effusions in the posterior costophrenic angles.  Pertinent studies: Cardiac catheterization 11-11-20  Ost LAD to Prox LAD lesion is 100% stenosed. Patent LIMA to LAD.  Ost RCA to Prox RCA lesion is 100% stenosed. Patent SVG to RCA.  Mid Cx lesion is 100% stenosed. Patent jump SVG to OM2/OM3  1st Mrg lesion is 50% stenosed. This is a small vessel, unchanged from prior cath.  LV end diastolic pressure is mildly elevated. LVEDP 20 mm Hg.  There is no aortic valve stenosis.  Tortuous left subclavian. Versacore wire used to navigate. Continue medical therapy for systolic heart failure. Further management per EP for VT  Echocardiogram 10/24/2020 1. There is no left ventricular thrombus. Left  ventricular ejection  fraction, by estimation, is 20 to 25%. The left ventricle has severely  decreased function. The left ventricle demonstrates regional wall motion  abnormalities (see scoring  diagram/findings for description). The left ventricular internal cavity  size was moderately to severely dilated. Left ventricular diastolic  parameters are consistent with Grade II diastolic dysfunction  (pseudonormalization). Elevated left atrial  pressure. There is mild apical dyskinesis. There is akinesis of the  mid-apical anterior wall and septum and the inferior apex.  2. Right ventricular systolic function is mildly reduced. The right  ventricular size is normal. There is normal pulmonary artery systolic  pressure.  3. Left atrial size was severely dilated.  4. There is mitral leaflet malcoaptation due to adverse chamber  remodeling. Effective regurgitant orifice area 0.22 cm, regurgitant  volume 43 ml, regurgitant fraction 55%. The mitral valve is grossly  normal. Moderate to severe mitral valve  regurgitation. No evidence of mitral stenosis.  5. The aortic valve is tricuspid. Aortic valve regurgitation is mild.  Mild aortic valve sclerosis is present, with no evidence of aortic valve  stenosis.  6. The inferior vena cava is normal in size with greater than 50%  respiratory variability, suggesting right atrial pressure of 3 mmHg    Home Medications Prior to Admission medications   Medication Sig Start Date End Date Taking? Authorizing Provider  amiodarone (PACERONE) 400 MG tablet 400 mg twice a day for 2 weeks, then 400 mg daily for 2 weeks, then 200mg  daily thereafter Patient taking differently: Take 200-400 mg  by mouth See admin instructions. Takes 1 tablet (400 mg totally) twice a day for 2 weeks; then takes 1 tablet (400 mg totally) daily for 2 weeks; then 200mg  daily thereafter 10/25/20  Yes Almyra Deforest, PA  aspirin 81 MG EC tablet Take 81 mg by mouth daily.   Yes [provider]  atorvastatin (LIPITOR) 80 MG tablet TAKE 1 TABLET(80 MG) BY MOUTH DAILY AT 6 PM Patient taking differently: Take 80 mg by mouth every evening. 03/02/20  Yes Nahser, Wonda Cheng, MD  buPROPion (WELLBUTRIN XL) 300 MG 24 hr tablet Take 300 mg by mouth daily.   Yes [provider]  carvedilol (COREG) 12.5 MG tablet TAKE 1 TABLET(12.5 MG) BY MOUTH TWICE DAILY Patient taking differently: Take 12.5 mg by mouth in the morning and at bedtime. 03/11/20  Yes Nahser, Wonda Cheng, MD  doxycycline (VIBRA-TABS) 100 MG tablet Take 1 tablet (100 mg total) by mouth 2 (two) times daily. 11/13/20  Yes Hoyt Koch, MD  EPINEPHrine 0.3 mg/0.3 mL IJ SOAJ injection Inject 0.3 mLs (0.3 mg total) into the muscle as needed (anaphylaxis). Patient taking differently: Inject 0.3 mg into the muscle as needed for anaphylaxis (call 911). 06/17/18  Yes Deno Etienne, DO  escitalopram (LEXAPRO) 20 MG tablet Take 20 mg by mouth daily.   Yes [provider]  esomeprazole (NEXIUM) 20 MG packet Take 20 mg by mouth daily before breakfast.   Yes [provider]  HYDROcodone-homatropine (HYCODAN) 5-1.5 MG/5ML syrup Take 5 mLs by mouth every 8 (eight) hours as needed for up to 5 days for cough. 11/13/20 11/18/20 Yes Hoyt Koch, MD  Multiple Vitamins-Minerals (OCUVITE PRESERVISION) TABS Take 1 tablet by mouth daily.   Yes [provider]  nitroGLYCERIN (NITROSTAT) 0.4 MG SL tablet Place 1 tablet (0.4 mg total) under the tongue every 5 (five) minutes as needed for chest pain. 12/23/15  Yes Bhagat, Bhavinkumar, PA  sacubitril-valsartan (ENTRESTO) 24-26 MG Take 1 tablet by mouth 2 (two) times daily. With 30 day coupon Patient taking differently: Take 1 tablet by mouth 2 (two) times daily. 10/25/20  Yes Almyra Deforest, PA  zolpidem (AMBIEN) 10 MG tablet Take 1 tablet by mouth at bedtime. 02/28/14  Yes [provider]    Past Medical History: Past Medical History:  Diagnosis Date  .  Adult ADHD   . AICD (automatic cardioverter/defibrillator) present   . CAD (coronary artery disease)    Cath 2008, all grafts patent // Cath 2017 - all grafts patent   . 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 Carotid artery disease ([place])    Doppler, February, 2012, mild plaque, 0-39% bilateral, plan followup in one year  . Dyspepsia   . Ejection fraction < 50%   . Elevated PSA 02/15/2011  . Esophageal stricture   . GERD (gastroesophageal reflux disease)   . HTN (hypertension)   . Hx of CABG    1991  . Hyperlipidemia   . 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 Myocardial infarction ([place]) 1998  . Prostate cancer ([place]) 2012   IMRT, ADT  . Thrombosis of arm    Left  . Venous thrombosis    Left arm    Past Surgical History: Past Surgical History:  Procedure Laterality Date  . CARDIAC  CATHETERIZATION  12/05/06  . CARDIAC CATHETERIZATION N/A 12/21/2015   Procedure: Left Heart Cath and Coronary Angiography;  Surgeon: Runell Gess, MD;  Location: [facility] INVASIVE CV LAB;  Service: Cardiovascular;  Laterality: N/A;  . CORONARY ARTERY BYPASS GRAFT     LIMA_LAD, SVG-OM, Cx, RCA '91  . CYSTECTOMY     Hand  . 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 ICD Placement    . IMPLANTABLE CARDIOVERTER DEFIBRILLATOR (ICD) GENERATOR CHANGE  04/06/2015   Procedure: Icd Generator Change;  Surgeon: Marinus Maw, MD;  Location: [facility] INVASIVE CV LAB;  Service: Cardiovascular;;  . LEFT HEART CATH AND CORS/GRAFTS ANGIOGRAPHY N/A 10/23/2020   Procedure: LEFT HEART CATH AND CORS/GRAFTS ANGIOGRAPHY;  Surgeon: Corky Crafts, MD;  Location: [facility] INVASIVE CV LAB;  Service: Cardiovascular;  Laterality: N/A;  . menistectomies, left knee  '09  . ROTATOR CUFF REPAIR   2005, 2007  . Thumb Surgery    . TONSILLECTOMY  2005   Malignant lesion, w/XRT    Family History: Family History  Problem Relation Age of Onset  . Alzheimer's disease Mother   . Dementia Mother        living in [place]  . Hyperlipidemia Father   . Hypertension Father   . CAD Father        had pacemaker  . Congestive Heart Failure Father   . Heart disease Brother        Bypass surgery  . Breast cancer Sister        survivor  . Colon cancer Neg Hx   . Prostate cancer Neg Hx   . Diabetes Neg Hx     Social History: Social History   Socioeconomic History  . Marital status: Married    Spouse name: Not on file  . Number of children: 0  . Years of education: 45  . Highest education level: Not on file  Occupational History  . Occupation: Retired    Associate Professor: RETIRED    CommentChild psychotherapist for [employer]  Tobacco Use  . Smoking status: Never Smoker  . Smokeless tobacco: Never Used  Vaping Use  . Vaping Use: Never used  Substance and Sexual Activity  . Alcohol use: No  . Drug use: No  . Sexual activity: Yes    Birth control/protection: None  Other Topics Concern  . Not on file  Social History Narrative   [employer], [employer]. 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 No children. Marriage in good health, wife in good health. ACP discussed Jan '15: no CPR, no mechanical ventilation. He is referred to [email or web address] and provided with packet with HCPOA-his wife./ Advanced directive.   Fun/Hobby: Yard work, Education officer, environmental working.    Social Determinants of Health   Financial Resource Strain: Not on file  Food Insecurity: Not on file  Transportation Needs: Not on file  Physical Activity: Not on file  Stress: Not on file  Social Connections: Not on file    Allergies:  Allergies  Allergen Reactions  . Other Anaphylaxis    Insect bites (Has EpiPen)     Review of Systems: [y] = yes, [ ]  = no   . General: Weight gain [ ] ; Weight loss [ ] ; Anorexia [ ] ; Fatigue [ ] ; Fever [ ] ;  Chills [ ] ; Weakness [ ]   . Cardiac: Chest pain/pressure [ ] ; Resting SOB [Y]; Exertional SOB [Y]; Orthopnea [ ] ; Pedal Edema [ ] ; Palpitations [ ] ;  Syncope [ ] ; Presyncope [ ] ; Paroxysmal nocturnal dyspnea[ ]   . Pulmonary: Cough [Y]; Wheezing[ ] ; Hemoptysis[ ] ; Sputum [ ] ; Snoring [ ]   . GI: Vomiting[ ] ; Dysphagia[ ] ; Melena[ ] ; Hematochezia [ ] ; Heartburn[ ] ; Abdominal pain [ ] ; Constipation [ ] ; Diarrhea [ ] ; BRBPR [ ]   . GU: Hematuria[ ] ; Dysuria [ ] ; Nocturia[ ]   . Vascular: Pain in legs with walking [ ] ; Pain in feet with lying flat [ ] ; Non-healing sores [ ] ; Stroke [ ] ; TIA [ ] ; Slurred speech [ ] ;  . Neuro: Headaches[ ] ; Vertigo[ ] ; Seizures[ ] ; Paresthesias[ ] ;Blurred vision [ ] ; Diplopia [ ] ; Vision changes [ ]   . Ortho/Skin: Arthritis [ ] ; Joint pain [ ] ; Muscle pain [ ] ; Joint swelling [ ] ; Back Pain [ ] ; Rash [ ]   . Psych: Depression[ ] ; Anxiety[ ]   . Heme: Bleeding problems [ ] ; Clotting disorders [ ] ; Anemia [ ]   . Endocrine: Diabetes [ ] ; Thyroid dysfunction[ ]      Objective:    Vital Signs:   Temp:  [97.6 F (36.4 C)-98.4 F (36.9 C)] 97.6 F (36.4 C) (12/21 1959) Pulse Rate:  [56-115] 70 (12/22 0415) Resp:  [8-29] 14 (12/22 0415) BP: (117-144)/(74-94) 139/74 (12/22 0415) SpO2:  [93 %-100 %] 93 % (12/22 0415) Weight:  [83.9 kg] 83.9 kg (12/21 1653)    Weight change: Filed Weights   11/17/20 1653  Weight: 83.9 kg    Intake/Output:   Intake/Output Summary (Last 24 hours) at 11/18/2020 0433 Last data filed at 11/18/2020 0013 Gross per 24 hour  Intake --  Output 700 ml  Net -700 ml      Physical Exam    General:  Well appearing.  Not in distress HEENT: normal Neck: Elevated JVP . Carotids 2+ bilat; no bruits. No lymphadenopathy or thyromegaly appreciated. Cor: PMI nondisplaced. Regular rate & rhythm. No rubs, gallops or murmurs. Lungs: Mild bibasilar rales Abdomen: soft, nontender, nondistended. No hepatosplenomegaly. No bruits or masses. Good bowel  sounds. Extremities: no cyanosis, clubbing, rash, edema Neuro: alert & orientedx3, cranial nerves grossly intact. moves all 4 extremities w/o difficulty. Affect pleasant   Labs   Basic Metabolic Panel: Recent Labs  Lab 11/17/20 1644 11/18/20 0257  NA 140 140  K 3.7 3.1*  CL 107 106  CO2 22 19*  GLUCOSE 106* 94  BUN 27* 26*  CREATININE 1.72* 1.56*  CALCIUM 8.5* 8.3*  MG  --  1.7    Liver Function Tests: No results for input(s): AST, ALT, ALKPHOS, BILITOT, PROT, ALBUMIN in the last 168 hours. No results for input(s): LIPASE, AMYLASE in the last 168 hours. No results for input(s): AMMONIA in the last 168 hours.  CBC: Recent Labs  Lab 11/17/20 1644  WBC 7.0  HGB 10.3*  HCT 33.9*  MCV 88.5  PLT 186    Cardiac Enzymes: No results for input(s): CKTOTAL, CKMB, CKMBINDEX, TROPONINI in the last 168 hours.  BNP: BNP (last 3 results) Recent Labs    11/17/20 1645  BNP 3,663.4*    ProBNP (last 3 results) No results for input(s): PROBNP in the last 8760 hours.   CBG: No results for input(s): GLUCAP in the last 168 hours.  Coagulation Studies: No results for input(s): LABPROT, INR in the last 72 hours.   Imaging   DG Chest 2 View  Result Date: 11/17/2020 CLINICAL DATA:  Shortness of breath over the last day. History of congestive heart failure. EXAM: CHEST - 2 VIEW COMPARISON:  10/23/2020 FINDINGS: Previous median sternotomy and CABG. Pacemaker/AICD in place. The heart is chronically enlarged. Chronic aortic atherosclerosis. There is pulmonary venous hypertension but no frank edema. Small effusions in the posterior costophrenic angles. No focal finding otherwise. IMPRESSION: Congestive heart failure. Cardiomegaly. Pulmonary venous hypertension. Small effusions in the posterior costophrenic angles. Electronically Signed   By: Nelson Chimes M.D.   On: 11/17/2020 17:06      Medications:     Current Medications: . amiodarone  400 mg Oral Daily  . aspirin EC  81  mg Oral Daily  . atorvastatin  80 mg Oral QPM  . buPROPion  300 mg Oral Daily  . carvedilol  12.5 mg Oral BID WC  . escitalopram  20 mg Oral Daily  . furosemide  20 mg Intravenous Once  . heparin  5,000 Units Subcutaneous Q8H  . multivitamin  1 tablet Oral Daily  . pantoprazole  40 mg Oral QAC breakfast     Infusions: . amiodarone 60 mg/hr (11/18/20 0249)  . amiodarone         Assessment/Plan   1. Episodes of ventricular tachycardia  -Amiodarone IV bolus and infusion -Monitor on telemetry -Maintain serum potassium >4.0 and magnesium >2.0   2. Systolic heart failure  -Strict I and Os -Daily weights -IV Lasix as needed -Continue carvedilol, and Entresto  3. Coronary artery disease with prior CABG  -Trend cardiac biomarkers -Serial ECGs -Aspirin 81 mg daily -Continue atorvastatin -Continue beta-blockers      Meade Maw, MD  11/18/2020, 4:33 AM  Cardiology Overnight Team Please contact [facility] for night-coverage after hours (4p -7a ) and weekends on [email or web address]

## 2020-11-19 LAB — CBC
HCT: 33 % — ABNORMAL LOW (ref 39.0–52.0)
Hemoglobin: 10.6 g/dL — ABNORMAL LOW (ref 13.0–17.0)
MCH: 27.5 pg (ref 26.0–34.0)
MCHC: 32.1 g/dL (ref 30.0–36.0)
MCV: 85.5 fL (ref 80.0–100.0)
Platelets: 189 K/uL (ref 150–400)
RBC: 3.86 MIL/uL — ABNORMAL LOW (ref 4.22–5.81)
RDW: 15 % (ref 11.5–15.5)
WBC: 7.2 K/uL (ref 4.0–10.5)
nRBC: 0 % (ref 0.0–0.2)

## 2020-11-19 LAB — PROCALCITONIN: Procalcitonin: <0.1 ng/mL

## 2020-11-19 LAB — BASIC METABOLIC PANEL
Anion gap: 12 (ref 5–15)
BUN: 28 mg/dL — ABNORMAL HIGH (ref 8–23)
CO2: 19 mmol/L — ABNORMAL LOW (ref 22–32)
Calcium: 8.2 mg/dL — ABNORMAL LOW (ref 8.9–10.3)
Chloride: 104 mmol/L (ref 98–111)
Creatinine, Ser: 1.74 mg/dL — ABNORMAL HIGH (ref 0.61–1.24)
GFR, Estimated: 41 mL/min — ABNORMAL LOW (ref >60)
Glucose, Bld: 126 mg/dL — ABNORMAL HIGH (ref 70–99)
Potassium: 4 mmol/L (ref 3.5–5.1)
Sodium: 135 mmol/L (ref 135–145)

## 2020-11-19 LAB — BASIC METABOLIC PANEL WITH GFR
Anion gap: 11 (ref 5–15)
BUN: 26 mg/dL — ABNORMAL HIGH (ref 8–23)
CO2: 23 mmol/L (ref 22–32)
Calcium: 8.5 mg/dL — ABNORMAL LOW (ref 8.9–10.3)
Chloride: 105 mmol/L (ref 98–111)
Creatinine, Ser: 1.72 mg/dL — ABNORMAL HIGH (ref 0.61–1.24)
GFR, Estimated: 41 mL/min — ABNORMAL LOW
Glucose, Bld: 118 mg/dL — ABNORMAL HIGH (ref 70–99)
Potassium: 4.7 mmol/L (ref 3.5–5.1)
Sodium: 139 mmol/L (ref 135–145)

## 2020-11-19 LAB — MAGNESIUM: Magnesium: 2 mg/dL (ref 1.7–2.4)

## 2020-11-19 MED ORDER — AMIODARONE HCL 200 MG PO TABS
400.0000 mg | ORAL_TABLET | Freq: Every day | ORAL | Status: DC
  Administered 2020-11-19: 400 mg via ORAL
  Filled 2020-11-19: qty 2

## 2020-11-19 MED ORDER — AMIODARONE HCL 200 MG PO TABS
400.0000 mg | ORAL_TABLET | Freq: Two times a day (BID) | ORAL | Status: DC
  Administered 2020-11-19 – 2020-11-27 (×16): 400 mg via ORAL
  Filled 2020-11-19 (×16): qty 2

## 2020-11-19 MED ORDER — MEXILETINE HCL 150 MG PO CAPS
150.0000 mg | ORAL_CAPSULE | Freq: Three times a day (TID) | ORAL | Status: DC
  Administered 2020-11-19 – 2020-12-05 (×48): 150 mg via ORAL
  Filled 2020-11-19 (×51): qty 1

## 2020-11-19 MED ORDER — AMIODARONE HCL IN DEXTROSE 360-4.14 MG/200ML-% IV SOLN
30.0000 mg/h | INTRAVENOUS | Status: DC
  Filled 2020-11-19: qty 200

## 2020-11-19 MED ORDER — AMIODARONE LOAD VIA INFUSION: 150.0000 mg | Freq: Once | INTRAVENOUS | Status: DC

## 2020-11-19 MED ORDER — ZOLPIDEM TARTRATE 5 MG PO TABS
5.0000 mg | ORAL_TABLET | Freq: Every day | ORAL | Status: DC
  Administered 2020-11-19 – 2020-12-04 (×16): 5 mg via ORAL
  Filled 2020-11-19 (×16): qty 1

## 2020-11-19 NOTE — Progress Notes (Addendum)
Progress Note  Patient Name: [name] Date of Encounter: 11/19/2020  [facility] Cardiologist: Mertie Moores, MD   Subjective   Pt reports breathing has not improved, dyspnea began prior to admission. Reds Clip yesterday showed no evidence of volume overload.  Inpatient Medications    Scheduled Meds: . aspirin EC  81 mg Oral Daily  . atorvastatin  80 mg Oral QPM  . buPROPion  300 mg Oral Daily  . carvedilol  12.5 mg Oral BID WC  . escitalopram  20 mg Oral Daily  . heparin  5,000 Units Subcutaneous Q8H  . multivitamin  1 tablet Oral Daily  . pantoprazole  40 mg Oral QAC breakfast  . potassium chloride  20 mEq Oral Daily  . sacubitril-valsartan  1 tablet Oral BID   Continuous Infusions: . amiodarone     PRN Meds: acetaminophen **OR** acetaminophen, guaiFENesin-dextromethorphan, ondansetron (ZOFRAN) IV   Vital Signs    Vitals:   11/19/20 0747 11/19/20 0900 11/19/20 0935 11/19/20 0937  BP: 130/85 135/80 124/78 124/78  Pulse: 60  (!) 57   Resp: 18     Temp: 97.9 F (36.6 C)     TempSrc: Oral     SpO2: 100%     Weight:      Height:        Intake/Output Summary (Last 24 hours) at 11/19/2020 0944 Last data filed at 11/19/2020 0850 Gross per 24 hour  Intake 960 ml  Output 300 ml  Net 660 ml   Last 3 Weights 11/19/2020 11/17/2020 10/25/2020  Weight (lbs) 185 lb 13.6 oz 185 lb 174 lb 9.6 oz  Weight (kg) 84.3 kg 83.915 kg 79.198 kg      Telemetry    Another bout of VT this morning with rates 110s, now in sinus bradycardia in the 50s - Personally Reviewed  ECG    No new tracings - Personally Reviewed  Physical Exam   GEN: No acute distress.   Neck: minimal  JVD Cardiac: RRR, no murmurs, rubs, or gallops.  Respiratory: crackles in right base, respirations unlabored GI: Soft, nontender, non-distended  MS: No edema; No deformity. Neuro:  Nonfocal  Psych: Normal affect   Labs    High Sensitivity Troponin:   Recent Labs  Lab  10/23/20 1044 10/23/20 1717 11/17/20 1644 11/17/20 1828  TROPONINIHS 55* 84* 29* 29*      Chemistry Recent Labs  Lab 11/17/20 1644 11/18/20 0257 11/19/20 0301  NA 140 140 139  K 3.7 3.1* 4.7  CL 107 106 105  CO2 22 19* 23  GLUCOSE 106* 94 118*  BUN 27* 26* 26*  CREATININE 1.72* 1.56* 1.72*  CALCIUM 8.5* 8.3* 8.5*  GFRNONAA 41* 47* 41*  ANIONGAP 11 15 11      Hematology Recent Labs  Lab 11/17/20 1644 11/19/20 0301  WBC 7.0 7.2  RBC 3.83* 3.86*  HGB 10.3* 10.6*  HCT 33.9* 33.0*  MCV 88.5 85.5  MCH 26.9 27.5  MCHC 30.4 32.1  RDW 15.0 15.0  PLT 186 189    BNP Recent Labs  Lab 11/17/20 1645  BNP 3,663.4*     DDimer No results for input(s): DDIMER in the last 168 hours.   Radiology    DG Chest 2 View  Result Date: 11/17/2020 CLINICAL DATA:  Shortness of breath over the last day. History of congestive heart failure. EXAM: CHEST - 2 VIEW COMPARISON:  10/23/2020 FINDINGS: Previous median sternotomy and CABG. Pacemaker/AICD in place. The heart is chronically enlarged. Chronic aortic  atherosclerosis. There is pulmonary venous hypertension but no frank edema. Small effusions in the posterior costophrenic angles. No focal finding otherwise. IMPRESSION: Congestive heart failure. Cardiomegaly. Pulmonary venous hypertension. Small effusions in the posterior costophrenic angles. Electronically Signed   By: Nelson Chimes M.D.   On: 11/17/2020 17:06    Cardiac Studies   Cardiac catheterization 10/23/2020  Ost LAD to Prox LAD lesion is 100% stenosed. Patent LIMA to LAD.  Ost RCA to Prox RCA lesion is 100% stenosed. Patent SVG to RCA.  Mid Cx lesion is 100% stenosed. Patent jump SVG to OM2/OM3  1st Mrg lesion is 50% stenosed. This is a small vessel, unchanged from prior cath.  LV end diastolic pressure is mildly elevated. LVEDP 20 mm Hg.  There is no aortic valve stenosis.  Tortuous left subclavian. Versacore wire used to navigate. Continue medical therapy for  systolic heart failure. Further management per EP for VT   Echocardiogram 10/24/2020 1. There is no left ventricular thrombus. Left ventricular ejection  fraction, by estimation, is 20 to 25%. The left ventricle has severely  decreased function. The left ventricle demonstrates regional wall motion  abnormalities (see scoring  diagram/findings for description). The left ventricular internal cavity  size was moderately to severely dilated. Left ventricular diastolic  parameters are consistent with Grade II diastolic dysfunction  (pseudonormalization). Elevated left atrial  pressure. There is mild apical dyskinesis. There is akinesis of the  mid-apical anterior wall and septum and the inferior apex.  2. Right ventricular systolic function is mildly reduced. The right  ventricular size is normal. There is normal pulmonary artery systolic  pressure.  3. Left atrial size was severely dilated.  4. There is mitral leaflet malcoaptation due to adverse chamber  remodeling. Effective regurgitant orifice area 0.22 cm, regurgitant  volume 43 ml, regurgitant fraction 55%. The mitral valve is grossly  normal. Moderate to severe mitral valve  regurgitation. No evidence of mitral stenosis.  5. The aortic valve is tricuspid. Aortic valve regurgitation is mild.  Mild aortic valve sclerosis is present, with no evidence of aortic valve  stenosis.  6. The inferior vena cava is normal in size with greater than 50%  respiratory variability, suggesting right atrial pressure of 3 mmHg  Patient Profile     74 y.o. male with CAD (hx of CABG, with patent grafts on last cath), ischemic cardiomyopathy (EF 20-25%, by echo), ICD placement, episodes of ventricular tachycardia, HTN, HL, CKD stage III, and prostate CA, who presented to the hospital with complaints of shortness of breath.  Assessment & Plan    Systolic heart failure - recent echo with EF 20-25% - BNP 3663 - CXR with signs of pulmonary  venous hypertension but no frank edema - continued on entresto 24-26 mg BID - Reds Clip yesterday did not show he is significantly volume up - clinically with occasional crackle in right base - can consider one dose of IV lasix - will discuss with Dr. Acie Fredrickson   Dyspnea  - consider repeating another CXR to rule out infectious process - unclear that his dyspnea is primarily related to heart failure given Reds Clip yesterday   CAD s/p CABG Hyperlipidemia with LDL goal < 70 - recent heart cath showed patent grafts - no chest pain - continue ASA, BB, statin 10/24/2020: Cholesterol 134; HDL 39; LDL Cholesterol 81; Triglycerides 68; VLDL 14 - consider adding [id number]   VT - recurrence this morning after amio gtt paused - now back on amiodarone gtt with mexiletine added  -  appreciate management by EP      For questions or updates, please contact [place] Please consult [email or web address] for contact info under        Signed, Ledora Bottcher, PA  11/19/2020, 9:44 AM    Attending Note:   The patient was seen and examined.  Agree with assessment and plan as noted above.  Changes made to the above note as needed.  Patient seen and independently examined with Doreene Adas, PA .   We discussed all aspects of the encounter. I agree with the assessment and plan as stated above.  1.  CHF:   RADS vest shows his volume status is ok.   Lungs are clear, no peripheral edema .   Cont current meds.  Cont entresto  2.   Ventricular tachycardia.   EP has seen . mexilitine has been added.   Rhythm seems to be better .   3.  CAD :  S/p CABG    I have spent a total of 40 minutes with patient reviewing hospital  notes , telemetry, EKGs, labs and examining patient as well as establishing an assessment and plan that was discussed with the patient. > 50% of time was spent in direct patient care.     Thayer Headings, Brooke Bonito., MD, [facility] 11/19/2020, 3:24 PM [address]  [place] Pager [phone]

## 2020-11-19 NOTE — Progress Notes (Signed)
Patient did not get started on amiodarone gtt. In d/w pharmacy, was explained that Epic auto defaults start time 6 hours later when choosing 30mg /hr rate alone. The patient has not had further VT, given start of mexiletine, will go ahead and continue with PO amiodarone.  Tommye Standard, PA-C

## 2020-11-19 NOTE — Progress Notes (Signed)
  ReDS Clip Diuretic Study Pt study # [id number]  Your patient has been enrolled in the ReDS Clip Diuretic Study  SCr back at 1.74, weight remains unchanged at 185 lbs. No lasix received yesterday. Uop -500 mL/24 hr. Not taking PTA diuretics.   Changes to prescribed diuretics recommended:  Continue to hold diuretics today - could consider PO diuretics if s/sx of edema/orthopnea for discharge.   Provider contacted: Dr. Acie Fredrickson Recommendation was accepted by provider.    REDS Clip  READING= 32%  CHEST RULER = 34 Clip Station = D   Orthodema score = 0 Signs/Symptoms Score   Mild edema, no orthopnea 0 No congestion  Moderate edema, no orthopnea 1 Low-grade orthodema/congestion  Severe edema OR orthopnea 2   Moderate edema and orthopnea 3 High-grade orthodema/congestion  Severe edema AND orthopnea 4    Antonietta Jewel, PharmD, BCCCP Clinical Pharmacist  Phone: [phone] 11/19/2020 1:04 PM  Please check AMION for all [place] phone numbers After 10:00 PM, call [place]

## 2020-11-19 NOTE — Progress Notes (Addendum)
PROGRESS NOTE    [name]  [id number] DOB: 12/24/1945 DOA: 11/17/2020 PCP: Hoyt Koch, MD   Brief Narrative:  [name] is a 74 y.o. male with medical history significant of CAD status post CABG and PCI, chronic combined systolic and diastolic CHF/ischemic cardiomyopathy with LVEF 20-25% and history of VT status post ICD, hypertension, hyperlipidemia, CKD stage IIIa, history of prostate and tonsillar cancer, history of DVT presenting to the ED with complaints of shortness of breath.  Patient reports dyspnea on exertion and cough for the past 3 to 4 days.  He was having low-grade fevers earlier but has not had them for the past few days.  He has also had 3 episodes of vomiting over the past week which has now resolved.  Denies abdominal pain or diarrhea.  States his ICD had shocked him prior to his recent hospitalization but he has not had any issues lately.  He is vaccinated against Covid.  States he has been checked for Covid multiple times recently and every time results came back negative.  ED Course: Afebrile.  Tachypneic with respiratory rate in the 20s.  Not tachycardic.  Not hypoxic.  WBC 7.0, hemoglobin 10.3, hematocrit 33.9, platelet 186K.  Sodium 140, potassium 3.7, chloride 107, bicarb 22, BUN 27, creatinine 1.7, glucose 106.  High-sensitivity troponin mildly elevated but stable (29 >29).  BNP significantly elevated at 3663.  SARS-CoV-2 PCR test and influenza panel both negative.  Chest x-ray showing cardiomegaly and small effusions in the posterior costophrenic angles; no frank edema.Patient was given IV Lasix 20 mg.  Assessment & Plan:  Acute on chronic combined systolic and diastolic CHF: -Patient presented with signs of fluid overload.  Chest x-ray shows cardiomegaly and small effusion in the posterior costophrenic angles.  -On admission: Has bibasilar Rales, JVD and significantly elevated BNP of 3663. -Echo from 10/24/2020 showing severely  reduced ejection fraction of 20 to [id number], grade 2 diastolic dysfunction, moderate to severe MR. -Received Lasix 20 g IV once in ED. continue Lasix -Strict INO's and daily weight.  Monitor signs of fluid overload.  Monitor electrolytes closely. -Keep potassium more than 4, magnesium more than 2.    V. tach status post ICD: -Patient developed recurrent V. tach lasted for 15 minutes shortly after amiodarone GTT stopped. Heart rate: In 110s. Patient remained asymptomatic. -Amiodarone 150 mg bolus ordered-rhythm converted back to sinus bradycardia in 50s. -Mexiletine added by EP team -Appreciate cardiology and EP team assistance  AKI on CKD stage IIIa: -Kidney function slightly declined this morning. Need went up from 1.56-1.72 GFR declined from 47-41. -Could be secondary to cardiorenal in the setting of acutely decompensated CHF versus dehydration. -Continue to monitor kidney function closely. Continue losartan and Entresto as per cardiology recommendations. -Repeat BMP tomorrow a.m.  History of coronary artery disease status post CABG and PCI: Patient denies ACS symptoms.  Troponin mildly elevated but stable 29x2.  Suspected demand ischemia from decompensated CHF. -Continue aspirin, beta-blocker and statin  Hypertension: Stable.  Continue Coreg and Entresto. Monitor blood pressure closely.  Hyperlipidemia: Continue Lipitor  GERD: Continue PPI  Hypokalemia and hypomagnesemia: Replenished. Resolved  Depression/anxiety: Continue Lexapro, Wellbutrin and Ambien  DVT prophylaxis: Heparin Code Status: None Family Communication: None present at bedside.  Plan of care discussed with patient in length and he verbalized understanding and agreed with it. Disposition Plan: Likely home in 1 to 2 days  Consultants:   Cardiology  Procedures:   None  Antimicrobials:   None  Status  is: Inpatient   Dispo: The patient is from: Home              Anticipated d/c is to: Home               Anticipated d/c date is: 2 days              Patient currently is not medically stable to d/c.         Subjective: Patient seen and examined. Tells me that he continues to have shortness of breath however denies active chest pain, palpitation, leg swelling, orthopnea, PND, fever, chills, nausea or vomiting. Tells me that he feels the same as yesterday. Requested to resume his Ambien at bedtime.  Objective: Vitals:   11/19/20 0747 11/19/20 0900 11/19/20 0935 11/19/20 0937  BP: 130/85 135/80 124/78 124/78  Pulse: 60  (!) 57   Resp: 18     Temp: 97.9 F (36.6 C)     TempSrc: Oral     SpO2: 100%     Weight:      Height:        Intake/Output Summary (Last 24 hours) at 11/19/2020 0958 Last data filed at 11/19/2020 0850 Gross per 24 hour  Intake 960 ml  Output 300 ml  Net 660 ml   Filed Weights   11/17/20 1653 11/19/20 0422  Weight: 83.9 kg 84.3 kg    Examination:  General exam: Appears calm and comfortable, on nasal cannula, communicating well Respiratory system: Clear to auscultation. Respiratory effort normal. Cardiovascular system: S1 & S2 heard, RRR. No JVD, murmurs, rubs, gallops or clicks. No pedal edema. Gastrointestinal system: Abdomen is nondistended, soft and nontender. No organomegaly or masses felt. Normal bowel sounds heard. Central nervous system: Alert and oriented. No focal neurological deficits. Extremities: Symmetric 5 x 5 power. Skin: No rashes, lesions or ulcers Psychiatry: Judgement and insight appear normal. Mood & affect appropriate.    Data Reviewed: I have personally reviewed following labs and imaging studies  CBC: Recent Labs  Lab 11/17/20 1644 11/19/20 0301  WBC 7.0 7.2  HGB 10.3* 10.6*  HCT 33.9* 33.0*  MCV 88.5 85.5  PLT 186 202   Basic Metabolic Panel: Recent Labs  Lab 11/17/20 1644 11/18/20 0257 11/19/20 0301  NA 140 140 139  K 3.7 3.1* 4.7  CL 107 106 105  CO2 22 19* 23  GLUCOSE 106* 94 118*  BUN 27* 26* 26*   CREATININE 1.72* 1.56* 1.72*  CALCIUM 8.5* 8.3* 8.5*  MG  --  1.7 2.0   GFR: Estimated Creatinine Clearance: 42 mL/min (A) (by C-G formula based on SCr of 1.72 mg/dL (H)). Liver Function Tests: No results for input(s): AST, ALT, ALKPHOS, BILITOT, PROT, ALBUMIN in the last 168 hours. No results for input(s): LIPASE, AMYLASE in the last 168 hours. No results for input(s): AMMONIA in the last 168 hours. Coagulation Profile: No results for input(s): INR, PROTIME in the last 168 hours. Cardiac Enzymes: No results for input(s): CKTOTAL, CKMB, CKMBINDEX, TROPONINI in the last 168 hours. BNP (last 3 results) No results for input(s): PROBNP in the last 8760 hours. HbA1C: No results for input(s): HGBA1C in the last 72 hours. CBG: No results for input(s): GLUCAP in the last 168 hours. Lipid Profile: No results for input(s): CHOL, HDL, LDLCALC, TRIG, CHOLHDL, LDLDIRECT in the last 72 hours. Thyroid Function Tests: No results for input(s): TSH, T4TOTAL, FREET4, T3FREE, THYROIDAB in the last 72 hours. Anemia Panel: No results for input(s): VITAMINB12, FOLATE, FERRITIN, TIBC,  IRON, RETICCTPCT in the last 72 hours. Sepsis Labs: Recent Labs  Lab 11/18/20 1254 11/19/20 0301  PROCALCITON 0.10 <0.10    Recent Results (from the past 240 hour(s))  Novel Coronavirus, NAA ([facility])     Status: None   Collection Time: 11/13/20 10:09 AM   Specimen: Nasopharyngeal(NP) swabs in vial transport medium   Nasopharynge  Result Value Ref Range Status   SARS-CoV-2, NAA Not Detected Not Detected Final    Comment: This nucleic acid amplification test was developed and its performance characteristics determined by [employer]. Nucleic acid amplification tests include RT-PCR and TMA. This test has not been FDA cleared or approved. This test has been authorized by FDA under an Emergency Use Authorization (EUA). This test is only authorized for the duration of time the declaration that circumstances  exist justifying the authorization of the emergency use of in vitro diagnostic tests for detection of SARS-CoV-2 virus and/or diagnosis of COVID-19 infection under section 564(b)(1) of the Act, 21 U.S.C. [id number]) (1), unless the authorization is terminated or revoked sooner. When diagnostic testing is negative, the possibility of a false negative result should be considered in the context of a patient's recent exposures and the presence of clinical signs and symptoms consistent with COVID-19. An individual without symptoms of COVID-19 and who is not shedding SARS-CoV-2 virus wo uld expect to have a negative (not detected) result in this assay.   SARS-COV-2, NAA 2 DAY TAT     Status: None   Collection Time: 11/13/20 10:09 AM   Nasopharynge  Result Value Ref Range Status   SARS-CoV-2, NAA 2 DAY TAT Performed  Final  Resp Panel by RT-PCR (Flu A&B, Covid) Nasopharyngeal Swab     Status: None   Collection Time: 11/17/20 10:35 PM   Specimen: Nasopharyngeal Swab; Nasopharyngeal(NP) swabs in vial transport medium  Result Value Ref Range Status   SARS Coronavirus 2 by RT PCR NEGATIVE NEGATIVE Final    Comment: (NOTE) SARS-CoV-2 target nucleic acids are NOT DETECTED.  The SARS-CoV-2 RNA is generally detectable in upper respiratory specimens during the acute phase of infection. The lowest concentration of SARS-CoV-2 viral copies this assay can detect is 138 copies/mL. A negative result does not preclude SARS-Cov-2 infection and should not be used as the sole basis for treatment or other patient management decisions. A negative result may occur with  improper specimen collection/handling, submission of specimen other than nasopharyngeal swab, presence of viral mutation(s) within the areas targeted by this assay, and inadequate number of viral copies(<138 copies/mL). A negative result must be combined with clinical observations, patient history, and epidemiological information. The  expected result is Negative.  Fact Sheet for Patients:  EntrepreneurPulse.com.au  Fact Sheet for Healthcare Providers:  IncredibleEmployment.be  This test is no t yet approved or cleared by the [place] FDA and  has been authorized for detection and/or diagnosis of SARS-CoV-2 by FDA under an Emergency Use Authorization (EUA). This EUA will remain  in effect (meaning this test can be used) for the duration of the COVID-19 declaration under Section 564(b)(1) of the Act, 21 U.S.C.section 360bbb-3(b)(1), unless the authorization is terminated  or revoked sooner.       Influenza A by PCR NEGATIVE NEGATIVE Final   Influenza B by PCR NEGATIVE NEGATIVE Final    Comment: (NOTE) The Xpert Xpress SARS-CoV-2/FLU/RSV plus assay is intended as an aid in the diagnosis of influenza from Nasopharyngeal swab specimens and should not be used as a sole basis for treatment. Nasal washings  and aspirates are unacceptable for Xpert Xpress SARS-CoV-2/FLU/RSV testing.  Fact Sheet for Patients: EntrepreneurPulse.com.au  Fact Sheet for Healthcare Providers: IncredibleEmployment.be  This test is not yet approved or cleared by the [place] FDA and has been authorized for detection and/or diagnosis of SARS-CoV-2 by FDA under an Emergency Use Authorization (EUA). This EUA will remain in effect (meaning this test can be used) for the duration of the COVID-19 declaration under Section 564(b)(1) of the Act, 21 U.S.C. section 360bbb-3(b)(1), unless the authorization is terminated or revoked.  Performed at [facility], [place] [address] [place], [place] [address]       Radiology Studies: DG Chest 2 View  Result Date: 11/17/2020 CLINICAL DATA:  Shortness of breath over the last day. History of congestive heart failure. EXAM: CHEST - 2 VIEW COMPARISON:  10/23/2020 FINDINGS: Previous median sternotomy and CABG.  Pacemaker/AICD in place. The heart is chronically enlarged. Chronic aortic atherosclerosis. There is pulmonary venous hypertension but no frank edema. Small effusions in the posterior costophrenic angles. No focal finding otherwise. IMPRESSION: Congestive heart failure. Cardiomegaly. Pulmonary venous hypertension. Small effusions in the posterior costophrenic angles. Electronically Signed   By: Nelson Chimes M.D.   On: 11/17/2020 17:06    Scheduled Meds: . aspirin EC  81 mg Oral Daily  . atorvastatin  80 mg Oral QPM  . buPROPion  300 mg Oral Daily  . carvedilol  12.5 mg Oral BID WC  . escitalopram  20 mg Oral Daily  . heparin  5,000 Units Subcutaneous Q8H  . mexiletine  150 mg Oral Q8H  . multivitamin  1 tablet Oral Daily  . pantoprazole  40 mg Oral QAC breakfast  . potassium chloride  20 mEq Oral Daily  . sacubitril-valsartan  1 tablet Oral BID   Continuous Infusions: . amiodarone       LOS: 2 days   Time spent: 40 minutes  Peter Babler Loann Quill, MD [facility]  If 7PM-7AM, please contact night-coverage [email or web address] 11/19/2020, 9:58 AM

## 2020-11-19 NOTE — Progress Notes (Signed)
   11/19/20 1248  Clinical Encounter Type  Visited With Other (Comment)  Visit Type Other (Comment)  Referral From Nurse  Consult/Referral To Chaplain  Nurse (Tran)called stating patient is requesting priest not chaplain to visit. Advised we do not have priest on staff. Advised patient is welcome to have his priest from [place] to visit. Advised chaplain is available if needed. This note was prepared by Jeanine Luz, M.Div..  For questions please contact by phone [phone].

## 2020-11-19 NOTE — Progress Notes (Signed)
Patient has sustained VT, notified Tommye Standard PA, EP. She gave verbal order of 150 mg amiodarone IV bolus.  Gave 150 mg IV bolus, and continue to monitor patient

## 2020-11-19 NOTE — Progress Notes (Addendum)
Progress Note  Patient Name: [name] Date of Encounter: 11/19/2020  [facility] Cardiologist: Mertie Moores, MD   Subjective   Still gets SOB easily, no CP, palpitations  Inpatient Medications    Scheduled Meds: . aspirin EC  81 mg Oral Daily  . atorvastatin  80 mg Oral QPM  . buPROPion  300 mg Oral Daily  . carvedilol  12.5 mg Oral BID WC  . escitalopram  20 mg Oral Daily  . heparin  5,000 Units Subcutaneous Q8H  . multivitamin  1 tablet Oral Daily  . pantoprazole  40 mg Oral QAC breakfast  . potassium chloride  20 mEq Oral Daily  . sacubitril-valsartan  1 tablet Oral BID   Continuous Infusions: . amiodarone 30 mg/hr (11/18/20 2149)   PRN Meds: acetaminophen **OR** acetaminophen, guaiFENesin-dextromethorphan, ondansetron (ZOFRAN) IV   Vital Signs    Vitals:   11/18/20 2158 11/19/20 0050 11/19/20 0422 11/19/20 0747  BP: 114/78 108/78  130/85  Pulse: (!) 52 (!) 53  60  Resp: (!) 24   18  Temp: 97.9 F (36.6 C) 98.4 F (36.9 C)  97.9 F (36.6 C)  TempSrc: Oral Oral  Oral  SpO2: 98% 96%  100%  Weight:   84.3 kg   Height:        Intake/Output Summary (Last 24 hours) at 11/19/2020 0819 Last data filed at 11/19/2020 0747 Gross per 24 hour  Intake 600 ml  Output 700 ml  Net -100 ml   Last 3 Weights 11/19/2020 11/17/2020 10/25/2020  Weight (lbs) 185 lb 13.6 oz 185 lb 174 lb 9.6 oz  Weight (kg) 84.3 kg 83.915 kg 79.198 kg      Telemetry    SB 50's - Personally Reviewed  ECG    No new EKGs - Personally Reviewed  Physical Exam   GEN: No acute distress.   Neck: No JVD Cardiac: RRR, 1-2/6 SM, rubs, or gallops.  Respiratory: CTA b/l. GI: Soft, nontender, non-distended  MS: No edema; No deformity. Neuro:  Nonfocal  Psych: Normal affect   Labs    High Sensitivity Troponin:   Recent Labs  Lab 10/23/20 1044 10/23/20 1717 11/17/20 1644 11/17/20 1828  TROPONINIHS 55* 84* 29* 29*      Chemistry Recent Labs  Lab 11/17/20 1644  11/18/20 0257 11/19/20 0301  NA 140 140 139  K 3.7 3.1* 4.7  CL 107 106 105  CO2 22 19* 23  GLUCOSE 106* 94 118*  BUN 27* 26* 26*  CREATININE 1.72* 1.56* 1.72*  CALCIUM 8.5* 8.3* 8.5*  GFRNONAA 41* 47* 41*  ANIONGAP 11 15 11      Hematology Recent Labs  Lab 11/17/20 1644 11/19/20 0301  WBC 7.0 7.2  RBC 3.83* 3.86*  HGB 10.3* 10.6*  HCT 33.9* 33.0*  MCV 88.5 85.5  MCH 26.9 27.5  MCHC 30.4 32.1  RDW 15.0 15.0  PLT 186 189    BNP Recent Labs  Lab 11/17/20 1645  BNP 3,663.4*     DDimer No results for input(s): DDIMER in the last 168 hours.   Radiology    DG Chest 2 View  Result Date: 11/17/2020 CLINICAL DATA:  Shortness of breath over the last day. History of congestive heart failure. EXAM: CHEST - 2 VIEW COMPARISON:  10/23/2020 FINDINGS: Previous median sternotomy and CABG. Pacemaker/AICD in place. The heart is chronically enlarged. Chronic aortic atherosclerosis. There is pulmonary venous hypertension but no frank edema. Small effusions in the posterior costophrenic angles. No focal finding otherwise.  IMPRESSION: Congestive heart failure. Cardiomegaly. Pulmonary venous hypertension. Small effusions in the posterior costophrenic angles. Electronically Signed   By: Nelson Chimes M.D.   On: 11/17/2020 17:06    Cardiac Studies   Echo 10/24/2020 1. There is no left ventricular thrombus. Left ventricular ejection  fraction, by estimation, is 20 to 25%. The left ventricle has severely  decreased function. The left ventricle demonstrates regional wall motion  abnormalities (see scoring  diagram/findings for description). The left ventricular internal cavity  size was moderately to severely dilated. Left ventricular diastolic  parameters are consistent with Grade II diastolic dysfunction  (pseudonormalization). Elevated left atrial  pressure. There is mild apical dyskinesis. There is akinesis of the  mid-apical anterior wall and septum and the inferior apex.  2. Right  ventricular systolic function is mildly reduced. The right  ventricular size is normal. There is normal pulmonary artery systolic  pressure.  3. Left atrial size was severely dilated.  4. There is mitral leaflet malcoaptation due to adverse chamber  remodeling. Effective regurgitant orifice area 0.22 cm, regurgitant  volume 43 ml, regurgitant fraction 55%. The mitral valve is grossly  normal. Moderate to severe mitral valve  regurgitation. No evidence of mitral stenosis.  5. The aortic valve is tricuspid. Aortic valve regurgitation is mild.  Mild aortic valve sclerosis is present, with no evidence of aortic valve  stenosis.  6. The inferior vena cava is normal in size with greater than 50%  respiratory variability, suggesting right atrial pressure of 3 mmHg.   Left heart cath October 23, 2020  Ost LAD to Prox LAD lesion is 100% stenosed. Patent LIMA to LAD.  Ost RCA to Prox RCA lesion is 100% stenosed. Patent SVG to RCA.  Mid Cx lesion is 100% stenosed. Patent jump SVG to OM2/OM3  1st Mrg lesion is 50% stenosed. This is a small vessel, unchanged from prior cath.  LV end diastolic pressure is mildly elevated. LVEDP 20 mm Hg.  There is no aortic valve stenosis.  Tortuous left subclavian. Versacore wire used to navigate.   Patient Profile     74 y.o. male with a hx of CAD (CABG 1991), ICM, ICD, chronic CHF (systolic), VT, HTN, HLD, prostate CA and DVT, CKD (III) admitted with progressive SOB and acute/chronic CHF  Noted in ER to develop VT 120's under his detection rate, EP was called to evaluate   Device information MDT single chamber ICD implanted 2008 > gen change 2016 Interrogation noted 11/16/20 that he had an episode of his fast VT treated successfully with ATP 3rd VT zone was added at 120bpm with ATP schemes only, no HV therapy  Assessment & Plan    1. Sustained slow MMVT     Last month was here with HF and VT/shocks and was started on amiodarone he tells  me he was in the 1st down titration and taking 400mg  daily     Started on amio gtt in the ER      No further VT  Dr. Rayann Heman has seen him, reviewed telemetry, we will transition back to 400mg  daily today   ADDEND: Patient developed recurrent VT shortly after gtt stopped (had gotten PO) Rates 113-115 Pt was unaware of the change in rhythm, no CP. BP was 130/85. Sustained for about 52min > ordered amio 150mg  bolus Upon returning to his room with programmer the bolus was finished up and his rhythm converted back to SB 50's D/w Dr. Rayann Heman Would not change his VT zone any slower. Add  mexiletine   2. Acute/chronic CHF (systolic)     Cumulatively neg 851ml     Deferred to attending cardiology team  3. CAD     No CP     Cath last month     Deferred to attending cardiology team          For questions or updates, please contact [facility] Please consult [email or web address] for contact info under        Signed, Baldwin Jamaica, PA-C  11/19/2020, 8:19 AM     I have seen, examined the patient, and reviewed the above assessment and plan.  Changes to above are made where necessary.  On exam, RRR.  I am concerned that his SOB is not improved.  General cardiology is following closely.  VT appears to be secondary.  We will add mexiletine today. EP to follow.  Co Sign: Thompson Grayer, MD

## 2020-11-20 DIAGNOSIS — I5023 Acute on chronic systolic (congestive) heart failure: Secondary | ICD-10-CM

## 2020-11-20 LAB — BASIC METABOLIC PANEL
Anion gap: 11 (ref 5–15)
BUN: 27 mg/dL — ABNORMAL HIGH (ref 8–23)
CO2: 22 mmol/L (ref 22–32)
Calcium: 8.6 mg/dL — ABNORMAL LOW (ref 8.9–10.3)
Chloride: 104 mmol/L (ref 98–111)
Creatinine, Ser: 1.82 mg/dL — ABNORMAL HIGH (ref 0.61–1.24)
GFR, Estimated: 39 mL/min — ABNORMAL LOW (ref >60)
Glucose, Bld: 129 mg/dL — ABNORMAL HIGH (ref 70–99)
Potassium: 4.2 mmol/L (ref 3.5–5.1)
Sodium: 137 mmol/L (ref 135–145)

## 2020-11-20 LAB — PROCALCITONIN: Procalcitonin: 0.13 ng/mL

## 2020-11-20 LAB — MAGNESIUM: Magnesium: 2 mg/dL (ref 1.7–2.4)

## 2020-11-20 MED ORDER — FUROSEMIDE 10 MG/ML IJ SOLN
80.0000 mg | Freq: Two times a day (BID) | INTRAMUSCULAR | Status: DC
  Administered 2020-11-20 – 2020-11-21 (×3): 80 mg via INTRAVENOUS
  Filled 2020-11-20 (×3): qty 8

## 2020-11-20 MED ORDER — CARVEDILOL 3.125 MG PO TABS
3.1250 mg | ORAL_TABLET | Freq: Two times a day (BID) | ORAL | Status: DC
  Administered 2020-11-20 – 2020-11-21 (×2): 3.125 mg via ORAL
  Filled 2020-11-20 (×2): qty 1

## 2020-11-20 NOTE — Progress Notes (Signed)
Progress Note  Patient Name: [name] Date of Encounter: 11/20/2020  [facility] Cardiologist: Mertie Moores, MD   Subjective   No chest pain.  Continues to experience shortness of breath.  States he is more short of breath now than he was on admission.  No orthopnea or PND.  Inpatient Medications    Scheduled Meds: . amiodarone  400 mg Oral BID  . aspirin EC  81 mg Oral Daily  . atorvastatin  80 mg Oral QPM  . buPROPion  300 mg Oral Daily  . carvedilol  12.5 mg Oral BID WC  . escitalopram  20 mg Oral Daily  . heparin  5,000 Units Subcutaneous Q8H  . mexiletine  150 mg Oral Q8H  . multivitamin  1 tablet Oral Daily  . pantoprazole  40 mg Oral QAC breakfast  . potassium chloride  20 mEq Oral Daily  . sacubitril-valsartan  1 tablet Oral BID  . zolpidem  5 mg Oral QHS   Continuous Infusions:  PRN Meds: acetaminophen **OR** acetaminophen, guaiFENesin-dextromethorphan, ondansetron (ZOFRAN) IV   Vital Signs    Vitals:   11/19/20 2346 11/20/20 0425 11/20/20 0425 11/20/20 0734  BP: 131/85  135/78 (!) 133/94  Pulse: 62  60 62  Resp: 20  20 16   Temp: 98.6 F (37 C)  98.6 F (37 C) 98.3 F (36.8 C)  TempSrc: Oral  Oral Oral  SpO2: 96%  95% 94%  Weight:  83.4 kg    Height:        Intake/Output Summary (Last 24 hours) at 11/20/2020 0942 Last data filed at 11/20/2020 0857 Gross per 24 hour  Intake 1200 ml  Output 1750 ml  Net -550 ml   Last 3 Weights 11/20/2020 11/19/2020 11/17/2020  Weight (lbs) 183 lb 13.8 oz 185 lb 13.6 oz 185 lb  Weight (kg) 83.4 kg 84.3 kg 83.915 kg      Telemetry    Sinus rhythm since 9:30 AM 12/23.  Prolonged VT prior to that - Personally Reviewed   Physical Exam  Alert, oriented male in no distress GEN: No acute distress.   Neck: No JVD Cardiac: RRR, 3/6 holosystolic murmur at the apex Respiratory:  Inspiratory rales both lower lung fields GI: Soft, nontender, non-distended  MS: No edema; No deformity. Neuro:   Nonfocal  Psych: Normal affect   Labs    High Sensitivity Troponin:   Recent Labs  Lab 10/23/20 1044 10/23/20 1717 11/17/20 1644 11/17/20 1828  TROPONINIHS 55* 84* 29* 29*      Chemistry Recent Labs  Lab 11/19/20 0301 11/19/20 1052 11/20/20 0801  NA 139 135 137  K 4.7 4.0 4.2  CL 105 104 104  CO2 23 19* 22  GLUCOSE 118* 126* 129*  BUN 26* 28* 27*  CREATININE 1.72* 1.74* 1.82*  CALCIUM 8.5* 8.2* 8.6*  GFRNONAA 41* 41* 39*  ANIONGAP 11 12 11      Hematology Recent Labs  Lab 11/17/20 1644 11/19/20 0301  WBC 7.0 7.2  RBC 3.83* 3.86*  HGB 10.3* 10.6*  HCT 33.9* 33.0*  MCV 88.5 85.5  MCH 26.9 27.5  MCHC 30.4 32.1  RDW 15.0 15.0  PLT 186 189    BNP Recent Labs  Lab 11/17/20 1645  BNP 3,663.4*     DDimer No results for input(s): DDIMER in the last 168 hours.   Radiology    No results found.  Cardiac Studies   Cardiac Cath 10/23/2020: Conclusion    Ost LAD to Prox LAD lesion  is 100% stenosed. Patent LIMA to LAD.  Ost RCA to Prox RCA lesion is 100% stenosed. Patent SVG to RCA.  Mid Cx lesion is 100% stenosed. Patent jump SVG to OM2/OM3  1st Mrg lesion is 50% stenosed. This is a small vessel, unchanged from prior cath.  LV end diastolic pressure is mildly elevated. LVEDP 20 mm Hg.  There is no aortic valve stenosis.  Tortuous left subclavian. Versacore wire used to navigate.   Continue medical therapy for systolic heart failure.  Further management per EP for VT.  Left message for wife [phone].  Unable to find her in the [facility] waiting room.  Diagnostic Dominance: Right    Echo: IMPRESSIONS    1. There is no left ventricular thrombus. Left ventricular ejection  fraction, by estimation, is 20 to 25%. The left ventricle has severely  decreased function. The left ventricle demonstrates regional wall motion  abnormalities (see scoring  diagram/findings for description). The left ventricular internal cavity  size was moderately to  severely dilated. Left ventricular diastolic  parameters are consistent with Grade II diastolic dysfunction  (pseudonormalization). Elevated left atrial  pressure. There is mild apical dyskinesis. There is akinesis of the  mid-apical anterior wall and septum and the inferior apex.  2. Right ventricular systolic function is mildly reduced. The right  ventricular size is normal. There is normal pulmonary artery systolic  pressure.  3. Left atrial size was severely dilated.  4. There is mitral leaflet malcoaptation due to adverse chamber  remodeling. Effective regurgitant orifice area 0.22 cm, regurgitant  volume 43 ml, regurgitant fraction 55%. The mitral valve is grossly  normal. Moderate to severe mitral valve  regurgitation. No evidence of mitral stenosis.  5. The aortic valve is tricuspid. Aortic valve regurgitation is mild.  Mild aortic valve sclerosis is present, with no evidence of aortic valve  stenosis.  6. The inferior vena cava is normal in size with greater than 50%  respiratory variability, suggesting right atrial pressure of 3 mmHg.   FINDINGS  Left Ventricle: There is no left ventricular thrombus. Left ventricular  ejection fraction, by estimation, is 20 to 25%. The left ventricle has  severely decreased function. The left ventricle demonstrates regional wall  motion abnormalities. The left  ventricular internal cavity size was moderately to severely dilated. There  is no left ventricular hypertrophy. Left ventricular diastolic parameters  are consistent with Grade II diastolic dysfunction (pseudonormalization).  Elevated left atrial pressure.   Patient Profile     74 y.o. male with progressive dyspnea, acute on chronic systolic heart failure, and ventricular tachycardia  Assessment & Plan    1.  Monomorphic ventricular tachycardia: Currently on amiodarone 400 mg twice daily and mexiletine.  Patient has been seen by the electrophysiology service.  ICD VT zones  have been adjusted.  Telemetry is reviewed and he has been in sinus rhythm now for 24 hours. 2.  Acute on chronic systolic heart failure: Echo images reviewed.  Patient with very severe LV dysfunction and LV dilatation.  I think he has severe secondary mitral regurgitation as well.  He is not clinically improved with attempts at diuresis, creatinine increasing and only 1.2 L negative since admission.  I discussed his case with Dr. Acie Fredrickson and Dr. Haroldine Laws.  Will reduce carvedilol, attempt IV diuresis today, repeat metabolic panel in the morning, and consider inotropic therapy if he does not respond well to these measures.  He continues on Entresto, spironolactone. 3.  Severe mitral regurgitation, likely ischemic.  Would  anticipate that he will require right heart catheterization and transesophageal echocardiogram during this hospital admission considering his lack of improvement with standard heart failure therapies.  Disposition: Medication changes as outlined above with reduction in carvedilol dose, add IV furosemide 80 mg twice daily, and advanced heart failure service will see over the weekend.  Anticipate right heart catheterization/TEE next week.     For questions or updates, please contact [place] Please consult [email or web address] for contact info under        Signed, Sherren Mocha, MD  11/20/2020, 9:42 AM

## 2020-11-20 NOTE — Progress Notes (Signed)
PROGRESS NOTE    [name]  [name] DOB: June 16, 1946 DOA: 11/17/2020 PCP: Hoyt Koch, MD   Brief Narrative:  [name] is a 74 y.o. male with medical history significant of CAD status post CABG and PCI, chronic combined systolic and diastolic CHF/ischemic cardiomyopathy with LVEF 20-25% and history of VT status post ICD, hypertension, hyperlipidemia, CKD stage IIIa, history of prostate and tonsillar cancer, history of DVT presenting to the ED with complaints of shortness of breath.  Patient reports dyspnea on exertion and cough for the past 3 to 4 days.  He was having low-grade fevers earlier but has not had them for the past few days.  He has also had 3 episodes of vomiting over the past week which has now resolved.  Denies abdominal pain or diarrhea.  States his ICD had shocked him prior to his recent hospitalization but he has not had any issues lately.  He is vaccinated against Covid.  States he has been checked for Covid multiple times recently and every time results came back negative.  ED Course: Afebrile.  Tachypneic with respiratory rate in the 20s.  Not tachycardic.  Not hypoxic.  WBC 7.0, hemoglobin 10.3, hematocrit 33.9, platelet 186K.  Sodium 140, potassium 3.7, chloride 107, bicarb 22, BUN 27, creatinine 1.7, glucose 106.  High-sensitivity troponin mildly elevated but stable (29 >29).  BNP significantly elevated at 3663.  SARS-CoV-2 PCR test and influenza panel both negative.  Chest x-ray showing cardiomegaly and small effusions in the posterior costophrenic angles; no frank edema.Patient was given IV Lasix 20 mg.  Assessment & Plan:  Acute on chronic combined systolic and diastolic CHF: -Patient presented with signs of fluid overload.  Chest x-ray shows cardiomegaly and small effusion in the posterior costophrenic angles.  -On admission: Has bibasilar Rales, JVD and significantly elevated BNP of 3663. -Echo from 10/24/2020 showing severely  reduced ejection fraction of 20 to 123456, grade 2 diastolic dysfunction, moderate to severe MR. -Patient started on Lasix 80 IV twice daily as per cardiology recommendations.  Reduce Coreg dose to 3.125 twice daily -Strict INO's and daily weight.  Monitor signs of fluid overload.  Monitor electrolytes closely. -Keep potassium more than 4, magnesium more than 2.    Monomorphic ventricular tachycardia: -Was given loading dose of amiodarone and then was placed on GGT-currently on amiodarone 400 mg twice daily.  Has been in sinus rhythm for now. -Continue mexiletine -Appreciate cardiology and EP team assistance  AKI on CKD stage IIIa: -Kidney function slightly declined this morning. Need went up from 1.56-1.72 to 1.82 GFR declined from 47-41 to 39. -Could be secondary to cardiorenal in the setting of acutely decompensated CHF versus dehydration.  Continue IV diuresis. -Continue to monitor kidney function closely. Continue  Entresto as per cardiology recommendations.  Hold losartan. -Repeat BMP tomorrow a.m.  Severe MR: -Likely require right heart cath and TEE during this hospitalization as per cardiology-possibly next week.  Appreciate cardiology assistance.  History of coronary artery disease status post CABG and PCI: Patient denies ACS symptoms.  Troponin mildly elevated but stable 29x2.  Suspected demand ischemia from decompensated CHF. -Continue aspirin, beta-blocker and statin  Hypertension: Stable.  Continue Coreg and Entresto. Monitor blood pressure closely.  Hyperlipidemia: Continue Lipitor  GERD: Continue PPI  Hypokalemia and hypomagnesemia: Replenished. Resolved  Depression/anxiety: Continue Lexapro, Wellbutrin and Ambien  DVT prophylaxis: Heparin Code Status: None Family Communication: None present at bedside.  Plan of care discussed with patient in length and he verbalized understanding  and agreed with it. Disposition Plan: Likely home in 1 to 2 days  Consultants:    Cardiology  Procedures:   None  Antimicrobials:   None  Status is: Inpatient   Dispo: The patient is from: Home              Anticipated d/c is to: Home              Anticipated d/c date is: 2 days              Patient currently is not medically stable to d/c.         Subjective: Patient seen and examined.  Continues to have shortness of breath with mild exertion.  He denies any chest pain, nausea, vomiting, leg swelling, orthopnea or PND. Objective: Vitals:   11/19/20 2346 11/20/20 0425 11/20/20 0425 11/20/20 0734  BP: 131/85  135/78 (!) 133/94  Pulse: 62  60 62  Resp: 20  20 16   Temp: 98.6 F (37 C)  98.6 F (37 C) 98.3 F (36.8 C)  TempSrc: Oral  Oral Oral  SpO2: 96%  95% 94%  Weight:  83.4 kg    Height:        Intake/Output Summary (Last 24 hours) at 11/20/2020 1016 Last data filed at 11/20/2020 0857 Gross per 24 hour  Intake 1200 ml  Output 1500 ml  Net -300 ml   Filed Weights   11/17/20 1653 11/19/20 0422 11/20/20 0425  Weight: 83.9 kg 84.3 kg 83.4 kg    Examination:  General exam: Appears calm and comfortable, on room air, elderly,  communicating well Respiratory system: Clear to auscultation. Respiratory effort normal. Cardiovascular system: S1 & S2 heard, RRR. No JVD, murmurs, rubs, gallops or clicks. No pedal edema. Gastrointestinal system: Abdomen is nondistended, soft and nontender. No organomegaly or masses felt. Normal bowel sounds heard. Central nervous system: Alert and oriented. No focal neurological deficits. Extremities: Symmetric 5 x 5 power. Skin: No rashes, lesions or ulcers Psychiatry: Judgement and insight appear normal. Mood & affect appropriate.    Data Reviewed: I have personally reviewed following labs and imaging studies  CBC: Recent Labs  Lab 11/17/20 1644 11/19/20 0301  WBC 7.0 7.2  HGB 10.3* 10.6*  HCT 33.9* 33.0*  MCV 88.5 85.5  PLT 186 [id number]   Basic Metabolic Panel: Recent Labs  Lab 11/17/20 1644  11/18/20 0257 11/19/20 0301 11/19/20 1052 11/20/20 0123 11/20/20 0801  NA 140 140 139 135  --  137  K 3.7 3.1* 4.7 4.0  --  4.2  CL 107 106 105 104  --  104  CO2 22 19* 23 19*  --  22  GLUCOSE 106* 94 118* 126*  --  129*  BUN 27* 26* 26* 28*  --  27*  CREATININE 1.72* 1.56* 1.72* 1.74*  --  1.82*  CALCIUM 8.5* 8.3* 8.5* 8.2*  --  8.6*  MG  --  1.7 2.0  --  2.0  --    GFR: Estimated Creatinine Clearance: 39.7 mL/min (A) (by C-G formula based on SCr of 1.82 mg/dL (H)). Liver Function Tests: No results for input(s): AST, ALT, ALKPHOS, BILITOT, PROT, ALBUMIN in the last 168 hours. No results for input(s): LIPASE, AMYLASE in the last 168 hours. No results for input(s): AMMONIA in the last 168 hours. Coagulation Profile: No results for input(s): INR, PROTIME in the last 168 hours. Cardiac Enzymes: No results for input(s): CKTOTAL, CKMB, CKMBINDEX, TROPONINI in the last 168 hours. BNP (last 3  results) No results for input(s): PROBNP in the last 8760 hours. HbA1C: No results for input(s): HGBA1C in the last 72 hours. CBG: No results for input(s): GLUCAP in the last 168 hours. Lipid Profile: No results for input(s): CHOL, HDL, LDLCALC, TRIG, CHOLHDL, LDLDIRECT in the last 72 hours. Thyroid Function Tests: No results for input(s): TSH, T4TOTAL, FREET4, T3FREE, THYROIDAB in the last 72 hours. Anemia Panel: No results for input(s): VITAMINB12, FOLATE, FERRITIN, TIBC, IRON, RETICCTPCT in the last 72 hours. Sepsis Labs: Recent Labs  Lab 11/18/20 1254 11/19/20 0301 11/20/20 0123  PROCALCITON 0.10 <0.10 0.13    Recent Results (from the past 240 hour(s))  Novel Coronavirus, NAA ([facility])     Status: None   Collection Time: 11/13/20 10:09 AM   Specimen: Nasopharyngeal(NP) swabs in vial transport medium   Nasopharynge  Result Value Ref Range Status   SARS-CoV-2, NAA Not Detected Not Detected Final    Comment: This nucleic acid amplification test was developed and its  performance characteristics determined by [employer]. Nucleic acid amplification tests include RT-PCR and TMA. This test has not been FDA cleared or approved. This test has been authorized by FDA under an Emergency Use Authorization (EUA). This test is only authorized for the duration of time the declaration that circumstances exist justifying the authorization of the emergency use of in vitro diagnostic tests for detection of SARS-CoV-2 virus and/or diagnosis of COVID-19 infection under section 564(b)(1) of the Act, 21 U.S.C. [id number]) (1), unless the authorization is terminated or revoked sooner. When diagnostic testing is negative, the possibility of a false negative result should be considered in the context of a patient's recent exposures and the presence of clinical signs and symptoms consistent with COVID-19. An individual without symptoms of COVID-19 and who is not shedding SARS-CoV-2 virus wo uld expect to have a negative (not detected) result in this assay.   SARS-COV-2, NAA 2 DAY TAT     Status: None   Collection Time: 11/13/20 10:09 AM   Nasopharynge  Result Value Ref Range Status   SARS-CoV-2, NAA 2 DAY TAT Performed  Final  Resp Panel by RT-PCR (Flu A&B, Covid) Nasopharyngeal Swab     Status: None   Collection Time: 11/17/20 10:35 PM   Specimen: Nasopharyngeal Swab; Nasopharyngeal(NP) swabs in vial transport medium  Result Value Ref Range Status   SARS Coronavirus 2 by RT PCR NEGATIVE NEGATIVE Final    Comment: (NOTE) SARS-CoV-2 target nucleic acids are NOT DETECTED.  The SARS-CoV-2 RNA is generally detectable in upper respiratory specimens during the acute phase of infection. The lowest concentration of SARS-CoV-2 viral copies this assay can detect is 138 copies/mL. A negative result does not preclude SARS-Cov-2 infection and should not be used as the sole basis for treatment or other patient management decisions. A negative result may occur with   improper specimen collection/handling, submission of specimen other than nasopharyngeal swab, presence of viral mutation(s) within the areas targeted by this assay, and inadequate number of viral copies(<138 copies/mL). A negative result must be combined with clinical observations, patient history, and epidemiological information. The expected result is Negative.  Fact Sheet for Patients:  EntrepreneurPulse.com.au  Fact Sheet for Healthcare Providers:  IncredibleEmployment.be  This test is no t yet approved or cleared by the [place] FDA and  has been authorized for detection and/or diagnosis of SARS-CoV-2 by FDA under an Emergency Use Authorization (EUA). This EUA will remain  in effect (meaning this test can be used) for the duration of the  COVID-19 declaration under Section 564(b)(1) of the Act, 21 U.S.C.section 360bbb-3(b)(1), unless the authorization is terminated  or revoked sooner.       Influenza A by PCR NEGATIVE NEGATIVE Final   Influenza B by PCR NEGATIVE NEGATIVE Final    Comment: (NOTE) The Xpert Xpress SARS-CoV-2/FLU/RSV plus assay is intended as an aid in the diagnosis of influenza from Nasopharyngeal swab specimens and should not be used as a sole basis for treatment. Nasal washings and aspirates are unacceptable for Xpert Xpress SARS-CoV-2/FLU/RSV testing.  Fact Sheet for Patients: EntrepreneurPulse.com.au  Fact Sheet for Healthcare Providers: IncredibleEmployment.be  This test is not yet approved or cleared by the [place] FDA and has been authorized for detection and/or diagnosis of SARS-CoV-2 by FDA under an Emergency Use Authorization (EUA). This EUA will remain in effect (meaning this test can be used) for the duration of the COVID-19 declaration under Section 564(b)(1) of the Act, 21 U.S.C. section 360bbb-3(b)(1), unless the authorization is terminated  or revoked.  Performed at [facility], [place] [address], [place], [place] [address]       Radiology Studies: No results found.  Scheduled Meds:  amiodarone  400 mg Oral BID   aspirin EC  81 mg Oral Daily   atorvastatin  80 mg Oral QPM   buPROPion  300 mg Oral Daily   carvedilol  3.125 mg Oral BID WC   escitalopram  20 mg Oral Daily   furosemide  80 mg Intravenous BID   heparin  5,000 Units Subcutaneous Q8H   mexiletine  150 mg Oral Q8H   multivitamin  1 tablet Oral Daily   pantoprazole  40 mg Oral QAC breakfast   potassium chloride  20 mEq Oral Daily   sacubitril-valsartan  1 tablet Oral BID   zolpidem  5 mg Oral QHS   Continuous Infusions:    LOS: 3 days   Time spent: 40 minutes  Peter Pesqueira Loann Quill, MD [facility]  If 7PM-7AM, please contact night-coverage [email or web address] 11/20/2020, 10:16 AM

## 2020-11-20 NOTE — Progress Notes (Signed)
Progress Note   Subjective   Continues to have SOB.  Feels worse than yesterday.  No chest pain.  No further arrhythmias.  Inpatient Medications    Scheduled Meds: . amiodarone  400 mg Oral BID  . aspirin EC  81 mg Oral Daily  . atorvastatin  80 mg Oral QPM  . buPROPion  300 mg Oral Daily  . carvedilol  3.125 mg Oral BID WC  . escitalopram  20 mg Oral Daily  . furosemide  80 mg Intravenous BID  . heparin  5,000 Units Subcutaneous Q8H  . mexiletine  150 mg Oral Q8H  . multivitamin  1 tablet Oral Daily  . pantoprazole  40 mg Oral QAC breakfast  . potassium chloride  20 mEq Oral Daily  . sacubitril-valsartan  1 tablet Oral BID  . zolpidem  5 mg Oral QHS   Continuous Infusions:  PRN Meds: acetaminophen **OR** acetaminophen, guaiFENesin-dextromethorphan, ondansetron (ZOFRAN) IV   Vital Signs    Vitals:   11/19/20 2346 11/20/20 0425 11/20/20 0425 11/20/20 0734  BP: 131/85  135/78 (!) 133/94  Pulse: 62  60 62  Resp: 20  20 16   Temp: 98.6 F (37 C)  98.6 F (37 C) 98.3 F (36.8 C)  TempSrc: Oral  Oral Oral  SpO2: 96%  95% 94%  Weight:  83.4 kg    Height:        Intake/Output Summary (Last 24 hours) at 11/20/2020 1141 Last data filed at 11/20/2020 0857 Gross per 24 hour  Intake 1200 ml  Output 1500 ml  Net -300 ml   Filed Weights   11/17/20 1653 11/19/20 0422 11/20/20 0425  Weight: 83.9 kg 84.3 kg 83.4 kg    Telemetry    sinus - Personally Reviewed  Physical Exam   GEN- The patient is chronically ill appearing, alert and oriented x 3 today.   Head- normocephalic, atraumatic Eyes-  Sclera clear, conjunctiva pink Ears- hearing intact Oropharynx- clear Neck- supple, + JVD Lungs-  normal work of breathing, relatively clear Heart- Regular rate and rhythm  GI- soft  Extremities- no clubbing, cyanosis, or edema  MS- diffuse atrophy atrophy Skin- no rash or lesion Psych- euthymic mood, full affect Neuro- strength and sensation are intact   Labs     Chemistry Recent Labs  Lab 11/19/20 0301 11/19/20 1052 11/20/20 0801  NA 139 135 137  K 4.7 4.0 4.2  CL 105 104 104  CO2 23 19* 22  GLUCOSE 118* 126* 129*  BUN 26* 28* 27*  CREATININE 1.72* 1.74* 1.82*  CALCIUM 8.5* 8.2* 8.6*  GFRNONAA 41* 41* 39*  ANIONGAP 11 12 11      Hematology Recent Labs  Lab 11/17/20 1644 11/19/20 0301  WBC 7.0 7.2  RBC 3.83* 3.86*  HGB 10.3* 10.6*  HCT 33.9* 33.0*  MCV 88.5 85.5  MCH 26.9 27.5  MCHC 30.4 32.1  RDW 15.0 15.0  PLT 186 189     Patient ID  74 y.o. male with a hx of CAD (CABG 1991), ICM, ICD, chronic CHF (systolic), VT, HTN, HLD, prostate CA and DVT, CKD (III) admitted with progressive SOB and acute/chronic CHF  Noted in ER to develop VT 120's under his detection rate, EP was called to evaluate   Device information MDT single chamber ICD implanted 2008 > gen change 2016 Interrogation noted 11/16/20 that he had an episode of his fast VT treated successfully with ATP 3rd VT zone was added at 120bpm with ATP schemes only, no HV  therapy  Assessment & Plan    1.  Slow VT None overnight Continue oral mexiletine and amiodarone Keep K >3.9, Mg >1.9  2. SOB This is his primary issue.  I have reviewed Dr York Cerise note.  General cardiology to diurese over the weekend.  [place] may be beneficial. LHC reviewed from November.  EP to be available as needed over the weekend. Will need close follow-up with EP on discharge.  Thompson Grayer MD, [facility] 11/20/2020 11:41 AM

## 2020-11-21 ENCOUNTER — Inpatient Hospital Stay: Payer: Self-pay

## 2020-11-21 DIAGNOSIS — N179 Acute kidney failure, unspecified: Secondary | ICD-10-CM

## 2020-11-21 DIAGNOSIS — E876 Hypokalemia: Secondary | ICD-10-CM

## 2020-11-21 DIAGNOSIS — R570 Cardiogenic shock: Secondary | ICD-10-CM

## 2020-11-21 LAB — COOXEMETRY PANEL
Carboxyhemoglobin: 1.1 % (ref 0.5–1.5)
Methemoglobin: 0.9 % (ref 0.0–1.5)
O2 Saturation: 43.9 %
Total hemoglobin: 10.6 g/dL — ABNORMAL LOW (ref 12.0–16.0)

## 2020-11-21 LAB — BASIC METABOLIC PANEL
Anion gap: 12 (ref 5–15)
BUN: 27 mg/dL — ABNORMAL HIGH (ref 8–23)
CO2: 23 mmol/L (ref 22–32)
Calcium: 8.4 mg/dL — ABNORMAL LOW (ref 8.9–10.3)
Chloride: 103 mmol/L (ref 98–111)
Creatinine, Ser: 1.96 mg/dL — ABNORMAL HIGH (ref 0.61–1.24)
GFR, Estimated: 35 mL/min — ABNORMAL LOW (ref >60)
Glucose, Bld: 98 mg/dL (ref 70–99)
Potassium: 4 mmol/L (ref 3.5–5.1)
Sodium: 138 mmol/L (ref 135–145)

## 2020-11-21 LAB — MAGNESIUM: Magnesium: 1.9 mg/dL (ref 1.7–2.4)

## 2020-11-21 MED ORDER — CHLORHEXIDINE GLUCONATE CLOTH 2 % EX PADS
6.0000 | MEDICATED_PAD | Freq: Every day | CUTANEOUS | Status: DC
  Administered 2020-11-21 – 2020-12-04 (×12): 6 via TOPICAL

## 2020-11-21 MED ORDER — SODIUM CHLORIDE 0.9% FLUSH
3.0000 mL | Freq: Two times a day (BID) | INTRAVENOUS | Status: DC
  Administered 2020-11-21 – 2020-12-03 (×4): 3 mL via INTRAVENOUS

## 2020-11-21 MED ORDER — SODIUM CHLORIDE 0.9% FLUSH: 10.0000 mL | INTRAVENOUS | Status: DC | PRN

## 2020-11-21 MED ORDER — MILRINONE LACTATE IN DEXTROSE 20-5 MG/100ML-% IV SOLN
0.3750 ug/kg/min | INTRAVENOUS | Status: DC
  Administered 2020-11-21 – 2020-11-29 (×11): 0.25 ug/kg/min via INTRAVENOUS
  Administered 2020-11-30 – 2020-12-02 (×5): 0.375 ug/kg/min via INTRAVENOUS
  Filled 2020-11-21 (×19): qty 100

## 2020-11-21 MED ORDER — SODIUM CHLORIDE 0.9% FLUSH
10.0000 mL | Freq: Two times a day (BID) | INTRAVENOUS | Status: DC
  Administered 2020-11-21 – 2020-12-04 (×14): 10 mL

## 2020-11-21 MED ORDER — MAGNESIUM SULFATE 2 GM/50ML IV SOLN
2.0000 g | Freq: Once | INTRAVENOUS | Status: AC
  Administered 2020-11-21: 2 g via INTRAVENOUS
  Filled 2020-11-21: qty 50

## 2020-11-21 NOTE — Progress Notes (Signed)
PROGRESS NOTE    [name]  [id number] DOB: 06/09/1946 DOA: 11/17/2020 PCP: Hoyt Koch, MD   Brief Narrative:  [name] is a 74 y.o. male with medical history significant of CAD status post CABG and PCI, chronic combined systolic and diastolic CHF/ischemic cardiomyopathy with LVEF 20-25% and history of VT status post ICD, hypertension, hyperlipidemia, CKD stage IIIa, history of prostate and tonsillar cancer, history of DVT presenting to the ED with complaints of shortness of breath.  Patient reports dyspnea on exertion and cough for the past 3 to 4 days.  He was having low-grade fevers earlier but has not had them for the past few days.  He has also had 3 episodes of vomiting over the past week which has now resolved.  Denies abdominal pain or diarrhea.  States his ICD had shocked him prior to his recent hospitalization but he has not had any issues lately.  He is vaccinated against Covid.  States he has been checked for Covid multiple times recently and every time results came back negative.  ED Course: Afebrile.  Tachypneic with respiratory rate in the 20s.  Not tachycardic.  Not hypoxic.  WBC 7.0, hemoglobin 10.3, hematocrit 33.9, platelet 186K.  Sodium 140, potassium 3.7, chloride 107, bicarb 22, BUN 27, creatinine 1.7, glucose 106.  High-sensitivity troponin mildly elevated but stable (29 >29).  BNP significantly elevated at 3663.  SARS-CoV-2 PCR test and influenza panel both negative.  Chest x-ray showing cardiomegaly and small effusions in the posterior costophrenic angles; no frank edema.Patient was given IV Lasix 20 mg.  Assessment & Plan:  Acute on chronic combined systolic and diastolic CHF: -Patient presented with signs of fluid overload.  Chest x-ray shows cardiomegaly and small effusion in the posterior costophrenic angles.  -On admission: Has bibasilar Rales, JVD and significantly elevated BNP of 3663. -Echo from 10/24/2020 showing severely  reduced ejection fraction of 20 to [id number], grade 2 diastolic dysfunction, moderate to severe MR. -Continue Lasix 80 IV twice daily and Coreg 3.125 mg twice daily as per cardiology recommendations.  -Strict INO's and daily weight.  Monitor signs of fluid overload.  Monitor electrolytes closely. -Keep potassium more than 4, magnesium more than 2.    Monomorphic ventricular tachycardia: -Was given loading dose of amiodarone and then was placed on GGT-currently on amiodarone 400 mg twice daily.  Has been in sinus rhythm for now. -Continue mexiletine -Appreciate cardiology and EP team assistance  AKI on CKD stage IIIa: -Kidney function slightly declined this morning. Need went up from 1.56-1.72 to 1.82-1.96 GFR declined from 47-41 to 39-35. -Continue IV diuresis and Entresto-defer this to cardiology -Continue to monitor kidney function closely. -Repeat BMP tomorrow a.m.  Severe MR: -Likely require right heart cath and TEE during this hospitalization as per cardiology-possibly next week.  Appreciate cardiology assistance.  History of coronary artery disease status post CABG and PCI: Patient denies ACS symptoms.  Troponin mildly elevated but stable 29x2.  Suspected demand ischemia from decompensated CHF. -Continue aspirin, beta-blocker and statin  Hypertension: Stable.  Continue Coreg and Entresto. Monitor blood pressure closely.  Hyperlipidemia: Continue Lipitor  GERD: Continue PPI  Hypokalemia and hypomagnesemia: Replenished.  Repeat labs tomorrow  Depression/anxiety: Continue Lexapro, Wellbutrin and Ambien  DVT prophylaxis: Heparin Code Status: None Family Communication: None present at bedside.  Plan of care discussed with patient in length and he verbalized understanding and agreed with it. Disposition Plan: Likely home in 1 to 2 days  Consultants:   Cardiology  Procedures:  None  Antimicrobials:   None  Status is: Inpatient   Dispo: The patient is from: Home               Anticipated d/c is to: Home              Anticipated d/c date is: 2 days              Patient currently is not medically stable to d/c.         Subjective: Patient seen and examined.  Tells me that his shortness of breath is much better this morning.  He denies chest pain, palpitation, leg swelling, orthopnea, PND, nausea, vomiting, lightheadedness or dizziness. Objective: Vitals:   11/21/20 0422 11/21/20 0728 11/21/20 0800 11/21/20 0900  BP: 118/79 129/88 113/78   Pulse: 63 66 60   Resp: 20 16 (!) 21   Temp: 98.2 F (36.8 C) 98.3 F (36.8 C)    TempSrc: Oral Oral    SpO2: 94% 97%  100%  Weight:      Height:        Intake/Output Summary (Last 24 hours) at 11/21/2020 0953 Last data filed at 11/21/2020 0900 Gross per 24 hour  Intake 1250.53 ml  Output 1300 ml  Net -49.47 ml   Filed Weights   11/19/20 0422 11/20/20 0425 11/21/20 0110  Weight: 84.3 kg 83.4 kg 81.1 kg    Examination:  General exam: Appears calm and comfortable, on room air, elderly,  communicating well Respiratory system: Clear to auscultation. Respiratory effort normal. Cardiovascular system: S1 & S2 heard, RRR. No JVD, murmurs, rubs, gallops or clicks. No pedal edema. Gastrointestinal system: Abdomen is nondistended, soft and nontender. No organomegaly or masses felt. Normal bowel sounds heard. Central nervous system: Alert and oriented. No focal neurological deficits. Extremities: Symmetric 5 x 5 power. Skin: No rashes, lesions or ulcers Psychiatry: Judgement and insight appear normal. Mood & affect appropriate.    Data Reviewed: I have personally reviewed following labs and imaging studies  CBC: Recent Labs  Lab 11/17/20 1644 11/19/20 0301  WBC 7.0 7.2  HGB 10.3* 10.6*  HCT 33.9* 33.0*  MCV 88.5 85.5  PLT 186 409   Basic Metabolic Panel: Recent Labs  Lab 11/18/20 0257 11/19/20 0301 11/19/20 1052 11/20/20 0123 11/20/20 0801 11/21/20 0242  NA 140 139 135  --  137 138  K 3.1*  4.7 4.0  --  4.2 4.0  CL 106 105 104  --  104 103  CO2 19* 23 19*  --  22 23  GLUCOSE 94 118* 126*  --  129* 98  BUN 26* 26* 28*  --  27* 27*  CREATININE 1.56* 1.72* 1.74*  --  1.82* 1.96*  CALCIUM 8.3* 8.5* 8.2*  --  8.6* 8.4*  MG 1.7 2.0  --  2.0  --  1.9   GFR: Estimated Creatinine Clearance: 36.8 mL/min (A) (by C-G formula based on SCr of 1.96 mg/dL (H)). Liver Function Tests: No results for input(s): AST, ALT, ALKPHOS, BILITOT, PROT, ALBUMIN in the last 168 hours. No results for input(s): LIPASE, AMYLASE in the last 168 hours. No results for input(s): AMMONIA in the last 168 hours. Coagulation Profile: No results for input(s): INR, PROTIME in the last 168 hours. Cardiac Enzymes: No results for input(s): CKTOTAL, CKMB, CKMBINDEX, TROPONINI in the last 168 hours. BNP (last 3 results) No results for input(s): PROBNP in the last 8760 hours. HbA1C: No results for input(s): HGBA1C in the last 72 hours. CBG:  No results for input(s): GLUCAP in the last 168 hours. Lipid Profile: No results for input(s): CHOL, HDL, LDLCALC, TRIG, CHOLHDL, LDLDIRECT in the last 72 hours. Thyroid Function Tests: No results for input(s): TSH, T4TOTAL, FREET4, T3FREE, THYROIDAB in the last 72 hours. Anemia Panel: No results for input(s): VITAMINB12, FOLATE, FERRITIN, TIBC, IRON, RETICCTPCT in the last 72 hours. Sepsis Labs: Recent Labs  Lab 11/18/20 1254 11/19/20 0301 11/20/20 0123  PROCALCITON 0.10 <0.10 0.13    Recent Results (from the past 240 hour(s))  Novel Coronavirus, NAA ([facility])     Status: None   Collection Time: 11/13/20 10:09 AM   Specimen: Nasopharyngeal(NP) swabs in vial transport medium   Nasopharynge  Result Value Ref Range Status   SARS-CoV-2, NAA Not Detected Not Detected Final    Comment: This nucleic acid amplification test was developed and its performance characteristics determined by [employer]. Nucleic acid amplification tests include RT-PCR and TMA. This  test has not been FDA cleared or approved. This test has been authorized by FDA under an Emergency Use Authorization (EUA). This test is only authorized for the duration of time the declaration that circumstances exist justifying the authorization of the emergency use of in vitro diagnostic tests for detection of SARS-CoV-2 virus and/or diagnosis of COVID-19 infection under section 564(b)(1) of the Act, 21 U.S.C. [id number]) (1), unless the authorization is terminated or revoked sooner. When diagnostic testing is negative, the possibility of a false negative result should be considered in the context of a patient's recent exposures and the presence of clinical signs and symptoms consistent with COVID-19. An individual without symptoms of COVID-19 and who is not shedding SARS-CoV-2 virus wo uld expect to have a negative (not detected) result in this assay.   SARS-COV-2, NAA 2 DAY TAT     Status: None   Collection Time: 11/13/20 10:09 AM   Nasopharynge  Result Value Ref Range Status   SARS-CoV-2, NAA 2 DAY TAT Performed  Final  Resp Panel by RT-PCR (Flu A&B, Covid) Nasopharyngeal Swab     Status: None   Collection Time: 11/17/20 10:35 PM   Specimen: Nasopharyngeal Swab; Nasopharyngeal(NP) swabs in vial transport medium  Result Value Ref Range Status   SARS Coronavirus 2 by RT PCR NEGATIVE NEGATIVE Final    Comment: (NOTE) SARS-CoV-2 target nucleic acids are NOT DETECTED.  The SARS-CoV-2 RNA is generally detectable in upper respiratory specimens during the acute phase of infection. The lowest concentration of SARS-CoV-2 viral copies this assay can detect is 138 copies/mL. A negative result does not preclude SARS-Cov-2 infection and should not be used as the sole basis for treatment or other patient management decisions. A negative result may occur with  improper specimen collection/handling, submission of specimen other than nasopharyngeal swab, presence of viral mutation(s) within  the areas targeted by this assay, and inadequate number of viral copies(<138 copies/mL). A negative result must be combined with clinical observations, patient history, and epidemiological information. The expected result is Negative.  Fact Sheet for Patients:  EntrepreneurPulse.com.au  Fact Sheet for Healthcare Providers:  IncredibleEmployment.be  This test is no t yet approved or cleared by the [place] FDA and  has been authorized for detection and/or diagnosis of SARS-CoV-2 by FDA under an Emergency Use Authorization (EUA). This EUA will remain  in effect (meaning this test can be used) for the duration of the COVID-19 declaration under Section 564(b)(1) of the Act, 21 U.S.C.section 360bbb-3(b)(1), unless the authorization is terminated  or revoked sooner.  Influenza A by PCR NEGATIVE NEGATIVE Final   Influenza B by PCR NEGATIVE NEGATIVE Final    Comment: (NOTE) The Xpert Xpress SARS-CoV-2/FLU/RSV plus assay is intended as an aid in the diagnosis of influenza from Nasopharyngeal swab specimens and should not be used as a sole basis for treatment. Nasal washings and aspirates are unacceptable for Xpert Xpress SARS-CoV-2/FLU/RSV testing.  Fact Sheet for Patients: EntrepreneurPulse.com.au  Fact Sheet for Healthcare Providers: IncredibleEmployment.be  This test is not yet approved or cleared by the [place] FDA and has been authorized for detection and/or diagnosis of SARS-CoV-2 by FDA under an Emergency Use Authorization (EUA). This EUA will remain in effect (meaning this test can be used) for the duration of the COVID-19 declaration under Section 564(b)(1) of the Act, 21 U.S.C. section 360bbb-3(b)(1), unless the authorization is terminated or revoked.  Performed at [facility], [place] [address] [place], [place] [address]       Radiology Studies: [place] EKG SITE RITE  Result  Date: 11/21/2020 If Site Rite image not attached, placement could not be confirmed due to current cardiac rhythm.   Scheduled Meds: . amiodarone  400 mg Oral BID  . aspirin EC  81 mg Oral Daily  . atorvastatin  80 mg Oral QPM  . buPROPion  300 mg Oral Daily  . carvedilol  3.125 mg Oral BID WC  . escitalopram  20 mg Oral Daily  . furosemide  80 mg Intravenous BID  . heparin  5,000 Units Subcutaneous Q8H  . mexiletine  150 mg Oral Q8H  . multivitamin  1 tablet Oral Daily  . pantoprazole  40 mg Oral QAC breakfast  . potassium chloride  20 mEq Oral Daily  . sacubitril-valsartan  1 tablet Oral BID  . zolpidem  5 mg Oral QHS   Continuous Infusions: . magnesium sulfate bolus IVPB       LOS: 4 days   Time spent: 40 minutes  Nayeli Calvert Loann Quill, MD [facility]  If 7PM-7AM, please contact night-coverage [email or web address] 11/21/2020, 9:53 AM

## 2020-11-21 NOTE — Plan of Care (Signed)
  Problem: Education: Goal: Knowledge of General Education information will improve Description: Including pain rating scale, medication(s)/side effects and non-pharmacologic comfort measures Outcome: Progressing   Problem: Health Behavior/Discharge Planning: Goal: Ability to manage health-related needs will improve Outcome: Progressing   Problem: Activity: Goal: Risk for activity intolerance will decrease Outcome: Progressing   

## 2020-11-21 NOTE — Consult Note (Addendum)
Advanced Heart Failure Team Consult Note   Primary Physician: Hoyt Koch, MD PCP-Cardiologist:  Mertie Moores, MD  Reason for Consultation: Cardiogenic shock   HPI:    [name] is seen today for evaluation of cardiogenic shock at the request of Dr. Burt Knack.    [name] is 74 year old gentleman with a past medical history significant for ADHD, CAD (hx of CABG 1991, with patent grafts on cath 10/23/20), ischemic cardiomyopathy (EF 20-25%, by echo), ICD placement, episodes of ventricular tachycardia, HTN, HL, CKD stage III, and prostate CA  Admitted in 11/21 with VT and HF. Cath 3vCAD with patent graft. Diuresed and started on amio. Echo EF 20-25% mild RV dysfunction. Severe MR.   Readmitted on 11/17/20 with recurrent HF. Developed recurrent VT rate ~120. Started on IV amio and converted to NSR. Since being in the hospital has developed progressive renal failure. Creatinine 1.2 -> 2.0. PICC placed yesterday. Co-ox this am 44%.   At baseline he has been independent but not overly active but was able to take care of his wife when she had her knee replacement 10 weeks ago. Since his hospitalization in 11/21 has had SOB and fatigue with any activity.Very complaint with meds. No bleeding. Retired from [employer] in 2002. Non-smoker. Married 46 years.   Has h/o depression and ADHD. Follows with Dr. Pearson Grippe   Review of Systems: [y] = yes, [ ]  = no   . General: Weight gain [ ] ; Weight loss [ ] ; Anorexia [ ] ; Fatigue [y]; Fever [ ] ; Chills [ ] ; Weakness [ y]  . Cardiac: Chest pain/pressure [ ] ; Resting SOB [ ] ; Exertional SOB [ y]; Orthopnea [ ] ; Pedal Edema Blue.Reese ]; Palpitations [ ] ; Syncope [ ] ; Presyncope [ ] ; Paroxysmal nocturnal dyspnea[ ]   . Pulmonary: Cough [ ] ; Wheezing[ ] ; Hemoptysis[ ] ; Sputum [ ] ; Snoring [ ]   . GI: Vomiting[ ] ; Dysphagia[ ] ; Melena[ ] ; Hematochezia [ ] ; Heartburn[ ] ; Abdominal pain [ ] ; Constipation [ ] ; Diarrhea [ ] ; BRBPR [ ]    . GU: Hematuria[ ] ; Dysuria [ ] ; Nocturia[ ]   . Vascular: Pain in legs with walking [ ] ; Pain in feet with lying flat [ ] ; Non-healing sores [ ] ; Stroke [ ] ; TIA [ ] ; Slurred speech [ ] ;  . Neuro: Headaches[ ] ; Vertigo[ ] ; Seizures[ ] ; Paresthesias[ ] ;Blurred vision [ ] ; Diplopia [ ] ; Vision changes [ ]   . Ortho/Skin: Arthritis Blue.Reese ]; Joint pain [ y]; Muscle pain [ ] ; Joint swelling [ ] ; Back Pain [ ] ; Rash [ ]   . Psych: Depression[y ]; Anxiety[ y]  . Heme: Bleeding problems [ ] ; Clotting disorders [ ] ; Anemia [ ]   . Endocrine: Diabetes [ y]; Thyroid dysfunction[ ]    Past Medical History: Past Medical History:  Diagnosis Date  . Adult ADHD   . AICD (automatic cardioverter/defibrillator) present   . CAD (coronary artery disease)    Cath 2008, all grafts patent // Cath 2017 - all grafts patent   . 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 Carotid artery disease ([place])    Doppler, February, 2012, mild plaque, 0-39% bilateral, plan followup in one year  . Dyspepsia   . Ejection fraction < 50%   . Elevated PSA 02/15/2011  . Esophageal stricture   . GERD (gastroesophageal reflux disease)   . HTN (hypertension)   . Hx of CABG    1991  .  Hyperlipidemia   . 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 Malignant neoplasm of tonsil (HCC)   . Myocardial infarction (HCC) 1998  . Prostate cancer (HCC) 2012   IMRT, ADT  . Thrombosis of arm    Left  . Venous thrombosis    Left arm    Past Surgical History: Past Surgical History:  Procedure Laterality Date  . CARDIAC CATHETERIZATION  12/05/06  . CARDIAC CATHETERIZATION N/A 12/21/2015   Procedure: Left Heart Cath and Coronary Angiography;  Surgeon: Runell Gess, MD;  Location: [facility] INVASIVE CV LAB;  Service: Cardiovascular;  Laterality: N/A;  . CORONARY ARTERY BYPASS GRAFT     LIMA_LAD,  SVG-OM, Cx, RCA '91  . CYSTECTOMY     Hand  . 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 ICD Placement    . IMPLANTABLE CARDIOVERTER DEFIBRILLATOR (ICD) GENERATOR CHANGE  04/06/2015   Procedure: Icd Generator Change;  Surgeon: Marinus Maw, MD;  Location: [facility] INVASIVE CV LAB;  Service: Cardiovascular;;  . LEFT HEART CATH AND CORS/GRAFTS ANGIOGRAPHY N/A 10/23/2020   Procedure: LEFT HEART CATH AND CORS/GRAFTS ANGIOGRAPHY;  Surgeon: Corky Crafts, MD;  Location: [facility] INVASIVE CV LAB;  Service: Cardiovascular;  Laterality: N/A;  . menistectomies, left knee  '09  . ROTATOR CUFF REPAIR  2005, 2007  . Thumb Surgery    . TONSILLECTOMY  2005   Malignant lesion, w/XRT    Family History: Family History  Problem Relation Age of Onset  . Alzheimer's disease Mother   . Dementia Mother        living in [place]  . Hyperlipidemia Father   . Hypertension Father   . CAD Father        had pacemaker  . Congestive Heart Failure Father   . Heart disease Brother        Bypass surgery  . Breast cancer Sister        survivor  . Colon cancer Neg Hx   . Prostate cancer Neg Hx   . Diabetes Neg Hx     Social History: Social History   Socioeconomic History  . Marital status: Married    Spouse name: Not on file  . Number of children: 0  . Years of education: 49  . Highest education level: Not on file  Occupational History  . Occupation: Retired    Associate Professor: RETIRED    CommentChild psychotherapist for [employer]  Tobacco Use  . Smoking status: Never Smoker  . Smokeless tobacco: Never Used  Vaping Use  . Vaping Use: Never used  Substance and Sexual Activity  . Alcohol use: No  . Drug use: No  . Sexual activity: Yes    Birth control/protection: None  Other Topics Concern  . Not on file  Social History Narrative   [employer]. 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 No children. Marriage in good health, wife in good health. ACP discussed Jan '15: no CPR, no  mechanical ventilation. He is referred to [email or web address] and provided with packet with HCPOA-his wife./ Advanced directive.   Fun/Hobby: Yard work, Education officer, environmental working.    Social Determinants of Health   Financial Resource Strain: Not on file  Food Insecurity: Not on file  Transportation Needs: Not on file  Physical Activity: Not on file  Stress: Not on file  Social Connections: Not on file  Allergies:  Allergies  Allergen Reactions  . Other Anaphylaxis    Insect bites (Has EpiPen)    Objective:    Vital Signs:   Temp:  [97.4 F (36.3 C)-98.3 F (36.8 C)] 97.4 F (36.3 C) (12/25 1221) Pulse Rate:  [56-66] 56 (12/25 1221) Resp:  [16-21] 16 (12/25 1221) BP: (108-130)/(76-90) 108/76 (12/25 1221) SpO2:  [94 %-100 %] 98 % (12/25 1221) Weight:  [81.1 kg] 81.1 kg (12/25 0110) Last BM Date: 11/20/20  Weight change: Filed Weights   11/19/20 0422 11/20/20 0425 11/21/20 0110  Weight: 84.3 kg 83.4 kg 81.1 kg    Intake/Output:   Intake/Output Summary (Last 24 hours) at 11/21/2020 1318 Last data filed at 11/21/2020 1222 Gross per 24 hour  Intake 1250.53 ml  Output 1300 ml  Net -49.47 ml      Physical Exam    General:  Weak appearing. No resp difficulty HEENT: normal Neck: supple. JVP to jaw . Carotids 2+ bilat; no bruits. No lymphadenopathy or thyromegaly appreciated. Cor: PMI nondisplaced. Regular rate & rhythm. 3/6 MR Lungs: clear Abdomen: soft, nontender, nondistended. No hepatosplenomegaly. No bruits or masses. Good bowel sounds. Extremities: no cyanosis, clubbing, rash, trace edema Neuro: alert & orientedx3, cranial nerves grossly intact. moves all 4 extremities w/o difficulty. Affect pleasant   Telemetry   Sinus 50-60s Personally reviewed  EKG    NSR 74 RBBB + anterior Qs. Personally reviewed   Labs   Basic Metabolic Panel: Recent Labs  Lab 11/18/20 0257 11/19/20 0301 11/19/20 1052 11/20/20 0123 11/20/20 0801 11/21/20 0242  NA 140 139  135  --  137 138  K 3.1* 4.7 4.0  --  4.2 4.0  CL 106 105 104  --  104 103  CO2 19* 23 19*  --  22 23  GLUCOSE 94 118* 126*  --  129* 98  BUN 26* 26* 28*  --  27* 27*  CREATININE 1.56* 1.72* 1.74*  --  1.82* 1.96*  CALCIUM 8.3* 8.5* 8.2*  --  8.6* 8.4*  MG 1.7 2.0  --  2.0  --  1.9    Liver Function Tests: No results for input(s): AST, ALT, ALKPHOS, BILITOT, PROT, ALBUMIN in the last 168 hours. No results for input(s): LIPASE, AMYLASE in the last 168 hours. No results for input(s): AMMONIA in the last 168 hours.  CBC: Recent Labs  Lab 11/17/20 1644 11/19/20 0301  WBC 7.0 7.2  HGB 10.3* 10.6*  HCT 33.9* 33.0*  MCV 88.5 85.5  PLT 186 189    Cardiac Enzymes: No results for input(s): CKTOTAL, CKMB, CKMBINDEX, TROPONINI in the last 168 hours.  BNP: BNP (last 3 results) Recent Labs    11/17/20 1645  BNP 3,663.4*    ProBNP (last 3 results) No results for input(s): PROBNP in the last 8760 hours.   CBG: No results for input(s): GLUCAP in the last 168 hours.  Coagulation Studies: No results for input(s): LABPROT, INR in the last 72 hours.   Imaging   [place] EKG SITE RITE  Result Date: 11/21/2020 If Site Rite image not attached, placement could not be confirmed due to current cardiac rhythm.     Medications:     Current Medications: . amiodarone  400 mg Oral BID  . aspirin EC  81 mg Oral Daily  . atorvastatin  80 mg Oral QPM  . buPROPion  300 mg Oral Daily  . carvedilol  3.125 mg Oral BID WC  . Chlorhexidine Gluconate Cloth  6 each Topical  Daily  . escitalopram  20 mg Oral Daily  . heparin  5,000 Units Subcutaneous Q8H  . mexiletine  150 mg Oral Q8H  . multivitamin  1 tablet Oral Daily  . pantoprazole  40 mg Oral QAC breakfast  . potassium chloride  20 mEq Oral Daily  . sacubitril-valsartan  1 tablet Oral BID  . sodium chloride flush  10-40 mL Intracatheter Q12H  . sodium chloride flush  3 mL Intravenous Q12H  . zolpidem  5 mg Oral QHS      Infusions:   Assessment/Plan   1.  Acute on chronic systolic heart failure due to iCM, EF 20-25% -> cardiogenic shock - Echo 11/21 EF 202-5% mild RV dysfunction moderate to severe MR - NYHA IV - Low output physiology confirmed this am. Co-ox 44%. CVP pending (~12-14cm on exam)  - Stop b-blocker - Start milrinone 0.25. Follow CVP and co-ox  - Hold Entresto with worsening renal function.  - Plan RHC Monday - Will begin VAD w/u. Seems to be good candidate. I d/w him and his wife and they are interested. VAD packet provided,  2.  Monomorphic ventricular tachycardia -No further recurrence.  ICD VT zones have been adjusted.  Continue amiodarone.   - Keep K> 4.0 Mg > 2.0 -Recent left heart catheterization on 10/23/2020 without any targets for revascularization - Watch for increased ectopy on milrinone  3.  CAD status post CABG -Recent left heart cath with severe three-vessel CAD. -He has patent vein grafts. (LIMA -> LAD, SVG-> RCA, SVG -> OM2 -> OM3) -No symptoms of angina. -Continue aspirin and Lipitor  4.  Severe mitral regurgitation - Consider TEE as needed  5. AKI  - due to cardiorenal/ATN - baseline 1.1 - 1.3-> 1.7 -> 1.8 -> 2.0 - should improve with hemodynamic support  CRITICAL CARE Performed by: Glori Bickers  Total critical care time: 45 minutes  Critical care time was exclusive of separately billable procedures and treating other patients.  Critical care was necessary to treat or prevent imminent or life-threatening deterioration.  Critical care was time spent personally by me (independent of midlevel providers or residents) on the following activities: development of treatment plan with patient and/or surrogate as well as nursing, discussions with consultants, evaluation of patient's response to treatment, examination of patient, obtaining history from patient or surrogate, ordering and performing treatments and interventions, ordering and review of  laboratory studies, ordering and review of radiographic studies, pulse oximetry and re-evaluation of patient's condition.   Length of Stay: [place], MD  11/21/2020, 1:18 PM  Advanced Heart Failure Team Pager [phone] (M-F; 7a - 4p)  Please contact [facility] for night-coverage after hours (4p -7a ) and weekends on [email or web address]

## 2020-11-21 NOTE — Progress Notes (Signed)
Peripherally Inserted Central Catheter Placement  The IV Nurse has discussed with the patient and/or persons authorized to consent for the patient, the purpose of this procedure and the potential benefits and risks involved with this procedure.  The benefits include less needle sticks, lab draws from the catheter, and the patient may be discharged home with the catheter. Risks include, but not limited to, infection, bleeding, blood clot (thrombus formation), and puncture of an artery; nerve damage and irregular heartbeat and possibility to perform a PICC exchange if needed/ordered by physician.  Alternatives to this procedure were also discussed.  Bard Power PICC patient education guide, fact sheet on infection prevention and patient information card has been provided to patient /or left at bedside.    PICC Placement Documentation  PICC Double Lumen 85/27/78 Right Basilic 38 cm 0 cm (Active)  Indication for Insertion or Continuance of Line Chronic illness with exacerbations (CF, Sickle Cell, etc.) 11/21/20 1000  Exposed Catheter (cm) 0 cm 11/21/20 1000  Site Assessment Clean;Dry;Intact 11/21/20 1000  Lumen #1 Status Flushed;Saline locked;Blood return noted 11/21/20 1000  Lumen #2 Status Flushed;Saline locked;Blood return noted 11/21/20 1000  Dressing Type Transparent;Securing device 11/21/20 1000  Dressing Status Clean;Dry;Intact 11/21/20 1000  Antimicrobial disc in place? Yes 11/21/20 1000  Safety Lock Not Applicable 24/23/53 6144  Line Care Connections checked and tightened 11/21/20 1000  Dressing Intervention New dressing 11/21/20 1000  Dressing Change Due 11/28/20 11/21/20 1000       Holley Bouche [place] 11/21/2020, 10:06 AM

## 2020-11-21 NOTE — Progress Notes (Signed)
Cardiology Progress Note  Patient ID: [name] MRN: [id number] DOB: 04/09/1946 Date of Encounter: 11/21/2020  Primary Cardiologist: Mertie Moores, MD  Subjective   Chief Complaint: Short of breath  HPI: Poor diuresis overnight.  Appears euvolemic.  Creatinine has risen.  ROS:  All other ROS reviewed and negative. Pertinent positives noted in the HPI.     Inpatient Medications  Scheduled Meds: . amiodarone  400 mg Oral BID  . aspirin EC  81 mg Oral Daily  . atorvastatin  80 mg Oral QPM  . buPROPion  300 mg Oral Daily  . carvedilol  3.125 mg Oral BID WC  . Chlorhexidine Gluconate Cloth  6 each Topical Daily  . escitalopram  20 mg Oral Daily  . furosemide  80 mg Intravenous BID  . heparin  5,000 Units Subcutaneous Q8H  . mexiletine  150 mg Oral Q8H  . multivitamin  1 tablet Oral Daily  . pantoprazole  40 mg Oral QAC breakfast  . potassium chloride  20 mEq Oral Daily  . sacubitril-valsartan  1 tablet Oral BID  . sodium chloride flush  10-40 mL Intracatheter Q12H  . zolpidem  5 mg Oral QHS   Continuous Infusions: . magnesium sulfate bolus IVPB 2 g (11/21/20 1105)   PRN Meds: acetaminophen **OR** acetaminophen, guaiFENesin-dextromethorphan, ondansetron (ZOFRAN) IV, sodium chloride flush   Vital Signs   Vitals:   11/21/20 0422 11/21/20 0728 11/21/20 0800 11/21/20 0900  BP: 118/79 129/88 113/78   Pulse: 63 66 60   Resp: 20 16 (!) 21   Temp: 98.2 F (36.8 C) 98.3 F (36.8 C)    TempSrc: Oral Oral    SpO2: 94% 97%  100%  Weight:      Height:        Intake/Output Summary (Last 24 hours) at 11/21/2020 1133 Last data filed at 11/21/2020 0900 Gross per 24 hour  Intake 1250.53 ml  Output 1300 ml  Net -49.47 ml   Last 3 Weights 11/21/2020 11/20/2020 11/19/2020  Weight (lbs) 178 lb 12.8 oz 183 lb 13.8 oz 185 lb 13.6 oz  Weight (kg) 81.103 kg 83.4 kg 84.3 kg      Telemetry  Overnight telemetry shows sinus bradycardia with heart rate in the 50s, PVCs,  which I personally reviewed.   ECG  The most recent ECG shows sinus rhythm, heart rate 74, anteroseptal infarct, right bundle branch block, which I personally reviewed.   Physical Exam   Vitals:   11/21/20 0422 11/21/20 0728 11/21/20 0800 11/21/20 0900  BP: 118/79 129/88 113/78   Pulse: 63 66 60   Resp: 20 16 (!) 21   Temp: 98.2 F (36.8 C) 98.3 F (36.8 C)    TempSrc: Oral Oral    SpO2: 94% 97%  100%  Weight:      Height:         Intake/Output Summary (Last 24 hours) at 11/21/2020 1133 Last data filed at 11/21/2020 0900 Gross per 24 hour  Intake 1250.53 ml  Output 1300 ml  Net -49.47 ml    Last 3 Weights 11/21/2020 11/20/2020 11/19/2020  Weight (lbs) 178 lb 12.8 oz 183 lb 13.8 oz 185 lb 13.6 oz  Weight (kg) 81.103 kg 83.4 kg 84.3 kg    Body mass index is 24.25 kg/m.   General: Well nourished, well developed, in no acute distress Head: Atraumatic, normal size  Eyes: PEERLA, EOMI  Neck: Supple, JVD 5 to 7 cm of water Endocrine: No thryomegaly Cardiac: Normal S1, S2; RRR;  3 out of 6 holosystolic murmur Lungs: Clear to auscultation bilaterally, no wheezing, rhonchi or rales  Abd: Soft, nontender, no hepatomegaly  Ext: No edema, pulses 2+ Musculoskeletal: No deformities, BUE and BLE strength normal and equal Skin: Warm and dry, no rashes   Neuro: Alert and oriented to person, place, time, and situation, CNII-XII grossly intact, no focal deficits  Psych: Normal mood and affect   Labs  High Sensitivity Troponin:   Recent Labs  Lab 10/23/20 1044 10/23/20 1717 11/17/20 1644 11/17/20 1828  TROPONINIHS 55* 84* 29* 29*     Cardiac EnzymesNo results for input(s): TROPONINI in the last 168 hours. No results for input(s): TROPIPOC in the last 168 hours.  Chemistry Recent Labs  Lab 11/19/20 1052 11/20/20 0801 11/21/20 0242  NA 135 137 138  K 4.0 4.2 4.0  CL 104 104 103  CO2 19* 22 23  GLUCOSE 126* 129* 98  BUN 28* 27* 27*  CREATININE 1.74* 1.82* 1.96*  CALCIUM  8.2* 8.6* 8.4*  GFRNONAA 41* 39* 35*  ANIONGAP 12 11 12     Hematology Recent Labs  Lab 11/17/20 1644 11/19/20 0301  WBC 7.0 7.2  RBC 3.83* 3.86*  HGB 10.3* 10.6*  HCT 33.9* 33.0*  MCV 88.5 85.5  MCH 26.9 27.5  MCHC 30.4 32.1  RDW 15.0 15.0  PLT 186 189   BNP Recent Labs  Lab 11/17/20 1645  BNP 3,663.4*    DDimer No results for input(s): DDIMER in the last 168 hours.   Radiology  [place] EKG SITE RITE  Result Date: 11/21/2020 If Site Rite image not attached, placement could not be confirmed due to current cardiac rhythm.   Cardiac Studies  TTE 10/24/2020 1. There is no left ventricular thrombus. Left ventricular ejection  fraction, by estimation, is 20 to 25%. The left ventricle has severely  decreased function. The left ventricle demonstrates regional wall motion  abnormalities (see scoring  diagram/findings for description). The left ventricular internal cavity  size was moderately to severely dilated. Left ventricular diastolic  parameters are consistent with Grade II diastolic dysfunction  (pseudonormalization). Elevated left atrial  pressure. There is mild apical dyskinesis. There is akinesis of the  mid-apical anterior wall and septum and the inferior apex.  2. Right ventricular systolic function is mildly reduced. The right  ventricular size is normal. There is normal pulmonary artery systolic  pressure.  3. Left atrial size was severely dilated.  4. There is mitral leaflet malcoaptation due to adverse chamber  remodeling. Effective regurgitant orifice area 0.22 cm, regurgitant  volume 43 ml, regurgitant fraction 55%. The mitral valve is grossly  normal. Moderate to severe mitral valve  regurgitation. No evidence of mitral stenosis.  5. The aortic valve is tricuspid. Aortic valve regurgitation is mild.  Mild aortic valve sclerosis is present, with no evidence of aortic valve  stenosis.  6. The inferior vena cava is normal in size with greater than 50%   respiratory variability, suggesting right atrial pressure of 3 mmHg.   LHC 10/23/2020   Ost LAD to Prox LAD lesion is 100% stenosed. Patent LIMA to LAD.  Ost RCA to Prox RCA lesion is 100% stenosed. Patent SVG to RCA.  Mid Cx lesion is 100% stenosed. Patent jump SVG to OM2/OM3  1st Mrg lesion is 50% stenosed. This is a small vessel, unchanged from prior cath.  LV end diastolic pressure is mildly elevated. LVEDP 20 mm Hg.  There is no aortic valve stenosis.  Tortuous left subclavian. Versacore wire  used to navigate.   Continue medical therapy for systolic heart failure.  Further management per EP for VT.  Left message for wife [phone].  Unable to find her in the [facility] waiting room.    Patient Profile  [name] is a 74 y.o. male with CAD status post CABG, CKD stage III, hypertension, systolic heart failure EF 20-25% with severe MR who was admitted on 11/18/2020 for ventricular tachycardia.  Assessment & Plan   1.  Monomorphic ventricular tachycardia -No further recurrence.  ICD VT zones have been adjusted.  Continue amiodarone.  EP added mexiletine yesterday.  Maintain adequate electrolytes. -He is euvolemic.  He has a PICC line.  I will send a coox. -Suspect his cardiomyopathy was driving this. -Recent left heart catheterization on 10/23/2020 without any targets for revascularization  2.  Acute on chronic systolic heart failure, EF 20-25% -We will repeat a limited echo to look at his EF and MR -He has a central line.  We will send a coox.  He appears euvolemic to me.  We also asked for CVP  -creatinine up.  We will stop diuresis.  He appears euvolemic to me. -Continue Coreg, Entresto. -Add Aldactone as kidney function improves -SGLT2 as kidney function improves  -He is still short of breath but appears euvolemic.  Could be approaching stage D heart failure. Narrow PP concerning for low-output. I will talk with Dr. Pierre Bali about evaluation today. -He will  need a right heart catheterization on Monday.  We will get an idea of what his cardiac output is on his coox.  May need milrinone.  3.  CAD status post CABG -Recent left heart cath with severe three-vessel CAD. -He has patent vein grafts. -No symptoms of angina. -Continue aspirin and Lipitor  4.  Severe mitral regurgitation -We can go ahead and plan for TEE on Monday as well.  This will evaluate him for MitraClip.    For questions or updates, please contact [place] Please consult [email or web address] for contact info under   Time Spent with Patient: I have spent a total of 35 minutes with patient reviewing hospital notes, telemetry, EKGs, labs and examining the patient as well as establishing an assessment and plan that was discussed with the patient.  > 50% of time was spent in direct patient care.    Signed, Addison Naegeli. Audie Box, [place]  11/21/2020 11:33 AM

## 2020-11-22 ENCOUNTER — Inpatient Hospital Stay (HOSPITAL_COMMUNITY): Payer: Medicare Other

## 2020-11-22 DIAGNOSIS — I472 Ventricular tachycardia: Secondary | ICD-10-CM | POA: Diagnosis not present

## 2020-11-22 DIAGNOSIS — I5021 Acute systolic (congestive) heart failure: Secondary | ICD-10-CM | POA: Diagnosis not present

## 2020-11-22 LAB — ECHOCARDIOGRAM LIMITED
Height: 72 in
MV M vel: 5.52 m/s
MV Peak grad: 121.9 mmHg
Radius: 0.7 cm
S' Lateral: 5.4 cm
Weight: 2892.44 oz

## 2020-11-22 LAB — BASIC METABOLIC PANEL
Anion gap: 11 (ref 5–15)
BUN: 23 mg/dL (ref 8–23)
CO2: 25 mmol/L (ref 22–32)
Calcium: 8.3 mg/dL — ABNORMAL LOW (ref 8.9–10.3)
Chloride: 102 mmol/L (ref 98–111)
Creatinine, Ser: 1.81 mg/dL — ABNORMAL HIGH (ref 0.61–1.24)
GFR, Estimated: 39 mL/min — ABNORMAL LOW (ref >60)
Glucose, Bld: 85 mg/dL (ref 70–99)
Potassium: 3.2 mmol/L — ABNORMAL LOW (ref 3.5–5.1)
Sodium: 138 mmol/L (ref 135–145)

## 2020-11-22 LAB — MAGNESIUM: Magnesium: 2 mg/dL (ref 1.7–2.4)

## 2020-11-22 LAB — COOXEMETRY PANEL
Carboxyhemoglobin: 1.4 % (ref 0.5–1.5)
Methemoglobin: 1 % (ref 0.0–1.5)
O2 Saturation: 63.8 %
Total hemoglobin: 10.2 g/dL — ABNORMAL LOW (ref 12.0–16.0)

## 2020-11-22 MED ORDER — LOPERAMIDE HCL 2 MG PO CAPS
2.0000 mg | ORAL_CAPSULE | ORAL | Status: DC | PRN
  Administered 2020-11-22 – 2020-11-30 (×4): 2 mg via ORAL
  Filled 2020-11-22 (×4): qty 1

## 2020-11-22 MED ORDER — HEPARIN BOLUS VIA INFUSION
4000.0000 [IU] | Freq: Once | INTRAVENOUS | Status: AC
  Administered 2020-11-22: 4000 [IU] via INTRAVENOUS
  Filled 2020-11-22: qty 4000

## 2020-11-22 MED ORDER — SODIUM CHLORIDE 0.9% FLUSH: 3.0000 mL | INTRAVENOUS | Status: DC | PRN

## 2020-11-22 MED ORDER — SODIUM CHLORIDE 0.9 % IV SOLN: INTRAVENOUS | Status: DC

## 2020-11-22 MED ORDER — POTASSIUM CHLORIDE 20 MEQ PO PACK
40.0000 meq | PACK | Freq: Two times a day (BID) | ORAL | Status: AC
  Administered 2020-11-22 (×2): 40 meq via ORAL
  Filled 2020-11-22 (×2): qty 2

## 2020-11-22 MED ORDER — PERFLUTREN LIPID MICROSPHERE
1.0000 mL | INTRAVENOUS | Status: AC | PRN
  Administered 2020-11-22: 5 mL via INTRAVENOUS
  Filled 2020-11-22: qty 10

## 2020-11-22 MED ORDER — SODIUM CHLORIDE 0.9 % IV SOLN: 250.0000 mL | INTRAVENOUS | Status: DC | PRN

## 2020-11-22 MED ORDER — HEPARIN (PORCINE) 25000 UT/250ML-% IV SOLN
1300.0000 [IU]/h | INTRAVENOUS | Status: DC
  Administered 2020-11-22: 1300 [IU]/h via INTRAVENOUS
  Filled 2020-11-22: qty 250

## 2020-11-22 NOTE — Progress Notes (Signed)
2nd Peripheral IV not inserted as standard for right heart cath as patient has a Right PICC line and a left implanted AICD. MD will use alternative site in the cath lab for procedure

## 2020-11-22 NOTE — Progress Notes (Signed)
PROGRESS NOTE    [name]  RSW:[id number] DOB: 01-12-1946 DOA: 11/17/2020 PCP: Hoyt Koch, MD   Brief Narrative:  [name] is a 74 y.o. male with medical history significant of CAD status post CABG and PCI, chronic combined systolic and diastolic CHF/ischemic cardiomyopathy with LVEF 20-25% and history of VT status post ICD, hypertension, hyperlipidemia, CKD stage IIIa, history of prostate and tonsillar cancer, history of DVT presenting to the ED with complaints of shortness of breath.  Patient reports dyspnea on exertion and cough for the past 3 to 4 days.  He was having low-grade fevers earlier but has not had them for the past few days.  He has also had 3 episodes of vomiting over the past week which has now resolved.  Denies abdominal pain or diarrhea.  States his ICD had shocked him prior to his recent hospitalization but he has not had any issues lately.  He is vaccinated against Covid.  States he has been checked for Covid multiple times recently and every time results came back negative.  ED Course: Afebrile.  Tachypneic with respiratory rate in the 20s.  Not tachycardic.  Not hypoxic.  WBC 7.0, hemoglobin 10.3, hematocrit 33.9, platelet 186K.  Sodium 140, potassium 3.7, chloride 107, bicarb 22, BUN 27, creatinine 1.7, glucose 106.  High-sensitivity troponin mildly elevated but stable (29 >29).  BNP significantly elevated at 3663.  SARS-CoV-2 PCR test and influenza panel both negative.  Chest x-ray showing cardiomegaly and small effusions in the posterior costophrenic angles; no frank edema.Patient was given IV Lasix 20 mg.  Assessment & Plan:  Acute on chronic combined systolic and diastolic CHF: -Patient presented with signs of fluid overload.  Chest x-ray shows cardiomegaly and small effusion in the posterior costophrenic angles.  -On admission: Has bibasilar Rales, JVD and significantly elevated BNP of 3663. -Echo from 10/24/2020 showing severely  reduced ejection fraction of 20 to 09%, grade 2 diastolic dysfunction, moderate to severe MR. -Lasix and Coreg was discontinued and patient started on milrinone per cardiology -Strict INO's and daily weight.  Monitor signs of fluid overload.  Monitor electrolytes closely. -Keep potassium more than 4, magnesium more than 2.    Monomorphic ventricular tachycardia: -Was given loading dose of amiodarone and then was placed on GGT-currently on amiodarone 400 mg twice daily.  Has been in sinus rhythm for now. -Continue mexiletine -Appreciate cardiology and EP team assistance  AKI on CKD stage IIIa: -Kidney function improved this morning. -Entresto and Lasix was discontinued by cardiology. -Continue to monitor kidney function closely. -Repeat BMP tomorrow a.m.  Severe MR: -Plan is for right heart cath tomorrow.   -Appreciate cardiology assistance.  History of coronary artery disease status post CABG and PCI: Patient denies ACS symptoms.  Troponin mildly elevated but stable 29x2.  Suspected demand ischemia from decompensated CHF. -Continue aspirin, and statin  Hypokalemia: Replenished.  Magnesium level: WNL.  Repeat BMP tomorrow a.m.  Hypertension: Stable.  Continue Coreg and Entresto. Monitor blood pressure closely.  Hyperlipidemia: Continue Lipitor  GERD: Continue PPI  Hypokalemia and hypomagnesemia: Replenished.  Repeat labs tomorrow  Depression/anxiety: Continue Lexapro, Wellbutrin and Ambien  DVT prophylaxis: Heparin Code Status: None Family Communication: None present at bedside.  Plan of care discussed with patient in length and he verbalized understanding and agreed with it. Disposition Plan: Likely home in 1 to 2 days  Consultants:   Cardiology  Procedures:   None  Antimicrobials:   None  Status is: Inpatient   Dispo: The  patient is from: Home              Anticipated d/c is to: Home              Anticipated d/c date is: 2 days              Patient currently  is not medically stable to d/c.   Subjective: Patient seen and examined.  Resting comfortably on the bed.  Denies shortness of breath, chest pain, leg swelling, orthopnea, PND.  Reports dry cough but does not want to take medicines for that.  No acute events overnight   Objective: Vitals:   11/22/20 0000 11/22/20 0013 11/22/20 0456 11/22/20 0900  BP:  124/71 119/67 108/66  Pulse:  65 66 61  Resp:  20 18 (!) 22  Temp:  98.3 F (36.8 C) 98.2 F (36.8 C) 98 F (36.7 C)  TempSrc:  Oral Oral Oral  SpO2:  93% 94% 95%  Weight: 82 kg     Height:        Intake/Output Summary (Last 24 hours) at 11/22/2020 1350 Last data filed at 11/22/2020 1200 Gross per 24 hour  Intake 485.35 ml  Output 550 ml  Net -64.65 ml   Filed Weights   11/20/20 0425 11/21/20 0110 11/22/20 0000  Weight: 83.4 kg 81.1 kg 82 kg    Examination:  General exam: Appears calm and comfortable, on room air, elderly,  communicating well Respiratory system: Clear to auscultation. Respiratory effort normal. Cardiovascular system: S1 & S2 heard, RRR. No JVD, murmurs, rubs, gallops or clicks. No pedal edema. Gastrointestinal system: Abdomen is nondistended, soft and nontender. No organomegaly or masses felt. Normal bowel sounds heard. Central nervous system: Alert and oriented. No focal neurological deficits. Extremities: Symmetric 5 x 5 power. Skin: No rashes, lesions or ulcers Psychiatry: Judgement and insight appear normal. Mood & affect appropriate.    Data Reviewed: I have personally reviewed following labs and imaging studies  CBC: Recent Labs  Lab 11/17/20 1644 11/19/20 0301  WBC 7.0 7.2  HGB 10.3* 10.6*  HCT 33.9* 33.0*  MCV 88.5 85.5  PLT 186 [id number]   Basic Metabolic Panel: Recent Labs  Lab 11/18/20 0257 11/19/20 0301 11/19/20 1052 11/20/20 0123 11/20/20 0801 11/21/20 0242 11/22/20 0539  NA 140 139 135  --  137 138 138  K 3.1* 4.7 4.0  --  4.2 4.0 3.2*  CL 106 105 104  --  104 103 102  CO2  19* 23 19*  --  22 23 25   GLUCOSE 94 118* 126*  --  129* 98 85  BUN 26* 26* 28*  --  27* 27* 23  CREATININE 1.56* 1.72* 1.74*  --  1.82* 1.96* 1.81*  CALCIUM 8.3* 8.5* 8.2*  --  8.6* 8.4* 8.3*  MG 1.7 2.0  --  2.0  --  1.9 2.0   GFR: Estimated Creatinine Clearance: 39.9 mL/min (A) (by C-G formula based on SCr of 1.81 mg/dL (H)). Liver Function Tests: No results for input(s): AST, ALT, ALKPHOS, BILITOT, PROT, ALBUMIN in the last 168 hours. No results for input(s): LIPASE, AMYLASE in the last 168 hours. No results for input(s): AMMONIA in the last 168 hours. Coagulation Profile: No results for input(s): INR, PROTIME in the last 168 hours. Cardiac Enzymes: No results for input(s): CKTOTAL, CKMB, CKMBINDEX, TROPONINI in the last 168 hours. BNP (last 3 results) No results for input(s): PROBNP in the last 8760 hours. HbA1C: No results for input(s): HGBA1C in the  last 72 hours. CBG: No results for input(s): GLUCAP in the last 168 hours. Lipid Profile: No results for input(s): CHOL, HDL, LDLCALC, TRIG, CHOLHDL, LDLDIRECT in the last 72 hours. Thyroid Function Tests: No results for input(s): TSH, T4TOTAL, FREET4, T3FREE, THYROIDAB in the last 72 hours. Anemia Panel: No results for input(s): VITAMINB12, FOLATE, FERRITIN, TIBC, IRON, RETICCTPCT in the last 72 hours. Sepsis Labs: Recent Labs  Lab 11/18/20 1254 11/19/20 0301 11/20/20 0123  PROCALCITON 0.10 <0.10 0.13    Recent Results (from the past 240 hour(s))  Novel Coronavirus, NAA ([facility])     Status: None   Collection Time: 11/13/20 10:09 AM   Specimen: Nasopharyngeal(NP) swabs in vial transport medium   Nasopharynge  Result Value Ref Range Status   SARS-CoV-2, NAA Not Detected Not Detected Final    Comment: This nucleic acid amplification test was developed and its performance characteristics determined by [employer]. Nucleic acid amplification tests include RT-PCR and TMA. This test has not been FDA cleared or  approved. This test has been authorized by FDA under an Emergency Use Authorization (EUA). This test is only authorized for the duration of time the declaration that circumstances exist justifying the authorization of the emergency use of in vitro diagnostic tests for detection of SARS-CoV-2 virus and/or diagnosis of COVID-19 infection under section 564(b)(1) of the Act, 21 U.S.C. [id number]) (1), unless the authorization is terminated or revoked sooner. When diagnostic testing is negative, the possibility of a false negative result should be considered in the context of a patient's recent exposures and the presence of clinical signs and symptoms consistent with COVID-19. An individual without symptoms of COVID-19 and who is not shedding SARS-CoV-2 virus wo uld expect to have a negative (not detected) result in this assay.   SARS-COV-2, NAA 2 DAY TAT     Status: None   Collection Time: 11/13/20 10:09 AM   Nasopharynge  Result Value Ref Range Status   SARS-CoV-2, NAA 2 DAY TAT Performed  Final  Resp Panel by RT-PCR (Flu A&B, Covid) Nasopharyngeal Swab     Status: None   Collection Time: 11/17/20 10:35 PM   Specimen: Nasopharyngeal Swab; Nasopharyngeal(NP) swabs in vial transport medium  Result Value Ref Range Status   SARS Coronavirus 2 by RT PCR NEGATIVE NEGATIVE Final    Comment: (NOTE) SARS-CoV-2 target nucleic acids are NOT DETECTED.  The SARS-CoV-2 RNA is generally detectable in upper respiratory specimens during the acute phase of infection. The lowest concentration of SARS-CoV-2 viral copies this assay can detect is 138 copies/mL. A negative result does not preclude SARS-Cov-2 infection and should not be used as the sole basis for treatment or other patient management decisions. A negative result may occur with  improper specimen collection/handling, submission of specimen other than nasopharyngeal swab, presence of viral mutation(s) within the areas targeted by this assay,  and inadequate number of viral copies(<138 copies/mL). A negative result must be combined with clinical observations, patient history, and epidemiological information. The expected result is Negative.  Fact Sheet for Patients:  EntrepreneurPulse.com.au  Fact Sheet for Healthcare Providers:  IncredibleEmployment.be  This test is no t yet approved or cleared by the [place] FDA and  has been authorized for detection and/or diagnosis of SARS-CoV-2 by FDA under an Emergency Use Authorization (EUA). This EUA will remain  in effect (meaning this test can be used) for the duration of the COVID-19 declaration under Section 564(b)(1) of the Act, 21 U.S.C.section 360bbb-3(b)(1), unless the authorization is terminated  or revoked  sooner.       Influenza A by PCR NEGATIVE NEGATIVE Final   Influenza B by PCR NEGATIVE NEGATIVE Final    Comment: (NOTE) The Xpert Xpress SARS-CoV-2/FLU/RSV plus assay is intended as an aid in the diagnosis of influenza from Nasopharyngeal swab specimens and should not be used as a sole basis for treatment. Nasal washings and aspirates are unacceptable for Xpert Xpress SARS-CoV-2/FLU/RSV testing.  Fact Sheet for Patients: EntrepreneurPulse.com.au  Fact Sheet for Healthcare Providers: IncredibleEmployment.be  This test is not yet approved or cleared by the [place] FDA and has been authorized for detection and/or diagnosis of SARS-CoV-2 by FDA under an Emergency Use Authorization (EUA). This EUA will remain in effect (meaning this test can be used) for the duration of the COVID-19 declaration under Section 564(b)(1) of the Act, 21 U.S.C. section 360bbb-3(b)(1), unless the authorization is terminated or revoked.  Performed at [facility], [place] [address], [place],  [address]       Radiology Studies: [place] EKG SITE RITE  Result Date: 11/21/2020 If Site Rite image  not attached, placement could not be confirmed due to current cardiac rhythm.   Scheduled Meds: . amiodarone  400 mg Oral BID  . aspirin EC  81 mg Oral Daily  . atorvastatin  80 mg Oral QPM  . buPROPion  300 mg Oral Daily  . Chlorhexidine Gluconate Cloth  6 each Topical Daily  . escitalopram  20 mg Oral Daily  . heparin  5,000 Units Subcutaneous Q8H  . mexiletine  150 mg Oral Q8H  . multivitamin  1 tablet Oral Daily  . pantoprazole  40 mg Oral QAC breakfast  . potassium chloride  40 mEq Oral BID  . potassium chloride  20 mEq Oral Daily  . sodium chloride flush  10-40 mL Intracatheter Q12H  . sodium chloride flush  3 mL Intravenous Q12H  . zolpidem  5 mg Oral QHS   Continuous Infusions: . milrinone 0.25 mcg/kg/min (11/22/20 0730)     LOS: 5 days   Time spent: 40 minutes  Peter Mcmiller Loann Quill, MD [facility]  If 7PM-7AM, please contact night-coverage [email or web address] 11/22/2020, 1:50 PM

## 2020-11-22 NOTE — Progress Notes (Signed)
  Echocardiogram 2D Echocardiogram has been performed with Definity.  [name] 11/22/2020, 5:04 PM

## 2020-11-22 NOTE — Progress Notes (Signed)
Advanced Heart Failure Rounding Note   Subjective:    Milrinone started yesterday. Feels better. Denies SOB, orthopnea or PND. Legs stil feel weak.   CVP 6. Co-ox  44%-> 64%   Objective:   Weight Range:  Vital Signs:   Temp:  [98 F (36.7 C)-98.3 F (36.8 C)] 98.2 F (36.8 C) (12/26 1300) Pulse Rate:  [59-66] 59 (12/26 1300) Resp:  [18-22] 22 (12/26 1300) BP: (103-124)/(63-71) 103/63 (12/26 1300) SpO2:  [93 %-98 %] 95 % (12/26 1300) Weight:  [82 kg] 82 kg (12/26 0000) Last BM Date: 11/21/20  Weight change: Filed Weights   11/20/20 0425 11/21/20 0110 11/22/20 0000  Weight: 83.4 kg 81.1 kg 82 kg    Intake/Output:   Intake/Output Summary (Last 24 hours) at 11/22/2020 1555 Last data filed at 11/22/2020 1200 Gross per 24 hour  Intake 485.35 ml  Output 550 ml  Net -64.65 ml     Physical Exam: General:  Sitting up in bed  No resp difficulty HEENT: normal Neck: supple. JVP 6 . Carotids 2+ bilat; no bruits. No lymphadenopathy or thryomegaly appreciated. Cor: PMI nondisplaced. Regular rate & rhythm. 3/6 MR Lungs: clear Abdomen: soft, nontender, nondistended. No hepatosplenomegaly. No bruits or masses. Good bowel sounds. Extremities: no cyanosis, clubbing, rash, edema Neuro: alert & orientedx3, cranial nerves grossly intact. moves all 4 extremities w/o difficulty. Affect pleasant  Telemetry:  Sinus 50-60s Personally reviewed  Labs: Basic Metabolic Panel: Recent Labs  Lab 11/18/20 0257 11/19/20 0301 11/19/20 1052 11/20/20 0123 11/20/20 0801 11/21/20 0242 11/22/20 0539  NA 140 139 135  --  137 138 138  K 3.1* 4.7 4.0  --  4.2 4.0 3.2*  CL 106 105 104  --  104 103 102  CO2 19* 23 19*  --  22 23 25   GLUCOSE 94 118* 126*  --  129* 98 85  BUN 26* 26* 28*  --  27* 27* 23  CREATININE 1.56* 1.72* 1.74*  --  1.82* 1.96* 1.81*  CALCIUM 8.3* 8.5* 8.2*  --  8.6* 8.4* 8.3*  MG 1.7 2.0  --  2.0  --  1.9 2.0    Liver Function Tests: No results for input(s): AST,  ALT, ALKPHOS, BILITOT, PROT, ALBUMIN in the last 168 hours. No results for input(s): LIPASE, AMYLASE in the last 168 hours. No results for input(s): AMMONIA in the last 168 hours.  CBC: Recent Labs  Lab 11/17/20 1644 11/19/20 0301  WBC 7.0 7.2  HGB 10.3* 10.6*  HCT 33.9* 33.0*  MCV 88.5 85.5  PLT 186 189    Cardiac Enzymes: No results for input(s): CKTOTAL, CKMB, CKMBINDEX, TROPONINI in the last 168 hours.  BNP: BNP (last 3 results) Recent Labs    11/17/20 1645  BNP 3,663.4*    ProBNP (last 3 results) No results for input(s): PROBNP in the last 8760 hours.    Other results:  Imaging: [place] EKG SITE RITE  Result Date: 11/21/2020 If Site Rite image not attached, placement could not be confirmed due to current cardiac rhythm.     Medications:     Scheduled Medications: . amiodarone  400 mg Oral BID  . aspirin EC  81 mg Oral Daily  . atorvastatin  80 mg Oral QPM  . buPROPion  300 mg Oral Daily  . Chlorhexidine Gluconate Cloth  6 each Topical Daily  . escitalopram  20 mg Oral Daily  . heparin  5,000 Units Subcutaneous Q8H  . mexiletine  150 mg Oral Q8H  .  multivitamin  1 tablet Oral Daily  . pantoprazole  40 mg Oral QAC breakfast  . potassium chloride  40 mEq Oral BID  . potassium chloride  20 mEq Oral Daily  . sodium chloride flush  10-40 mL Intracatheter Q12H  . sodium chloride flush  3 mL Intravenous Q12H  . zolpidem  5 mg Oral QHS     Infusions: . milrinone 0.25 mcg/kg/min (11/22/20 0730)     PRN Medications:  acetaminophen **OR** acetaminophen, guaiFENesin-dextromethorphan, loperamide, ondansetron (ZOFRAN) IV, sodium chloride flush   Assessment/Plan:   1.Acute on chronic systolic heart failure due to iCM, EF 20-25% -> cardiogenic shock - Echo 11/21 EF 202-5% mild RV dysfunction moderate to severe MR - NYHA IV - Low output physiology confirmed Co-ox (12/25) 44%. - Milrinone 0.25 started on 12/25. Co-ox 64% today - Off b-blocker with  shock - Off Entresto with worsening renal function.  - Plan RHC tomorrow via IJ approach - Will begin VAD w/u. Seems to be good candidate. I d/w him and his wife and they are interested. VAD packet provided  2.Monomorphic ventricular tachycardia -No further recurrence. ICD VT zones have been adjusted. Continue amiodarone.  - Keep K> 4.0 Mg > 2.0 -Recent left heart catheterization on 10/23/2020 without any targets for revascularization - Watch for increased ectopy on milrinone. Stable so far  3.CAD status post CABG -Recent left heart cath with severe three-vessel CAD. -He has patent vein grafts. (LIMA -> LAD, SVG-> RCA, SVG -> OM2 -> OM3) -No symptoms of angina. -Continue aspirin and Lipitor  4.Severe mitral regurgitation - Consider TEE as needed  5. AKI  - due to cardiorenal/ATN - baseline 1.1 - 1.3-> 1.7 -> 1.8 -> 2.0 -> 1.8 - improving with hemodynamic support  6. Hypokalemia - will supp  Length of Stay: 5   Arvilla Meres MD 11/22/2020, 3:55 PM  Advanced Heart Failure Team Pager [phone] (M-F; 7a - 4p)  Please contact [facility] for night-coverage after hours (4p -7a ) and weekends on [email or web address]

## 2020-11-22 NOTE — Progress Notes (Signed)
ANTICOAGULATION CONSULT NOTE - Initial Consult  Pharmacy Consult for heparin Indication: LV thrombus 12/26   Allergies  Allergen Reactions  . Other Anaphylaxis    Insect bites (Has EpiPen)    Patient Measurements: Height: 6' (182.9 cm) Weight: 82 kg (180 lb 12.4 oz) IBW/kg (Calculated) : 77.6 Heparin Dosing Weight: 82kg  Vital Signs: Temp: 98.2 F (36.8 C) (12/26 1300) Temp Source: Oral (12/26 1300) BP: 103/63 (12/26 1300) Pulse Rate: 59 (12/26 1300)  Labs: Recent Labs    11/20/20 0801 11/21/20 0242 11/22/20 0539  CREATININE 1.82* 1.96* 1.81*    Estimated Creatinine Clearance: 39.9 mL/min (A) (by C-G formula based on SCr of 1.81 mg/dL (H)).   Medical History: Past Medical History:  Diagnosis Date  . Adult ADHD   . AICD (automatic cardioverter/defibrillator) present   . CAD (coronary artery disease)    Cath 2008, all grafts patent // Cath 2017 - all grafts patent   . 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 Carotid artery disease ([place])    Doppler, February, 2012, mild plaque, 0-39% bilateral, plan followup in one year  . Dyspepsia   . Ejection fraction < 50%   . Elevated PSA 02/15/2011  . Esophageal stricture   . GERD (gastroesophageal reflux disease)   . HTN (hypertension)   . Hx of CABG    1991  . Hyperlipidemia   . 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 Malignant neoplasm of tonsil ([place])   . Myocardial infarction ([place]) 1998  . Prostate cancer ([place]) 2012   IMRT, ADT  . Thrombosis of arm    Left  . Venous thrombosis    Left arm      Assessment: 74 yo M with large laminated mural apical thrombus found on TTE 11/22/20, originally admitted for HF exacerbation. Pharmacy consulted for IV heparin. No AC PTA.   Last received subcutaneous heparin at 1430. ClCr ~40 ml/min. H/H 10.6, plt  stable.   Goal of Therapy:  Heparin level 0.3-0.7 units/ml Monitor platelets by anticoagulation protocol: Yes   Plan:  Heparin 4000 units x1 then 1300 units/hr Monitor daily HL, CBC/plt Monitor for signs/symptoms of bleeding  F/u long term anticoagulation plan  Benetta Spar, PharmD, BCPS, BCCP Clinical Pharmacist  Please check AMION for all [place] phone numbers After 10:00 PM, call [place]

## 2020-11-23 ENCOUNTER — Encounter (HOSPITAL_COMMUNITY): Payer: Self-pay | Admitting: Internal Medicine

## 2020-11-23 ENCOUNTER — Encounter (HOSPITAL_COMMUNITY)
Admission: EM | Disposition: A | Discharge status: Hospice | Payer: Self-pay | Source: Home / Self Care | Attending: Internal Medicine

## 2020-11-23 ENCOUNTER — Inpatient Hospital Stay (HOSPITAL_COMMUNITY): Payer: Medicare Other

## 2020-11-23 HISTORY — PX: RIGHT HEART CATH: CATH118263

## 2020-11-23 LAB — POCT I-STAT EG7
Acid-base deficit: 1 mmol/L (ref 0.0–2.0)
Acid-base deficit: 1 mmol/L (ref 0.0–2.0)
Bicarbonate: 23.6 mmol/L (ref 20.0–28.0)
Bicarbonate: 23.8 mmol/L (ref 20.0–28.0)
Calcium, Ion: 1.06 mmol/L — ABNORMAL LOW (ref 1.15–1.40)
Calcium, Ion: 1.18 mmol/L (ref 1.15–1.40)
HCT: 30 % — ABNORMAL LOW (ref 39.0–52.0)
HCT: 32 % — ABNORMAL LOW (ref 39.0–52.0)
Hemoglobin: 10.2 g/dL — ABNORMAL LOW (ref 13.0–17.0)
Hemoglobin: 10.9 g/dL — ABNORMAL LOW (ref 13.0–17.0)
O2 Saturation: 59 %
O2 Saturation: 61 %
Potassium: 3.9 mmol/L (ref 3.5–5.1)
Potassium: 4.1 mmol/L (ref 3.5–5.1)
Sodium: 143 mmol/L (ref 135–145)
Sodium: 144 mmol/L (ref 135–145)
TCO2: 25 mmol/L (ref 22–32)
TCO2: 25 mmol/L (ref 22–32)
pCO2, Ven: 38.1 mmHg — ABNORMAL LOW (ref 44.0–60.0)
pCO2, Ven: 38.4 mmHg — ABNORMAL LOW (ref 44.0–60.0)
pH, Ven: 7.399 (ref 7.250–7.430)
pH, Ven: 7.4 (ref 7.250–7.430)
pO2, Ven: 31 mmHg — CL (ref 32.0–45.0)
pO2, Ven: 32 mmHg (ref 32.0–45.0)

## 2020-11-23 LAB — PREALBUMIN: Prealbumin: 13 mg/dL — ABNORMAL LOW (ref 18–38)

## 2020-11-23 LAB — CBC
HCT: 30.2 % — ABNORMAL LOW (ref 39.0–52.0)
Hemoglobin: 9.5 g/dL — ABNORMAL LOW (ref 13.0–17.0)
MCH: 26.5 pg (ref 26.0–34.0)
MCHC: 31.5 g/dL (ref 30.0–36.0)
MCV: 84.1 fL (ref 80.0–100.0)
Platelets: 232 10*3/uL (ref 150–400)
RBC: 3.59 MIL/uL — ABNORMAL LOW (ref 4.22–5.81)
RDW: 15.2 % (ref 11.5–15.5)
WBC: 5.8 10*3/uL (ref 4.0–10.5)
nRBC: 0 % (ref 0.0–0.2)

## 2020-11-23 LAB — COOXEMETRY PANEL
Carboxyhemoglobin: 1.3 % (ref 0.5–1.5)
Methemoglobin: 0.8 % (ref 0.0–1.5)
O2 Saturation: 60.7 %
Total hemoglobin: 10 g/dL — ABNORMAL LOW (ref 12.0–16.0)

## 2020-11-23 LAB — BASIC METABOLIC PANEL
Anion gap: 10 (ref 5–15)
BUN: 17 mg/dL (ref 8–23)
CO2: 23 mmol/L (ref 22–32)
Calcium: 8.2 mg/dL — ABNORMAL LOW (ref 8.9–10.3)
Chloride: 106 mmol/L (ref 98–111)
Creatinine, Ser: 1.53 mg/dL — ABNORMAL HIGH (ref 0.61–1.24)
GFR, Estimated: 48 mL/min — ABNORMAL LOW (ref >60)
Glucose, Bld: 93 mg/dL (ref 70–99)
Potassium: 4 mmol/L (ref 3.5–5.1)
Sodium: 139 mmol/L (ref 135–145)

## 2020-11-23 LAB — HEPARIN LEVEL (UNFRACTIONATED)
Heparin Unfractionated: 1.02 IU/mL — ABNORMAL HIGH (ref 0.30–0.70)
Heparin Unfractionated: 1.1 IU/mL — ABNORMAL HIGH (ref 0.30–0.70)
Heparin Unfractionated: 0.29 IU/mL — ABNORMAL LOW (ref 0.30–0.70)

## 2020-11-23 LAB — HEMOGLOBIN A1C
Hgb A1c MFr Bld: 5.7 % — ABNORMAL HIGH (ref 4.8–5.6)
Mean Plasma Glucose: 116.89 mg/dL

## 2020-11-23 LAB — PROTIME-INR
INR: 1.1 (ref 0.8–1.2)
Prothrombin Time: 14.2 seconds (ref 11.4–15.2)

## 2020-11-23 LAB — LACTATE DEHYDROGENASE: LDH: 198 U/L — ABNORMAL HIGH (ref 98–192)

## 2020-11-23 LAB — MAGNESIUM: Magnesium: 1.8 mg/dL (ref 1.7–2.4)

## 2020-11-23 LAB — ANTITHROMBIN III: AntiThromb III Func: 67 % — ABNORMAL LOW (ref 75–120)

## 2020-11-23 LAB — URIC ACID: Uric Acid, Serum: 6.3 mg/dL (ref 3.7–8.6)

## 2020-11-23 LAB — ABO/RH: ABO/RH(D): O POS

## 2020-11-23 SURGERY — RIGHT HEART CATH
Anesthesia: LOCAL

## 2020-11-23 MED ORDER — HEPARIN (PORCINE) IN NACL 1000-0.9 UT/500ML-% IV SOLN
INTRAVENOUS | Status: DC | PRN
  Administered 2020-11-23: 500 mL

## 2020-11-23 MED ORDER — MIDAZOLAM HCL 2 MG/2ML IJ SOLN
INTRAMUSCULAR | Status: AC
  Filled 2020-11-23: qty 2

## 2020-11-23 MED ORDER — LIDOCAINE HCL (PF) 1 % IJ SOLN
INTRAMUSCULAR | Status: DC | PRN
  Administered 2020-11-23: 5 mL

## 2020-11-23 MED ORDER — MIDAZOLAM HCL 2 MG/2ML IJ SOLN
INTRAMUSCULAR | Status: DC | PRN
  Administered 2020-11-23: 1 mg via INTRAVENOUS

## 2020-11-23 MED ORDER — LIDOCAINE HCL (PF) 1 % IJ SOLN
INTRAMUSCULAR | Status: AC
  Filled 2020-11-23: qty 30

## 2020-11-23 MED ORDER — HEPARIN (PORCINE) 25000 UT/250ML-% IV SOLN
1250.0000 [IU]/h | INTRAVENOUS | Status: DC
  Administered 2020-11-23: 1200 [IU]/h via INTRAVENOUS
  Administered 2020-11-24 – 2020-12-02 (×8): 1250 [IU]/h via INTRAVENOUS
  Filled 2020-11-23 (×15): qty 250

## 2020-11-23 MED ORDER — MAGNESIUM SULFATE 2 GM/50ML IV SOLN
2.0000 g | Freq: Once | INTRAVENOUS | Status: AC
  Administered 2020-11-23: 2 g via INTRAVENOUS
  Filled 2020-11-23: qty 50

## 2020-11-23 MED ORDER — FENTANYL CITRATE (PF) 100 MCG/2ML IJ SOLN
INTRAMUSCULAR | Status: DC | PRN
  Administered 2020-11-23: 25 ug via INTRAVENOUS

## 2020-11-23 MED ORDER — HEPARIN (PORCINE) IN NACL 1000-0.9 UT/500ML-% IV SOLN
INTRAVENOUS | Status: AC
  Filled 2020-11-23: qty 500

## 2020-11-23 MED ORDER — FENTANYL CITRATE (PF) 100 MCG/2ML IJ SOLN
INTRAMUSCULAR | Status: AC
  Filled 2020-11-23: qty 2

## 2020-11-23 MED FILL — Perflutren Lipid Microsphere IV Susp 1.1 MG/ML: INTRAVENOUS | Qty: 10 | Status: AC

## 2020-11-23 SURGICAL SUPPLY — 7 items
CATH SWAN GANZ 7F STRAIGHT (CATHETERS) ×1 IMPLANT
KIT HEART LEFT (KITS) ×1 IMPLANT
PACK CARDIAC CATHETERIZATION (CUSTOM PROCEDURE TRAY) ×2 IMPLANT
SHEATH PINNACLE 7F 10CM (SHEATH) ×1 IMPLANT
SHEATH PROBE COVER 6X72 (BAG) ×1 IMPLANT
SLEEVE REPOSITIONING LENGTH 30 (MISCELLANEOUS) ×1 IMPLANT
TRANSDUCER W/STOPCOCK (MISCELLANEOUS) ×2 IMPLANT

## 2020-11-23 NOTE — Progress Notes (Signed)
ANTICOAGULATION CONSULT NOTE  Pharmacy Consult for heparin Indication: LV thrombus 12/26   Allergies  Allergen Reactions  . Other Anaphylaxis    Insect bites (Has EpiPen)    Patient Measurements: Height: 6' (182.9 cm) Weight: 80.7 kg (177 lb 14.6 oz) IBW/kg (Calculated) : 77.6 Heparin Dosing Weight: 82kg  Vital Signs: Temp: 98 F (36.7 C) (12/27 0842) Temp Source: Oral (12/27 0842) BP: 124/77 (12/27 0930) Pulse Rate: 65 (12/27 0930)  Labs: Recent Labs    11/21/20 0242 11/22/20 0539 11/23/20 0612  HGB  --   --  9.5*  HCT  --   --  30.2*  PLT  --   --  232  HEPARINUNFRC  --   --  1.02*  CREATININE 1.96* 1.81* 1.53*    Estimated Creatinine Clearance: 47.2 mL/min (A) (by C-G formula based on SCr of 1.53 mg/dL (H)).   Medical History: Past Medical History:  Diagnosis Date  . Adult ADHD   . AICD (automatic cardioverter/defibrillator) present   . CAD (coronary artery disease)    Cath 2008, all grafts patent // Cath 2017 - all grafts patent   . 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 Carotid artery disease ([facility])    Doppler, February, 2012, mild plaque, 0-39% bilateral, plan followup in one year  . Dyspepsia   . Ejection fraction < 50%   . Elevated PSA 02/15/2011  . Esophageal stricture   . GERD (gastroesophageal reflux disease)   . HTN (hypertension)   . Hx of CABG    1991  . Hyperlipidemia   . 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 Malignant neoplasm of tonsil ([facility])   . Myocardial infarction ([facility]) 1998  . Prostate cancer ([facility]) 2012   IMRT, ADT  . Thrombosis of arm    Left  . Venous thrombosis    Left arm      Assessment: 74 yo M with large laminated mural apical thrombus found on TTE 11/22/20, originally admitted for HF exacerbation. Pharmacy consulted for IV heparin. No AC PTA.   Underwent  RHC today showing stable CO on milrinone, improved vol status, and mild pulm HTN- plan to continue work-up for LVAD. Will restart heparin 4 hours post-cath. Heparin level this morning was elevated at 1.02, on 1300 units/hr prior to heparin stop for cath. Hgb 9.5, plt 232. No s/sx of bleeding.  Goal of Therapy:  Heparin level 0.3-0.7 units/ml Monitor platelets by anticoagulation protocol: Yes   Plan:  Reduce heparin infusion to 1200 units/hr  Order heparin level in 8 hr  Monitor daily HL, CBC/plt Monitor for signs/symptoms of bleeding  F/u long term anticoagulation plan  Sherron Monday, PharmD, BCCCP Clinical Pharmacist  Phone: [phone] 11/23/2020 10:55 AM  Please check AMION for all [facility] Pharm[facility] phone numbers After 10:00 PM, call Main Pharmacy [phone]

## 2020-11-23 NOTE — Progress Notes (Signed)
Wife called [facility] to report she and her husband completed reading VAD related consents and both have signed and are in agreement to begin evaluation process.  Zada Girt RN, [place] Coordinator [phone]

## 2020-11-23 NOTE — Progress Notes (Deleted)
Cardiology Office Note:    Date:  11/23/2020   ID:  [name], DOB 1945-12-08, MRN [id number]  PCP:  Myrlene Broker, MD  [facility] Cardiologist:  Kristeen Miss, MD *** [facility] Electrophysiologist:  Lewayne Bunting, MD   Referring MD: Myrlene Broker, *   Chief Complaint:  No chief complaint on file.    Patient Profile:    [name] is a 74 y.o. male with:   Coronary artery disease   S/p CABG  Cath in 2017: all grafts patent  Heart failure with reduced ejection fraction   Ischemic cardiomyopathy  VTach   S/p ICD   Hypertension   Hyperlipidemia   Carotid artery disease   GERD  Prostate CA   Prior CV studies: Cardiac catheterization Dec 29, 2015 LAD ostial 100 LCx proximal 100 RCA ostial 100 LIMA-LAD patent Sequential SVG-OM2/OM3 patent SVG-ostial RPDA patent  Echocardiogram 08/18/2015 EF 20-25, diffuse HK, apical akinesis, grade 1 diastolic dysfunction, mild AI, severe LAE, mild to moderate RAE  Carotid US 03/19/2013 Bilateral ICA 0-39  History of Present Illness:    [name] ***      Past Medical History:  Diagnosis Date  . Adult ADHD   . AICD (automatic cardioverter/defibrillator) present   . CAD (coronary artery disease)    Cath 2008, all grafts patent // Cath 2017 - all grafts patent   . 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 Carotid artery disease (HCC)    Doppler, February, 2012, mild plaque, 0-39% bilateral, plan followup in one year  . Dyspepsia   . Ejection fraction < 50%   . Elevated PSA 02/15/2011  . Esophageal stricture   . GERD (gastroesophageal reflux disease)   . HTN (hypertension)   . Hx of CABG    1991  . Hyperlipidemia   . 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 Malignant neoplasm of tonsil (HCC)   . Myocardial  infarction (HCC) 1998  . Prostate cancer (HCC) 2012   IMRT, ADT  . Thrombosis of arm    Left  . Venous thrombosis    Left arm    Current Medications: No outpatient medications have been marked as taking for the 11/24/20 encounter (Appointment) with Tereso Newcomer T, PA-C.     Allergies:   Other   Social History   Tobacco Use  . Smoking status: Never Smoker  . Smokeless tobacco: Never Used  Vaping Use  . Vaping Use: Never used  Substance Use Topics  . Alcohol use: No  . Drug use: No     Family Hx: The patient's family history includes Alzheimer's disease in his mother; Breast cancer in his sister; CAD in his father; Congestive Heart Failure in his father; Dementia in his mother; Heart disease in his brother; Hyperlipidemia in his father; Hypertension in his father. There is no history of Colon cancer, Prostate cancer, or Diabetes.  ROS   EKGs/Labs/Other Test Reviewed:    EKG:  EKG is *** ordered today.  The ekg ordered today demonstrates ***  Recent Labs: 03/09/2020: ALT 9 10/23/2020: TSH 1.858 11/17/2020: B Natriuretic Peptide 3,663.4 11/23/2020: BUN 17; Creatinine, Ser 1.53; Hemoglobin 10.2; Hemoglobin 10.9; Magnesium 1.8; Platelets 232; Potassium 3.9; Potassium 4.1; Sodium 144; Sodium 143   Recent Lipid Panel Lab Results  Component Value Date/Time   CHOL 134 10/24/2020 04:00  AM   CHOL 152 03/09/2020 10:19 AM   TRIG 68 10/24/2020 04:00 AM   HDL 39 (L) 10/24/2020 04:00 AM   HDL 61 03/09/2020 10:19 AM   CHOLHDL 3.4 10/24/2020 04:00 AM   LDLCALC 81 10/24/2020 04:00 AM   LDLCALC 75 03/09/2020 10:19 AM      Risk Assessment/Calculations:   {Does this patient have ATRIAL FIBRILLATION?:[id number]}  Physical Exam:    VS:  There were no vitals taken for this visit.    Wt Readings from Last 3 Encounters:  11/23/20 177 lb 14.6 oz (80.7 kg)  10/25/20 174 lb 9.6 oz (79.2 kg)  10/20/20 183 lb (83 kg)     Physical Exam ***  ASSESSMENT & PLAN:    *** Coronary  artery disease involving native coronary artery of native heart without angina pectoris History of CABG.  Cardiac catheterization in 2017 with patent bypass grafts.  He is not having any symptoms of angina.  Continue aspirin, statin, beta-blocker.  Chronic systolic CHF (congestive heart failure) (HCC) EF 20-25 by most recent echocardiogram.  NYHA 2.  He does not require diuretic therapy.  Continue beta-blocker, ARB.  Essential hypertension The patient's blood pressure is controlled on his current regimen.  Continue current therapy.   Pure hypercholesterolemia LDL optimal on most recent lab work.  Continue current Rx.    Ventricular tachycardia ([place]) In review of his chart, it does not appear that he has had recurrent ventricular tachycardia.  Continue follow-up with Dr. Lovena Le as planned.  ICD (implantable cardioverter-defibrillator) in place Continue follow-up with EP as planned. {Are you ordering a CV Procedure (e.g. stress test, cath, DCCV, TEE, etc)?   Press F2        :[id number]  Dispo:  No follow-ups on file.   Medication Adjustments/Labs and Tests Ordered: Current medicines are reviewed at length with the patient today.  Concerns regarding medicines are outlined above.  Tests Ordered: No orders of the defined types were placed in this encounter.  Medication Changes: No orders of the defined types were placed in this encounter.   Signed, Richardson Dopp, PA-C  11/23/2020 10:00 PM    [facility] [address], [place], [place]  [address] Phone: [phone]; Fax: [phone]

## 2020-11-23 NOTE — Progress Notes (Signed)
PROGRESS NOTE    [name]  [id number] DOB: 02-13-1946 DOA: 11/17/2020 PCP: Hoyt Koch, MD   Brief Narrative:  [name] is a 74 y.o. male with medical history significant of CAD status post CABG and PCI, chronic combined systolic and diastolic CHF/ischemic cardiomyopathy with LVEF 20-25% and history of VT status post ICD, hypertension, hyperlipidemia, CKD stage IIIa, history of prostate and tonsillar cancer, history of DVT presenting to the ED with complaints of shortness of breath.  Patient reports dyspnea on exertion and cough for the past 3 to 4 days.  He was having low-grade fevers earlier but has not had them for the past few days.  He has also had 3 episodes of vomiting over the past week which has now resolved.  Denies abdominal pain or diarrhea.  States his ICD had shocked him prior to his recent hospitalization but he has not had any issues lately.  He is vaccinated against Covid.  States he has been checked for Covid multiple times recently and every time results came back negative.  ED Course: Afebrile.  Tachypneic with respiratory rate in the 20s.  Not tachycardic.  Not hypoxic.  WBC 7.0, hemoglobin 10.3, hematocrit 33.9, platelet 186K.  Sodium 140, potassium 3.7, chloride 107, bicarb 22, BUN 27, creatinine 1.7, glucose 106.  High-sensitivity troponin mildly elevated but stable (29 >29).  BNP significantly elevated at 3663.  SARS-CoV-2 PCR test and influenza panel both negative.  Chest x-ray showing cardiomegaly and small effusions in the posterior costophrenic angles; no frank edema.Patient was given IV Lasix 20 mg.  Assessment & Plan:  Acute on chronic combined systolic and diastolic CHF: -Patient presented with signs of fluid overload.  Chest x-ray shows cardiomegaly and small effusion in the posterior costophrenic angles.  -On admission: Has bibasilar Rales, JVD and significantly elevated BNP of 3663. -Echo from 10/24/2020 showing severely  reduced ejection fraction of 20 to [id number], grade 2 diastolic dysfunction, moderate to severe MR. -Status post RHC on 11/23/2020-results reviewed. -Lasix, Entresto and Coreg was discontinued  -Continue milrinone -Strict INO's and daily weight.  Monitor signs of fluid overload.  Monitor electrolytes closely. -Keep potassium more than 4, magnesium more than 2.    Monomorphic ventricular tachycardia: -Was given loading dose of amiodarone and then was placed on GGT -Continue on amiodarone 400 mg twice daily and mexiletine.  -Appreciate cardiology and EP team assistance -Recent left heart cath on 10/23/2020 without any targets for revascularization.  LV thrombus: Noted on echo on 12/26. -Shows 20% ejection fraction with LV thrombus.  Severe MR.  Moderate RV dysfunction. -Continue IV heparin as per pharmacy.  AKI on CKD stage IIIa: -Kidney function improving -Entresto and Lasix was discontinued by cardiology. -Continue to monitor kidney function closely. -Repeat BMP tomorrow a.m.  Severe MR: -Status post RHC on 12/27. -Appreciate cardiology assistance.  History of coronary artery disease status post CABG and PCI:  -Patient denies ACS symptoms.  Troponin mildly elevated but stable 29x2.  Suspected demand ischemia from decompensated CHF. -Continue aspirin, and statin  Hypokalemia: Replenished.  Magnesium level: WNL.  Repeat BMP tomorrow a.m.  Hypertension: Stable.  Continue Coreg and Entresto. Monitor blood pressure closely.  Hyperlipidemia: Continue Lipitor  GERD: Continue PPI  Hypokalemia: Resolved.  Continue potassium supplement to keep potassium above 4.  Hypomagnesemia: Replenished.  Repeat magnesium level tomorrow AM.  Depression/anxiety: Continue Lexapro, Wellbutrin and Ambien  DVT prophylaxis: Heparin Code Status: None Family Communication: None present at bedside.  Plan of care discussed with  patient in length and he verbalized understanding and agreed with  it. Disposition Plan: Likely home in 1 to 2 days  Consultants:   Cardiology  Procedures:  Echo Right heart cath  Antimicrobials:   None  Status is: Inpatient   Dispo: The patient is from: Home              Anticipated d/c is to: Home              Anticipated d/c date is: 2 days              Patient currently is not medically stable to d/c.   Subjective: Patient seen and examined.  Underwent right heart cath this morning.  Tells me that his breathing has improved.  Denies chest pain, leg swelling, orthopnea or PND.  No acute events overnight.  He tolerated procedure very well.   Objective: Vitals:   11/23/20 0842 11/23/20 0900 11/23/20 0915 11/23/20 0930  BP: 130/81 122/71 123/71 124/77  Pulse: 66 71 68 65  Resp: 16     Temp: 98 F (36.7 C)     TempSrc: Oral     SpO2: 95% 92% 94% 92%  Weight:      Height:        Intake/Output Summary (Last 24 hours) at 11/23/2020 1020 Last data filed at 11/23/2020 0400 Gross per 24 hour  Intake 350.73 ml  Output 375 ml  Net -24.27 ml   Filed Weights   11/21/20 0110 11/22/20 0000 11/23/20 0218  Weight: 81.1 kg 82 kg 80.7 kg    Examination:  General exam: Appears calm and comfortable, on room air, elderly,  communicating well Respiratory system: Clear to auscultation. Respiratory effort normal. Cardiovascular system: S1 & S2 heard, RRR. No JVD, murmurs, rubs, gallops or clicks. No pedal edema. Gastrointestinal system: Abdomen is nondistended, soft and nontender. No organomegaly or masses felt. Normal bowel sounds heard. Central nervous system: Alert and oriented. No focal neurological deficits. Extremities: Symmetric 5 x 5 power. Skin: No rashes, lesions or ulcers Psychiatry: Judgement and insight appear normal. Mood & affect appropriate.    Data Reviewed: I have personally reviewed following labs and imaging studies  CBC: Recent Labs  Lab 11/17/20 1644 11/19/20 0301 11/23/20 0612  WBC 7.0 7.2 5.8  HGB 10.3*  10.6* 9.5*  HCT 33.9* 33.0* 30.2*  MCV 88.5 85.5 84.1  PLT 186 189 [id number]   Basic Metabolic Panel: Recent Labs  Lab 11/19/20 0301 11/19/20 1052 11/20/20 0123 11/20/20 0801 11/21/20 0242 11/22/20 0539 11/23/20 0612  NA 139 135  --  137 138 138 139  K 4.7 4.0  --  4.2 4.0 3.2* 4.0  CL 105 104  --  104 103 102 106  CO2 23 19*  --  22 23 25 23   GLUCOSE 118* 126*  --  129* 98 85 93  BUN 26* 28*  --  27* 27* 23 17  CREATININE 1.72* 1.74*  --  1.82* 1.96* 1.81* 1.53*  CALCIUM 8.5* 8.2*  --  8.6* 8.4* 8.3* 8.2*  MG 2.0  --  2.0  --  1.9 2.0 1.8   GFR: Estimated Creatinine Clearance: 47.2 mL/min (A) (by C-G formula based on SCr of 1.53 mg/dL (H)). Liver Function Tests: No results for input(s): AST, ALT, ALKPHOS, BILITOT, PROT, ALBUMIN in the last 168 hours. No results for input(s): LIPASE, AMYLASE in the last 168 hours. No results for input(s): AMMONIA in the last 168 hours. Coagulation Profile: No results for input(s):  INR, PROTIME in the last 168 hours. Cardiac Enzymes: No results for input(s): CKTOTAL, CKMB, CKMBINDEX, TROPONINI in the last 168 hours. BNP (last 3 results) No results for input(s): PROBNP in the last 8760 hours. HbA1C: No results for input(s): HGBA1C in the last 72 hours. CBG: No results for input(s): GLUCAP in the last 168 hours. Lipid Profile: No results for input(s): CHOL, HDL, LDLCALC, TRIG, CHOLHDL, LDLDIRECT in the last 72 hours. Thyroid Function Tests: No results for input(s): TSH, T4TOTAL, FREET4, T3FREE, THYROIDAB in the last 72 hours. Anemia Panel: No results for input(s): VITAMINB12, FOLATE, FERRITIN, TIBC, IRON, RETICCTPCT in the last 72 hours. Sepsis Labs: Recent Labs  Lab 11/18/20 1254 11/19/20 0301 11/20/20 0123  PROCALCITON 0.10 <0.10 0.13    Recent Results (from the past 240 hour(s))  Resp Panel by RT-PCR (Flu A&B, Covid) Nasopharyngeal Swab     Status: None   Collection Time: 11/17/20 10:35 PM   Specimen: Nasopharyngeal Swab;  Nasopharyngeal(NP) swabs in vial transport medium  Result Value Ref Range Status   SARS Coronavirus 2 by RT PCR NEGATIVE NEGATIVE Final    Comment: (NOTE) SARS-CoV-2 target nucleic acids are NOT DETECTED.  The SARS-CoV-2 RNA is generally detectable in upper respiratory specimens during the acute phase of infection. The lowest concentration of SARS-CoV-2 viral copies this assay can detect is 138 copies/mL. A negative result does not preclude SARS-Cov-2 infection and should not be used as the sole basis for treatment or other patient management decisions. A negative result may occur with  improper specimen collection/handling, submission of specimen other than nasopharyngeal swab, presence of viral mutation(s) within the areas targeted by this assay, and inadequate number of viral copies(<138 copies/mL). A negative result must be combined with clinical observations, patient history, and epidemiological information. The expected result is Negative.  Fact Sheet for Patients:  EntrepreneurPulse.com.au  Fact Sheet for Healthcare Providers:  IncredibleEmployment.be  This test is no t yet approved or cleared by the [place] FDA and  has been authorized for detection and/or diagnosis of SARS-CoV-2 by FDA under an Emergency Use Authorization (EUA). This EUA will remain  in effect (meaning this test can be used) for the duration of the COVID-19 declaration under Section 564(b)(1) of the Act, 21 U.S.C.section 360bbb-3(b)(1), unless the authorization is terminated  or revoked sooner.       Influenza A by PCR NEGATIVE NEGATIVE Final   Influenza B by PCR NEGATIVE NEGATIVE Final    Comment: (NOTE) The Xpert Xpress SARS-CoV-2/FLU/RSV plus assay is intended as an aid in the diagnosis of influenza from Nasopharyngeal swab specimens and should not be used as a sole basis for treatment. Nasal washings and aspirates are unacceptable for Xpert Xpress  SARS-CoV-2/FLU/RSV testing.  Fact Sheet for Patients: EntrepreneurPulse.com.au  Fact Sheet for Healthcare Providers: IncredibleEmployment.be  This test is not yet approved or cleared by the [place] FDA and has been authorized for detection and/or diagnosis of SARS-CoV-2 by FDA under an Emergency Use Authorization (EUA). This EUA will remain in effect (meaning this test can be used) for the duration of the COVID-19 declaration under Section 564(b)(1) of the Act, 21 U.S.C. section 360bbb-3(b)(1), unless the authorization is terminated or revoked.  Performed at [facility], [place] [address], [place], [place] [address]       Radiology Studies: CARDIAC CATHETERIZATION  Result Date: 11/23/2020 Findings: On milrinone 0.375 RA = 6 RV = 56/10 PA = 51/22 (33) PCW = 19 Thermal cardiac output/index = 5.5/2.7 Fick CO/CI =  6.2/3.0 PVR = 2.3 WU Ao sat = 94% PA sat = 59%, 61% PAPi = 4.8 Assessment: 1. Mild pulmonary venous HTN 2. Volume status improved 3. Cardiac output ok on milrinone 4. PAPI acceptable Plan/Discussion: Proceed with LVAD work-up. Glori Bickers, MD 8:14 AM  ECHOCARDIOGRAM LIMITED  Result Date: 11/22/2020    ECHOCARDIOGRAM LIMITED REPORT   Patient Name:   [name] Date of Exam: 11/22/2020 Medical Rec #:  [id number]          Height:       72.0 in Accession #:    [id number]         Weight:       180.8 lb Date of Birth:  December 06, 1945         BSA:          2.041 m Patient Age:    16 years           BP:           103/67 mmHg Patient Gender: M                  HR:           59 bpm. Exam Location:  Inpatient Procedure: Limited Echo, Cardiac Doppler, Color Doppler and Intracardiac            Opacification Agent REPORT CONTAINS CRITICAL RESULT Indications:    CHF-Acute Systolic  History:        Patient has prior history of Echocardiogram examinations, most                 recent 10/24/2020. CAD and Previous Myocardial Infarction, Prior                  CABG and Defibrillator; Risk Factors:Dyslipidemia. ETOH abuse.  Sonographer:    Clayton Lefort RDCS (AE) Referring Phys: [id number] [place]  1. Large laminated mural apical thrombus. Left ventricular ejection fraction, by estimation, is <20%. The left ventricle has severely decreased function. The left ventricle demonstrates severe global hypokinesis with akinesis of the inferior, inferolateral, inferoseptal and apical walls. The left ventricular internal cavity size was severely dilated.  2. Left atrial size was severely dilated.  3. The posterior MV leaflet is tethered and restricted. . The mitral valve is abnormal. Severe mitral valve regurgitation.  4. The aortic valve is tricuspid. There is mild calcification of the aortic valve. Aortic valve regurgitation is mild.  5. Right ventricular systolic function is moderately reduced. FINDINGS  Left Ventricle: Large laminated mural apical thrombus. Left ventricular ejection fraction, by estimation, is <20%. The left ventricle has severely decreased function. The left ventricle demonstrates global hypokinesis. Definity contrast agent was given IV to delineate the left ventricular endocardial borders. The left ventricular internal cavity size was severely dilated. There is no left ventricular hypertrophy. Right Ventricle: Right ventricular systolic function is moderately reduced. Left Atrium: Left atrial size was severely dilated. Pericardium: There is no evidence of pericardial effusion. Mitral Valve: The posterior MV leaflet is tethered and restricted. The mitral valve is abnormal. There is moderate thickening of the mitral valve leaflet(s). Severe mitral valve regurgitation, with posteriorly-directed jet. Tricuspid Valve: The tricuspid valve is grossly normal. Aortic Valve: The aortic valve is tricuspid. There is mild calcification of the aortic valve. Aortic valve regurgitation is mild. Pulmonic Valve: The pulmonic valve was not  assessed. Additional Comments: A pacer wire is visualized. LEFT VENTRICLE PLAX 2D LVIDd:         6.10  cm LVIDs:         5.40 cm LV PW:         1.10 cm LV IVS:        0.90 cm  LEFT ATRIUM         Index LA diam:    4.60 cm 2.25 cm/m  MR Peak grad:    121.9 mmHg MR Mean grad:    68.0 mmHg MR Vmax:         552.00 cm/s MR Vmean:        378.0 cm/s MR PISA:         3.08 cm MR PISA Eff ROA: 21 mm MR PISA Radius:  0.70 cm Arvilla Meres MD Electronically signed by Arvilla Meres MD Signature Date/Time: 11/22/2020/8:31:45 PM    Final     Scheduled Meds: . amiodarone  400 mg Oral BID  . aspirin EC  81 mg Oral Daily  . atorvastatin  80 mg Oral QPM  . buPROPion  300 mg Oral Daily  . Chlorhexidine Gluconate Cloth  6 each Topical Daily  . escitalopram  20 mg Oral Daily  . mexiletine  150 mg Oral Q8H  . multivitamin  1 tablet Oral Daily  . pantoprazole  40 mg Oral QAC breakfast  . potassium chloride  20 mEq Oral Daily  . sodium chloride flush  10-40 mL Intracatheter Q12H  . sodium chloride flush  3 mL Intravenous Q12H  . zolpidem  5 mg Oral QHS   Continuous Infusions: . heparin Stopped (11/23/20 0640)  . magnesium sulfate bolus IVPB    . milrinone 0.25 mcg/kg/min (11/22/20 2143)     LOS: 6 days   Time spent: 40 minutes  Gicela Schwarting Estill Cotta, MD [facility]  If 7PM-7AM, please contact night-coverage [email or web address] 11/23/2020, 10:20 AM

## 2020-11-23 NOTE — Progress Notes (Signed)
   11/23/20 0958  Clinical Encounter Type  Visited With Patient  Visit Type Initial  Referral From Nurse  Consult/Referral To Chaplain  Spiritual Encounters  Spiritual Needs Prayer  Chaplain responded to the consult for [name]. He requested prayer. I noticed on his chart under [place] he is Catholic and asked if he would prefer Father Barnabas Lister to come and have prayer with him and he said yes.  I informed Mr. Cuaresma I will call Father Barnabas Lister and follow up with him on when Father can visit.  Phone call was made and Father Barnabas Lister said he would come and visit Mr Lagrange today.  Chaplain Tauri Ethington Morgan-Simpson [phone]

## 2020-11-23 NOTE — Progress Notes (Signed)
ANTICOAGULATION CONSULT NOTE  Pharmacy Consult for heparin Indication: LV thrombus 12/26   Allergies  Allergen Reactions  . Other Anaphylaxis    Insect bites (Has EpiPen)    Patient Measurements: Height: 6' (182.9 cm) Weight: 80.7 kg (177 lb 14.6 oz) IBW/kg (Calculated) : 77.6 Heparin Dosing Weight: 82kg  Vital Signs: Temp: 98.8 F (37.1 C) (12/27 1940) Temp Source: Oral (12/27 1940) BP: 115/72 (12/27 1940) Pulse Rate: 71 (12/27 1940)  Labs: Recent Labs    11/21/20 0242 11/22/20 0539 11/23/20 0612 11/23/20 0754 11/23/20 1622 11/23/20 1930  HGB  --   --  9.5* 10.9*  10.2*  --   --   HCT  --   --  30.2* 32.0*  30.0*  --   --   PLT  --   --  232  --   --   --   LABPROT  --   --   --   --  14.2  --   INR  --   --   --   --  1.1  --   HEPARINUNFRC  --   --  1.02*  --   --  1.10*  CREATININE 1.96* 1.81* 1.53*  --   --   --     Estimated Creatinine Clearance: 47.2 mL/min (A) (by C-G formula based on SCr of 1.53 mg/dL (H)).   Medical History: Past Medical History:  Diagnosis Date  . Adult ADHD   . AICD (automatic cardioverter/defibrillator) present   . CAD (coronary artery disease)    Cath 2008, all grafts patent // Cath 2017 - all grafts patent   . 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 Carotid artery disease (HCC)    Doppler, February, 2012, mild plaque, 0-39% bilateral, plan followup in one year  . Dyspepsia   . Ejection fraction < 50%   . Elevated PSA 02/15/2011  . Esophageal stricture   . GERD (gastroesophageal reflux disease)   . HTN (hypertension)   . Hx of CABG    1991  . Hyperlipidemia   . 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 Malignant neoplasm of tonsil (HCC)   . Myocardial infarction (HCC) 1998  . Prostate cancer (HCC) 2012   IMRT, ADT  . Thrombosis of arm    Left  . Venous  thrombosis    Left arm      Assessment: 74 yo M with large laminated mural apical thrombus found on TTE 11/22/20, originally admitted for HF exacerbation. Pharmacy consulted for IV heparin. No AC PTA.   HL uptrended to 1.1 despite rate decrease. Per RN, level was drawn from PICC lumen and heparin is running in the other lumen. RN thinks heparin was held before drawing but is not sure. Given possible contamination in sample, will repeat level holding heparin for 5 minutes before drawing. Repeat level back at 0.29.   Goal of Therapy:  Heparin level 0.3-0.7 units/ml Monitor platelets by anticoagulation protocol: Yes   Plan:  Increase heparin infusion to 1250 units/hr Monitor daily HL, CBC/plt Monitor for signs/symptoms of bleeding  F/u long term anticoagulation plan   Alphia Moh, PharmD, BCPS, BCCP Clinical Pharmacist  Please check AMION for all [facility] phone numbers After 10:00 PM, call Main Pharmacy [phone]

## 2020-11-23 NOTE — Plan of Care (Signed)
  Problem: Nutrition: Goal: Adequate nutrition will be maintained Outcome: Completed/Met   Problem: Pain Managment: Goal: General experience of comfort will improve Outcome: Completed/Met   Problem: Skin Integrity: Goal: Risk for impaired skin integrity will decrease Outcome: Completed/Met   

## 2020-11-23 NOTE — Progress Notes (Signed)
VS pre sheath pull: BP 136/72, HR 68, RR 20, O2 sat 96% on room air Sheath location: right IJ Arterial/Venous: venous Manual pressure hold time: 5 minutes Bedrest begins: 0820 Pulses: n/a VS post sheath pull: BP 136/81, HR 69, RR 17, O2 sat: 93% on room air

## 2020-11-23 NOTE — H&P (View-Only) (Signed)
Advanced Heart Failure Rounding Note   Subjective:    Milrinone started 12/25 for co-ox 44%.   Feels ok. Denies CP or SOB.   CVP 6. Co-ox  61%. BMET pending   ECHO: 12/26 EF 20% with LV thrombus. Severe MR. Moderate RV dysfunction. Now on heparin. No bleeding   Objective:   Weight Range:  Vital Signs:   Temp:  [98 F (36.7 C)-99.2 F (37.3 C)] 98.2 F (36.8 C) (12/27 0625) Pulse Rate:  [59-73] 71 (12/27 0625) Resp:  [20-22] 21 (12/27 0625) BP: (103-127)/(63-79) 127/79 (12/27 0041) SpO2:  [95 %-96 %] 95 % (12/27 0625) Weight:  [80.7 kg] 80.7 kg (12/27 0218) Last BM Date: 11/22/20  Weight change: Filed Weights   11/21/20 0110 11/22/20 0000 11/23/20 0218  Weight: 81.1 kg 82 kg 80.7 kg    Intake/Output:   Intake/Output Summary (Last 24 hours) at 11/23/2020 0650 Last data filed at 11/23/2020 0400 Gross per 24 hour  Intake 599.37 ml  Output 375 ml  Net 224.37 ml     Physical Exam: General:  Sitting up in bed.  No resp difficulty HEENT: normal Neck: supple. no JVD. Carotids 2+ bilat; no bruits. No lymphadenopathy or thryomegaly appreciated. Cor: PMI nondisplaced. Regular rate & rhythm. 3/6 MR Lungs: clear Abdomen: soft, nontender, nondistended. No hepatosplenomegaly. No bruits or masses. Good bowel sounds. Extremities: no cyanosis, clubbing, rash, edema Neuro: alert & orientedx3, cranial nerves grossly intact. moves all 4 extremities w/o difficulty. Affect pleasant  Telemetry:  Sinus 60-70s Personally reviewed  Labs: Basic Metabolic Panel: Recent Labs  Lab 11/18/20 0257 11/19/20 0301 11/19/20 1052 11/20/20 0123 11/20/20 0801 11/21/20 0242 11/22/20 0539  NA 140 139 135  --  137 138 138  K 3.1* 4.7 4.0  --  4.2 4.0 3.2*  CL 106 105 104  --  104 103 102  CO2 19* 23 19*  --  22 23 25   GLUCOSE 94 118* 126*  --  129* 98 85  BUN 26* 26* 28*  --  27* 27* 23  CREATININE 1.56* 1.72* 1.74*  --  1.82* 1.96* 1.81*  CALCIUM 8.3* 8.5* 8.2*  --  8.6* 8.4*  8.3*  MG 1.7 2.0  --  2.0  --  1.9 2.0    Liver Function Tests: No results for input(s): AST, ALT, ALKPHOS, BILITOT, PROT, ALBUMIN in the last 168 hours. No results for input(s): LIPASE, AMYLASE in the last 168 hours. No results for input(s): AMMONIA in the last 168 hours.  CBC: Recent Labs  Lab 11/17/20 1644 11/19/20 0301  WBC 7.0 7.2  HGB 10.3* 10.6*  HCT 33.9* 33.0*  MCV 88.5 85.5  PLT 186 189    Cardiac Enzymes: No results for input(s): CKTOTAL, CKMB, CKMBINDEX, TROPONINI in the last 168 hours.  BNP: BNP (last 3 results) Recent Labs    11/17/20 1645  BNP 3,663.4*    ProBNP (last 3 results) No results for input(s): PROBNP in the last 8760 hours.    Other results:  Imaging: ECHOCARDIOGRAM LIMITED  Result Date: 11/22/2020    ECHOCARDIOGRAM LIMITED REPORT   Patient Name:   [name] Date of Exam: 11/22/2020 Medical Rec #:  [id number]          Height:       72.0 in Accession #:    [id number]         Weight:       180.8 lb Date of Birth:  04/23/1946  BSA:          2.041 m Patient Age:    74 years           BP:           103/67 mmHg Patient Gender: M                  HR:           59 bpm. Exam Location:  Inpatient Procedure: Limited Echo, Cardiac Doppler, Color Doppler and Intracardiac            Opacification Agent REPORT CONTAINS CRITICAL RESULT Indications:    CHF-Acute Systolic  History:        Patient has prior history of Echocardiogram examinations, most                 recent 10/24/2020. CAD and Previous Myocardial Infarction, Prior                 CABG and Defibrillator; Risk Factors:Dyslipidemia. ETOH abuse.  Sonographer:    Clayton Lefort RDCS (AE) Referring Phys: [id number] [place]  1. Large laminated mural apical thrombus. Left ventricular ejection fraction, by estimation, is <20%. The left ventricle has severely decreased function. The left ventricle demonstrates severe global hypokinesis with akinesis of the inferior,  inferolateral, inferoseptal and apical walls. The left ventricular internal cavity size was severely dilated.  2. Left atrial size was severely dilated.  3. The posterior MV leaflet is tethered and restricted. . The mitral valve is abnormal. Severe mitral valve regurgitation.  4. The aortic valve is tricuspid. There is mild calcification of the aortic valve. Aortic valve regurgitation is mild.  5. Right ventricular systolic function is moderately reduced. FINDINGS  Left Ventricle: Large laminated mural apical thrombus. Left ventricular ejection fraction, by estimation, is <20%. The left ventricle has severely decreased function. The left ventricle demonstrates global hypokinesis. Definity contrast agent was given IV to delineate the left ventricular endocardial borders. The left ventricular internal cavity size was severely dilated. There is no left ventricular hypertrophy. Right Ventricle: Right ventricular systolic function is moderately reduced. Left Atrium: Left atrial size was severely dilated. Pericardium: There is no evidence of pericardial effusion. Mitral Valve: The posterior MV leaflet is tethered and restricted. The mitral valve is abnormal. There is moderate thickening of the mitral valve leaflet(s). Severe mitral valve regurgitation, with posteriorly-directed jet. Tricuspid Valve: The tricuspid valve is grossly normal. Aortic Valve: The aortic valve is tricuspid. There is mild calcification of the aortic valve. Aortic valve regurgitation is mild. Pulmonic Valve: The pulmonic valve was not assessed. Additional Comments: A pacer wire is visualized. LEFT VENTRICLE PLAX 2D LVIDd:         6.10 cm LVIDs:         5.40 cm LV PW:         1.10 cm LV IVS:        0.90 cm  LEFT ATRIUM         Index LA diam:    4.60 cm 2.25 cm/m  MR Peak grad:    121.9 mmHg MR Mean grad:    68.0 mmHg MR Vmax:         552.00 cm/s MR Vmean:        378.0 cm/s MR PISA:         3.08 cm MR PISA Eff ROA: 21 mm MR PISA Radius:  0.70 cm  Glori Bickers MD Electronically signed by Glori Bickers MD Signature Date/Time:  11/22/2020/8:31:45 PM    Final    [place] EKG SITE RITE  Result Date: 11/21/2020 If Site Rite image not attached, placement could not be confirmed due to current cardiac rhythm.    Medications:     Scheduled Medications: . amiodarone  400 mg Oral BID  . aspirin EC  81 mg Oral Daily  . atorvastatin  80 mg Oral QPM  . buPROPion  300 mg Oral Daily  . Chlorhexidine Gluconate Cloth  6 each Topical Daily  . escitalopram  20 mg Oral Daily  . mexiletine  150 mg Oral Q8H  . multivitamin  1 tablet Oral Daily  . pantoprazole  40 mg Oral QAC breakfast  . potassium chloride  20 mEq Oral Daily  . sodium chloride flush  10-40 mL Intracatheter Q12H  . sodium chloride flush  3 mL Intravenous Q12H  . zolpidem  5 mg Oral QHS    Infusions: . sodium chloride    . sodium chloride 10 mL/hr at 11/23/20 0049  . sodium chloride    . heparin 1,300 Units/hr (11/22/20 2134)  . milrinone 0.25 mcg/kg/min (11/22/20 2143)    PRN Medications: sodium chloride, acetaminophen **OR** acetaminophen, guaiFENesin-dextromethorphan, loperamide, ondansetron (ZOFRAN) IV, sodium chloride flush, sodium chloride flush   Assessment/Plan:   1.Acute on chronic systolic heart failure due to iCM, EF 20-25% -> cardiogenic shock - Echo 11/21 EF 20-25% mild RV dysfunction moderate to severe MR - EF 20% with LV thrombus. Severe MR. Moderate RV dysfunction. Now on heparin. No bleeding - NYHA IV - Low output physiology confirmed Co-ox (12/25) 44%. - Milrinone 0.25 started on 12/25. Co-ox 61% today - Off b-blocker with shock - Off Entresto with worsening renal function.  - Plan RHC todayvia IJ approach - Will begin VAD w/u. Seems to be good candidate. RV has moderate dysfunction but CVP low. Will see what RHC chows. I d/w him and his wife and they are interested. VAD packet provided. VAD coordinators to see today  2.Monomorphic  ventricular tachycardia -No further recurrence. ICD VT zones have been adjusted. Continue amiodarone.  - Keep K> 4.0 Mg > 2.0 -Recent left heart catheterization on 10/23/2020 without any targets for revascularization - Watch for increased ectopy on milrinone. Stable so far  3.CAD status post CABG -Recent left heart cath with severe three-vessel CAD. -He has patent vein grafts. (LIMA -> LAD, SVG-> RCA, SVG -> OM2 -> OM3) -No s/s angina -Continue aspirin and Lipitor  4.Severe mitral regurgitation - pending VAD  5. AKI  - due to cardiorenal/ATN - baseline 1.1 - 1.3-> 1.7 -> 1.8 -> 2.0 -> 1.8 -> pending - improving with hemodynamic support  6. Hypokalemia - BMET pending  7. Apical LV mural thrombus - heparin started.   Length of Stay: 6   Glori Bickers MD 11/23/2020, 6:50 AM  Advanced Heart Failure Team Pager [phone] (M-F; [place])  Please contact [facility] for night-coverage after hours (4p -7a ) and weekends on [email or web address]

## 2020-11-23 NOTE — Interval H&P Note (Signed)
History and Physical Interval Note:  11/23/2020 7:26 AM  [name]  has presented today for surgery, with the diagnosis of HF.  The various methods of treatment have been discussed with the patient and family. After consideration of risks, benefits and other options for treatment, the patient has consented to  Procedure(s): RIGHT HEART CATH (N/A) as a surgical intervention.  The patient's history has been reviewed, patient examined, no change in status, stable for surgery.  I have reviewed the patient's chart and labs.  Questions were answered to the patient's satisfaction.     [name]

## 2020-11-23 NOTE — Care Management Important Message (Signed)
Important Message  Patient Details  Name: [name] MRN: [id number] Date of Birth: November 23, 1946   Medicare Important Message Given:  Yes     Renie Ora 11/23/2020, 9:57 AM

## 2020-11-23 NOTE — Progress Notes (Signed)
Advanced Heart Failure Rounding Note   Subjective:    Milrinone started 12/25 for co-ox 44%.   Feels ok. Denies CP or SOB.   CVP 6. Co-ox  61%. BMET pending   ECHO: 12/26 EF 20% with LV thrombus. Severe MR. Moderate RV dysfunction. Now on heparin. No bleeding   Objective:   Weight Range:  Vital Signs:   Temp:  [98 F (36.7 C)-99.2 F (37.3 C)] 98.2 F (36.8 C) (12/27 0625) Pulse Rate:  [59-73] 71 (12/27 0625) Resp:  [20-22] 21 (12/27 0625) BP: (103-127)/(63-79) 127/79 (12/27 0041) SpO2:  [95 %-96 %] 95 % (12/27 0625) Weight:  [80.7 kg] 80.7 kg (12/27 0218) Last BM Date: 11/22/20  Weight change: Filed Weights   11/21/20 0110 11/22/20 0000 11/23/20 0218  Weight: 81.1 kg 82 kg 80.7 kg    Intake/Output:   Intake/Output Summary (Last 24 hours) at 11/23/2020 0650 Last data filed at 11/23/2020 0400 Gross per 24 hour  Intake 599.37 ml  Output 375 ml  Net 224.37 ml     Physical Exam: General:  Sitting up in bed.  No resp difficulty HEENT: normal Neck: supple. no JVD. Carotids 2+ bilat; no bruits. No lymphadenopathy or thryomegaly appreciated. Cor: PMI nondisplaced. Regular rate & rhythm. 3/6 MR Lungs: clear Abdomen: soft, nontender, nondistended. No hepatosplenomegaly. No bruits or masses. Good bowel sounds. Extremities: no cyanosis, clubbing, rash, edema Neuro: alert & orientedx3, cranial nerves grossly intact. moves all 4 extremities w/o difficulty. Affect pleasant  Telemetry:  Sinus 60-70s Personally reviewed  Labs: Basic Metabolic Panel: Recent Labs  Lab 11/18/20 0257 11/19/20 0301 11/19/20 1052 11/20/20 0123 11/20/20 0801 11/21/20 0242 11/22/20 0539  NA 140 139 135  --  137 138 138  K 3.1* 4.7 4.0  --  4.2 4.0 3.2*  CL 106 105 104  --  104 103 102  CO2 19* 23 19*  --  22 23 25   GLUCOSE 94 118* 126*  --  129* 98 85  BUN 26* 26* 28*  --  27* 27* 23  CREATININE 1.56* 1.72* 1.74*  --  1.82* 1.96* 1.81*  CALCIUM 8.3* 8.5* 8.2*  --  8.6* 8.4*  8.3*  MG 1.7 2.0  --  2.0  --  1.9 2.0    Liver Function Tests: No results for input(s): AST, ALT, ALKPHOS, BILITOT, PROT, ALBUMIN in the last 168 hours. No results for input(s): LIPASE, AMYLASE in the last 168 hours. No results for input(s): AMMONIA in the last 168 hours.  CBC: Recent Labs  Lab 11/17/20 1644 11/19/20 0301  WBC 7.0 7.2  HGB 10.3* 10.6*  HCT 33.9* 33.0*  MCV 88.5 85.5  PLT 186 189    Cardiac Enzymes: No results for input(s): CKTOTAL, CKMB, CKMBINDEX, TROPONINI in the last 168 hours.  BNP: BNP (last 3 results) Recent Labs    11/17/20 1645  BNP 3,663.4*    ProBNP (last 3 results) No results for input(s): PROBNP in the last 8760 hours.    Other results:  Imaging: ECHOCARDIOGRAM LIMITED  Result Date: 11/22/2020    ECHOCARDIOGRAM LIMITED REPORT   Patient Name:   [name] Date of Exam: 11/22/2020 Medical Rec #:  [id number]          Height:       72.0 in Accession #:    [id number]         Weight:       180.8 lb Date of Birth:  01/11/1946  BSA:          2.041 m Patient Age:    74 years           BP:           103/67 mmHg Patient Gender: M                  HR:           59 bpm. Exam Location:  Inpatient Procedure: Limited Echo, Cardiac Doppler, Color Doppler and Intracardiac            Opacification Agent REPORT CONTAINS CRITICAL RESULT Indications:    CHF-Acute Systolic  History:        Patient has prior history of Echocardiogram examinations, most                 recent 10/24/2020. CAD and Previous Myocardial Infarction, Prior                 CABG and Defibrillator; Risk Factors:Dyslipidemia. ETOH abuse.  Sonographer:    Clayton Lefort RDCS (AE) Referring Phys: [id number] [place]  1. Large laminated mural apical thrombus. Left ventricular ejection fraction, by estimation, is <20%. The left ventricle has severely decreased function. The left ventricle demonstrates severe global hypokinesis with akinesis of the inferior,  inferolateral, inferoseptal and apical walls. The left ventricular internal cavity size was severely dilated.  2. Left atrial size was severely dilated.  3. The posterior MV leaflet is tethered and restricted. . The mitral valve is abnormal. Severe mitral valve regurgitation.  4. The aortic valve is tricuspid. There is mild calcification of the aortic valve. Aortic valve regurgitation is mild.  5. Right ventricular systolic function is moderately reduced. FINDINGS  Left Ventricle: Large laminated mural apical thrombus. Left ventricular ejection fraction, by estimation, is <20%. The left ventricle has severely decreased function. The left ventricle demonstrates global hypokinesis. Definity contrast agent was given IV to delineate the left ventricular endocardial borders. The left ventricular internal cavity size was severely dilated. There is no left ventricular hypertrophy. Right Ventricle: Right ventricular systolic function is moderately reduced. Left Atrium: Left atrial size was severely dilated. Pericardium: There is no evidence of pericardial effusion. Mitral Valve: The posterior MV leaflet is tethered and restricted. The mitral valve is abnormal. There is moderate thickening of the mitral valve leaflet(s). Severe mitral valve regurgitation, with posteriorly-directed jet. Tricuspid Valve: The tricuspid valve is grossly normal. Aortic Valve: The aortic valve is tricuspid. There is mild calcification of the aortic valve. Aortic valve regurgitation is mild. Pulmonic Valve: The pulmonic valve was not assessed. Additional Comments: A pacer wire is visualized. LEFT VENTRICLE PLAX 2D LVIDd:         6.10 cm LVIDs:         5.40 cm LV PW:         1.10 cm LV IVS:        0.90 cm  LEFT ATRIUM         Index LA diam:    4.60 cm 2.25 cm/m  MR Peak grad:    121.9 mmHg MR Mean grad:    68.0 mmHg MR Vmax:         552.00 cm/s MR Vmean:        378.0 cm/s MR PISA:         3.08 cm MR PISA Eff ROA: 21 mm MR PISA Radius:  0.70 cm  Glori Bickers MD Electronically signed by Glori Bickers MD Signature Date/Time:  11/22/2020/8:31:45 PM    Final    [place] EKG SITE RITE  Result Date: 11/21/2020 If Site Rite image not attached, placement could not be confirmed due to current cardiac rhythm.    Medications:     Scheduled Medications: . amiodarone  400 mg Oral BID  . aspirin EC  81 mg Oral Daily  . atorvastatin  80 mg Oral QPM  . buPROPion  300 mg Oral Daily  . Chlorhexidine Gluconate Cloth  6 each Topical Daily  . escitalopram  20 mg Oral Daily  . mexiletine  150 mg Oral Q8H  . multivitamin  1 tablet Oral Daily  . pantoprazole  40 mg Oral QAC breakfast  . potassium chloride  20 mEq Oral Daily  . sodium chloride flush  10-40 mL Intracatheter Q12H  . sodium chloride flush  3 mL Intravenous Q12H  . zolpidem  5 mg Oral QHS    Infusions: . sodium chloride    . sodium chloride 10 mL/hr at 11/23/20 0049  . sodium chloride    . heparin 1,300 Units/hr (11/22/20 2134)  . milrinone 0.25 mcg/kg/min (11/22/20 2143)    PRN Medications: sodium chloride, acetaminophen **OR** acetaminophen, guaiFENesin-dextromethorphan, loperamide, ondansetron (ZOFRAN) IV, sodium chloride flush, sodium chloride flush   Assessment/Plan:   1.Acute on chronic systolic heart failure due to iCM, EF 20-25% -> cardiogenic shock - Echo 11/21 EF 20-25% mild RV dysfunction moderate to severe MR - EF 20% with LV thrombus. Severe MR. Moderate RV dysfunction. Now on heparin. No bleeding - NYHA IV - Low output physiology confirmed Co-ox (12/25) 44%. - Milrinone 0.25 started on 12/25. Co-ox 61% today - Off b-blocker with shock - Off Entresto with worsening renal function.  - Plan RHC todayvia IJ approach - Will begin VAD w/u. Seems to be good candidate. RV has moderate dysfunction but CVP low. Will see what RHC chows. I d/w him and his wife and they are interested. VAD packet provided. VAD coordinators to see today  2.Monomorphic  ventricular tachycardia -No further recurrence. ICD VT zones have been adjusted. Continue amiodarone.  - Keep K> 4.0 Mg > 2.0 -Recent left heart catheterization on 10/23/2020 without any targets for revascularization - Watch for increased ectopy on milrinone. Stable so far  3.CAD status post CABG -Recent left heart cath with severe three-vessel CAD. -He has patent vein grafts. (LIMA -> LAD, SVG-> RCA, SVG -> OM2 -> OM3) -No s/s angina -Continue aspirin and Lipitor  4.Severe mitral regurgitation - pending VAD  5. AKI  - due to cardiorenal/ATN - baseline 1.1 - 1.3-> 1.7 -> 1.8 -> 2.0 -> 1.8 -> pending - improving with hemodynamic support  6. Hypokalemia - BMET pending  7. Apical LV mural thrombus - heparin started.   Length of Stay: 6   Arvilla Meres MD 11/23/2020, 6:50 AM  Advanced Heart Failure Team Pager [phone] (M-F; 7a - 4p)  Please contact [facility] for night-coverage after hours (4p -7a ) and weekends on [email or web address]

## 2020-11-23 NOTE — Progress Notes (Signed)
[facility] EDUCATION NOTE:                VAD evaluation consent reviewed and left at bedside for patient and designated caregiver (Rickie) to continue reviewing.  Initial VAD teaching completed with pt and caregiver.   VAD educational packet including "Understanding Your Options with Advanced Heart Failure", "[place] Patient Agreement for VAD Evaluation and Potential Implantation" consent, and [employer] "Living a More Active Life" HM III booklet", "[place] HM III Patient Education", "[place] Mechanical Circulatory Support Program", and "Decision Aids for Left Ventricular Assist Device" had been left at bedside by Dr. Haroldine Laws for for reference. Reviewed by patient and his wife. All questions answered regarding VAD implant, hospital stay, and what to expect when discharged home living with a heart pump. Pt identified wife (Rickie) as his primary caregiver if therapy should be deemed appropriate for Destination Therapy LVAD.  Explained need for 24/7 care when pt is discharged home due to sternal precautions, adaptation to living on support, emotional support, consistent and meticulous exit site care and management, medication adherence and high volume of follow up visits with the [facility] after discharge; both pt and caregiver verbalized understanding of above.    Explained that LVAD can be implanted for two indications in the setting of advanced left ventricular heart failure treatment:  1. Bridge to transplant - used for patients who cannot safely wait for heart transplant without this device.  Or    2. Destination therapy - used for patients until end of life or recovery of heart function.  Patient and caregiver  acknowledge that the indication at this point in time for LVAD therapy would be for Destination Therapy due to patient's advanced age.    Provided brief equipment overview and demonstration with HeartMate III training loop including discussion on the following:   a) system  controller   b) battery clips   c) Batteries   d) Percutaneous lead   Reviewed and supplied a copy of home inspection check list stressing that only three pronged grounded power outlets can be used for VAD equipment. Patient confirmed home has electrical outlets that will support the equipment along with access working telephone.  Identified the following lifestyle modifications while living on MCS:    1. No driving for at least six weeks and then only if doctor gives permission to do so.   2. No tub baths while pump implanted, and shower only when doctor gives permission.   3. No swimming or submersion in water while implanted with pump.   4. No contact sports or engaging in jumping activities.   5. Always have a backup controller, charged spare batteries, and battery clips nearby at all times in case of emergency.   6. Do not sleep on your stomach.   7. Keep a backup system controller, charged batteries, battery clips, and flashlight near you during sleep in case of electrical power outage.   8. Exit site care including dressing changes, monitoring for infection, and importance of keeping percutaneous lead stabilized at all times.    Extended the option to have one of our current patients to come to talk with them about living on support to assist with decision making. Both say they are a bit overwhelmed today and need more time to process before meeting VAD patient.   Discussed with pt and family that they will be required to purchase dressing supplies as long as patient has the VAD in place.   Reinforced need for  24 hour/7 day week caregivers; pt designated Rickie as caregiver. He will also need to abide by sternal precautions with no lifting >10lbs, pushing, pulling and will need assistance with adapting to new life style with VAD equipment and care.  Patient underwent CABG per Dr. Wilnette Kales in 1991 and is familiar with sternal precautions, ICU stay, ventilator, etc.    Intermacs  patient survival statistics through June 2021 reviewed with patient and caregiver as follows:                            The patient understands that from this discussion it does not mean that they will receive the device, but that depends on an extensive evaluation process. The patient is aware of the fact that if at anytime they want to stop the evaluation process they can.  All questions have been answered at this time and contact information was provided should they encounter any further questions.  They both want to complete reading the consent forms before signing. Asked that VAD Coordinator come by tomorrow to pick up. They both are in agreement that he wants the evaluation this admission.   Zada Girt, RN VAD Coordinator   Office: [phone] 24/7 VAD Pager: [phone]

## 2020-11-23 NOTE — Progress Notes (Addendum)
Electrophysiology Rounding Note  Patient Name: [name] Date of Encounter: 11/23/2020  Primary Cardiologist: Mertie Moores, MD Electrophysiologist: Cristopher Peru, MD   Subjective   The patient is doing well today.  At this time, the patient denies chest pain, shortness of breath, or any new concerns. He asks why his arms and legs have felt so week recently.   RHC this am On milrinone 0.375   RA = 6 RV = 56/10 PA = 51/22 (33) PCW = 19 Thermal cardiac output/index = 5.5/2.7 Fick CO/CI = 6.2/3.0 PVR = 2.3 WU Ao sat = 94% PA sat = 59%, 61% PAPi = 4.8    Assessment: 1. Mild pulmonary venous HTN  2. Volume status improved 3. Cardiac output ok on milrinone 4. PAPI acceptable    Inpatient Medications    Scheduled Meds:  [MAR Hold] amiodarone  400 mg Oral BID   [MAR Hold] aspirin EC  81 mg Oral Daily   [MAR Hold] atorvastatin  80 mg Oral QPM   [MAR Hold] buPROPion  300 mg Oral Daily   [MAR Hold] Chlorhexidine Gluconate Cloth  6 each Topical Daily   [MAR Hold] escitalopram  20 mg Oral Daily   [MAR Hold] mexiletine  150 mg Oral Q8H   [MAR Hold] multivitamin  1 tablet Oral Daily   [MAR Hold] pantoprazole  40 mg Oral QAC breakfast   [MAR Hold] potassium chloride  20 mEq Oral Daily   [MAR Hold] sodium chloride flush  10-40 mL Intracatheter Q12H   [MAR Hold] sodium chloride flush  3 mL Intravenous Q12H   [MAR Hold] zolpidem  5 mg Oral QHS   Continuous Infusions:  sodium chloride     sodium chloride 10 mL/hr at 11/23/20 0049   sodium chloride     heparin Stopped (11/23/20 0640)   milrinone 0.25 mcg/kg/min (11/22/20 2143)   PRN Meds: sodium chloride, [MAR Hold] acetaminophen **OR** [MAR Hold] acetaminophen, [MAR Hold] guaiFENesin-dextromethorphan, [MAR Hold] loperamide, [MAR Hold] ondansetron (ZOFRAN) IV, [MAR Hold] sodium chloride flush, sodium chloride flush   Vital Signs    Vitals:   11/23/20 0743 11/23/20 0748 11/23/20 0753 11/23/20 0757  BP: 140/87  139/84 136/82 137/81  Pulse: 75 74 69 (!) 257  Resp: 16 17 16 15   Temp:      TempSrc:      SpO2: 94% 94% 94% 94%  Weight:      Height:        Intake/Output Summary (Last 24 hours) at 11/23/2020 0839 Last data filed at 11/23/2020 0400 Gross per 24 hour  Intake 590.73 ml  Output 375 ml  Net 215.73 ml   Filed Weights   11/21/20 0110 11/22/20 0000 11/23/20 0218  Weight: 81.1 kg 82 kg 80.7 kg    Physical Exam    GEN- The patient is well appearing, alert and oriented x 3 today.   Head- normocephalic, atraumatic Eyes-  Sclera clear, conjunctiva pink Ears- hearing intact Oropharynx- clear Neck- supple Lungs- Clear to ausculation bilaterally, normal work of breathing Heart- Regular rate and rhythm, no murmurs, rubs or gallops GI- soft, NT, ND, + BS Extremities- no clubbing or cyanosis. No edema Skin- no rash or lesion Psych- euthymic mood, full affect Neuro- strength and sensation are intact  Labs    CBC Recent Labs    11/23/20 0612  WBC 5.8  HGB 9.5*  HCT 30.2*  MCV 84.1  PLT [id number]   Basic Metabolic Panel Recent Labs    11/22/20 0539 11/23/20 0612  NA 138 139  K 3.2* 4.0  CL 102 106  CO2 25 23  GLUCOSE 85 93  BUN 23 17  CREATININE 1.81* 1.53*  CALCIUM 8.3* 8.2*  MG 2.0 1.8   Liver Function Tests No results for input(s): AST, ALT, ALKPHOS, BILITOT, PROT, ALBUMIN in the last 72 hours. No results for input(s): LIPASE, AMYLASE in the last 72 hours. Cardiac Enzymes No results for input(s): CKTOTAL, CKMB, CKMBINDEX, TROPONINI in the last 72 hours.   Telemetry    NSR 60-70s (personally reviewed)  Radiology    CARDIAC CATHETERIZATION  Result Date: 11/23/2020 Findings: On milrinone 0.375 RA = 6 RV = 56/10 PA = 51/22 (33) PCW = 19 Thermal cardiac output/index = 5.5/2.7 Fick CO/CI = 6.2/3.0 PVR = 2.3 WU Ao sat = 94% PA sat = 59%, 61% PAPi = 4.8 Assessment: 1. Mild pulmonary venous HTN 2. Volume status improved 3. Cardiac output ok on milrinone 4. PAPI  acceptable Plan/Discussion: Proceed with LVAD work-up. Glori Bickers, MD 8:14 AM  ECHOCARDIOGRAM LIMITED  Result Date: 11/22/2020    ECHOCARDIOGRAM LIMITED REPORT   Patient Name:   [name] Date of Exam: 11/22/2020 Medical Rec #:  [id number]          Height:       72.0 in Accession #:    [id number]         Weight:       180.8 lb Date of Birth:  [birth year]         BSA:          2.041 m Patient Age:    74 years           BP:           103/67 mmHg Patient Gender: M                  HR:           59 bpm. Exam Location:  Inpatient Procedure: Limited Echo, Cardiac Doppler, Color Doppler and Intracardiac            Opacification Agent REPORT CONTAINS CRITICAL RESULT Indications:    CHF-Acute Systolic  History:        Patient has prior history of Echocardiogram examinations, most                 recent 10/24/2020. CAD and Previous Myocardial Infarction, Prior                 CABG and Defibrillator; Risk Factors:Dyslipidemia. ETOH abuse.  Sonographer:    Clayton Lefort RDCS (AE) Referring Phys: [id number] National  1. Large laminated mural apical thrombus. Left ventricular ejection fraction, by estimation, is <20%. The left ventricle has severely decreased function. The left ventricle demonstrates severe global hypokinesis with akinesis of the inferior, inferolateral, inferoseptal and apical walls. The left ventricular internal cavity size was severely dilated.  2. Left atrial size was severely dilated.  3. The posterior MV leaflet is tethered and restricted. . The mitral valve is abnormal. Severe mitral valve regurgitation.  4. The aortic valve is tricuspid. There is mild calcification of the aortic valve. Aortic valve regurgitation is mild.  5. Right ventricular systolic function is moderately reduced. FINDINGS  Left Ventricle: Large laminated mural apical thrombus. Left ventricular ejection fraction, by estimation, is <20%. The left ventricle has severely decreased function. The left  ventricle demonstrates global hypokinesis. Definity contrast agent was given IV to delineate the left ventricular endocardial borders. The  left ventricular internal cavity size was severely dilated. There is no left ventricular hypertrophy. Right Ventricle: Right ventricular systolic function is moderately reduced. Left Atrium: Left atrial size was severely dilated. Pericardium: There is no evidence of pericardial effusion. Mitral Valve: The posterior MV leaflet is tethered and restricted. The mitral valve is abnormal. There is moderate thickening of the mitral valve leaflet(s). Severe mitral valve regurgitation, with posteriorly-directed jet. Tricuspid Valve: The tricuspid valve is grossly normal. Aortic Valve: The aortic valve is tricuspid. There is mild calcification of the aortic valve. Aortic valve regurgitation is mild. Pulmonic Valve: The pulmonic valve was not assessed. Additional Comments: A pacer wire is visualized. LEFT VENTRICLE PLAX 2D LVIDd:         6.10 cm LVIDs:         5.40 cm LV PW:         1.10 cm LV IVS:        0.90 cm  LEFT ATRIUM         Index LA diam:    4.60 cm 2.25 cm/m  MR Peak grad:    121.9 mmHg MR Mean grad:    68.0 mmHg MR Vmax:         552.00 cm/s MR Vmean:        378.0 cm/s MR PISA:         3.08 cm MR PISA Eff ROA: 21 mm MR PISA Radius:  0.70 cm Arvilla Meres MD Electronically signed by Arvilla Meres MD Signature Date/Time: 11/22/2020/8:31:45 PM    Final     Patient Profile     73 y.o. male with a hx of CAD (CABG 1991), ICM, ICD, chronic CHF (systolic), VT, HTN, HLD, prostate CA and DVT, CKD (III) admitted with progressive SOB and acute/chronic CHF   Noted in ER to develop VT 120's under his detection rate, EP was called to evaluate     Device information MDT single chamber ICD implanted 2008 > gen change 2016 Interrogation noted 11/16/20 that he had an episode of his fast VT treated successfully with ATP 3rd VT zone was added at 120bpm with ATP schemes only, no  HV therapy  Assessment & Plan    1. Slow VT ; previously below detection Device reprogrammed Quiescent on continued oral mexiletine and amiodarone.  Keep K > 4.0 and Mg > 2.0   2. Acute on chronic systolic CHF -> cardiogenic shock Echo 11/21 EF 20-25% mild RV dysfunction moderate to severe MR EF 20% with LV thrombus. Severe MR. Moderate RV dysfunction. Now on heparin. No bleeding NYHA IV Low output physiology confirmed Co-ox (12/25) 44%. Off BB with shock RHC today as above.  VAD work up ongoing.   3. CAD s/p CABG No s/s angina Recent LHC with severe three vessel CAD And patent vein grafts.  4. Severe MR VAD work up on-going Will defer consideration of TEE to CHF team.   5. Apical LV mural thrombus On heparin.  Pharmacy dosing.  For questions or updates, please contact [facility] Please consult [email or web address] for contact info under Cardiology/STEMI.  Signed, Graciella Freer, PA-C  11/23/2020, 8:39 AM   EP attending  Patient seen and examined. Agree with the findings as noted above. The patient presents with VT and worsening chronic systolic heart failure with a low output state, place on IV milrinone with improvement. He has had much less VT. He will continue high dose oral amiodarone. He is being worked up for a possible LVAD though he is not  particularly interested in this therapy at this time. Note plan for TEE.  He has a known apical LV thrombus. His VT has quieted down. At this point, I'd continue aggressive medical therapy and oral amiodarone.   Carleene Overlie Nilah Belcourt,MD

## 2020-11-24 ENCOUNTER — Inpatient Hospital Stay (HOSPITAL_COMMUNITY): Payer: Medicare Other

## 2020-11-24 ENCOUNTER — Ambulatory Visit: Payer: Medicare Other | Admitting: Physician Assistant

## 2020-11-24 DIAGNOSIS — I6522 Occlusion and stenosis of left carotid artery: Secondary | ICD-10-CM

## 2020-11-24 DIAGNOSIS — I5043 Acute on chronic combined systolic (congestive) and diastolic (congestive) heart failure: Secondary | ICD-10-CM

## 2020-11-24 DIAGNOSIS — Z515 Encounter for palliative care: Secondary | ICD-10-CM | POA: Diagnosis not present

## 2020-11-24 DIAGNOSIS — Z7189 Other specified counseling: Secondary | ICD-10-CM

## 2020-11-24 DIAGNOSIS — Z01810 Encounter for preprocedural cardiovascular examination: Secondary | ICD-10-CM | POA: Diagnosis not present

## 2020-11-24 LAB — URINALYSIS, ROUTINE W REFLEX MICROSCOPIC
Bilirubin Urine: NEGATIVE
Glucose, UA: NEGATIVE mg/dL
Hgb urine dipstick: NEGATIVE
Ketones, ur: NEGATIVE mg/dL
Leukocytes,Ua: NEGATIVE
Nitrite: NEGATIVE
Protein, ur: NEGATIVE mg/dL
Specific Gravity, Urine: 1.008 (ref 1.005–1.030)
pH: 7 (ref 5.0–8.0)

## 2020-11-24 LAB — COMPREHENSIVE METABOLIC PANEL
ALT: 127 U/L — ABNORMAL HIGH (ref 0–44)
AST: 40 U/L (ref 15–41)
Albumin: 2.9 g/dL — ABNORMAL LOW (ref 3.5–5.0)
Alkaline Phosphatase: 110 U/L (ref 38–126)
Anion gap: 8 (ref 5–15)
BUN: 11 mg/dL (ref 8–23)
CO2: 23 mmol/L (ref 22–32)
Calcium: 8.4 mg/dL — ABNORMAL LOW (ref 8.9–10.3)
Chloride: 107 mmol/L (ref 98–111)
Creatinine, Ser: 1.5 mg/dL — ABNORMAL HIGH (ref 0.61–1.24)
GFR, Estimated: 49 mL/min — ABNORMAL LOW (ref >60)
Glucose, Bld: 91 mg/dL (ref 70–99)
Potassium: 4.2 mmol/L (ref 3.5–5.1)
Sodium: 138 mmol/L (ref 135–145)
Total Bilirubin: 1.5 mg/dL — ABNORMAL HIGH (ref 0.3–1.2)
Total Protein: 5.5 g/dL — ABNORMAL LOW (ref 6.5–8.1)

## 2020-11-24 LAB — CBC WITH DIFFERENTIAL/PLATELET
Abs Immature Granulocytes: 0.01 10*3/uL (ref 0.00–0.07)
Basophils Absolute: 0.1 10*3/uL (ref 0.0–0.1)
Basophils Relative: 1 %
Eosinophils Absolute: 0.3 10*3/uL (ref 0.0–0.5)
Eosinophils Relative: 6 %
HCT: 31 % — ABNORMAL LOW (ref 39.0–52.0)
Hemoglobin: 9.8 g/dL — ABNORMAL LOW (ref 13.0–17.0)
Immature Granulocytes: 0 %
Lymphocytes Relative: 21 %
Lymphs Abs: 1.1 10*3/uL (ref 0.7–4.0)
MCH: 27 pg (ref 26.0–34.0)
MCHC: 31.6 g/dL (ref 30.0–36.0)
MCV: 85.4 fL (ref 80.0–100.0)
Monocytes Absolute: 0.5 10*3/uL (ref 0.1–1.0)
Monocytes Relative: 10 %
Neutro Abs: 3.3 10*3/uL (ref 1.7–7.7)
Neutrophils Relative %: 62 %
Platelets: 244 10*3/uL (ref 150–400)
RBC: 3.63 MIL/uL — ABNORMAL LOW (ref 4.22–5.81)
RDW: 15.2 % (ref 11.5–15.5)
WBC: 5.3 10*3/uL (ref 4.0–10.5)
nRBC: 0 % (ref 0.0–0.2)

## 2020-11-24 LAB — PULMONARY FUNCTION TEST
DL/VA % pred: 72 %
DL/VA: 2.87 ml/min/mmHg/L
DLCO cor % pred: 45 %
DLCO cor: 12.24 ml/min/mmHg
DLCO unc % pred: 37 %
DLCO unc: 10.18 ml/min/mmHg
FEF 25-75 Pre: 1.92 L/sec
FEF2575-%Pred-Pre: 76 %
FEV1-%Pred-Pre: 71 %
FEV1-Pre: 2.42 L
FEV1FVC-%Pred-Pre: 99 %
FEV6-%Pred-Pre: 75 %
FEV6-Pre: 3.32 L
FEV6FVC-%Pred-Pre: 105 %
FVC-%Pred-Pre: 71 %
FVC-Pre: 3.32 L
Pre FEV1/FVC ratio: 73 %
Pre FEV6/FVC Ratio: 100 %
RV % pred: 27 %
RV: 0.72 L
TLC % pred: 55 %
TLC: 4.12 L

## 2020-11-24 LAB — COOXEMETRY PANEL
Carboxyhemoglobin: 1.5 % (ref 0.5–1.5)
Methemoglobin: 0.7 % (ref 0.0–1.5)
O2 Saturation: 62.8 %
Total hemoglobin: 10 g/dL — ABNORMAL LOW (ref 12.0–16.0)

## 2020-11-24 LAB — HEPATITIS B CORE ANTIBODY, IGM: Hep B C IgM: NEGATIVE — AB

## 2020-11-24 LAB — HIV ANTIBODY (ROUTINE TESTING W REFLEX): HIV Screen 4th Generation wRfx: NONREACTIVE

## 2020-11-24 LAB — MAGNESIUM: Magnesium: 2.2 mg/dL (ref 1.7–2.4)

## 2020-11-24 LAB — HEPARIN LEVEL (UNFRACTIONATED): Heparin Unfractionated: 0.36 IU/mL (ref 0.30–0.70)

## 2020-11-24 LAB — HEPATITIS B SURFACE ANTIGEN: Hepatitis B Surface Ag: NONREACTIVE

## 2020-11-24 LAB — HEPATITIS B SURFACE ANTIBODY,QUALITATIVE: Hep B S Ab: NONREACTIVE

## 2020-11-24 LAB — HEPATITIS C ANTIBODY: HCV Ab: NONREACTIVE

## 2020-11-24 MED ORDER — ENSURE ENLIVE PO LIQD
237.0000 mL | Freq: Two times a day (BID) | ORAL | Status: DC
  Administered 2020-11-24 – 2020-12-05 (×22): 237 mL via ORAL

## 2020-11-24 MED ORDER — ADULT MULTIVITAMIN W/MINERALS CH
1.0000 | ORAL_TABLET | Freq: Every day | ORAL | Status: DC
  Administered 2020-11-24 – 2020-12-02 (×9): 1 via ORAL
  Filled 2020-11-24 (×9): qty 1

## 2020-11-24 NOTE — Progress Notes (Signed)
[place] for heparin Indication: LV thrombus 12/26   Allergies  Allergen Reactions   Other Anaphylaxis    Insect bites (Has EpiPen)    Patient Measurements: Height: 6' (182.9 cm) Weight: 80.6 kg (177 lb 11.1 oz) (scale b) IBW/kg (Calculated) : 77.6 Heparin Dosing Weight: 82kg  Vital Signs: Temp: 98 F (36.7 C) (12/28 0735) Temp Source: Oral (12/28 0735) BP: 118/69 (12/28 0850) Pulse Rate: 69 (12/28 0850)  Labs: Recent Labs    11/22/20 0539 11/23/20 0612 11/23/20 0612 11/23/20 0754 11/23/20 1622 11/23/20 1930 11/23/20 2133 11/24/20 0450  HGB  --  9.5*   < > 10.9*   10.2*  --   --   --  9.8*  HCT  --  30.2*  --  32.0*   30.0*  --   --   --  31.0*  PLT  --  232  --   --   --   --   --  244  LABPROT  --   --   --   --  14.2  --   --   --   INR  --   --   --   --  1.1  --   --   --   HEPARINUNFRC  --  1.02*   < >  --   --  1.10* 0.29* 0.36  CREATININE 1.81* 1.53*  --   --   --   --   --  1.50*   < > = values in this interval not displayed.    Estimated Creatinine Clearance: 48.1 mL/min (A) (by C-G formula based on SCr of 1.5 mg/dL (H)).   Medical History: Past Medical History:  Diagnosis Date   Adult ADHD    AICD (automatic cardioverter/defibrillator) present    CAD (coronary artery disease)    Cath 2008, all grafts patent // Cath 2017 - all grafts patent    Cardiomyopathy, ischemic    EF (09) 25%, ICD Placed 2008 (epidoses of sustained monomorphic VT in the past)   Carotid artery disease ([place])    Doppler, February, 2012, mild plaque, 0-39% bilateral, plan followup in one year   Dyspepsia    Ejection fraction < 50%    Elevated PSA 02/15/2011   Esophageal stricture    GERD (gastroesophageal reflux disease)    HTN (hypertension)    Hx of CABG    1991   Hyperlipidemia    ICD (implantable cardiac defibrillator) battery depletion    Placed 2008  (eepisodes  nonsustained monomorphic VT in the past)    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 Pharmacy consulted for IV heparin. No AC PTA.   Heparin level this morning came back therapeutic at 0.36, on 1250 units/hr. Hgb 9.8, plt 244. LDH 198. No s/sx of bleeding or infusion issues.   Goal of Therapy:  Heparin level 0.3-0.7 units/ml Monitor platelets by anticoagulation protocol: Yes   Plan:  Continue heparin infusion to 1250 units/hr Monitor daily HL, CBC/plt Monitor for signs/symptoms of bleeding  F/u long term anticoagulation plan  Antonietta Jewel, PharmD, [place] Pharmacist  Phone: [phone] 11/24/2020 9:43 AM  Please  check AMION for all [facility] Pharmacy phone numbers After 10:00 PM, call Main Pharmacy [phone]

## 2020-11-24 NOTE — Consult Note (Addendum)
Consultation Note Date: 11/24/2020   Patient Name: [name]  DOB: [birth year]  MRN: [id number]  Age / Sex: 74 y.o., male  PCP: Hoyt Koch, MD Referring Physician: Mckinley Jewel, MD  Reason for Consultation: VAD eval  HPI/Patient Profile: 74 y.o. male  with past medical history of CAD s/p CABG 1991, chronic combined systolic and diastolic HCF EF 65-53%, history of VT, s/p [employer] AICD, hypertension, hyperlipidemia, ADHD, CKD stage 3a, history of DVT, esophageal stricture, GERD, history of prostate (2012) and tonsillar (2005) cancers admitted on 11/17/2020 with cardiogenic shock and acute on chronic renal failure. EF 20% with LV thrombus, severe MR, moderate RV dysfunction. Began milrinone infusion.   Clinical Assessment and Goals of Care: I met today with [name] along with his [name], [name]. [name] is clear in his hopes that he will be able to pursue LVAD. We did discuss the reason for extensive work up to ensure that he is thought to be a good candidate so we can anticipate benefits from LVAD. They both understand. They express interest in completed Advance Directive. [name] expresses desire for DNR status as well. I clarified that if something were to happen tonight what his wishes would be and he tells me DNR and no desire for resuscitation. He expresses understanding of basics of LVAD and care but does not describe this too me in any detail today. He was able to recall visit with LVAD patient by name and that this visit was helpful. He has no fears or concerns regarding LVAD at this time. He is hopeful that he is a candidate.   MOST completed: DNR, limited interventions, determine benefits/limitations of antibiotics at time of infection, IVF for trial period, temporary feeding tube ok (no long term). He is okay with short term aggressive care with LVAD but would not want  long term feeding tube, tracheostomy, or dialysis. He has very clear desires for measures that are expected to increase his quality of life but not interested in pursuing aggressive measures to prolong life if his health or quality worsen. [name], [name], present and they have been married 51 years and she has good understanding of his wishes and what would be an acceptable quality of life for him.   We also completed HCPOA/Living Will and ready to be notarized and will arrange to be done tomorrow. [name], [name], is HCPOA. He does not want his life prolonged in the 3 scenarios and would not desire artificial nutrition either. Indicates he would desire organ donation and his [name] would make final decisions for him.   All questions/concerns addressed. Emotional support provided.   Primary Decision Maker PATIENT    SUMMARY OF RECOMMENDATIONS   - Expressed DNR desire (notified heart failure team) - Open to short term aggressive care to pursue LVAD but has firm limits as outlined above - MOST completed - Advance Directive completed and awaiting to be notarized  Code Status/Advance Care Planning:  DNR   Symptom Management:   Per heart failure team  Palliative Prophylaxis:   Delirium Protocol   Psycho-social/Spiritual:   Desire for further Chaplaincy support:yes  Prognosis:   Overall prognosis poor without advance therapies such as LVAD.   Discharge Planning: To Be Determined      Primary Diagnoses: Present on Admission: . Carotid artery disease ([place]) . Hyperlipidemia with target LDL less than 70   I have reviewed the medical record, interviewed the patient and family, and examined the patient. The following aspects are pertinent.  Past Medical History:  Diagnosis Date  . Adult ADHD   . AICD (automatic cardioverter/defibrillator) present   . CAD (coronary artery disease)    Cath 2008, all grafts patent // Cath 2017 - all grafts patent   . Cardiomyopathy, ischemic    EF  (09) 25%, ICD Placed 2008 (epidoses of sustained monomorphic VT in the past)  . Carotid artery disease ([place])    Doppler, February, 2012, mild plaque, 0-39% bilateral, plan followup in one year  . Dyspepsia   . Ejection fraction < 50%   . Elevated PSA 02/15/2011  . Esophageal stricture   . GERD (gastroesophageal reflux disease)   . HTN (hypertension)   . Hx of CABG    1991  . Hyperlipidemia   . 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 Myocardial infarction ([place]) 1998  . Prostate cancer ([place]) 2012   IMRT, ADT  . Thrombosis of arm    Left  . Venous thrombosis    Left arm   Social History   Socioeconomic History  . Marital status: Married    Spouse name: Not on file  . Number of children: 0  . Years of education: 42  . Highest education level: Not on file  Occupational History  . Occupation: Retired    Fish farm manager: RETIRED    CommentOceanographer for [employer]  Tobacco Use  . Smoking status: Never Smoker  . Smokeless tobacco: Never Used  Vaping Use  . Vaping Use: Never used  Substance and Sexual Activity  . Alcohol use: No  . Drug use: No  . Sexual activity: Yes    Birth control/protection: None  Other Topics Concern  . Not on file  Social History Narrative   [employer], [employer]. 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 Marriage in good health, [name] in good health. ACP discussed Jan '15: no CPR, no mechanical ventilation. He is referred to [facility] and provided with packet with HCPOA-his [name]./ Advanced directive.   Fun/Hobby: Yard work, Architect working.    Social Determinants of Health   Financial Resource Strain: Not on file  Food Insecurity: Not on file  Transportation Needs: Not on file  Physical Activity: Not on file  Stress: Not on file  Social Connections: Not on file   Family History  Problem Relation Age of Onset   . Alzheimer's disease Mother   . Dementia Mother        living in [place]  . Hyperlipidemia Father   . Hypertension Father   . CAD Father        had pacemaker  . Congestive Heart Failure Father   . Heart disease Brother        Bypass surgery  . Breast cancer Sister        survivor  . Colon cancer Neg Hx   . Prostate cancer Neg Hx   . Diabetes  Neg Hx    Scheduled Meds: . amiodarone  400 mg Oral BID  . aspirin EC  81 mg Oral Daily  . atorvastatin  80 mg Oral QPM  . buPROPion  300 mg Oral Daily  . Chlorhexidine Gluconate Cloth  6 each Topical Daily  . escitalopram  20 mg Oral Daily  . feeding supplement  237 mL Oral BID BM  . mexiletine  150 mg Oral Q8H  . multivitamin with minerals  1 tablet Oral Daily  . pantoprazole  40 mg Oral QAC breakfast  . sodium chloride flush  10-40 mL Intracatheter Q12H  . sodium chloride flush  3 mL Intravenous Q12H  . zolpidem  5 mg Oral QHS   Continuous Infusions: . heparin 1,250 Units/hr (11/24/20 0842)  . milrinone 0.25 mcg/kg/min (11/24/20 0840)   PRN Meds:.acetaminophen **OR** acetaminophen, guaiFENesin-dextromethorphan, loperamide, ondansetron (ZOFRAN) IV, sodium chloride flush Allergies  Allergen Reactions  . Other Anaphylaxis    Insect bites (Has EpiPen)   Review of Systems  Constitutional: Positive for activity change, appetite change and fatigue.  Neurological: Positive for weakness.    Physical Exam Vitals and nursing note reviewed.  Constitutional:      General: He is not in acute distress.    Appearance: He is ill-appearing.  Cardiovascular:     Rate and Rhythm: Normal rate.  Pulmonary:     Effort: Pulmonary effort is normal. No tachypnea, accessory muscle usage or respiratory distress.  Abdominal:     General: Abdomen is flat.  Neurological:     Mental Status: He is alert and oriented to person, place, and time.     Comments: Noted to be forgetful at times     Vital Signs: BP 122/78 (BP Location: Left Arm)   Pulse  67   Temp 98.1 F (36.7 C) (Oral)   Resp 17   Ht 6' (1.829 m)   Wt 80.6 kg Comment: scale b  SpO2 97%   BMI 24.10 kg/m  Pain Scale: 0-10   Pain Score: 0-No pain   SpO2: SpO2: 97 % O2 Device:SpO2: 97 % O2 Flow Rate: .O2 Flow Rate (L/min): 2 L/min  IO: Intake/output summary:   Intake/Output Summary (Last 24 hours) at 11/24/2020 1143 Last data filed at 11/24/2020 6160 Gross per 24 hour  Intake 1035.64 ml  Output 2300 ml  Net -1264.36 ml    LBM: Last BM Date: 11/22/20 Baseline Weight: Weight: 83.9 kg Most recent weight: Weight: 80.6 kg (scale b)     Palliative Assessment/Data:     Time In: 1315 Time Out: 1425 Time Total: 70 min Greater than 50%  of this time was spent counseling and coordinating care related to the above assessment and plan.  Signed by: Vinie Sill, NP Palliative Medicine Team Pager # [phone] (M-F 8a-5p) Team Phone # [phone] (Nights/Weekends)

## 2020-11-24 NOTE — Progress Notes (Signed)
  Telemetry personally reviewed. Rhythm remains stable on po amiodarone and mexitil.   Would continue amiodarone 400 mg BID for at least 1-2 weeks before titrating down; Likely 2 weeks given now on milrinone.   EP to see as needed while here.   Casimiro Needle [id number] [place], PA-C  11/24/2020 8:07 AM

## 2020-11-24 NOTE — Consult Note (Addendum)
CONSULT NOTE   MRN : [id number]  Reason for Consult: Left ICA stenosis Referring Physician: Dr. Haynes Dage  History of Present Illness: 74 y/o male admitted with a CC of SOB.  He states he has had a low grade fever, N/V for 3 to 4 days.  He is negative for COVID and  SARS.  During his work up he had a carotid duplex performed showed left ICA stenosis > 80 %.  He denies history of stroke or TIA.  He denise weakness, amaurosis, or aphasia.    Past medical history includes: CAD status post CABG and PCI, chronic combined systolic and diastolic CHF/ischemic cardiomyopathy with LVEF 20-25% and history of VT status post ICD, hypertension, hyperlipidemia, CKD stage IIIa, history of prostate and tonsillar cancer, history of DVT  Cardiology work up shows 20-25% EF.  He did under go a cardiac cath yesterday:   Assessment: 1. Mild pulmonary venous HTN  2. Volume status improved 3. Cardiac output ok on milrinone 4. PAPI acceptable They are continuing an LVAD work-up. Last month was here with HF and VT/shocks and was started on amiodarone he tells me he was in the 1st down titration and taking 400mg  daily  Current Facility-Administered Medications  Medication Dose Route Frequency Provider Last Rate Last Admin  . acetaminophen (TYLENOL) tablet 650 mg  650 mg Oral Q6H PRN Shela Leff, MD       Or  . acetaminophen (TYLENOL) suppository 650 mg  650 mg Rectal Q6H PRN Shela Leff, MD      . amiodarone (PACERONE) tablet 400 mg  400 mg Oral BID Baldwin Jamaica, PA-C   400 mg at 11/24/20 0844  . aspirin EC tablet 81 mg  81 mg Oral Daily Shela Leff, MD   81 mg at 11/24/20 0844  . atorvastatin (LIPITOR) tablet 80 mg  80 mg Oral QPM Shela Leff, MD   80 mg at 11/23/20 1808  . buPROPion (WELLBUTRIN XL) 24 hr tablet 300 mg  300 mg Oral Daily Shela Leff, MD   300 mg at 11/24/20 0843  . Chlorhexidine Gluconate Cloth 2 % PADS 6 each  6 each Topical Daily Pahwani, Michell Heinrich, MD   6 each at 11/24/20 0848  . escitalopram (LEXAPRO) tablet 20 mg  20 mg Oral Daily Shela Leff, MD   20 mg at 11/24/20 0844  . feeding supplement (ENSURE ENLIVE / ENSURE PLUS) liquid 237 mL  237 mL Oral BID BM Pahwani, Rinka R, MD   237 mL at 11/24/20 1301  . guaiFENesin-dextromethorphan (ROBITUSSIN DM) 100-10 MG/5ML syrup 5 mL  5 mL Oral Q4H PRN Shela Leff, MD      . heparin ADULT infusion 100 units/mL (25000 units/220mL)  1,250 Units/hr Intravenous Continuous Donnamae Jude, RPH 12.5 mL/hr at 11/24/20 0842 1,250 Units/hr at 11/24/20 0842  . loperamide (IMODIUM) capsule 2 mg  2 mg Oral PRN Pahwani, Rinka R, MD   2 mg at 11/22/20 1428  . mexiletine (MEXITIL) capsule 150 mg  150 mg Oral Q8H Baldwin Jamaica, PA-C   150 mg at 11/24/20 1300  . milrinone (PRIMACOR) 20 MG/100 ML (0.2 mg/mL) infusion  0.25 mcg/kg/min Intravenous Continuous Bensimhon, Shaune Pascal, MD 6.08 mL/hr at 11/24/20 0840 0.25 mcg/kg/min at 11/24/20 0840  . multivitamin with minerals tablet 1 tablet  1 tablet Oral Daily Pahwani, Rinka R, MD   1 tablet at 11/24/20 1300  . ondansetron (ZOFRAN) injection 4 mg  4 mg Intravenous Q6H PRN Pahwani, Rinka  R, MD      . pantoprazole (PROTONIX) EC tablet 40 mg  40 mg Oral QAC breakfast Shela Leff, MD   40 mg at 11/24/20 0844  . sodium chloride flush (NS) 0.9 % injection 10-40 mL  10-40 mL Intracatheter Q12H Pahwani, Rinka R, MD   10 mL at 11/21/20 2136  . sodium chloride flush (NS) 0.9 % injection 10-40 mL  10-40 mL Intracatheter PRN Pahwani, Rinka R, MD      . sodium chloride flush (NS) 0.9 % injection 3 mL  3 mL Intravenous Q12H Theora Gianotti, NP   3 mL at 11/21/20 1345  . zolpidem (AMBIEN) tablet 5 mg  5 mg Oral QHS Pahwani, Rinka R, MD   5 mg at 11/23/20 2145    Pt meds include: Statin :Yes Betablocker: No ASA: Yes Other anticoagulants/antiplatelets: Heparin  Past Medical History:  Diagnosis Date  . Adult ADHD   . AICD (automatic  cardioverter/defibrillator) present   . CAD (coronary artery disease)    Cath 2008, all grafts patent // Cath 2017 - all grafts patent   . 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 Carotid artery disease ([place])    Doppler, February, 2012, mild plaque, 0-39% bilateral, plan followup in one year  . Dyspepsia   . Ejection fraction < 50%   . Elevated PSA 02/15/2011  . Esophageal stricture   . GERD (gastroesophageal reflux disease)   . HTN (hypertension)   . Hx of CABG    1991  . Hyperlipidemia   . 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 Malignant neoplasm of tonsil ([place])   . Myocardial infarction ([place]) 1998  . Prostate cancer ([place]) 2012   IMRT, ADT  . Thrombosis of arm    Left  . Venous thrombosis    Left arm    Past Surgical History:  Procedure Laterality Date  . CARDIAC CATHETERIZATION  12/05/06  . CARDIAC CATHETERIZATION N/A 12/21/2015   Procedure: Left Heart Cath and Coronary Angiography;  Surgeon: Lorretta Harp, MD;  Location: [facility];  Service: Cardiovascular;  Laterality: N/A;  . CORONARY ARTERY BYPASS GRAFT     LIMA_LAD, SVG-OM, Cx, RCA '91  . CYSTECTOMY     Hand  . 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 ICD Placement    . IMPLANTABLE CARDIOVERTER DEFIBRILLATOR (ICD) GENERATOR CHANGE  04/06/2015   Procedure: Icd Generator Change;  Surgeon: Evans Lance, MD;  Location: [facility];  Service: Cardiovascular;;  . LEFT HEART CATH AND CORS/GRAFTS ANGIOGRAPHY N/A 10/23/2020   Procedure: LEFT HEART CATH AND CORS/GRAFTS ANGIOGRAPHY;  Surgeon: Jettie Booze, MD;  Location: [facility];  Service: Cardiovascular;  Laterality: N/A;  . menistectomies, left knee  '09  . RIGHT HEART CATH N/A 11/23/2020   Procedure: RIGHT HEART CATH;  Surgeon:  Jolaine Artist, MD;  Location: [facility];  Service: Cardiovascular;  Laterality: N/A;  . ROTATOR CUFF REPAIR  2005, 2007  . Thumb Surgery    . TONSILLECTOMY  2005   Malignant lesion, w/XRT    Social History Social History   Tobacco Use  . Smoking status: Never Smoker  . Smokeless tobacco: Never Used  Vaping Use  . Vaping Use: Never used  Substance  Use Topics  . Alcohol use: No  . Drug use: No    Family History Family History  Problem Relation Age of Onset  . Alzheimer's disease Mother   . Dementia Mother        living in [place]  . Hyperlipidemia Father   . Hypertension Father   . CAD Father        had pacemaker  . Congestive Heart Failure Father   . Heart disease Brother        Bypass surgery  . Breast cancer Sister        survivor  . Colon cancer Neg Hx   . Prostate cancer Neg Hx   . Diabetes Neg Hx     Allergies  Allergen Reactions  . Other Anaphylaxis    Insect bites (Has EpiPen)     REVIEW OF SYSTEMS  General: [ ]  Weight loss, [ ]  Fever, [ ]  chills Neurologic: [x ] Dizziness, [ ]  Blackouts, [ ]  Seizure [ ]  Stroke, [ ]  "Mini stroke", [ ]  Slurred speech, [ ]  Temporary blindness; [ ]  weakness in arms or legs, [ ]  Hoarseness [ ]  Dysphagia Cardiac: [ ]  Chest pain/pressure, [x ] Shortness of breath at rest [ ]  Shortness of breath with exertion, [ ]  Atrial fibrillation or irregular heartbeat  Vascular: [ ]  Pain in legs with walking, [ ]  Pain in legs at rest, [ ]  Pain in legs at night,  [ ]  Non-healing ulcer, [x ]history of Blood clot in vein/DVT,   Pulmonary: [ ]  Home oxygen, [ ]  Productive cough, [ ]  Coughing up blood, [ ]  Asthma,  [ ]  Wheezing [ ]  COPD Musculoskeletal:  [ ]  Arthritis, [ ]  Low back pain, [ ]  Joint pain Hematologic: [ ]  Easy Bruising, [ ]  Anemia; [ ]  Hepatitis Gastrointestinal: [ ]  Blood in stool, [ ]  Gastroesophageal Reflux/heartburn, Urinary: [ ]  chronic Kidney disease, [ ]  on HD - [ ]  MWF or [ ]  TTHS, [ ]  Burning with urination, [  ] Difficulty urinating Skin: [ ]  Rashes, [ ]  Wounds Psychological: [ ]  Anxiety, [ ]  Depression  Physical Examination Vitals:   11/24/20 0735 11/24/20 0844 11/24/20 0850 11/24/20 1132  BP: 126/78 118/69 118/69 122/78  Pulse: 62 66 69 67  Resp: 19 18 18 17   Temp: 98 F (36.7 C)   98.1 F (36.7 C)  TempSrc: Oral   Oral  SpO2: 95% 97% 97% 97%  Weight:      Height:       Body mass index is 24.1 kg/m.  General:  WDWN in NAD HENT: WNL Eyes: Pupils equal Pulmonary: normal non-labored breathing , without Rales, rhonchi,  wheezing Cardiac: RRR, without  Murmurs, rubs or gallops; No carotid bruits Abdomen: soft, NT, no masses Skin: no rashes, ulcers noted;  no Gangrene , no cellulitis; no open wounds;   Vascular Exam/Pulses:Palpable radial and DP/PT pulses B   Musculoskeletal: no muscle wasting or atrophy; no edema  Neurologic: A&O X 3; Appropriate Affect ;  SENSATION: normal; MOTOR FUNCTION: UE and LE motor intact and  Symmetric Speech is fluent/normal   Significant Diagnostic Studies: CBC Lab Results  Component Value Date   WBC 5.3 11/24/2020   HGB 9.8 (L) 11/24/2020   HCT 31.0 (L) 11/24/2020   MCV 85.4 11/24/2020   PLT 244 11/24/2020    BMET    Component Value Date/Time   NA 138 11/24/2020 0450   NA 140 10/27/2020 0000   K 4.2 11/24/2020 0450  CL 107 11/24/2020 0450   CO2 23 11/24/2020 0450   GLUCOSE 91 11/24/2020 0450   BUN 11 11/24/2020 0450   BUN 20 10/27/2020 0000   CREATININE 1.50 (H) 11/24/2020 0450   CREATININE 1.21 11/01/2016 1100   CALCIUM 8.4 (L) 11/24/2020 0450   GFRNONAA 49 (L) 11/24/2020 0450   GFRAA 59 (L) 10/27/2020 0000   Estimated Creatinine Clearance: 48.1 mL/min (A) (by C-G formula based on SCr of 1.5 mg/dL (H)).  COAG Lab Results  Component Value Date   INR 1.1 11/23/2020   INR 1.08 12/21/2015   INR 0.98 04/06/2015     Non-Invasive Vascular Imaging:    Right Carotid Findings:   +----------+--------+--------+--------+---------------------------+--------  +       PSV cm/sEDV cm/sStenosisDescribe           Comments  +----------+--------+--------+--------+---------------------------+--------  +  CCA Prox 87   21                             +----------+--------+--------+--------+---------------------------+--------  +  CCA Distal73   22       heterogenous                +----------+--------+--------+--------+---------------------------+--------  +  ICA Prox 61   17   1-39%  heterogenous and hypoechoic        +----------+--------+--------+--------+---------------------------+--------  +  ICA Distal58   22                             +----------+--------+--------+--------+---------------------------+--------  +  ECA    103   11                             +----------+--------+--------+--------+---------------------------+--------  +   Portions of this table do not appear on this page.       +----------+--------+-------+----------------+------------+       PSV cm/sEDV cmsDescribe    Arm Pressure  +----------+--------+-------+----------------+------------+  Subclavian108       Multiphasic, WNL        +----------+--------+-------+----------------+------------+   +---------+--------+--+--------+--+---------+  VertebralPSV cm/s34EDV cm/s12Antegrade  +---------+--------+--+--------+--+---------+   Left Carotid Findings:  +----------+--------+--------+--------+-------------------+----------------  ----+       PSV cm/sEDV cm/sStenosisDescribe      Comments         +----------+--------+--------+--------+-------------------+----------------  ----+  CCA Prox 106   27       hyperechoic and                                 heterogenous                 +----------+--------+--------+--------+-------------------+----------------  ----+  CCA Mid  159   40       heterogenous and                                hypoechoic                  +----------+--------+--------+--------+-------------------+----------------  ----+  CCA Distal182   34       heterogenous                 +----------+--------+--------+--------+-------------------+----------------  ----+  ICA Prox 302   102   80-99% heterogenous and  Velocities may  hypoechoic     underestimate  degree                             of stenosis due  to                              more proximal                                 obstruction.       +----------+--------+--------+--------+-------------------+----------------  ----+  ICA Mid  281   113   80-99% heterogenous                 +----------+--------+--------+--------+-------------------+----------------  ----+  ICA Distal137   40                              +----------+--------+--------+--------+-------------------+----------------  ----+  ECA    87   11                              +----------+--------+--------+--------+-------------------+----------------  ----+   +----------+--------+--------+----------------+------------+  SubclavianPSV cm/sEDV cm/sDescribe    Arm Pressure  +----------+--------+--------+----------------+------------+       152       Multiphasic, [id number]        +----------+--------+--------+----------------+------------+   +---------+--------+--+--------+--+---------+  VertebralPSV cm/s36EDV cm/s14Antegrade  +---------+--------+--+--------+--+---------+     ABI Findings:  +--------+------------------+-----+---------+------------------------------  ----+  Right  Rt Pressure (mmHg)IndexWaveform Comment                +--------+------------------+-----+---------+------------------------------  ----+  Brachial            triphasicUnable to obtain pressure due  to                        bandaging/IV              +--------+------------------+-----+---------+------------------------------  ----+  PTA   152        1.21 triphasic                    +--------+------------------+-----+---------+------------------------------  ----+  DP   156        1.24 triphasic                    +--------+------------------+-----+---------+------------------------------  ----+   +--------+------------------+-----+---------+-------+  Left  Lt Pressure (mmHg)IndexWaveform Comment  +--------+------------------+-----+---------+-------+  [id number]                      +--------+------------------+-----+---------+-------+  PTA   157        1.25 triphasic      +--------+------------------+-----+---------+-------+  DP   158        1.25 triphasic      +--------+------------------+-----+---------+-------+      Summary:  Right Carotid: Velocities in the right ICA are consistent with a 1-39%  stenosis.   Left Carotid: Velocities in the left ICA are consistent with a 80-99%  stenosis.  Vertebrals: Bilateral vertebral arteries demonstrate antegrade flow.  Subclavians: Normal flow hemodynamics were seen in bilateral subclavian         arteries.  *See table(s) above for measurements and observations.  Right ABI: Resting right ankle-brachial index is within normal range. No  evidence of  significant right lower extremity arterial disease.  Left ABI: Resting left ankle-brachial index is within normal range. No  evidence of significant left lower extremity arterial disease.     ASSESSMENT/PLAN:  Asymptomatic Left mid ICA stenosis > 80 % Dr. [name] will discuss the possibility of left CEA and if a CTA is neccessary for further work up in light of his up coming LVAD. Acute/chronic CHF (systolic) IV diuresis  VT on amio now in sinus rhythm AKI Cr decreasing from 2.0 now 1.5 Plan for HM III implant  Mosetta Pigeon 11/24/2020 2:40 PM  I have independently interviewed and examined patient and agree with PA assessment and plan above.  Asymptomatic high-grade left ICA stenosis with pending ventricular assist device.  Obviously if patient is not going to be a candidate for the VAD then would not pursue any further work-up or treatment other than medical therapy only.  If he is to undergo surgery we can do this concomitantly with either transcarotid artery stenting or carotid endarterectomy.  In order to plan stenting would need at least noncontrasted CT preferably contrasted CT angio but in light of his renal insufficiency we may need to go the noncontrast route.  I discussed this with the patient he demonstrates good understanding but also it appears from chart review that he has had some issues with memory.  I will discuss further with the VAD surgeon and team and plan accordingly.  Genella Bas C. [name], MD [facility] Office: [phone] Pager: [phone]

## 2020-11-24 NOTE — Progress Notes (Addendum)
Advanced Heart Failure Rounding Note   Subjective:    Milrinone started 12/25.    ECHO: 12/26 EF 20% with LV thrombus. Severe MR. Moderate RV dysfunction. Now on heparin. No bleeding  RHC 11/23/20 on milrinone 0.25 mcg.  RA = 6 RV = 56/10 PA = 51/22 (33) PCW = 19 Thermal cardiac output/index = 5.5/2.7 Fick CO/CI = 6.2/3.0 PVR = 2.3 WU Ao sat = 94% PA sat = 59%, 61% PAPi = 4.8   Remains on milrinone 0.25  mcg. CO-OX 63%.   Saw Dr Orvan Seen yesterday. Today he says he doesn't know why he met Dr Orvan Seen and what that was for?  Denies SOB. Not hungry. His wife says he eats very little and is occasionally forgetful.   Objective:   Weight Range:  Vital Signs:   Temp:  [97.7 F (36.5 C)-98.8 F (37.1 C)] 98 F (36.7 C) (12/28 0735) Pulse Rate:  [61-71] 69 (12/28 0850) Resp:  [18-22] 18 (12/28 0850) BP: (115-131)/(69-81) 118/69 (12/28 0850) SpO2:  [92 %-100 %] 97 % (12/28 0850) Weight:  [80.6 kg] 80.6 kg (12/28 0431) Last BM Date: 11/22/20  Weight change: Filed Weights   11/22/20 0000 11/23/20 0218 11/24/20 0431  Weight: 82 kg 80.7 kg 80.6 kg    Intake/Output:   Intake/Output Summary (Last 24 hours) at 11/24/2020 0922 Last data filed at 11/24/2020 0842 Gross per 24 hour  Intake 1035.64 ml  Output 2300 ml  Net -1264.36 ml    CVP 5  Physical Exam: General:  No resp difficulty HEENT: normal Neck: supple. no JVD. Carotids 2+ bilat; no bruits. No lymphadenopathy or thryomegaly appreciated. Cor: PMI nondisplaced. Regular rate & rhythm. No rubs, gallops or murmurs. Lungs: clear Abdomen: soft, nontender, nondistended. No hepatosplenomegaly. No bruits or masses. Good bowel sounds. Extremities: no cyanosis, clubbing, rash, edema. RUE PICC  Neuro: alert & orientedx3, cranial nerves grossly intact. moves all 4 extremities w/o difficulty. Affect flat  Telemetry:  NSR 60-70s  Labs: Basic Metabolic Panel: Recent Labs  Lab 11/20/20 0123 11/20/20 0801 11/21/20 0242  11/22/20 0539 11/23/20 0612 11/23/20 0754 11/24/20 0450  NA  --  137 138 138 139 143  144 138  K  --  4.2 4.0 3.2* 4.0 4.1  3.9 4.2  CL  --  104 103 102 106  --  107  CO2  --  _0 --  23  GLUCOSE  --  129* 98 85 93  --  91  BUN  --  27* 27* 23 17  --  11  CREATININE  --  1.82* 1.96* 1.81* 1.53*  --  1.50*  CALCIUM  --  8.6* 8.4* 8.3* 8.2*  --  8.4*  MG 2.0  --  1.9 2.0 1.8  --  2.2    Liver Function Tests: Recent Labs  Lab 11/24/20 0450  AST 40  ALT 127*  ALKPHOS 110  BILITOT 1.5*  PROT 5.5*  ALBUMIN 2.9*   No results for input(s): LIPASE, AMYLASE in the last 168 hours. No results for input(s): AMMONIA in the last 168 hours.  CBC: Recent Labs  Lab 11/17/20 1644 11/19/20 0301 11/23/20 0612 11/23/20 0754 11/24/20 0450  WBC 7.0 7.2 5.8  --  5.3  NEUTROABS  --   --   --   --  3.3  HGB 10.3* 10.6* 9.5* 10.9*  10.2* 9.8*  HCT 33.9* 33.0* 30.2* 32.0*  30.0* 31.0*  MCV 88.5 85.5 84.1  --  85.4  PLT 186 189 232  --  244    Cardiac Enzymes: No results for input(s): CKTOTAL, CKMB, CKMBINDEX, TROPONINI in the last 168 hours.  BNP: BNP (last 3 results) Recent Labs    11/17/20 1645  BNP 3,663.4*    ProBNP (last 3 results) No results for input(s): PROBNP in the last 8760 hours.    Other results:  Imaging: CARDIAC CATHETERIZATION  Result Date: 11/23/2020 Findings: On milrinone 0.375 RA = 6 RV = 56/10 PA = 51/22 (33) PCW = 19 Thermal cardiac output/index = 5.5/2.7 Fick CO/CI = 6.2/3.0 PVR = 2.3 WU Ao sat = 94% PA sat = 59%, 61% PAPi = 4.8 Assessment: 1. Mild pulmonary venous HTN 2. Volume status improved 3. Cardiac output ok on milrinone 4. PAPI acceptable Plan/Discussion: Proceed with LVAD work-up. Glori Bickers, MD 8:14 AM  ECHOCARDIOGRAM LIMITED  Result Date: 11/22/2020    ECHOCARDIOGRAM LIMITED REPORT   Patient Name:   [name] Date of Exam: 11/22/2020 Medical Rec #:  [id number]          Height:       72.0 in Accession #:     [id number]         Weight:       180.8 lb Date of Birth:  [birth year]         BSA:          2.041 m Patient Age:    74 years           BP:           103/67 mmHg Patient Gender: M                  HR:           59 bpm. Exam Location:  Inpatient Procedure: Limited Echo, Cardiac Doppler, Color Doppler and Intracardiac            Opacification Agent REPORT CONTAINS CRITICAL RESULT Indications:    CHF-Acute Systolic  History:        Patient has prior history of Echocardiogram examinations, most                 recent 10/24/2020. CAD and Previous Myocardial Infarction, Prior                 CABG and Defibrillator; Risk Factors:Dyslipidemia. ETOH abuse.  Sonographer:    Clayton Lefort RDCS (AE) Referring Phys: [id number] [place]  1. Large laminated mural apical thrombus. Left ventricular ejection fraction, by estimation, is <20%. The left ventricle has severely decreased function. The left ventricle demonstrates severe global hypokinesis with akinesis of the inferior, inferolateral, inferoseptal and apical walls. The left ventricular internal cavity size was severely dilated.  2. Left atrial size was severely dilated.  3. The posterior MV leaflet is tethered and restricted. . The mitral valve is abnormal. Severe mitral valve regurgitation.  4. The aortic valve is tricuspid. There is mild calcification of the aortic valve. Aortic valve regurgitation is mild.  5. Right ventricular systolic function is moderately reduced. FINDINGS  Left Ventricle: Large laminated mural apical thrombus. Left ventricular ejection fraction, by estimation, is <20%. The left ventricle has severely decreased function. The left ventricle demonstrates global hypokinesis. Definity contrast agent was given IV to delineate the left ventricular endocardial borders. The left ventricular internal cavity size was severely dilated. There is no left ventricular hypertrophy. Right Ventricle: Right ventricular systolic function is moderately  reduced. Left Atrium: Left atrial size was severely  dilated. Pericardium: There is no evidence of pericardial effusion. Mitral Valve: The posterior MV leaflet is tethered and restricted. The mitral valve is abnormal. There is moderate thickening of the mitral valve leaflet(s). Severe mitral valve regurgitation, with posteriorly-directed jet. Tricuspid Valve: The tricuspid valve is grossly normal. Aortic Valve: The aortic valve is tricuspid. There is mild calcification of the aortic valve. Aortic valve regurgitation is mild. Pulmonic Valve: The pulmonic valve was not assessed. Additional Comments: A pacer wire is visualized. LEFT VENTRICLE PLAX 2D LVIDd:         6.10 cm LVIDs:         5.40 cm LV PW:         1.10 cm LV IVS:        0.90 cm  LEFT ATRIUM         Index LA diam:    4.60 cm 2.25 cm/m  MR Peak grad:    121.9 mmHg MR Mean grad:    68.0 mmHg MR Vmax:         552.00 cm/s MR Vmean:        378.0 cm/s MR PISA:         3.08 cm MR PISA Eff ROA: 21 mm MR PISA Radius:  0.70 cm Glori Bickers MD Electronically signed by Glori Bickers MD Signature Date/Time: 11/22/2020/8:31:45 PM    Final    CT CHEST ABDOMEN PELVIS WO CONTRAST  Result Date: 11/23/2020 CLINICAL DATA:  Presurgical evaluation for LVAD EXAM: CT CHEST, ABDOMEN AND PELVIS WITHOUT CONTRAST TECHNIQUE: Multidetector CT imaging of the chest, abdomen and pelvis was performed following the standard protocol without IV contrast. COMPARISON:  CT angio chest, abdomen, pelvis 12/21/2015. FINDINGS: Lines and tubes: Right PICC with tip terminating at the superior cavoatrial junction. Two lead left chest wall cardiac pacemaker/defibrillator with leads terminating in the event right ventricle and right atria. CT CHEST FINDINGS Cardiovascular: Surgical changes related to a coronary artery bypass graft. Left atrial and ventricular enlargement. No significant pericardial effusion. The thoracic aorta is normal in caliber. No atherosclerotic plaque of the thoracic  aorta. At least mild to moderate three-vessel coronary artery calcifications. Coronary artery calcifications. Mediastinum/Nodes: No enlarged mediastinal, hilar, or axillary lymph nodes. Thyroid gland, trachea, and esophagus demonstrate no significant findings. At least small volume hiatal hernia. Lungs/Pleura: No focal consolidation. Left apical atelectasis versus scarring again noted. No pulmonary nodule or mass. Bilateral trace, left greater than right, pleural effusions. No pneumothorax. Musculoskeletal: No chest wall abnormality. No suspicious lytic or blastic osseous lesions. No acute displaced fracture. Multilevel degenerative changes of the spine. Status post right shoulder rotator cuff anchor suture repair. CT ABDOMEN PELVIS FINDINGS Hepatobiliary: No focal liver abnormality is seen. Layering hyperdensity within the gallbladder lumen consistent with gallstones. No associated gallbladder wall thickening or pericholecystic fluid. No biliary dilatation. Pancreas: No focal lesion. Normal pancreatic contour. No surrounding inflammatory changes. No main pancreatic ductal dilatation. Spleen: Normal in size without focal abnormality. A splenule is identified. Adrenals/Urinary Tract: No adrenal nodule bilaterally. Nonspecific bilateral perinephric stranding. No nephrolithiasis, no hydronephrosis, and no contour-deforming renal mass. No ureterolithiasis or hydroureter. The urinary bladder is unremarkable. Stomach/Bowel: Stomach is within normal limits. No evidence of bowel wall thickening or dilatation. Suggestion of a second portion of the duodenum diverticula. Diffuse sigmoid diverticulosis. Mobile cecum. The appendix not visualized. Vascular/Lymphatic: No abdominal aorta or iliac aneurysm. At least moderate atherosclerotic plaque of the aorta and its branches. No abdominal, pelvic, or inguinal lymphadenopathy. Reproductive: Metallic density surrounding the prostate likely  represent radiation therapy seeds.  Otherwise the prostate is unremarkable. Other: Nonspecific mild presacral stranding that is likely related to radiation treatment of the prostate. No intraperitoneal free fluid. No intraperitoneal free gas. No organized fluid collection. Musculoskeletal: Anterior abdominal wall subcutaneus soft tissue densities and couple foci of gas likely related to medicine injections. Tiny fat containing umbilical hernia. No suspicious lytic or blastic osseous lesions. No acute displaced fracture. Multilevel degenerative changes of the spine. IMPRESSION: 1. Please note limited evaluation on this noncontrast study. 2. Bilateral trace pleural effusions. 3. Cholelithiasis with no CT findings to suggest acute cholecystitis or choledocholithiasis. 4. Diffuse sigmoid diverticulosis with no acute diverticulitis. 5. At least small volume hiatal hernia. 6. Anterior abdominal wall subcutaneus soft tissue densities and couple foci of gas likely related to medicine injections. Please correlate with physical exam and history. 7. 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 8. Aortic Atherosclerosis (ICD10-I70.0) as well as at least mild to moderate three-vessel coronary artery calcifications. Electronically Signed   By: Iven Finn M.D.   On: 11/23/2020 18:14     Medications:     Scheduled Medications: . amiodarone  400 mg Oral BID  . aspirin EC  81 mg Oral Daily  . atorvastatin  80 mg Oral QPM  . buPROPion  300 mg Oral Daily  . Chlorhexidine Gluconate Cloth  6 each Topical Daily  . escitalopram  20 mg Oral Daily  . mexiletine  150 mg Oral Q8H  . multivitamin  1 tablet Oral Daily  . pantoprazole  40 mg Oral QAC breakfast  . potassium chloride  20 mEq Oral Daily  . sodium chloride flush  10-40 mL Intracatheter Q12H  . sodium chloride flush  3 mL Intravenous Q12H  . zolpidem  5 mg Oral QHS    Infusions: . heparin 1,250 Units/hr (11/24/20 0842)  . 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 Severe MR. Moderate RV dysfunction. Now on heparin. No bleeding - NYHA IV.  Low output physiology confirmed Co-ox (12/25) 44%. - RHC 11/24/20 on RA 6, PCWP 19, CO 6.2/3.0  - Milrinone started on 12/25. Cath was on milrinone 0.25 mcg with stable hemodynamcis on cath.  - Todays CO-OX  61%  - Off b-blocker with shock - Off Entresto with worsening renal function.  - Consider losartan tomorrow.  - Seems to be good candidate. RV has moderate dysfunction but CVP low.  -CT surgery consulted. VAD coordinators following.  - Will need to watch for memory issues. Seems forgetful today.   2.Monomorphic ventricular tachycardia -No further recurrence. ICD VT zones have been adjusted. Continue amiodarone.  - Keep K> 4.0 Mg > 2.0 -Recent left heart catheterization on 10/23/2020 without any targets for revascularization - No VT overnight.  Stable so far  3.CAD status post CABG -Recent left heart cath with severe three-vessel CAD. -He has patent vein grafts. (LIMA -> LAD, SVG-> RCA, SVG -> OM2 -> OM3) -No s/s angina -Continue aspirin and Lipitor  4.Severe mitral regurgitation - pending VAD  5. AKI  - due to cardiorenal/ATN - baseline 1.1 - 1.3-> 1.7 -> 1.8 -> 2.0 -> 1.8 ->1.5  - improving with hemodynamic support  6. Hypokalemia -  K stable.   7. Apical LV mural thrombus - heparin started.   8. Hypoalbuminemia.  Prealbumin 13 .  Consult dietitian   Consult PT/Nutrition today.   Length of Stay: [place] NP-C  11/24/2020, 9:22 AM  Advanced Heart Failure Team Pager [phone] (M-F; 7a - 4p)  Please contact [facility] for night-coverage after hours (4p -7a ) and weekends on [email or web address]  Patient seen with NP, agree with the above note.   Stable today, co-ox 60% with CVP 5 on milrinone 0.25.  He is not on a diuretic currently.   On heparin gtt with LV thrombus.   Has some issues with memory, able to tell me that he needs a surgery but regarding details says to talk to his wife.  Prealbumin 13.   General: NAD Neck: No JVD, no thyromegaly or thyroid nodule.  Lungs: Clear to auscultation bilaterally with normal respiratory effort. CV: Nondisplaced PMI.  Heart regular S1/S2, no S3/S4, 2/6 HSM apex.  No peripheral edema.   Abdomen: Soft, nontender, no hepatosplenomegaly, no distention.  Skin: Intact without lesions or rashes.  Neurologic: Alert and oriented x 3.  Psych: Normal affect. Extremities: No clubbing or cyanosis.  HEENT: Normal.   Patient is inotrope dependent, continue milrinone 0.25.  Does not need Lasix currently, continue to follow CVP.   Continue anticoagulation for LV thrombus.   Extensive CAD, no interventional option.   Per wife, memory issues are new.  Will get CT of head.  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  VVS to see.   Working up for LVAD, need to get PT/OT evaluation.  Needs nutrition evaluation and supplementation.  Needs evaluation of mental status/memory.   Loralie Champagne 11/24/2020 3:25 PM

## 2020-11-24 NOTE — Progress Notes (Signed)
Initial Nutrition Assessment  DOCUMENTATION CODES:   Not applicable  INTERVENTION:   -MVI with minerals daily -Ensure Enlive po BID, each supplement provides 350 kcal and 20 grams of protein -Magic cup TID with meals, each supplement provides 290 kcal and 9 grams of protein  NUTRITION DIAGNOSIS:   Increased nutrient needs related to chronic illness (CHF) as evidenced by estimated needs.  GOAL:   Patient will meet greater than or equal to 90% of their needs  MONITOR:   PO intake,Supplement acceptance,Weight trends,Skin,I & O's  REASON FOR ASSESSMENT:   Consult Assessment of nutrition requirement/status,LVAD Eval  ASSESSMENT:   [name] is a 74 y.o. male with medical history significant of CAD status post CABG and PCI, chronic combined systolic and diastolic CHF/ischemic cardiomyopathy with LVEF 20-25% and history of VT status post ICD, hypertension, hyperlipidemia, CKD stage IIIa, history of prostate and tonsillar cancer, history of DVT presenting to the ED with complaints of shortness of breath.  Patient reports dyspnea on exertion and cough for the past 3 to 4 days.  He was having low-grade fevers earlier but has not had them for the past few days.  He has also had 3 episodes of vomiting over the past week which has now resolved.  Denies abdominal pain or diarrhea.  States his ICD had shocked him prior to his recent hospitalization but he has not had any issues lately.  He is vaccinated against Covid.  States he has been checked for Covid multiple times recently and every time results came back negative.  Pt admitted with acute on chronic CHF.   12/27- s/p rt heart cath  Reviewed I/O's: -1.1 L x 24 hours and -2.2 L since admission  UOP: 1.9 L x 24 hours  Attempted to speak with pt via call to hospital room phone, however, unable to reach. RD unable to obtain further nutriton-related history or complete nutrition-focused physical exam at this time.  Per notes, pt  wife reports pt is very forgetful and often eats very little. Reviewed meal completion records; PO 50-100%.   Per chart review, pt complains of legs feeling very weak. PT evaluation pending.   Reviewed wt hx; pt has experienced a 2.9% wt loss over the past month, which is not significant for time frame.   Pt with increased nutrient needs related to chronic illness and would greatly benefit from addition of oral nutrition supplements to optimize nutritional status pre-operatively.Pt and wife are interested in pursuing LVAD work-up. Pt is at high risk for malnutrition, however, unable to identify at this time.   Medications reviewed and include ambien and milrinone.   Albumin has a half-life of 21 days and is strongly affected by stress response and inflammatory process, therefore, do not expect to see an improvement in this lab value during acute hospitalization. When a patient presents with low albumin, it is likely skewed due to the acute inflammatory response.  Unless it is suspected that patient had poor PO intake or malnutrition prior to admission, then RD should not be consulted solely for low albumin. Note that low albumin is no longer used to diagnose malnutrition; [facility] uses the new malnutrition guidelines published by the American Society for Parenteral and Enteral Nutrition (A.S.P.E.N.) and the Academy of Nutrition and Dietetics (AND).    Labs reviewed.   Diet Order:   Diet Order            Diet 2 gram sodium Room service appropriate? Yes; Fluid consistency: Thin; Fluid restriction:  1500 mL Fluid  Diet effective now                 EDUCATION NEEDS:   No education needs have been identified at this time  Skin:  Skin Assessment: Reviewed RN Assessment  Last BM:  11/22/20  Height:   Ht Readings from Last 1 Encounters:  11/17/20 6' (1.829 m)    Weight:   Wt Readings from Last 1 Encounters:  11/24/20 80.6 kg    Ideal Body Weight:  80.9 kg  BMI:  Body mass  index is 24.1 kg/m.  Estimated Nutritional Needs:   Kcal:  2400-2600  Protein:  140-160 grams  Fluid:  1.5 L    Loistine Chance, RD, LDN, [place] Registered Dietitian II Certified Diabetes Care and Education Specialist Please refer to [facility] for RD and/or RD on-call/weekend/after hours pager

## 2020-11-24 NOTE — Progress Notes (Signed)
Pre-VAD ultrasound and lower extremity venous bilateral studies completed.  Preliminary results relayed to Jacqulyn Bath, MD.   See CV Proc for preliminary results report.   Jean Rosenthal, RDMS

## 2020-11-24 NOTE — Progress Notes (Addendum)
PROGRESS NOTE    [name]  [id number] DOB: [birth year] DOA: 11/17/2020 PCP: Hoyt Koch, MD   Brief Narrative:  [name] is a 74 y.o. male with medical history significant of CAD status post CABG and PCI, chronic combined systolic and diastolic CHF/ischemic cardiomyopathy with LVEF 20-25% and history of VT status post ICD, hypertension, hyperlipidemia, CKD stage IIIa, history of prostate and tonsillar cancer, history of DVT presenting to the ED with complaints of shortness of breath.  Patient reports dyspnea on exertion and cough for the past 3 to 4 days.  He was having low-grade fevers earlier but has not had them for the past few days.  He has also had 3 episodes of vomiting over the past week which has now resolved.  Denies abdominal pain or diarrhea.  States his ICD had shocked him prior to his recent hospitalization but he has not had any issues lately.  He is vaccinated against Covid.    ED Course: Afebrile.  Tachypneic with respiratory rate in the 20s.  Not tachycardic.  Not hypoxic.  WBC 7.0, hemoglobin 10.3, hematocrit 33.9, platelet 186K.  Sodium 140, potassium 3.7, chloride 107, bicarb 22, BUN 27, creatinine 1.7, glucose 106.  High-sensitivity troponin mildly elevated but stable (29 >29).  BNP significantly elevated at 3663.  SARS-CoV-2 PCR test and influenza panel both negative.  Chest x-ray showing cardiomegaly and small effusions in the posterior costophrenic angles; no frank edema.Patient was given IV Lasix 20 mg.  Assessment & Plan:  Acute on chronic combined systolic and diastolic CHF: -Patient presented with signs of fluid overload.  Chest x-ray shows cardiomegaly and small effusion in the posterior costophrenic angles.  -On admission: Has bibasilar Rales, JVD and significantly elevated BNP of 3663. -Echo from 10/24/2020 showing severely reduced ejection fraction of 20 to [id number], grade 2 diastolic dysfunction, moderate to severe MR. -Status post  RHC on 11/23/2020-results reviewed. -Lasix, Entresto and Coreg was discontinued  -Continue milrinone-tarted on 12/25 -Strict INO's and daily weight.  Monitor signs of fluid overload.  Monitor electrolytes closely. -Keep potassium more than 4, magnesium more than 2.   -CT surgery consulted.  VAD coordinators following.  Monomorphic ventricular tachycardia: -Was given loading dose of amiodarone and then was placed on GGT -Continue on amiodarone 400 mg twice daily and mexiletine.  -Appreciate cardiology and EP team assistance -Recent left heart cath on 10/23/2020 without any targets for revascularization.  LV thrombus: Noted on echo on 12/26. -Shows 20% ejection fraction with LV thrombus.  Severe MR.  Moderate RV dysfunction. -Continue IV heparin as per pharmacy.  AKI on CKD stage IIIa: -Kidney function improving -Entresto and Lasix was discontinued by cardiology. -Continue to monitor kidney function closely. -Repeat BMP tomorrow a.m.  Severe MR: -Status post RHC on 12/27. -Appreciate cardiology assistance.  History of coronary artery disease status post CABG and PCI:  -Patient denies ACS symptoms.  Troponin mildly elevated but stable 29x2.  Suspected demand ischemia from decompensated CHF. -Continue aspirin, and statin  Hypokalemia: Replenished.  Magnesium level: WNL.  Repeat BMP tomorrow a.m.  Hypertension: Stable.  Continue Coreg and Entresto. Monitor blood pressure closely.  Hyperlipidemia: Continue Lipitor  GERD: Continue PPI  Hypokalemia: Resolved.  Continue potassium supplement to keep potassium above 4.  Hypomagnesemia: Resolved  Depression/anxiety: Continue Lexapro, Wellbutrin and Ambien  Hypoalbuminemia: Prealbumin: 13. -Dietitian consulted.  Addendum: Pre VAD work-up including-carotid Doppler ultrasound-showed left ICA stenosis 80 to 99%. We will consult vascular surgery for further management.  DVT prophylaxis: Heparin  Code Status: None Family  Communication: None present at bedside.  Plan of care discussed with patient in length and he verbalized understanding and agreed with it. Disposition Plan: Likely home in 1 to 2 days  Consultants:   Cardiology  Procedures:  Echo Right heart cath  Antimicrobials:   None  Status is: Inpatient   Dispo: The patient is from: Home              Anticipated d/c is to: Home              Anticipated d/c date is: 2 days              Patient currently is not medically stable to d/c.   Subjective: Patient seen and examined.  No new complaints.  No acute events overnight.  Tells me that he is 90% sure about VAD and has still lots of questions to ask.  He denies chest pain, shortness of breath, palpitation, leg swelling, orthopnea or PND.   Objective: Vitals:   11/24/20 0425 11/24/20 0431 11/24/20 0735 11/24/20 0850  BP: 131/81  126/78 118/69  Pulse: 66  62 69  Resp: 20  19 18   Temp: 98.5 F (36.9 C)  98 F (36.7 C)   TempSrc: Oral  Oral   SpO2: 97%  95% 97%  Weight:  80.6 kg    Height:        Intake/Output Summary (Last 24 hours) at 11/24/2020 1001 Last data filed at 11/24/2020 0842 Gross per 24 hour  Intake 1035.64 ml  Output 2300 ml  Net -1264.36 ml   Filed Weights   11/22/20 0000 11/23/20 0218 11/24/20 0431  Weight: 82 kg 80.7 kg 80.6 kg    Examination:  General exam: Appears calm and comfortable, on room air, elderly,  communicating well Respiratory system: Clear to auscultation. Respiratory effort normal. Cardiovascular system: S1 & S2 heard, RRR. No JVD, murmurs, rubs, gallops or clicks. No pedal edema. Gastrointestinal system: Abdomen is nondistended, soft and nontender. No organomegaly or masses felt. Normal bowel sounds heard. Central nervous system: Alert and oriented. No focal neurological deficits. Extremities: Symmetric 5 x 5 power. Skin: No rashes, lesions or ulcers Psychiatry: Judgement and insight appear normal. Mood & affect appropriate.    Data  Reviewed: I have personally reviewed following labs and imaging studies  CBC: Recent Labs  Lab 11/17/20 1644 11/19/20 0301 11/23/20 0612 11/23/20 0754 11/24/20 0450  WBC 7.0 7.2 5.8  --  5.3  NEUTROABS  --   --   --   --  3.3  HGB 10.3* 10.6* 9.5* 10.9*  10.2* 9.8*  HCT 33.9* 33.0* 30.2* 32.0*  30.0* 31.0*  MCV 88.5 85.5 84.1  --  85.4  PLT 186 189 232  --  [id number]   Basic Metabolic Panel: Recent Labs  Lab 11/20/20 0123 11/20/20 0801 11/21/20 0242 11/22/20 0539 11/23/20 0612 11/23/20 0754 11/24/20 0450  NA  --  137 138 138 139 143  144 138  K  --  4.2 4.0 3.2* 4.0 4.1  3.9 4.2  CL  --  104 103 102 106  --  107  CO2  --  22 23 25 23   --  23  GLUCOSE  --  129* 98 85 93  --  91  BUN  --  27* 27* 23 17  --  11  CREATININE  --  1.82* 1.96* 1.81* 1.53*  --  1.50*  CALCIUM  --  8.6* 8.4* 8.3* 8.2*  --  8.4*  MG 2.0  --  1.9 2.0 1.8  --  2.2   GFR: Estimated Creatinine Clearance: 48.1 mL/min (A) (by C-G formula based on SCr of 1.5 mg/dL (H)). Liver Function Tests: Recent Labs  Lab 11/24/20 0450  AST 40  ALT 127*  ALKPHOS 110  BILITOT 1.5*  PROT 5.5*  ALBUMIN 2.9*   No results for input(s): LIPASE, AMYLASE in the last 168 hours. No results for input(s): AMMONIA in the last 168 hours. Coagulation Profile: Recent Labs  Lab 11/23/20 1622  INR 1.1   Cardiac Enzymes: No results for input(s): CKTOTAL, CKMB, CKMBINDEX, TROPONINI in the last 168 hours. BNP (last 3 results) No results for input(s): PROBNP in the last 8760 hours. HbA1C: Recent Labs    11/23/20 0500  HGBA1C 5.7*   CBG: No results for input(s): GLUCAP in the last 168 hours. Lipid Profile: No results for input(s): CHOL, HDL, LDLCALC, TRIG, CHOLHDL, LDLDIRECT in the last 72 hours. Thyroid Function Tests: No results for input(s): TSH, T4TOTAL, FREET4, T3FREE, THYROIDAB in the last 72 hours. Anemia Panel: No results for input(s): VITAMINB12, FOLATE, FERRITIN, TIBC, IRON, RETICCTPCT in the last 72  hours. Sepsis Labs: Recent Labs  Lab 11/18/20 1254 11/19/20 0301 11/20/20 0123  PROCALCITON 0.10 <0.10 0.13    Recent Results (from the past 240 hour(s))  Resp Panel by RT-PCR (Flu A&B, Covid) Nasopharyngeal Swab     Status: None   Collection Time: 11/17/20 10:35 PM   Specimen: Nasopharyngeal Swab; Nasopharyngeal(NP) swabs in vial transport medium  Result Value Ref Range Status   SARS Coronavirus 2 by RT PCR NEGATIVE NEGATIVE Final    Comment: (NOTE) SARS-CoV-2 target nucleic acids are NOT DETECTED.  The SARS-CoV-2 RNA is generally detectable in upper respiratory specimens during the acute phase of infection. The lowest concentration of SARS-CoV-2 viral copies this assay can detect is 138 copies/mL. A negative result does not preclude SARS-Cov-2 infection and should not be used as the sole basis for treatment or other patient management decisions. A negative result may occur with  improper specimen collection/handling, submission of specimen other than nasopharyngeal swab, presence of viral mutation(s) within the areas targeted by this assay, and inadequate number of viral copies(<138 copies/mL). A negative result must be combined with clinical observations, patient history, and epidemiological information. The expected result is Negative.  Fact Sheet for Patients:  BloggerCourse.com  Fact Sheet for Healthcare Providers:  SeriousBroker.it  This test is no t yet approved or cleared by the [place] FDA and  has been authorized for detection and/or diagnosis of SARS-CoV-2 by FDA under an Emergency Use Authorization (EUA). This EUA will remain  in effect (meaning this test can be used) for the duration of the COVID-19 declaration under Section 564(b)(1) of the Act, 21 U.S.C.section 360bbb-3(b)(1), unless the authorization is terminated  or revoked sooner.       Influenza A by PCR NEGATIVE NEGATIVE Final   Influenza B  by PCR NEGATIVE NEGATIVE Final    Comment: (NOTE) The Xpert Xpress SARS-CoV-2/FLU/RSV plus assay is intended as an aid in the diagnosis of influenza from Nasopharyngeal swab specimens and should not be used as a sole basis for treatment. Nasal washings and aspirates are unacceptable for Xpert Xpress SARS-CoV-2/FLU/RSV testing.  Fact Sheet for Patients: BloggerCourse.com  Fact Sheet for Healthcare Providers: SeriousBroker.it  This test is not yet approved or cleared by the [place] FDA and has been authorized for detection and/or diagnosis of SARS-CoV-2 by FDA under an Emergency Use  Authorization (EUA). This EUA will remain in effect (meaning this test can be used) for the duration of the COVID-19 declaration under Section 564(b)(1) of the Act, 21 U.S.C. section 360bbb-3(b)(1), unless the authorization is terminated or revoked.  Performed at [facility], [place] [address] [place], [place] [address]       Radiology Studies: CARDIAC CATHETERIZATION  Result Date: 11/23/2020 Findings: On milrinone 0.375 RA = 6 RV = 56/10 PA = 51/22 (33) PCW = 19 Thermal cardiac output/index = 5.5/2.7 Fick CO/CI = 6.2/3.0 PVR = 2.3 WU Ao sat = 94% PA sat = 59%, 61% PAPi = 4.8 Assessment: 1. Mild pulmonary venous HTN 2. Volume status improved 3. Cardiac output ok on milrinone 4. PAPI acceptable Plan/Discussion: Proceed with LVAD work-up. Glori Bickers, MD 8:14 AM  ECHOCARDIOGRAM LIMITED  Result Date: 11/22/2020    ECHOCARDIOGRAM LIMITED REPORT   Patient Name:   [name] Date of Exam: 11/22/2020 Medical Rec #:  [id number]          Height:       72.0 in Accession #:    [id number]         Weight:       180.8 lb Date of Birth:  05-May-1946         BSA:          2.041 m Patient Age:    52 years           BP:           103/67 mmHg Patient Gender: M                  HR:           59 bpm. Exam Location:  Inpatient Procedure: Limited Echo,  Cardiac Doppler, Color Doppler and Intracardiac            Opacification Agent REPORT CONTAINS CRITICAL RESULT Indications:    CHF-Acute Systolic  History:        Patient has prior history of Echocardiogram examinations, most                 recent 10/24/2020. CAD and Previous Myocardial Infarction, Prior                 CABG and Defibrillator; Risk Factors:Dyslipidemia. ETOH abuse.  Sonographer:    Clayton Lefort RDCS (AE) Referring Phys: [id number] [place]  1. Large laminated mural apical thrombus. Left ventricular ejection fraction, by estimation, is <20%. The left ventricle has severely decreased function. The left ventricle demonstrates severe global hypokinesis with akinesis of the inferior, inferolateral, inferoseptal and apical walls. The left ventricular internal cavity size was severely dilated.  2. Left atrial size was severely dilated.  3. The posterior MV leaflet is tethered and restricted. . The mitral valve is abnormal. Severe mitral valve regurgitation.  4. The aortic valve is tricuspid. There is mild calcification of the aortic valve. Aortic valve regurgitation is mild.  5. Right ventricular systolic function is moderately reduced. FINDINGS  Left Ventricle: Large laminated mural apical thrombus. Left ventricular ejection fraction, by estimation, is <20%. The left ventricle has severely decreased function. The left ventricle demonstrates global hypokinesis. Definity contrast agent was given IV to delineate the left ventricular endocardial borders. The left ventricular internal cavity size was severely dilated. There is no left ventricular hypertrophy. Right Ventricle: Right ventricular systolic function is moderately reduced. Left Atrium: Left atrial size was severely dilated. Pericardium: There is no evidence of  pericardial effusion. Mitral Valve: The posterior MV leaflet is tethered and restricted. The mitral valve is abnormal. There is moderate thickening of the mitral valve  leaflet(s). Severe mitral valve regurgitation, with posteriorly-directed jet. Tricuspid Valve: The tricuspid valve is grossly normal. Aortic Valve: The aortic valve is tricuspid. There is mild calcification of the aortic valve. Aortic valve regurgitation is mild. Pulmonic Valve: The pulmonic valve was not assessed. Additional Comments: A pacer wire is visualized. LEFT VENTRICLE PLAX 2D LVIDd:         6.10 cm LVIDs:         5.40 cm LV PW:         1.10 cm LV IVS:        0.90 cm  LEFT ATRIUM         Index LA diam:    4.60 cm 2.25 cm/m  MR Peak grad:    121.9 mmHg MR Mean grad:    68.0 mmHg MR Vmax:         552.00 cm/s MR Vmean:        378.0 cm/s MR PISA:         3.08 cm MR PISA Eff ROA: 21 mm MR PISA Radius:  0.70 cm Arvilla Meres MD Electronically signed by Arvilla Meres MD Signature Date/Time: 11/22/2020/8:31:45 PM    Final    CT CHEST ABDOMEN PELVIS WO CONTRAST  Result Date: 11/23/2020 CLINICAL DATA:  Presurgical evaluation for LVAD EXAM: CT CHEST, ABDOMEN AND PELVIS WITHOUT CONTRAST TECHNIQUE: Multidetector CT imaging of the chest, abdomen and pelvis was performed following the standard protocol without IV contrast. COMPARISON:  CT angio chest, abdomen, pelvis 12/21/2015. FINDINGS: Lines and tubes: Right PICC with tip terminating at the superior cavoatrial junction. Two lead left chest wall cardiac pacemaker/defibrillator with leads terminating in the event right ventricle and right atria. CT CHEST FINDINGS Cardiovascular: Surgical changes related to a coronary artery bypass graft. Left atrial and ventricular enlargement. No significant pericardial effusion. The thoracic aorta is normal in caliber. No atherosclerotic plaque of the thoracic aorta. At least mild to moderate three-vessel coronary artery calcifications. Coronary artery calcifications. Mediastinum/Nodes: No enlarged mediastinal, hilar, or axillary lymph nodes. Thyroid gland, trachea, and esophagus demonstrate no significant findings. At  least small volume hiatal hernia. Lungs/Pleura: No focal consolidation. Left apical atelectasis versus scarring again noted. No pulmonary nodule or mass. Bilateral trace, left greater than right, pleural effusions. No pneumothorax. Musculoskeletal: No chest wall abnormality. No suspicious lytic or blastic osseous lesions. No acute displaced fracture. Multilevel degenerative changes of the spine. Status post right shoulder rotator cuff anchor suture repair. CT ABDOMEN PELVIS FINDINGS Hepatobiliary: No focal liver abnormality is seen. Layering hyperdensity within the gallbladder lumen consistent with gallstones. No associated gallbladder wall thickening or pericholecystic fluid. No biliary dilatation. Pancreas: No focal lesion. Normal pancreatic contour. No surrounding inflammatory changes. No main pancreatic ductal dilatation. Spleen: Normal in size without focal abnormality. A splenule is identified. Adrenals/Urinary Tract: No adrenal nodule bilaterally. Nonspecific bilateral perinephric stranding. No nephrolithiasis, no hydronephrosis, and no contour-deforming renal mass. No ureterolithiasis or hydroureter. The urinary bladder is unremarkable. Stomach/Bowel: Stomach is within normal limits. No evidence of bowel wall thickening or dilatation. Suggestion of a second portion of the duodenum diverticula. Diffuse sigmoid diverticulosis. Mobile cecum. The appendix not visualized. Vascular/Lymphatic: No abdominal aorta or iliac aneurysm. At least moderate atherosclerotic plaque of the aorta and its branches. No abdominal, pelvic, or inguinal lymphadenopathy. Reproductive: Metallic density surrounding the prostate likely represent radiation therapy seeds. Otherwise the  prostate is unremarkable. Other: Nonspecific mild presacral stranding that is likely related to radiation treatment of the prostate. No intraperitoneal free fluid. No intraperitoneal free gas. No organized fluid collection. Musculoskeletal: Anterior  abdominal wall subcutaneus soft tissue densities and couple foci of gas likely related to medicine injections. Tiny fat containing umbilical hernia. No suspicious lytic or blastic osseous lesions. No acute displaced fracture. Multilevel degenerative changes of the spine. IMPRESSION: 1. Please note limited evaluation on this noncontrast study. 2. Bilateral trace pleural effusions. 3. Cholelithiasis with no CT findings to suggest acute cholecystitis or choledocholithiasis. 4. Diffuse sigmoid diverticulosis with no acute diverticulitis. 5. At least small volume hiatal hernia. 6. Anterior abdominal wall subcutaneus soft tissue densities and couple foci of gas likely related to medicine injections. Please correlate with physical exam and history. 7. 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 8. Aortic Atherosclerosis (ICD10-I70.0) as well as at least mild to moderate three-vessel coronary artery calcifications. Electronically Signed   By: Iven Finn M.D.   On: 11/23/2020 18:14    Scheduled Meds: . amiodarone  400 mg Oral BID  . aspirin EC  81 mg Oral Daily  . atorvastatin  80 mg Oral QPM  . buPROPion  300 mg Oral Daily  . Chlorhexidine Gluconate Cloth  6 each Topical Daily  . escitalopram  20 mg Oral Daily  . mexiletine  150 mg Oral Q8H  . multivitamin  1 tablet Oral Daily  . pantoprazole  40 mg Oral QAC breakfast  . sodium chloride flush  10-40 mL Intracatheter Q12H  . sodium chloride flush  3 mL Intravenous Q12H  . zolpidem  5 mg Oral QHS   Continuous Infusions: . heparin 1,250 Units/hr (11/24/20 0842)  . milrinone 0.25 mcg/kg/min (11/24/20 0840)     LOS: 7 days   Time spent: 40 minutes  Trenell Concannon Loann Quill, MD [facility]  If 7PM-7AM, please contact night-coverage [email or web address] 11/24/2020, 10:01 AM

## 2020-11-24 NOTE — Progress Notes (Signed)
VAD Coordinator met with patient and wife and answered a few more questions re: HM III implant and what to expect afterward.  VAD patient introduced to [name] and his wife. Discussion between current VAD patient, [name], and Rickie re: life with LVAD, implant, post implant, recovery, limitations, lifestyle changes, and "better life" with decreased heart failure symptoms. Patient and caregivers asked appropriate questions, had good interaction with Jimmy, and verbalized understanding of above.   No further questions at this time.  Zada Girt RN, [place] Coordinator [phone]

## 2020-11-24 NOTE — Progress Notes (Signed)
LVAD Initial Psychosocial Screening  Date/Time Initiated:  11/24/20 initiated at 10am Referral Source:  LVAD TEAM Referral Reason:  LVAD work up Source of Information:  Patient and patient wife  Demographics Name:  [name] Address: [address], [place], [place] [address] Home phone:  [phone] (home)    Cell: [phone] (wife cell) Marital Status: Married  Faith: Catholic Primary Language: English SS: [id number]  DOB: 11-24-1946   Medical & Follow-up Adherence to Medical regimen/INR checks: compliant  Medication adherence: compliant  Physician/Clinic Appointment Attendance: compliant Comments: no reported concerns with medical compliance by patient or wife   Advance Directives: Do you have a Living Will or Medical POA? No  Would you like to complete a Living Will and Medical POA prior to surgery?  Yes Do you have Goals of Care? Yes  Have you had a consult with the Palliative Care Team at [facility]? Yes  Psychological Health Appearance:  In hospital gown Mental Status:  Alert, oriented x4 when questioned though some concerns with short term memory- was unable to remember seeing palliative NP who had spoken with pt right before CSW Eye Contact:  Good Thought Content:  Coherent Speech:  Logical/coherent Mood:  Other (Comment) overall appropriate mood displayed though was negative at time  Affect:  Appropriate to circumstance  Insight:  Fair Judgement: Other (Comment) unclear level of judgement at this time- answered all questions appropriately but some concerns with memory- wife states he is not at baseline Interaction Style:  Other (Comment) answers questions willingly but does not elaborate naturally- requires prompting for further explanation  Family/Social Information Who lives in your home? Name: [name]   Relationship: wife   Other family members/support persons in your life? Name: [name], [name]  Relationship:  friends    Caregiving Needs Who is the primary caregiver? [name] status:  Good- had recent knee surgery in October but has recovered for the most part- has to use cane sometimes  Do you drive?  yes Do you work?  [name] is retired but does still participate with dog training Physical Limitations:  Does not feel as if she has any major limitations- would not be able to provide total support but if pt mainly needing moderate assist feels capable Do you have other care giving responsibilities?  no Contact [phone]  Who is the secondary caregiver? Not identified at this time- wife to discuss with several friends.   Home Environment/Personal Care Do you have reliable phone service? Yes  If so, what is the number?  [phone] or [phone] Do you own or rent your home? Owns Current mortgage/rent: around $830/month Number of steps into the home? Thinks there are 3 stairs to get into the front of the house and about 5 in the garage How many levels in the home? Live in a one story home  Assistive devices in the home? None currently Electrical needs for LVAD (3 prong outlets)? Yes has 3 prong outlets Second hand smoke exposure in the home? Neither pt or wife report tobacco use Travel distance from [facility]? About 15-20 minutes  Community Are you active with community agencies/resources/homecare? No Agency Name: n/a Are you active in a church, synagogue, mosque or other faith based community? No Faith based institutions name: n/a- pt identifies at [employer] and has requested a priest visit while in the hospital but does not have an interest in going to church because he does not want to interact with the other people What other  sources do you have for spiritual support? none Are you active in any clubs or social organizations? no What do you do for fun?  Hobbies?  Interests? States that all he does at this time is watch tv and doesn't engage in other  activities due to physical limitations and motivation mentally  Education/Work Information What is the last grade of school you completed? High school Preferred method of learning?  Hands on Do you have any problems with reading or writing?  No Are you currently employed?  No  When were you last employed? 2002  Name of employer? Worked for [employer] and [employer]  Please describe the kind of work you do? Worked with computers drawing up engineering plans  How long have you worked there? Worked a total of 31 years for [employer] then went to [employer] where he worked for 4 years If you are not working, do Social worker to return to work after VAD surgery? No If yes, what type of employment do you hope to find? n/a Are you interested in job training or learning new skills?  No Did you serve in the [place]? Yes  If so, what branch? 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 What is your source of income? Both pt and wife receive SSRI, wife also gets a pension Do you have difficulty meeting your monthly expenses? No If yes, which ones? n/a How do you cope with this? n/a Can you budget for the monthly cost for dressing supplies post procedure? Yes  Primary Health insurance:  [place] Medicare Secondary Insurance: thinks he gets secondary insurance through [employer]- none on file Prescription plan: BCBS Medicare What are your prescription co-pays? Less than $50/month for all medications Do you use mail order for your prescriptions?  No Have you ever had to refuse medication due to cost?  No Have you applied for Medicaid?  no Have you applied for Social Security Disability (SSI)  no  Medical Information Briefly describe why you are here for evaluation: "My heart is not working well and they want to put a pump in" Do you have a PCP or other medical provider? Dr. Sharlet Salina Are you able to complete your ADL's?  yes Do you have a history of trauma, physical, emotional, or sexual  abuse? no Do you have any family history of heart problems? Yes- father had multiple heart attacks and brother had an enlarged heart Do you smoke now or past usage? never    Quit date: n/a Do you drink alcohol now or past usage? Yes  - drinks currently usually 3x week and only 1-2 drinks at a time- reports drinking more than that very rarely Are you currently using illegal drugs or misuse of medication or past usage? never  Have you ever been treated for substance abuse? No      If yes, where and when did you receive treatment? n/a  Mental Health History How have you been feeling in the past year? Reports feeling down over the past year Have you ever had any problems with depression, anxiety or other mental health issues?  States he has been feeling depressed since 2013 when he was treated for prostate cancer- received a shot at that time that reduced his testosterone and feels as if this contributed to reduction in motivation to do things. Do you see a counselor, psychiatrist or therapist?  Sees a psychiatrist 2x a year to prescribe him medications, [name] If you are currently experiencing problems are  you interested in talking with a professional? No- inquired if he was interested in talking to a counselor for more ongoing  Have you or are you taking medications for anxiety/depression or any mental health concerns?  Yes  Current Medications: wellbutrin What are your coping strategies under stressful situations? Doesn't feel as if he feels acute stress so can't identify coping mechanisms. Are there any other stressors in your life?  None reported other than medical concerns. Have you had any past or current thoughts of suicide? No thoughts of harming himself but does report sometimes having thoughts of death How many hours do you sleep at night? 7 on average- takes medication How is your appetite? Has had low appetite recently Would you be interested in attending the LVAD support group?  Doesn't think he would be interested because he is so introverted- doesn't like the idea of being expected to interact with others.  PHQ2 Depression Scale: 0  Despite reported depressive symptoms and feelings of hopelessness for improvement in his situation when asked PHQ2 questions he answered that he doesn't have feelings of hopelessness or little interest in doing things.   CSW inquired regarding this change in reported symptoms and pt states that he feels like the medications he is on are evening him out so he doesn't feel acutely depressed. PHQ9 Depression scale (if positive PHQ2 screen):     Some concerns expressed by medical team for memory deficits.  Mini Mental Status exam completed and pt scored23/30 mild degree of impairment.  Head CT completed and was negative.  Further assessment for memory concerns might be necessary if planning to proceed with surgery.  Legal Do you currently have any legal issues/problems?  no Have you had any legal issues/problems in the past?  no Do you have a Durable POA?  no Name of DOA? no Contact number:  n/a  Plan for VAD Implementation Do you know and understand what happens during the VAD surgery? Patient Verbalizes Understanding  of surgery and able to describe details- stated that he understood something would be inserted in his chest that helps pump his blood and that he would have batteries and a monitor externally. What do you know about the risks and side effect associated with VAD surgery? Patient Verbalizes Understanding  of risks (infection, stroke and death)- verbalized that surgery is dangerous and that his heart could quit and not be able to restarted Explain what will happen right after surgery: Patient Verbalizes Understanding  of OR to ICU and will be intubated What is your plan for transportation for the first 8 weeks post-surgery? wife (Patients are not recommended to drive post-surgery for 8 weeks)  Driver: Peter Hilda Do you have  airbags in your vehicle?  There is a risk of discharging the device if the airbag were to deploy. Yes they have airbags and understand that it is not recommended for pt to ride in a seat with an airbag due to risk if deployed. What do you know about your diet post-surgery? Patient Verbalizes Understanding  of Heart healthy- states he needs to eat more fruit and veggies and avoid red meat How do you plan to monitor your medications, current and future?  Plans to continue taking medications as he did prior to admission which was straight out of the bottle when needed- reports this system worked for him and did not report issues with missing doses.  How do you plan to complete ADL's post-surgery?  Hopeful to feel better after surgery but understands  his wife will need to help him with most ADLs directly after surgery. Will it be difficult to ask for help from your caregivers?  No not at all  Please explain what you hope will be improved about your life as a result of receiving the LVAD? Hopes to have more energy and ambition/motivation to do things.  His goal would be to get back to wood working which he used to love and made him feel fulfilled as well as be able to assist with housework again. Please tell me your biggest concern or fear about living with the LVAD?  Biggest concern is inconvenience of external equipment he'll have to carry around. How do you cope with your concerns and fears?  Does not feel like he has to cope with these feelings- feels like this is a necessary next step so does not let himself worry about it. Please explain your understanding of how their body will change?  Understands that he will have a cord coming out of his abdomen and an incision on his chest. Are you worried about these changes? No, states that he is not vain like he was a young man so is not concerned about these changes. Do you see any barriers to your surgery or follow-up? no  Understanding of LVAD Patient  states understanding of the following: Surgical procedures and risks, Electrical need for LVAD (3 prong outlets), Safety precautions with LVAD (water, etc.), LVAD daily self-care (dressing changes, computer check, extra supplies), Outpatient follow up (LVAD clinic appts, monitoring blood thinners) and Need for Emergency Planning  Discussed and Reviewed with Patient and Caregiver  Patient's current level of motivation to prepare for LVAD: patient currently feeling pretty motivated for surgery- expressed one thing he wanted to know is if surgery would help the shaking in his hands or the weakness in his legs but even if surgery didn't fix these things he would still want to proceed. Patient's present Level of Consent for LVAD: ready    Education provided to patient/family/caregiver:   Caregiver role and responsibiltiy, Financial planning for LVAD, Role of Clinical Social Worker and Signs of Depression and Anxiety    Discussed and Reviewed with Patient and Caregiver  Caregiver questions Please explain what you hope will be improved about your life and loved one's life as a result of receiving the LVAD?  Pt wife is hopeful to have more help around the house and more opportunities for them to spend time doing active things together like take walks. Hopeful pt will start feeling well enough to do the things he likes like working in his wood shop. What is your biggest concern or fear about caregiving with an LVAD patient?  Reports being worried that she will make a mistake What is your plan for availability to provide care 24/7 x2 weeks post op and dressing changes ongoing?  Pt has no commitments that cannot be rearranged so will plan to prioritize caregiving. Who is the relief/backup caregiver and what is their availability?  Not identified at this time- several friends that pt wife thinks will be willing to assist Preferred method of learning? Written and Hands on  Do you drive? yes How do you  handle stressful situations?  Reports she practices self care when stressed- plays with their dogs or reads a book in a quiet place. Do you think you can do this? Yes- feels pretty sure Is there anything that concerns about caregiving?  Just worried what will happen if the problems start to  compound at home but feels like she can manage whatever comes her way. Do you provide caregiving to anyone else?  no  Caregiver's current level of motivation to prepare for LVAD: high Caregiver's present level of consent for LVAD: 100% ready  Clinical Interventions Needed:    CSW will monitor signs and symptoms of depression and assist with adjustment to life with an LVAD.  CSW will provide supportive intervention as needed. CSW encouraged attendance with the LVAD Support Group to assist further with adjustment and post implant peer support.  Clinical Impressions/Recommendations:   Mr. Lanahan is a 74 year old male who has been married for 43 years.  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  He states that he is compliant with medications, appts, and medical team recommendations.  Patient does not have a living will currently but is willing to complete one- palliative to assist with this process.  Pt wife will be the primary caregiver and no conflicts reported for her assisting pt 24/7 for the first two weeks- she plans to make pt the priority while he needs.  Patient is not involved in any community organizations at this time but reports being [place] he hasn't attended services in a long time as he doesn't like socializing with others.  Patient used to enjoy wood working and has a shop at home but has stopped with his medical concerns and now reports  watching TV is his only hobby.  Him and his wife depend on SSRI and wife's pension for income and report this is sufficient to pay for their needs.  No reported current or past substance abuse concerns.  Pt admits to current alcohol use but only a few times a week and no more than 2 drinks on an occasion.  He denies trauma or abuse.  Pt admits to long history of depression related to past medical issues and despite reporting feelings of hopelessness for improvement in his mood and a lack of motivation to do things he states he is stable on his medications and that they have made a positive change in his mood- wife confirms that pt mood has seemed improved lately.  Sees a psychiatrist who prescribes him his medications but is not interested in counseling at this time.  Reports no stressors in his life outside of medical concerns.   He is motivated to get LVAD with the goal that it will improve how he feels enough so he can get back to doing meaningful activities (wood working, house work, being active with his wife) and is hopeful this will improve his overall quality of life.  Reports only concern with LVAD surgery at this time and how cumbersome the equipment will be but is able to verbalize what all he would be carrying and understands these are a necessary burden and this will not deter him from wanting to proceed.  Jorge Ny, LCSW Clinical Social Worker [facility] Desk#: [phone] Cell#: [phone]

## 2020-11-25 DIAGNOSIS — Z7189 Other specified counseling: Secondary | ICD-10-CM | POA: Diagnosis not present

## 2020-11-25 DIAGNOSIS — Z515 Encounter for palliative care: Secondary | ICD-10-CM | POA: Diagnosis not present

## 2020-11-25 DIAGNOSIS — I5043 Acute on chronic combined systolic (congestive) and diastolic (congestive) heart failure: Secondary | ICD-10-CM | POA: Diagnosis not present

## 2020-11-25 DIAGNOSIS — I472 Ventricular tachycardia: Secondary | ICD-10-CM | POA: Diagnosis not present

## 2020-11-25 LAB — BLOOD GAS, ARTERIAL
Acid-base deficit: 1.5 mmol/L (ref 0.0–2.0)
Bicarbonate: 21.8 mmol/L (ref 20.0–28.0)
FIO2: 21
O2 Saturation: 96.1 %
Patient temperature: 36.8
pCO2 arterial: 30.5 mmHg — ABNORMAL LOW (ref 32.0–48.0)
pH, Arterial: 7.466 — ABNORMAL HIGH (ref 7.350–7.450)
pO2, Arterial: 77.3 mmHg — ABNORMAL LOW (ref 83.0–108.0)

## 2020-11-25 LAB — BASIC METABOLIC PANEL
Anion gap: 9 (ref 5–15)
BUN: 10 mg/dL (ref 8–23)
CO2: 22 mmol/L (ref 22–32)
Calcium: 9.1 mg/dL (ref 8.9–10.3)
Chloride: 105 mmol/L (ref 98–111)
Creatinine, Ser: 1.43 mg/dL — ABNORMAL HIGH (ref 0.61–1.24)
GFR, Estimated: 52 mL/min — ABNORMAL LOW (ref >60)
Glucose, Bld: 164 mg/dL — ABNORMAL HIGH (ref 70–99)
Potassium: 4.4 mmol/L (ref 3.5–5.1)
Sodium: 136 mmol/L (ref 135–145)

## 2020-11-25 LAB — CBC
HCT: 31.8 % — ABNORMAL LOW (ref 39.0–52.0)
Hemoglobin: 9.6 g/dL — ABNORMAL LOW (ref 13.0–17.0)
MCH: 25.9 pg — ABNORMAL LOW (ref 26.0–34.0)
MCHC: 30.2 g/dL (ref 30.0–36.0)
MCV: 85.7 fL (ref 80.0–100.0)
Platelets: 237 10*3/uL (ref 150–400)
RBC: 3.71 MIL/uL — ABNORMAL LOW (ref 4.22–5.81)
RDW: 15.3 % (ref 11.5–15.5)
WBC: 5.9 10*3/uL (ref 4.0–10.5)
nRBC: 0 % (ref 0.0–0.2)

## 2020-11-25 LAB — HEXAGONAL PHASE PHOSPHOLIPID: Hexagonal Phase Phospholipid: 2 s (ref 0–11)

## 2020-11-25 LAB — COOXEMETRY PANEL
Carboxyhemoglobin: 1.6 % — ABNORMAL HIGH (ref 0.5–1.5)
Methemoglobin: 0.6 % (ref 0.0–1.5)
O2 Saturation: 62 %
Total hemoglobin: 9.7 g/dL — ABNORMAL LOW (ref 12.0–16.0)

## 2020-11-25 LAB — LUPUS ANTICOAGULANT PANEL
DRVVT: 38.4 s (ref 0.0–47.0)
PTT Lupus Anticoagulant: 57.1 s — ABNORMAL HIGH (ref 0.0–51.9)

## 2020-11-25 LAB — MAGNESIUM: Magnesium: 2 mg/dL (ref 1.7–2.4)

## 2020-11-25 LAB — HEPARIN LEVEL (UNFRACTIONATED): Heparin Unfractionated: 0.37 IU/mL (ref 0.30–0.70)

## 2020-11-25 LAB — PTT-LA MIX: PTT-LA Mix: 51.8 s — ABNORMAL HIGH (ref 0.0–48.9)

## 2020-11-25 NOTE — Progress Notes (Signed)
Palliative:  HPI: 74 y.o. 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 EF 20% with LV thrombus, severe MR, moderate RV dysfunction. Began milrinone infusion.   I met today at [name]'s bedside with himself and his wife. He has had a busy morning with therapy but felt good about walking in the hallway. They are in the process of having Advance Directive notarized for completion. At this time they are hopeful to continue with LVAD evaluation process and remain hopeful this will be a good option for them. Wife, [name], reports that she has some errands to do tomorrow and likely will not be present at bedside. She will be available by phone for any further needs or updates.   All questions/concerns addressed. Emotional support provided.   Exam: Alert, oriented. Sometimes forgetful. Struggles to recall information and details from providers at times. No distress. Breathing regular, unlabored. Abd flat. Moves all extremities.   Plan: - They remain hopeful for LVAD option.  - MOST and Advance Directive completed.   15 min  Vinie Sill, NP Palliative Medicine Team Pager [phone] (Please see [email or web address] for schedule) Team Phone [phone]    Greater than 50%  of this time was spent counseling and coordinating care related to the above assessment and plan

## 2020-11-25 NOTE — Progress Notes (Signed)
This chaplain responded to PMT consult for spiritual care.  The chaplain is present with the Pt., Pt. Wife-[name], the notary, and two witnesses for notarizing of the Pt. Advance Directive:  HCPOA and Living Will.  The Pt. named [name] as his healthcare agent.  The original Advance Directive and a copy was given to the Pt.  A copy of the AD was scanned into EMR.  This chaplain is available for F/U spiritual care as needed.

## 2020-11-25 NOTE — Progress Notes (Signed)
PROGRESS NOTE  [name] DOB: 11/25/1946 DOA: 11/17/2020 PCP: Hoyt Koch, MD  Brief History   [name] a 74 y.o.malewith medical history significant ofCAD status post CABG and PCI, chronic combined systolic and diastolic CHF/ischemic cardiomyopathy with LVEF 20-25% and history of VT status post ICD, hypertension, hyperlipidemia,CKD stage IIIa,history of prostate and tonsillar cancer, history of DVT presenting to the ED with complaints of shortness of breath.Patient reports dyspnea on exertion and cough for the past 3 to 4 days. He was having low-grade fevers earlier but has not had them for the past few days. He has also had 3 episodes of vomiting over the past week which has now resolved. Denies abdominal pain or diarrhea. States his ICD had shocked him prior to his recent hospitalization but he has not had any issues lately. He is vaccinated against Covid.   ED Course:Afebrile.Tachypneic with respiratory rate in the 20s.Not tachycardic.Not hypoxic. WBC 7.0, hemoglobin 10.3, hematocrit 33.9, platelet 186K.Sodium 140, potassium 3.7, chloride 107, bicarb 22, BUN 27, creatinine 1.7, glucose 106. High-sensitivity troponin mildly elevated but stable (29 >29).BNP significantly elevated at 3663. SARS-CoV-2 PCR test and influenza panel both negative.  Chest x-ray showing cardiomegaly and small effusions in the posterior costophrenic angles; no frank edema.Patient was given IV Lasix 20 mg.  Vascular surgery was consulted due to the patient's carotid artery stenosis, Left ICA stenosis >80%. The patient was evaluated by Dr. Donzetta Matters on 11/24/2020. He determined that if the patient were to meet criteria for LVAD placement, it would make sense to perform CEA during the same procedure. However, if the patient were not found to be a candidate for the LVAD procedure, then he would not be seen as a candidate for CEA.  Consultants   . Cardiology . Vascular Surgery . LVAD Team. . Heart Failure Team  Procedures  . None  Antibiotics   Anti-infectives (From admission, onward)   None    .  Subjective  The patient is resting comfortably. No new complaints.  Objective   Vitals:  Vitals:   11/25/20 0732 11/25/20 1112  BP: 115/72 (!) 106/58  Pulse: 63 65  Resp: (!) 22 (!) 23  Temp: 98.2 F (36.8 C) 97.6 F (36.4 C)  SpO2: 95% 95%    Exam:  Constitutional:  . The patient is awake, alert, and oriented x 3. No acute distress. Respiratory:  . No increased work of breathing. . No wheezes, rales, or rhonchi . No tactile fremitus Cardiovascular:  . Regular rate and rhythm . No murmurs, ectopy, or gallups. . No lateral PMI. No thrills. Abdomen:  . Abdomen is soft, non-tender, non-distended . No hernias, masses, or organomegaly . Normoactive bowel sounds.  Musculoskeletal:  . No cyanosis, clubbing, or edema Skin:  . No rashes, lesions, ulcers . palpation of skin: no induration or nodules Neurologic:  . CN 2-12 intact . Sensation all 4 extremities intact Psychiatric:  . Mental status o Mood, affect appropriate o Orientation to person, place, time  . judgment and insight appear intact  I have personally reviewed the following:   Today's Data  . Vitals, BMP, CBC, methemeglobin,   Imaging  . CT head without contrast . Vascular ultrasound  Cardiology Data  . EKG . Echocardiogram (11/22/2020)  Scheduled Meds: . amiodarone  400 mg Oral BID  . aspirin EC  81 mg Oral Daily  . atorvastatin  80 mg Oral QPM  . buPROPion  300 mg Oral Daily  . Chlorhexidine Gluconate Cloth  6 each Topical Daily  . escitalopram  20 mg Oral Daily  . feeding supplement  237 mL Oral BID BM  . mexiletine  150 mg Oral Q8H  . multivitamin with minerals  1 tablet Oral Daily  . pantoprazole  40 mg Oral QAC breakfast  . sodium chloride flush  10-40 mL Intracatheter Q12H  . sodium chloride flush  3 mL Intravenous  Q12H  . zolpidem  5 mg Oral QHS   Continuous Infusions: . heparin 1,250 Units/hr (11/24/20 0842)  . milrinone 0.25 mcg/kg/min (11/24/20 0840)    Principal Problem:   VT (ventricular tachycardia) (HCC) Active Problems:   Hyperlipidemia with target LDL less than 70   Carotid artery disease (HCC)   Acute on chronic combined systolic and diastolic CHF (congestive heart failure) (HCC)   CHF exacerbation (HCC)   Acute on chronic kidney failure (HCC)   LOS: 8 days   A & P  Acute on chronic combined systolic and diastolic CHF: Patient presented with signs of fluid overload.  Chest x-ray shows cardiomegaly and small effusion in the posterior costophrenic angles. On admission the patient was found to have bibasilar rales, JVD and significantly elevated BNP of 3663. This was despite the use of milrinone drip at home. Echo from 10/24/2020 showing severely reduced ejection fraction of 20 to 25%, grade 2 diastolic dysfunction, moderate to severe MR. RHC on 11/23/2020 demonstrated mild pulmonary venous hypertension, improved volume status, and acceptable cardiac output on milrinone. The pulmonary artery pulsatility index was also found to be acceptable at 4.8. (Pulmonary artery pressure/right atrial pressure). Cardiology discontinued Lasix, Entresto, and Coreg. The patient has been continued on milrinone. Volume will be carefully monitored along with electrolytes and creatinine. Magnesium will be kept greater than 2 and potassium greater than 4. The patient is being evaluated by the VAD team, CTS and vascular surgery.  Monomorphic ventricular tachycardia: The patient was given loading dose of amiodarone and then was placed on a drip. He has been continued on amiodarone 400 mg twice daily and mexiletine. The patient underwent recent left heart cath on 10/23/2020 without any targets for revascularization. The assistance of cardiology and EP is appreciated.  LV thrombus: Noted on echo on 12/26. Shows 20%  ejection fraction with LV thrombus.  Severe MR.  Moderate RV dysfunction. Continue IV heparin as per pharmacy.  Carotid Stenosis: Doppler demonstrated >80% stenosis of the left ICA. Vascular surgery was consulted. The patient was evaluated by Dr. [name] on 11/24/2020. He determined that if the patient were to meet criteria for LVAD placement, it would make sense to perform CEA during the same procedure. However, if the patient were not found to be a candidate for the LVAD procedure, then he would not be seen as a candidate for CEA.  AKI on CKD stage IIIa: Kidney function improving. Creatinine today is down to 1.43. Entresto and Lasix was discontinued by cardiology. Continue to monitor kidney function closely. Repeat BMP tomorrow a.m.  Severe MR: Status post RHC on 12/27. Appreciate cardiology assistance.  History of coronary artery disease status post CABG and PCI:  Patient denies ACS symptoms.  Troponin mildly elevated but stable 29x2.  Suspected demand ischemia from decompensated CHF. Continue aspirin, and statin  Hypokalemia: Replenished.  Magnesium level: WNL.  Repeat BMP tomorrow a.m.  Hypertension: Stable.  Continue Coreg and Entresto. Monitor blood pressure closely.  Hyperlipidemia: Continue Lipitor  GERD: Continue PPI  Hypokalemia: Resolved.  Continue potassium supplement to keep potassium above 4.  Hypomagnesemia: Resolved  Depression/anxiety: Continue Lexapro, Wellbutrin and Ambien  Hypoalbuminemia: Prealbumin: 13. Dietitian consulted.  DVT prophylaxis: Heparin Code Status: None Family Communication: None present at bedside.  Plan of care discussed with patient in length and he verbalized understanding and agreed with it. Disposition Plan: T.B.D. Depends upon plans for LVAD and CEA.  Consultants:   Cardiology  Procedures:  Echo Right heart cath  Antimicrobials:   None  Status is: Inpatient  Dispo: The patient is from: Home   Anticipated d/c is to: Home  Anticipated d/c date is: TBD  Patient currently is not medically stable to d/c.  Chinita Schimpf, DO [facility] Direct contact: see [email or web address]  7PM-7AM contact night coverage as above 11/25/2020, 2:59 PM  LOS: 8 days

## 2020-11-25 NOTE — Plan of Care (Signed)
  Problem: Clinical Measurements: Goal: Cardiovascular complication will be avoided Outcome: Progressing   Problem: Coping: Goal: Level of anxiety will decrease Outcome: Progressing   Problem: Education: Goal: Understanding of CV disease, CV risk reduction, and recovery process will improve Outcome: Progressing   Problem: Cardiovascular: Goal: Vascular access site(s) Level 0-1 will be maintained Outcome: Progressing

## 2020-11-25 NOTE — Progress Notes (Signed)
ANTICOAGULATION CONSULT NOTE  Pharmacy Consult for heparin Indication: LV thrombus 12/26   Allergies  Allergen Reactions   Other Anaphylaxis    Insect bites (Has EpiPen)    Patient Measurements: Height: 6' (182.9 cm) Weight: 78.9 kg (173 lb 14.4 oz) (scale b) IBW/kg (Calculated) : 77.6 Heparin Dosing Weight: 82kg  Vital Signs: Temp: 98.2 F (36.8 C) (12/29 0732) Temp Source: Oral (12/29 0732) BP: 115/72 (12/29 0732) Pulse Rate: 63 (12/29 0732)  Labs: Recent Labs    11/23/20 0612 11/23/20 0754 11/23/20 1622 11/23/20 1930 11/23/20 2133 11/24/20 0450 11/25/20 0525 11/25/20 0629  HGB 9.5* 10.9*   10.2*  --   --   --  9.8* 9.6*  --   HCT 30.2* 32.0*   30.0*  --   --   --  31.0* 31.8*  --   PLT 232  --   --   --   --  244 237  --   LABPROT  --   --  14.2  --   --   --   --   --   INR  --   --  1.1  --   --   --   --   --   HEPARINUNFRC 1.02*  --   --    < > 0.29* 0.36  --  0.37  CREATININE 1.53*  --   --   --   --  1.50* 1.43*  --    < > = values in this interval not displayed.    Estimated Creatinine Clearance: 50.5 mL/min (A) (by C-G formula based on SCr of 1.43 mg/dL (H)).   Medical History: Past Medical History:  Diagnosis Date   Adult ADHD    AICD (automatic cardioverter/defibrillator) present    CAD (coronary artery disease)    Cath 2008, all grafts patent // Cath 2017 - all grafts patent    Cardiomyopathy, ischemic    EF (09) 25%, ICD Placed 2008 (epidoses of sustained monomorphic VT in the past)   Carotid artery disease ([place])    Doppler, February, 2012, mild plaque, 0-39% bilateral, plan followup in one year   Dyspepsia    Ejection fraction < 50%    Elevated PSA 02/15/2011   Esophageal stricture    GERD (gastroesophageal reflux disease)    HTN (hypertension)    Hx of CABG    1991   Hyperlipidemia    ICD (implantable cardiac defibrillator) battery depletion    Placed 2008  (eepisodes  nonsustained monomorphic VT in the past)    Malignant neoplasm of tonsil ([place])    Myocardial infarction ([place]) 1998   Prostate cancer ([place]) 2012   IMRT, ADT   Thrombosis of arm    Left   Venous thrombosis    Left arm      Assessment: 74 yo M with large laminated mural apical thrombus found on TTE 11/22/20, originally admitted for HF exacerbation. Pharmacy consulted for IV heparin. No AC PTA.   Heparin level this morning came back therapeutic at 0.37, on 1250 units/hr. Hgb 9.6, plt 237. No s/sx of bleeding or infusion issues.   Goal of Therapy:  Heparin level 0.3-0.7 units/ml Monitor platelets by anticoagulation protocol: Yes   Plan:  Continue heparin infusion to 1250 units/hr Monitor daily HL, CBC/plt Monitor for signs/symptoms of bleeding  F/u long term anticoagulation plan  Antonietta Jewel, PharmD, [place] Pharmacist  Phone: [phone] 11/25/2020 9:46 AM  Please check AMION for all [place] phone numbers After  10:00 PM, call Main Pharmacy [phone]

## 2020-11-25 NOTE — Progress Notes (Addendum)
Advanced Heart Failure Rounding Note   Subjective:    Milrinone started 12/25.    ECHO: 12/26 EF 20% with LV thrombus. Severe MR. Moderate RV dysfunction. Now on heparin. No bleeding  RHC 11/23/20 on milrinone 0.25 mcg.  RA = 6 RV = 56/10 PA = 51/22 (33) PCW = 19 Thermal cardiac output/index = 5.5/2.7 Fick CO/CI = 6.2/3.0 PVR = 2.3 WU Ao sat = 94% PA sat = 59%, 61% PAPi = 4.8   Remains on milrinone 0.25  mcg. CO-OX 62%.   Worked with PT earlier today. Says he had a hard time standing. His wife said he had a fall everyday the week before he was admitted. Wants to get back to bed to watch [place] game at 1200?     Objective:   Weight Range:  Vital Signs:   Temp:  [97.8 F (36.6 C)-99.1 F (37.3 C)] 98.2 F (36.8 C) (12/29 0732) Pulse Rate:  [61-69] 63 (12/29 0732) Resp:  [17-22] 22 (12/29 0732) BP: (107-126)/(59-78) 115/72 (12/29 0732) SpO2:  [95 %-97 %] 95 % (12/29 0732) Weight:  [78.9 kg] 78.9 kg (12/29 0451) Last BM Date: 11/24/20  Weight change: Filed Weights   11/23/20 0218 11/24/20 0431 11/25/20 0451  Weight: 80.7 kg 80.6 kg 78.9 kg    Intake/Output:   Intake/Output Summary (Last 24 hours) at 11/25/2020 1002 Last data filed at 11/25/2020 0850 Gross per 24 hour  Intake 534.38 ml  Output 1500 ml  Net -965.62 ml    CVP 7-8 Physical Exam: General:  Sitting in the chair.  No resp difficulty HEENT: normal Neck: supple. no JVD. Carotids 2+ bilat; no bruits. No lymphadenopathy or thryomegaly appreciated. Cor: PMI nondisplaced. Regular rate & rhythm. No rubs, gallops or murmurs. Lungs: clear Abdomen: soft, nontender, nondistended. No hepatosplenomegaly. No bruits or masses. Good bowel sounds. Extremities: no cyanosis, clubbing, rash, edema. RUE PICC Neuro: alert & orientedx3, cranial nerves grossly intact. moves all 4 extremities w/o difficulty. Affect pleasant  Telemetry:  NSR 60-70s   Labs: Basic Metabolic Panel: Recent Labs  Lab  11/21/20 0242 11/22/20 0539 11/23/20 0612 11/23/20 0754 11/24/20 0450 11/25/20 0525  NA 138 138 139 143  144 138 136  K 4.0 3.2* 4.0 4.1  3.9 4.2 4.4  CL 103 102 106  --  107 105  CO2 23 25 23   --  23 22  GLUCOSE 98 85 93  --  91 164*  BUN 27* 23 17  --  11 10  CREATININE 1.96* 1.81* 1.53*  --  1.50* 1.43*  CALCIUM 8.4* 8.3* 8.2*  --  8.4* 9.1  MG 1.9 2.0 1.8  --  2.2 2.0    Liver Function Tests: Recent Labs  Lab 11/24/20 0450  AST 40  ALT 127*  ALKPHOS 110  BILITOT 1.5*  PROT 5.5*  ALBUMIN 2.9*   No results for input(s): LIPASE, AMYLASE in the last 168 hours. No results for input(s): AMMONIA in the last 168 hours.  CBC: Recent Labs  Lab 11/19/20 0301 11/23/20 0612 11/23/20 0754 11/24/20 0450 11/25/20 0525  WBC 7.2 5.8  --  5.3 5.9  NEUTROABS  --   --   --  3.3  --   HGB 10.6* 9.5* 10.9*  10.2* 9.8* 9.6*  HCT 33.0* 30.2* 32.0*  30.0* 31.0* 31.8*  MCV 85.5 84.1  --  85.4 85.7  PLT 189 232  --  244 237    Cardiac Enzymes: No results for input(s): CKTOTAL, CKMB,  CKMBINDEX, TROPONINI in the last 168 hours.  BNP: BNP (last 3 results) Recent Labs    11/17/20 1645  BNP 3,663.4*    ProBNP (last 3 results) No results for input(s): PROBNP in the last 8760 hours.    Other results:  Imaging: CT HEAD WO CONTRAST  Result Date: 11/24/2020 CLINICAL DATA:  Memory loss. EXAM: CT HEAD WITHOUT CONTRAST TECHNIQUE: Contiguous axial images were obtained from the base of the skull through the vertex without intravenous contrast. COMPARISON:  Report from remote head and sinus CT 2001. FINDINGS: Brain: Generalized cerebral atrophy. Minor periventricular and deep white matter hypodensity typical of chronic small vessel ischemia, typical for age. No acute or prior infarct. No hemorrhage. No hydrocephalus. The basilar cisterns are patent. Gray-white differentiation is preserved. Vascular: Atherosclerosis of skullbase vasculature without hyperdense vessel or abnormal  calcification. Skull: No fracture or focal lesion. Sinuses/Orbits: Opacification of right maxillary sinus with heterogeneous density and air-fluid level. There is circumferential mucosal thickening. Mild cortical thickening. Scattered opacification of ethmoid air cells and right frontal sinus. The mastoid air cells are clear. Orbits are unremarkable. Other: None. IMPRESSION: 1. Generalized cerebral atrophy and mild chronic small vessel ischemia. No acute intracranial abnormality. 2. Chronic right maxillary sinusitis with heterogeneous contents. Electronically Signed   By: Keith Rake M.D.   On: 11/24/2020 17:08   VAS US DOPPLER PRE VAD  Result Date: 11/24/2020 PERIOPERATIVE VASCULAR EVALUATION Indications:            Pre-VAD workup. Risk Factors:           Hypertension, hyperlipidemia, coronary artery disease. Vascular Interventions: Hx of CABG. Performing Technologist: Darlin Coco RDMS  Examination Guidelines: A complete evaluation includes B-mode imaging, spectral Doppler, color Doppler, and power Doppler as needed of all accessible portions of each vessel. Bilateral testing is considered an integral part of a complete examination. Limited examinations for reoccurring indications may be performed as noted.  Right Carotid Findings: +----------+--------+--------+--------+---------------------------+--------+           PSV cm/sEDV cm/sStenosisDescribe                   Comments +----------+--------+--------+--------+---------------------------+--------+ CCA Prox  87      21                                                  +----------+--------+--------+--------+---------------------------+--------+ CCA Distal73      22              heterogenous                        +----------+--------+--------+--------+---------------------------+--------+ ICA Prox  61      17      1-39%   heterogenous and hypoechoic          +----------+--------+--------+--------+---------------------------+--------+ ICA Distal58      22                                                  +----------+--------+--------+--------+---------------------------+--------+ ECA       103     11                                                  +----------+--------+--------+--------+---------------------------+--------+  Portions of this table do not appear on this page. +----------+--------+-------+----------------+------------+           PSV cm/sEDV cmsDescribe        Arm Pressure +----------+--------+-------+----------------+------------+ Subclavian108            Multiphasic, WNL             +----------+--------+-------+----------------+------------+ +---------+--------+--+--------+--+---------+ VertebralPSV cm/s34EDV cm/s12Antegrade +---------+--------+--+--------+--+---------+ Left Carotid Findings: +----------+--------+--------+--------+-------------------+--------------------+           PSV cm/sEDV cm/sStenosisDescribe           Comments             +----------+--------+--------+--------+-------------------+--------------------+ CCA Prox  106     27              hyperechoic and                                                           heterogenous                            +----------+--------+--------+--------+-------------------+--------------------+ CCA Mid   159     40              heterogenous and                                                          hypoechoic                              +----------+--------+--------+--------+-------------------+--------------------+ CCA Distal182     34              heterogenous                            +----------+--------+--------+--------+-------------------+--------------------+ ICA Prox  302     102     80-99%  heterogenous and   Velocities may                                         hypoechoic         underestimate degree                                                       of stenosis due to                                                        more proximal  obstruction.         +----------+--------+--------+--------+-------------------+--------------------+ ICA Mid   281     113     80-99%  heterogenous                            +----------+--------+--------+--------+-------------------+--------------------+ ICA Distal137     40                                                      +----------+--------+--------+--------+-------------------+--------------------+ ECA       87      11                                                      +----------+--------+--------+--------+-------------------+--------------------+ +----------+--------+--------+----------------+------------+ SubclavianPSV cm/sEDV cm/sDescribe        Arm Pressure +----------+--------+--------+----------------+------------+           152             Multiphasic, [id number]          +----------+--------+--------+----------------+------------+ +---------+--------+--+--------+--+---------+ VertebralPSV cm/s36EDV cm/s14Antegrade +---------+--------+--+--------+--+---------+  ABI Findings: +--------+------------------+-----+---------+----------------------------------+ Right   Rt Pressure (mmHg)IndexWaveform Comment                            +--------+------------------+-----+---------+----------------------------------+ Brachial                       triphasicUnable to obtain pressure due to                                           bandaging/IV                       +--------+------------------+-----+---------+----------------------------------+ PTA     152               1.21 triphasic                                   +--------+------------------+-----+---------+----------------------------------+ DP      156               1.24  triphasic                                   +--------+------------------+-----+---------+----------------------------------+ +--------+------------------+-----+---------+-------+ Left    Lt Pressure (mmHg)IndexWaveform Comment +--------+------------------+-----+---------+-------+ [id number]                                     +--------+------------------+-----+---------+-------+ PTA     157               1.25 triphasic        +--------+------------------+-----+---------+-------+ DP      158               1.25 triphasic        +--------+------------------+-----+---------+-------+  Summary: Right  Carotid: Velocities in the right ICA are consistent with a 1-39% stenosis. Left Carotid: Velocities in the left ICA are consistent with a 80-99% stenosis. Vertebrals:  Bilateral vertebral arteries demonstrate antegrade flow. Subclavians: Normal flow hemodynamics were seen in bilateral subclavian              arteries.  *See table(s) above for measurements and observations. Right ABI: Resting right ankle-brachial index is within normal range. No evidence of significant right lower extremity arterial disease. Left ABI: Resting left ankle-brachial index is within normal range. No evidence of significant left lower extremity arterial disease.  Electronically signed by Servando Snare MD on 11/24/2020 at 2:19:14 PM.    Final    VAS [place] LOWER EXTREMITY VENOUS (DVT)  Result Date: 11/24/2020  Lower Venous DVT Study Indications: Pre-VAD workup.  Anticoagulation: Heparin. Comparison Study: No prior studies. Performing Technologist: Darlin Coco RDMS  Examination Guidelines: A complete evaluation includes B-mode imaging, spectral Doppler, color Doppler, and power Doppler as needed of all accessible portions of each vessel. Bilateral testing is considered an integral part of a complete examination. Limited examinations for reoccurring indications may be performed as noted. The reflux portion of the exam is  performed with the patient in reverse Trendelenburg.  +---------+---------------+---------+-----------+----------+--------------+ RIGHT    CompressibilityPhasicitySpontaneityPropertiesThrombus Aging +---------+---------------+---------+-----------+----------+--------------+ CFV      Full           Yes      Yes                                 +---------+---------------+---------+-----------+----------+--------------+ SFJ      Full                                                        +---------+---------------+---------+-----------+----------+--------------+ FV Prox  Full                                                        +---------+---------------+---------+-----------+----------+--------------+ FV Mid   Full                                                        +---------+---------------+---------+-----------+----------+--------------+ FV DistalFull                                                        +---------+---------------+---------+-----------+----------+--------------+ PFV      Full                                                        +---------+---------------+---------+-----------+----------+--------------+ POP      Full  Yes      Yes                                 +---------+---------------+---------+-----------+----------+--------------+ PTV      Full                                                        +---------+---------------+---------+-----------+----------+--------------+ PERO     Full                                                        +---------+---------------+---------+-----------+----------+--------------+   +---------+---------------+---------+-----------+----------+--------------+ LEFT     CompressibilityPhasicitySpontaneityPropertiesThrombus Aging +---------+---------------+---------+-----------+----------+--------------+ CFV      Full           Yes      Yes                                  +---------+---------------+---------+-----------+----------+--------------+ SFJ      Full                                                        +---------+---------------+---------+-----------+----------+--------------+ FV Prox  Full                                                        +---------+---------------+---------+-----------+----------+--------------+ FV Mid   Full                                                        +---------+---------------+---------+-----------+----------+--------------+ FV DistalFull                                                        +---------+---------------+---------+-----------+----------+--------------+ PFV      Full                                                        +---------+---------------+---------+-----------+----------+--------------+ POP      Full           Yes      Yes                                 +---------+---------------+---------+-----------+----------+--------------+ PTV  Full                                                        +---------+---------------+---------+-----------+----------+--------------+ PERO     Full                                                        +---------+---------------+---------+-----------+----------+--------------+     Summary: RIGHT: - There is no evidence of deep vein thrombosis in the lower extremity.  - A cystic structure is found in the popliteal fossa.  LEFT: - There is no evidence of deep vein thrombosis in the lower extremity.  - No cystic structure found in the popliteal fossa.  *See table(s) above for measurements and observations. Electronically signed by Lemar Livings MD on 11/24/2020 at 2:19:44 PM.    Final    CT CHEST ABDOMEN PELVIS WO CONTRAST  Result Date: 11/23/2020 CLINICAL DATA:  Presurgical evaluation for LVAD EXAM: CT CHEST, ABDOMEN AND PELVIS WITHOUT CONTRAST TECHNIQUE: Multidetector CT imaging of the chest, abdomen and pelvis  was performed following the standard protocol without IV contrast. COMPARISON:  CT angio chest, abdomen, pelvis 12/21/2015. FINDINGS: Lines and tubes: Right PICC with tip terminating at the superior cavoatrial junction. Two lead left chest wall cardiac pacemaker/defibrillator with leads terminating in the event right ventricle and right atria. CT CHEST FINDINGS Cardiovascular: Surgical changes related to a coronary artery bypass graft. Left atrial and ventricular enlargement. No significant pericardial effusion. The thoracic aorta is normal in caliber. No atherosclerotic plaque of the thoracic aorta. At least mild to moderate three-vessel coronary artery calcifications. Coronary artery calcifications. Mediastinum/Nodes: No enlarged mediastinal, hilar, or axillary lymph nodes. Thyroid gland, trachea, and esophagus demonstrate no significant findings. At least small volume hiatal hernia. Lungs/Pleura: No focal consolidation. Left apical atelectasis versus scarring again noted. No pulmonary nodule or mass. Bilateral trace, left greater than right, pleural effusions. No pneumothorax. Musculoskeletal: No chest wall abnormality. No suspicious lytic or blastic osseous lesions. No acute displaced fracture. Multilevel degenerative changes of the spine. Status post right shoulder rotator cuff anchor suture repair. CT ABDOMEN PELVIS FINDINGS Hepatobiliary: No focal liver abnormality is seen. Layering hyperdensity within the gallbladder lumen consistent with gallstones. No associated gallbladder wall thickening or pericholecystic fluid. No biliary dilatation. Pancreas: No focal lesion. Normal pancreatic contour. No surrounding inflammatory changes. No main pancreatic ductal dilatation. Spleen: Normal in size without focal abnormality. A splenule is identified. Adrenals/Urinary Tract: No adrenal nodule bilaterally. Nonspecific bilateral perinephric stranding. No nephrolithiasis, no hydronephrosis, and no contour-deforming renal  mass. No ureterolithiasis or hydroureter. The urinary bladder is unremarkable. Stomach/Bowel: Stomach is within normal limits. No evidence of bowel wall thickening or dilatation. Suggestion of a second portion of the duodenum diverticula. Diffuse sigmoid diverticulosis. Mobile cecum. The appendix not visualized. Vascular/Lymphatic: No abdominal aorta or iliac aneurysm. At least moderate atherosclerotic plaque of the aorta and its branches. No abdominal, pelvic, or inguinal lymphadenopathy. Reproductive: Metallic density surrounding the prostate likely represent radiation therapy seeds. Otherwise the prostate is unremarkable. Other: Nonspecific mild presacral stranding that is likely related to radiation treatment of the prostate. No intraperitoneal free fluid. No intraperitoneal free gas. No organized fluid collection.  Musculoskeletal: Anterior abdominal wall subcutaneus soft tissue densities and couple foci of gas likely related to medicine injections. Tiny fat containing umbilical hernia. No suspicious lytic or blastic osseous lesions. No acute displaced fracture. Multilevel degenerative changes of the spine. IMPRESSION: 1. Please note limited evaluation on this noncontrast study. 2. Bilateral trace pleural effusions. 3. Cholelithiasis with no CT findings to suggest acute cholecystitis or choledocholithiasis. 4. Diffuse sigmoid diverticulosis with no acute diverticulitis. 5. At least small volume hiatal hernia. 6. Anterior abdominal wall subcutaneus soft tissue densities and couple foci of gas likely related to medicine injections. Please correlate with physical exam and history. 7. 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 8. Aortic Atherosclerosis (ICD10-I70.0) as well as at least mild to moderate three-vessel coronary artery calcifications. Electronically Signed   By: Iven Finn M.D.   On: 11/23/2020 18:14     Medications:     Scheduled Medications: . amiodarone   400 mg Oral BID  . aspirin EC  81 mg Oral Daily  . atorvastatin  80 mg Oral QPM  . buPROPion  300 mg Oral Daily  . Chlorhexidine Gluconate Cloth  6 each Topical Daily  . escitalopram  20 mg Oral Daily  . feeding supplement  237 mL Oral BID BM  . mexiletine  150 mg Oral Q8H  . multivitamin with minerals  1 tablet Oral Daily  . pantoprazole  40 mg Oral QAC breakfast  . sodium chloride flush  10-40 mL Intracatheter Q12H  . sodium chloride flush  3 mL Intravenous Q12H  . zolpidem  5 mg Oral QHS    Infusions: . heparin 1,250 Units/hr (11/24/20 0842)  . 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 Severe MR. Moderate RV dysfunction. Now on heparin. No bleeding - NYHA IV.  Low output physiology confirmed Co-ox (12/25) 44%. - RHC 11/24/20 on RA 6, PCWP 19, CO 6.2/3.0  - Milrinone started on 12/25. Cath was on milrinone 0.25 mcg with stable hemodynamcis on cath.  - Todays CO-OX  62% on milrinone 0.25 mcg   - Off b-blocker with shock - Off Entresto with worsening renal function.  - Hold off on losartan tomorrow.  - Seems to be good candidate. RV has moderate dysfunction but CVP low.  -CT surgery consulted. VAD coordinators following.  -   2.Monomorphic ventricular tachycardia -No further recurrence. ICD VT zones have been adjusted. Continue amiodarone.  - Keep K> 4.0 Mg > 2.0 -Recent left heart catheterization on 10/23/2020 without any targets for revascularization - No VT.   3.CAD status post CABG -Recent left heart cath with severe three-vessel CAD. -He has patent vein grafts. (LIMA -> LAD, SVG-> RCA, SVG -> OM2 -> OM3) -No s/s  angina -Continue aspirin and Lipitor  4.Severe mitral regurgitation - pending VAD  5. AKI  - due to cardiorenal/ATN - baseline 1.1 - 1.3-> 1.7 -> 1.8 -> 2.0 -> 1.8 ->1.5 ->1.4  - improving with hemodynamic support  6. Hypokalemia - K stable.   7. Apical LV mural thrombus - heparin started.   8. Hypoalbuminemia.  Prealbumin 13 .  Consult dietitian   9. Carotid Disease [id number] LICA stenosis on pre-surgical carotid dopplers.  VVS consulted.  Final intervention pending VAD work up.   10. Deconditioning PT following-- Concerned about his safety. Difficulty standing. Had falls prior to admit.   11. Memory Issues  CT head with mild small vessel disease and cerebral atrophy.   VAD work ongoing.   Length of Stay: [place] NP-C  11/25/2020, 10:02 AM  Advanced Heart Failure Team Pager [phone] (M-F; 7a - 4p)  Please contact [facility] for night-coverage after hours (4p -7a ) and weekends on [email or web address]  Patient seen with NP, agree with the above note.   Stable today, co-ox 62% with CVP 6-7 on milrinone 0.25.  He is not on a diuretic currently.   On heparin gtt with LV thrombus.   He is on both amiodarone and mexiletine with history of VT.   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 Prealbumin 13.   General: NAD Neck: No JVD, no thyromegaly or thyroid nodule.  Lungs: Clear to auscultation bilaterally with normal respiratory effort. CV: Nondisplaced PMI.  Heart regular S1/S2, no S3/S4, no murmur.  2/6 HSM apex.    Abdomen: Soft, nontender, no hepatosplenomegaly, no distention.  Skin: Intact without lesions or rashes.  Neurologic: Alert and oriented x 3.  Psych: Normal affect. Extremities: No clubbing or cyanosis.  HEENT: Normal.   Patient is inotrope dependent, continue milrinone 0.25.  Does not need Lasix currently, continue to follow CVP.   Continue anticoagulation for LV thrombus.   Extensive CAD, no interventional option.   Of  note, he has [id number] LICA stenosis on pre-surgical carotid dopplers.  VVS has seen, if gets LVAD will need to fix concomitantly.   Working up for LVAD.  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  He required significant assistance standing up.  ?If he needs inpatient rehab prior to surgery.  Will discuss with team.   Loralie Champagne 11/25/2020

## 2020-11-25 NOTE — Evaluation (Signed)
Physical Therapy Evaluation Patient Details Name: [name] MRN: [id number] DOB: 12/22/1945 Today's Date: 11/25/2020   History of Present Illness  74 y.o. 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 EF 20% with LV thrombus, severe MR, moderate RV dysfunction.  Clinical Impression  Prior to admission, pt reports his lives with his wife and is independent (unsure of accuracy as no visitor there to confirm). Pt presents with decreased functional mobility secondary to poor sitting/standing balance, cognitive deficits, gait abnormalities, weakness and decreased endurance. Pt requires moderate assist to stand, ambulating 60 feet with a walker at a min assist level. Presents as a high fall risk based on decreased gait speed, safety awareness and history of falls. Recommending CIR to maximize functional independence and decrease caregiver burden.     Follow Up Recommendations CIR    Equipment Recommendations  3in1 (PT)    Recommendations for Other Services       Precautions / Restrictions Precautions Precautions: Fall Restrictions Weight Bearing Restrictions: No      Mobility  Bed Mobility Overal bed mobility: Needs Assistance Bed Mobility: Supine to Sit     Supine to sit: Supervision     General bed mobility comments: Supervision for safety    Transfers Overall transfer level: Needs assistance Equipment used: Rolling walker (2 wheeled) Transfers: Sit to/from Stand Sit to Stand: Mod assist         General transfer comment: Pt with x2 attempts to successfully reach upright, with difficulty achieving momentum, ultimately requiring modA to  boost up and initially steady. Pt with heavy posterior lean, max cues for anterior weight shift. Decreased eccentric control with transition to sitting noted.  Ambulation/Gait Ambulation/Gait assistance: Min assist Gait Distance (Feet): 60 Feet Assistive device: Rolling walker (2 wheeled) Gait Pattern/deviations: Step-through pattern;Decreased stride length;Scissoring;Narrow base of support Gait velocity: decreased Gait velocity interpretation: <1.8 ft/sec, indicate of risk for recurrent falls General Gait Details: Cues for wider BOS, intermittent scissoring, manual assist provided for walker negotiation  Stairs            Wheelchair Mobility    Modified Rankin (Stroke Patients Only)       Balance Overall balance assessment: Needs assistance Sitting-balance support: Feet supported Sitting balance-Leahy Scale: Poor Sitting balance - Comments: reliant on BUE support, posterior lean   Standing balance support: Bilateral upper extremity supported Standing balance-Leahy Scale: Poor Standing balance comment: reliant on RW and external support                             Pertinent Vitals/Pain Pain Assessment: No/denies pain    Home Living Family/patient expects to be discharged to:: Private residence Living Arrangements: Spouse/significant other Available Help at Discharge: Family Type of Home: House Home Access: Stairs to enter Entrance Stairs-Rails: Chemical engineer of Steps: 2 (in the front) Home Layout: One level Home Equipment: Media planner - 2 wheels      Prior Function Level of Independence: Independent         Comments: Drives, community ambulatory (per patient)     Hand Dominance        Extremity/Trunk Assessment   Upper Extremity Assessment Upper Extremity Assessment:  Defer to OT evaluation    Lower Extremity Assessment Lower Extremity Assessment: Generalized weakness       Communication    Communication: No difficulties  Cognition Arousal/Alertness: Awake/alert Behavior During Therapy: Flat affect Overall Cognitive Status: Impaired/Different from baseline Area of Impairment: Memory;Safety/judgement;Awareness;Problem solving                     Memory: Decreased short-term memory   Safety/Judgement: Decreased awareness of deficits;Decreased awareness of safety Awareness: Intellectual Problem Solving: Requires verbal cues General Comments: Pt A&Ox3, not oriented to day of week, stating it was Sunday. Pt with decreased awareness of safety/deficits, STM deficits with only 2/3 short term memory recall.      General Comments      Exercises     Assessment/Plan    PT Assessment Patient needs continued PT services  PT Problem List Decreased strength;Decreased activity tolerance;Decreased balance;Decreased mobility;Decreased cognition;Decreased coordination;Decreased safety awareness       PT Treatment Interventions DME instruction;Gait training;Stair training;Functional mobility training;Therapeutic activities;Therapeutic exercise;Balance training;Patient/family education    PT Goals (Current goals can be found in the Care Plan section)  Acute Rehab PT Goals Patient Stated Goal: did not state PT Goal Formulation: With patient Time For Goal Achievement: 12/09/20 Potential to Achieve Goals: Good    Frequency Min 3X/week   Barriers to discharge        Co-evaluation               AM-PAC PT "6 Clicks" Mobility  Outcome Measure Help needed turning from your back to your side while in a flat bed without using bedrails?: None Help needed moving from lying on your back to sitting on the side of a flat bed without using bedrails?: None Help needed moving to and from a bed to a chair (including a wheelchair)?: A Little Help needed standing up from a chair using your arms (e.g., wheelchair or bedside chair)?: A Lot Help needed to walk in hospital room?: A  Little Help needed climbing 3-5 steps with a railing? : A Lot 6 Click Score: 18    End of Session Equipment Utilized During Treatment: Gait belt Activity Tolerance: Patient tolerated treatment well Patient left: in chair;with call bell/phone within reach;with chair alarm set Nurse Communication: Mobility status PT Visit Diagnosis: Unsteadiness on feet (R26.81);Other abnormalities of gait and mobility (R26.89);Muscle weakness (generalized) (M62.81);Difficulty in walking, not elsewhere classified (R26.2);History of falling (Z91.81)    Time: 5621-3086 PT Time Calculation (min) (ACUTE ONLY): 30 min   Charges:   PT Evaluation $PT Eval Moderate Complexity: 1 Mod PT Treatments $Gait Training: 8-22 mins        Lillia Pauls, PT, DPT Acute Rehabilitation Services Pager [phone] Office [phone]   Norval Morton 11/25/2020, 12:17 PM

## 2020-11-25 NOTE — Progress Notes (Signed)
Occupational Therapy Evaluation Patient Details Name: [name] MRN: [id number] DOB: 1946/01/14 Today's Date: 11/25/2020    History of Present Illness 74 y.o. 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 EF 20% with LV thrombus, severe MR, moderate RV dysfunction.   Clinical Impression   Pt presents with above diagnosis. PTA pt PLOF living at home with wife, Mod I with increased due to weakness functional stability, however, reports no use of AE in home environment or community. Pt currently is limited with safe ADL engagement due to weakness, stability, and tolerance. Pt will benefit from additional acute OT to address established deficits and will benefit from CIR to safely perform ADLs and return to home environment.      Follow Up Recommendations  CIR    Equipment Recommendations  3 in 1 bedside commode    Recommendations for Other Services       Precautions / Restrictions Precautions Precautions: Fall      Mobility Bed Mobility Overal bed mobility: Needs Assistance Bed Mobility: Supine to Sit     Supine to sit: Supervision     General bed mobility comments: Supervision for safety    Transfers Overall transfer level: Needs assistance Equipment used: Rolling walker (2 wheeled) Transfers: Sit to/from Stand Sit to Stand: Mod assist         General transfer comment: Pt with x2 attempts to successfully reach upright, with difficulty achieving momentum, ultimately requiring modA to boost up and initially steady. Pt with heavy posterior lean, max cues for anterior weight shift. Decreased eccentric control with transition to sitting  noted.    Balance Overall balance assessment: Needs assistance Sitting-balance support: Feet supported Sitting balance-Leahy Scale: Poor Sitting balance - Comments: reliant on BUE support, posterior lean   Standing balance support: Bilateral upper extremity supported Standing balance-Leahy Scale: Poor Standing balance comment: reliant on RW and external support                           ADL either performed or assessed with clinical judgement   ADL Overall ADL's : Needs assistance/impaired Eating/Feeding: Set up;Sitting;Bed level   Grooming: Min guard;Sitting Grooming Details (indicate cue type and reason): min guard for safety in sitting. Upper Body Bathing: Min guard   Lower Body Bathing: Min guard   Upper Body Dressing : Min guard   Lower Body Dressing: Min guard Lower Body Dressing Details (indicate cue type and reason): Pt able to present LE to figure to safely don/doff socks limitations with sitting stability, min guard for safety. Toilet Transfer: Moderate assistance Toilet Transfer Details (indicate cue type and reason): simulated toilet transfer from EOB with side step toward HOB. Shaky but no LOB or instabiltiy observed.         Functional mobility during ADLs: Moderate assistance;+2 for safety/equipment General ADL Comments: Pt is weak with UE strength and sitting stability to perform ADLs.     Vision         Perception     Praxis      Pertinent Vitals/Pain Pain Assessment: No/denies pain     Hand Dominance     Extremity/Trunk Assessment Upper Extremity Assessment Upper Extremity Assessment: Generalized  weakness   Lower Extremity Assessment Lower Extremity Assessment: Defer to PT evaluation       Communication Communication Communication: No difficulties   Cognition Arousal/Alertness: Awake/alert Behavior During Therapy: Flat affect Overall Cognitive Status: Impaired/Different from baseline Area of Impairment:  Memory;Safety/judgement;Awareness;Problem solving                     Memory: Decreased short-term memory   Safety/Judgement: Decreased awareness of deficits;Decreased awareness of safety Awareness: Intellectual Problem Solving: Requires verbal cues General Comments: Pt A&Ox3, not oriented to day of week, stating it was Sunday. Pt with decreased awareness of safety/deficits, STM deficits with only 2/3 short term memory recall.   General Comments  VSS    Exercises     Shoulder Instructions      Home Living Family/patient expects to be discharged to:: Private residence Living Arrangements: Spouse/significant other Available Help at Discharge: Family Type of Home: House Home Access: Stairs to enter [employer] of Steps: 2 (in the front) Entrance Stairs-Rails: Left;Right Home Layout: One level     Bathroom Shower/Tub: Walk-in shower         Home Equipment: Dance movement psychotherapist - 2 wheels   Additional Comments: no RW for mobility, reports a sedentary lifestyle.      Prior Functioning/Environment Level of Independence: Independent        Comments: Drives, community ambulatory (per patient)        OT Problem List: Decreased strength;Decreased activity tolerance;Decreased cognition;Decreased safety awareness;Decreased knowledge of use of DME or AE      OT Treatment/Interventions: Self-care/ADL training;Therapeutic exercise;Energy conservation;DME and/or AE instruction;Patient/family education;Balance training;Therapeutic activities;Cognitive remediation/compensation    OT Goals(Current goals can be found in the care plan section) Acute Rehab OT Goals Patient Stated Goal: did not state OT Goal Formulation: With patient Time For Goal Achievement: 12/09/20 Potential to Achieve Goals: Fair  OT Frequency: Min 3X/week   Barriers to D/C: Inaccessible home environment  reports no use of AE with hx of falls.       Co-evaluation               AM-PAC OT "6 Clicks" Daily Activity     Outcome Measure Help from another person eating meals?: A Little Help from another person taking care of personal grooming?: A Little Help from another person toileting, which includes using toliet, bedpan, or urinal?: A Little Help from another person bathing (including washing, rinsing, drying)?: A Lot Help from another person to put on and taking off regular upper body clothing?: A Little Help from another person to put on and taking off regular lower body clothing?: A Little 6 Click Score: 17   End of Session Equipment Utilized During Treatment: Gait belt;Rolling walker;Oxygen Nurse Communication: Mobility status  Activity Tolerance: Patient tolerated treatment well Patient left: in bed;with bed alarm set;with call bell/phone within reach  OT Visit Diagnosis: Unsteadiness on feet (R26.81);Muscle weakness (generalized) (M62.81);Repeated falls (R29.6);Other abnormalities of gait and mobility (R26.89);History of falling (Z91.81)                Time: 4132-4401 OT Time Calculation (min): 24 min Charges:  OT General Charges $OT Visit: 1 Visit OT Evaluation $OT Eval Low Complexity: 1 Low OT Treatments $Self Care/Home Management : 8-22 mins  [name], MSOT, OTR/L  [facility] Rehabilitation Services  [phone]   [name] 11/25/2020, 4:03 PM

## 2020-11-26 ENCOUNTER — Inpatient Hospital Stay (HOSPITAL_COMMUNITY): Payer: Medicare Other

## 2020-11-26 DIAGNOSIS — Z7189 Other specified counseling: Secondary | ICD-10-CM | POA: Diagnosis not present

## 2020-11-26 DIAGNOSIS — I472 Ventricular tachycardia: Secondary | ICD-10-CM | POA: Diagnosis not present

## 2020-11-26 DIAGNOSIS — Z515 Encounter for palliative care: Secondary | ICD-10-CM | POA: Diagnosis not present

## 2020-11-26 DIAGNOSIS — I5043 Acute on chronic combined systolic (congestive) and diastolic (congestive) heart failure: Secondary | ICD-10-CM | POA: Diagnosis not present

## 2020-11-26 LAB — BASIC METABOLIC PANEL
Anion gap: 9 (ref 5–15)
BUN: 12 mg/dL (ref 8–23)
CO2: 23 mmol/L (ref 22–32)
Calcium: 8.4 mg/dL — ABNORMAL LOW (ref 8.9–10.3)
Chloride: 106 mmol/L (ref 98–111)
Creatinine, Ser: 1.48 mg/dL — ABNORMAL HIGH (ref 0.61–1.24)
GFR, Estimated: 50 mL/min — ABNORMAL LOW (ref >60)
Glucose, Bld: 113 mg/dL — ABNORMAL HIGH (ref 70–99)
Potassium: 3.9 mmol/L (ref 3.5–5.1)
Sodium: 138 mmol/L (ref 135–145)

## 2020-11-26 LAB — FACTOR 5 LEIDEN

## 2020-11-26 LAB — CBC
HCT: 29.2 % — ABNORMAL LOW (ref 39.0–52.0)
Hemoglobin: 9.3 g/dL — ABNORMAL LOW (ref 13.0–17.0)
MCH: 26.8 pg (ref 26.0–34.0)
MCHC: 31.8 g/dL (ref 30.0–36.0)
MCV: 84.1 fL (ref 80.0–100.0)
Platelets: 228 10*3/uL (ref 150–400)
RBC: 3.47 MIL/uL — ABNORMAL LOW (ref 4.22–5.81)
RDW: 15.5 % (ref 11.5–15.5)
WBC: 5.2 10*3/uL (ref 4.0–10.5)
nRBC: 0 % (ref 0.0–0.2)

## 2020-11-26 LAB — PROTIME-INR
INR: 1.2 (ref 0.8–1.2)
Prothrombin Time: 15 seconds (ref 11.4–15.2)

## 2020-11-26 LAB — COOXEMETRY PANEL
Carboxyhemoglobin: 1.4 % (ref 0.5–1.5)
Methemoglobin: 0.8 % (ref 0.0–1.5)
O2 Saturation: 59.4 %
Total hemoglobin: 9.5 g/dL — ABNORMAL LOW (ref 12.0–16.0)

## 2020-11-26 LAB — HEPARIN LEVEL (UNFRACTIONATED): Heparin Unfractionated: 0.51 IU/mL (ref 0.30–0.70)

## 2020-11-26 MED ORDER — POTASSIUM CHLORIDE CRYS ER 20 MEQ PO TBCR
20.0000 meq | EXTENDED_RELEASE_TABLET | Freq: Once | ORAL | Status: AC
  Administered 2020-11-26: 20 meq via ORAL
  Filled 2020-11-26: qty 1

## 2020-11-26 NOTE — Progress Notes (Signed)
Palliative:  HPI: HPI: 74 y.o.malewith past medical history of CAD s/p CABG 1991, chronic combined systolic and diastolic HCF EF 70-92%, history of VT, s/p Medtronic AICD, hypertension, hyperlipidemia, ADHD, CKD stage 3a, history of DVT, esophageal stricture, GERD, history of prostate (2012) and tonsillar (2005) cancersadmitted on 12/21/2021with cardiogenic shock and acute on chronic renal failure.EF 20% with LV thrombus, severe MR, moderate RV dysfunction. Began milrinone infusion.  I met today at [name]'s bedside. No family present today. He reports that his wife will likely come later today. He reports that he has had a good night and morning overall. We further discussed LVAD and he was unable to express to me any of the details of the procedure or care other than not being able to get in water. I further discussed the basics of pump insertion with driveline that will need to remain sterile with dressing changes to be learned along with battery changes.   We further discussed how he would feel if he were not able to have LVAD. He shares with me that he would be okay. He is hopeful he can have LVAD but he shares that he has made his peace with his death long ago. Most important to him is improvement in his quality of life. We discussed that we will await direction from heart failure team for next steps.   All questions/concerns addressed. Emotional support provided.   Exam: Alert, oriented. Sometimes forgetful. Struggles to recall information and details from providers at times. No distress. Breathing regular, unlabored. Abd flat. Moves all extremities.    Plan: - They remain hopeful for LVAD option.  - MOST and Advance Directive completed.   [place], NP Palliative Medicine Team Pager [phone] (Please see [email or web address] for schedule) Team Phone [phone]    Greater than 50%  of this time was spent counseling and coordinating care related to the above assessment  and plan

## 2020-11-26 NOTE — Progress Notes (Signed)
PROGRESS NOTE  [name] [id number] DOB: [birth year] DOA: 11/17/2020 PCP: Peter Koch, MD  Brief History   Memory Peter Morrisonis a 74 y.o.malewith medical history significant ofCAD status post CABG and PCI, chronic combined systolic and diastolic CHF/ischemic cardiomyopathy with LVEF 20-25% and history of VT status post ICD, hypertension, hyperlipidemia,CKD stage IIIa,history of prostate and tonsillar cancer, history of DVT presenting to the ED with complaints of shortness of breath.Patient reports dyspnea on exertion and cough for the past 3 to 4 days. He was having low-grade fevers earlier but has not had them for the past few days. He has also had 3 episodes of vomiting over the past week which has now resolved. Denies abdominal pain or diarrhea. States his ICD had shocked him prior to his recent hospitalization but he has not had any issues lately. He is vaccinated against Covid.   ED Course:Afebrile.Tachypneic with respiratory rate in the 20s.Not tachycardic.Not hypoxic. WBC 7.0, hemoglobin 10.3, hematocrit 33.9, platelet 186K.Sodium 140, potassium 3.7, chloride 107, bicarb 22, BUN 27, creatinine 1.7, glucose 106. High-sensitivity troponin mildly elevated but stable (29 >29).BNP significantly elevated at 3663. SARS-CoV-2 PCR test and influenza panel both negative.  Chest x-ray showing cardiomegaly and small effusions in the posterior costophrenic angles; no frank edema.Patient was given IV Lasix 20 mg.  Vascular surgery was consulted due to the patient's carotid artery stenosis, Left ICA stenosis >80%. The patient was evaluated by Dr. Donzetta [name] on 11/24/2020. He determined that if the patient were to meet criteria for LVAD placement, it would make sense to perform CEA during the same procedure. However, if the patient were not found to be a candidate for the LVAD procedure, then he would not be seen as a candidate for CEA.  Consultants   . Cardiology . Vascular Surgery . LVAD Team. . Heart Failure Team  Procedures  . None  Antibiotics   Anti-infectives (From admission, onward)   None     Subjective  The patient is resting comfortably. No new complaints.  Objective   Vitals:  Vitals:   11/26/20 0355 11/26/20 0905  BP: 117/62 (!) 120/57  Pulse: 68 70  Resp: 20 18  Temp: 98.3 F (36.8 C) 97.8 F (36.6 C)  SpO2: 95% 96%    Exam:  Constitutional:  . The patient is awake, alert, and oriented x 3. No acute distress. Respiratory:  . No increased work of breathing. . No wheezes, rales, or rhonchi . No tactile fremitus Cardiovascular:  . Regular rate and rhythm . No murmurs, ectopy, or gallups. . No lateral PMI. No thrills. Abdomen:  . Abdomen is soft, non-tender, non-distended . No hernias, masses, or organomegaly . Normoactive bowel sounds.  Musculoskeletal:  . No cyanosis, clubbing, or edema Skin:  . No rashes, lesions, ulcers . palpation of skin: no induration or nodules Neurologic:  . CN 2-12 intact . Sensation all 4 extremities intact Psychiatric:  . Mental status o Mood, affect appropriate o Orientation to person, place, time  . judgment and insight appear intact  I have personally reviewed the following:   Today's Data  . Vitals, BMP, CBC, methemeglobin,   Imaging  . CT head without contrast . Vascular ultrasound . CT Chest without contrast  Cardiology Data  . EKG . Echocardiogram (11/22/2020)  Scheduled Meds: . amiodarone  400 mg Oral BID  . aspirin EC  81 mg Oral Daily  . atorvastatin  80 mg Oral QPM  . buPROPion  300 mg Oral Daily  . Chlorhexidine Gluconate  Cloth  6 each Topical Daily  . escitalopram  20 mg Oral Daily  . feeding supplement  237 mL Oral BID BM  . mexiletine  150 mg Oral Q8H  . multivitamin with minerals  1 tablet Oral Daily  . pantoprazole  40 mg Oral QAC breakfast  . sodium chloride flush  10-40 mL Intracatheter Q12H  . sodium chloride flush  3  mL Intravenous Q12H  . zolpidem  5 mg Oral QHS   Continuous Infusions: . heparin 1,250 Units/hr (11/24/20 0842)  . milrinone 0.25 mcg/kg/min (11/26/20 0954)    Principal Problem:   VT (ventricular tachycardia) (HCC) Active Problems:   Hyperlipidemia with target LDL less than 70   Carotid artery disease (HCC)   Acute on chronic combined systolic and diastolic CHF (congestive heart failure) (HCC)   CHF exacerbation (HCC)   Acute on chronic kidney failure (HCC)   LOS: 9 days   A & P  Acute on chronic combined systolic and diastolic CHF: Patient presented with signs of fluid overload.  Chest x-ray shows cardiomegaly and small effusion in the posterior costophrenic angles. On admission the patient was found to have bibasilar rales, JVD and significantly elevated BNP of 3663. This was despite the use of milrinone drip at home. Echo from 10/24/2020 showing severely reduced ejection fraction of 20 to 123456, grade 2 diastolic dysfunction, moderate to severe MR. RHC on 11/23/2020 demonstrated mild pulmonary venous hypertension, improved volume status, and acceptable cardiac output on milrinone. The pulmonary artery pulsatility index was also found to be acceptable at 4.8. (Pulmonary artery pressure/right atrial pressure). Cardiology discontinued Lasix, Entresto, and Coreg. The patient has been continued on milrinone. Volume will be carefully monitored along with electrolytes and creatinine. Magnesium will be kept greater than 2 and potassium greater than 4. The patient is being evaluated by the VAD team, CTS and vascular surgery. Consideration is being given to CIR, but it is not clear whether this will be pursued before or after VAD.  Monomorphic ventricular tachycardia: The patient was given loading dose of amiodarone and then was placed on a drip. He has been continued on amiodarone 400 mg twice daily and mexiletine. The patient underwent recent left heart cath on 10/23/2020 without any targets for  revascularization. The assistance of cardiology and EP is appreciated.  LV thrombus: Noted on echo on 12/26. Shows 20% ejection fraction with LV thrombus.  Severe MR.  Moderate RV dysfunction. Continue IV heparin as per pharmacy.  Carotid Stenosis: Doppler demonstrated >80% stenosis of the left ICA. Vascular surgery was consulted. The patient was evaluated by Dr. Donzetta [name] on 11/24/2020. He determined that if the patient were to meet criteria for LVAD placement, it would make sense to perform CEA during the same procedure. However, if the patient were not found to be a candidate for the LVAD procedure, then he would not be seen as a candidate for CEA.  AKI on CKD stage IIIa: Kidney function improving. Creatinine today is down to 1.43. Entresto and Lasix was discontinued by cardiology. Continue to monitor kidney function closely. Repeat BMP tomorrow a.m.  Severe MR: Status post RHC on 12/27. Appreciate cardiology assistance.  History of coronary artery disease status post CABG and PCI:  Patient denies ACS symptoms.  Troponin mildly elevated but stable 29x2.  Suspected demand ischemia from decompensated CHF. Continue aspirin, and statin  Hypokalemia: Replenished.  Magnesium level: WNL.  Repeat BMP tomorrow a.m.  Hypertension: Stable.  Continue Coreg and Entresto. Monitor blood pressure closely.  Hyperlipidemia:  Continue Lipitor  GERD: Continue PPI  Hypokalemia: Resolved.  Continue potassium supplement to keep potassium above 4.  Hypomagnesemia: Resolved  Depression/anxiety: Continue Lexapro, Wellbutrin and Ambien  Hypoalbuminemia: Prealbumin: 13. Dietitian consulted.  I have seen and examined this patient myself. I have spent 35 minutes in his evaluation and care.  DVT prophylaxis: Heparin Code Status: None Family Communication: None present at bedside.  Plan of care discussed with patient in length and he verbalized understanding and agreed with it. Disposition Plan: T.B.D.  Depends upon plans for LVAD and CEA.  Consultants:   Cardiology  Procedures:  Echo Right heart cath  Antimicrobials:   None  Status is: Inpatient  Dispo: The patient is from: Home  Anticipated d/c is to: Home  Anticipated d/c date is: TBD  Patient currently is not medically stable to d/c.  Lesieli Bresee, DO [facility] Direct contact: see [email or web address]  7PM-7AM contact night coverage as above 11/26/2020, 4:42 PM  LOS: 8 days

## 2020-11-26 NOTE — Progress Notes (Addendum)
Advanced Heart Failure Rounding Note   Subjective:    Milrinone started 12/25.    ECHO: 12/26 EF 20% with LV thrombus. Severe MR. Moderate RV dysfunction. Now on heparin. No bleeding  RHC 11/23/20 on milrinone 0.25 mcg.  RA = 6 RV = 56/10 PA = 51/22 (33) PCW = 19 Thermal cardiac output/index = 5.5/2.7 Fick CO/CI = 6.2/3.0 PVR = 2.3 WU Ao sat = 94% PA sat = 59%, 61% PAPi = 4.8   Remains on milrinone 0.25  mcg. CO-OX  59%    Feels ok. No hungry. Denies SOB.    Objective:   Weight Range:  Vital Signs:   Temp:  [97.6 F (36.4 C)-98.9 F (37.2 C)] 97.8 F (36.6 C) (12/30 0905) Pulse Rate:  [65-83] 70 (12/30 0905) Resp:  [18-23] 18 (12/30 0905) BP: (106-140)/(57-77) 120/57 (12/30 0905) SpO2:  [95 %-97 %] 96 % (12/30 0905) Weight:  [78.7 kg] 78.7 kg (12/30 0355) Last BM Date: 11/24/20  Weight change: Filed Weights   11/24/20 0431 11/25/20 0451 11/26/20 0355  Weight: 80.6 kg 78.9 kg 78.7 kg    Intake/Output:   Intake/Output Summary (Last 24 hours) at 11/26/2020 1039 Last data filed at 11/26/2020 0600 Gross per 24 hour  Intake 1134 ml  Output 725 ml  Net 409 ml    CVP 5 personally checked.  Physical Exam: General:  No resp difficulty HEENT: normal Neck: supple. no JVD. Carotids 2+ bilat; no bruits. No lymphadenopathy or thryomegaly appreciated. Cor: PMI nondisplaced. Regular rate & rhythm. No rubs, gallops or murmurs. Lungs: clear Abdomen: soft, nontender, nondistended. No hepatosplenomegaly. No bruits or masses. Good bowel sounds. Extremities: no cyanosis, clubbing, rash, edema. RUE PICC  Neuro: alert & orientedx3, cranial nerves grossly intact. moves all 4 extremities w/o difficulty. Affect pleasant  Telemetry:  NSR 60-70s   Labs: Basic Metabolic Panel: Recent Labs  Lab 11/21/20 0242 11/22/20 0539 11/23/20 0612 11/23/20 0754 11/24/20 0450 11/25/20 0525 11/26/20 0520  NA 138 138 139 143  144 138 136 138  K 4.0 3.2* 4.0 4.1  3.9 4.2 4.4  3.9  CL 103 102 106  --  107 105 106  CO2 23 25 23   --  23 22 23   GLUCOSE 98 85 93  --  91 164* 113*  BUN 27* 23 17  --  11 10 12   CREATININE 1.96* 1.81* 1.53*  --  1.50* 1.43* 1.48*  CALCIUM 8.4* 8.3* 8.2*  --  8.4* 9.1 8.4*  MG 1.9 2.0 1.8  --  2.2 2.0  --     Liver Function Tests: Recent Labs  Lab 11/24/20 0450  AST 40  ALT 127*  ALKPHOS 110  BILITOT 1.5*  PROT 5.5*  ALBUMIN 2.9*   No results for input(s): LIPASE, AMYLASE in the last 168 hours. No results for input(s): AMMONIA in the last 168 hours.  CBC: Recent Labs  Lab 11/23/20 0612 11/23/20 0754 11/24/20 0450 11/25/20 0525 11/26/20 0520  WBC 5.8  --  5.3 5.9 5.2  NEUTROABS  --   --  3.3  --   --   HGB 9.5* 10.9*  10.2* 9.8* 9.6* 9.3*  HCT 30.2* 32.0*  30.0* 31.0* 31.8* 29.2*  MCV 84.1  --  85.4 85.7 84.1  PLT 232  --  244 237 228    Cardiac Enzymes: No results for input(s): CKTOTAL, CKMB, CKMBINDEX, TROPONINI in the last 168 hours.  BNP: BNP (last 3 results) Recent Labs    11/17/20 1645  BNP 3,663.4*    ProBNP (last 3 results) No results for input(s): PROBNP in the last 8760 hours.    Other results:  Imaging: CT HEAD WO CONTRAST  Result Date: 11/24/2020 CLINICAL DATA:  Memory loss. EXAM: CT HEAD WITHOUT CONTRAST TECHNIQUE: Contiguous axial images were obtained from the base of the skull through the vertex without intravenous contrast. COMPARISON:  Report from remote head and sinus CT 2001. FINDINGS: Brain: Generalized cerebral atrophy. Minor periventricular and deep white matter hypodensity typical of chronic small vessel ischemia, typical for age. No acute or prior infarct. No hemorrhage. No hydrocephalus. The basilar cisterns are patent. Gray-white differentiation is preserved. Vascular: Atherosclerosis of skullbase vasculature without hyperdense vessel or abnormal calcification. Skull: No fracture or focal lesion. Sinuses/Orbits: Opacification of right maxillary sinus with heterogeneous  density and air-fluid level. There is circumferential mucosal thickening. Mild cortical thickening. Scattered opacification of ethmoid air cells and right frontal sinus. The mastoid air cells are clear. Orbits are unremarkable. Other: None. IMPRESSION: 1. Generalized cerebral atrophy and mild chronic small vessel ischemia. No acute intracranial abnormality. 2. Chronic right maxillary sinusitis with heterogeneous contents. Electronically Signed   By: Keith Rake M.D.   On: 11/24/2020 17:08   CT CHEST WO CONTRAST  Result Date: 11/26/2020 CLINICAL DATA:  Aortic disease. Preoperative left ventricular assist device implant EXAM: CT CHEST WITHOUT CONTRAST TECHNIQUE: Multidetector CT imaging of the chest was performed following the standard protocol without IV contrast. COMPARISON:  November 23, 2020 FINDINGS: Cardiovascular: There is no appreciable thoracic aortic aneurysm. No evident intramural hematoma. There are foci of aortic atherosclerosis. There are scattered foci of calcification in proximal visualized great vessels. There are multiple foci of coronary artery calcification. Pacemaker lead attached to the right ventricle. There is stable left atrial and left ventricular prominence, unchanged from recent study. There is no pericardial effusion or pericardial thickening. Mediastinum/Nodes: Thyroid appears unremarkable. No demonstrable adenopathy. No appreciable esophageal lesions. Lungs/Pleura: Small pleural effusions bilaterally, essentially stable compared to recent study. There is atelectatic change in the right base. There is no edema or airspace opacity. Mild scarring noted in the left upper lobe anteriorly. No pneumothorax. Trachea and major bronchial structures appear unremarkable. Upper Abdomen: There is cholelithiasis. Gallbladder wall does not appear thickened. There is upper abdominal aortic atherosclerosis. Visualized upper abdominal structures otherwise appear normal. Musculoskeletal: Status  post previous median sternotomy. Degenerative change noted throughout the thoracic spine. No blastic or lytic bone lesions. Pacemaker device present anteriorly on the left. IMPRESSION: 1. Status post median sternotomy with pacemaker lead attached to the right ventricle, unchanged. Stable left-sided cardiac prominence. No pericardial effusion. There are foci of aortic atherosclerosis as well as foci of great vessel and coronary artery calcification. No thoracic aortic aneurysm. 2. Stable small pleural effusions bilaterally. No edema or airspace opacity. Right base atelectasis noted. 3.  No evident adenopathy. 4.  Cholelithiasis. Aortic Atherosclerosis (ICD10-I70.0). Electronically Signed   By: Lowella Grip III M.D.   On: 11/26/2020 08:49   VAS US DOPPLER PRE VAD  Result Date: 11/24/2020 PERIOPERATIVE VASCULAR EVALUATION Indications:            Pre-VAD workup. Risk Factors:           Hypertension, hyperlipidemia, coronary artery disease. Vascular Interventions: Hx of CABG. Performing Technologist: Darlin Coco RDMS  Examination Guidelines: A complete evaluation includes B-mode imaging, spectral Doppler, color Doppler, and power Doppler as needed of all accessible portions of each vessel. Bilateral testing is considered an integral part of a complete  examination. Limited examinations for reoccurring indications may be performed as noted.  Right Carotid Findings: +----------+--------+--------+--------+---------------------------+--------+           PSV cm/sEDV cm/sStenosisDescribe                   Comments +----------+--------+--------+--------+---------------------------+--------+ CCA Prox  87      21                                                  +----------+--------+--------+--------+---------------------------+--------+ CCA Distal73      22              heterogenous                        +----------+--------+--------+--------+---------------------------+--------+ ICA Prox  61       17      1-39%   heterogenous and hypoechoic         +----------+--------+--------+--------+---------------------------+--------+ ICA Distal58      22                                                  +----------+--------+--------+--------+---------------------------+--------+ ECA       103     11                                                  +----------+--------+--------+--------+---------------------------+--------+ Portions of this table do not appear on this page. +----------+--------+-------+----------------+------------+           PSV cm/sEDV cmsDescribe        Arm Pressure +----------+--------+-------+----------------+------------+ Subclavian108            Multiphasic, WNL             +----------+--------+-------+----------------+------------+ +---------+--------+--+--------+--+---------+ VertebralPSV cm/s34EDV cm/s12Antegrade +---------+--------+--+--------+--+---------+ Left Carotid Findings: +----------+--------+--------+--------+-------------------+--------------------+           PSV cm/sEDV cm/sStenosisDescribe           Comments             +----------+--------+--------+--------+-------------------+--------------------+ CCA Prox  106     27              hyperechoic and                                                           heterogenous                            +----------+--------+--------+--------+-------------------+--------------------+ CCA Mid   159     40              heterogenous and  hypoechoic                              +----------+--------+--------+--------+-------------------+--------------------+ CCA Distal182     34              heterogenous                            +----------+--------+--------+--------+-------------------+--------------------+ ICA Prox  302     102     80-99%  heterogenous and   Velocities may                                          hypoechoic         underestimate degree                                                      of stenosis due to                                                        more proximal                                                             obstruction.         +----------+--------+--------+--------+-------------------+--------------------+ ICA Mid   281     113     80-99%  heterogenous                            +----------+--------+--------+--------+-------------------+--------------------+ ICA Distal137     40                                                      +----------+--------+--------+--------+-------------------+--------------------+ ECA       87      11                                                      +----------+--------+--------+--------+-------------------+--------------------+ +----------+--------+--------+----------------+------------+ SubclavianPSV cm/sEDV cm/sDescribe        Arm Pressure +----------+--------+--------+----------------+------------+           152             Multiphasic, [id number]          +----------+--------+--------+----------------+------------+ +---------+--------+--+--------+--+---------+ VertebralPSV cm/s36EDV cm/s14Antegrade +---------+--------+--+--------+--+---------+  ABI Findings: +--------+------------------+-----+---------+----------------------------------+ Right   Rt Pressure (mmHg)IndexWaveform Comment                            +--------+------------------+-----+---------+----------------------------------+  Brachial                       triphasicUnable to obtain pressure due to                                           bandaging/IV                       +--------+------------------+-----+---------+----------------------------------+ PTA     152               1.21 triphasic                                    +--------+------------------+-----+---------+----------------------------------+ DP      156               1.24 triphasic                                   +--------+------------------+-----+---------+----------------------------------+ +--------+------------------+-----+---------+-------+ Left    Lt Pressure (mmHg)IndexWaveform Comment +--------+------------------+-----+---------+-------+ [id number]                                     +--------+------------------+-----+---------+-------+ PTA     157               1.25 triphasic        +--------+------------------+-----+---------+-------+ DP      158               1.25 triphasic        +--------+------------------+-----+---------+-------+  Summary: Right Carotid: Velocities in the right ICA are consistent with a 1-39% stenosis. Left Carotid: Velocities in the left ICA are consistent with a 80-99% stenosis. Vertebrals:  Bilateral vertebral arteries demonstrate antegrade flow. Subclavians: Normal flow hemodynamics were seen in bilateral subclavian              arteries.  *See table(s) above for measurements and observations. Right ABI: Resting right ankle-brachial index is within normal range. No evidence of significant right lower extremity arterial disease. Left ABI: Resting left ankle-brachial index is within normal range. No evidence of significant left lower extremity arterial disease.  Electronically signed by Servando Snare MD on 11/24/2020 at 2:19:14 PM.    Final    VAS [place] LOWER EXTREMITY VENOUS (DVT)  Result Date: 11/24/2020  Lower Venous DVT Study Indications: Pre-VAD workup.  Anticoagulation: Heparin. Comparison Study: No prior studies. Performing Technologist: Darlin Coco RDMS  Examination Guidelines: A complete evaluation includes B-mode imaging, spectral Doppler, color Doppler, and power Doppler as needed of all accessible portions of each vessel. Bilateral testing is considered an integral part of a complete  examination. Limited examinations for reoccurring indications may be performed as noted. The reflux portion of the exam is performed with the patient in reverse Trendelenburg.  +---------+---------------+---------+-----------+----------+--------------+ RIGHT    CompressibilityPhasicitySpontaneityPropertiesThrombus Aging +---------+---------------+---------+-----------+----------+--------------+ CFV      Full           Yes      Yes                                 +---------+---------------+---------+-----------+----------+--------------+  SFJ      Full                                                        +---------+---------------+---------+-----------+----------+--------------+ FV Prox  Full                                                        +---------+---------------+---------+-----------+----------+--------------+ FV Mid   Full                                                        +---------+---------------+---------+-----------+----------+--------------+ FV DistalFull                                                        +---------+---------------+---------+-----------+----------+--------------+ PFV      Full                                                        +---------+---------------+---------+-----------+----------+--------------+ POP      Full           Yes      Yes                                 +---------+---------------+---------+-----------+----------+--------------+ PTV      Full                                                        +---------+---------------+---------+-----------+----------+--------------+ PERO     Full                                                        +---------+---------------+---------+-----------+----------+--------------+   +---------+---------------+---------+-----------+----------+--------------+ LEFT     CompressibilityPhasicitySpontaneityPropertiesThrombus Aging  +---------+---------------+---------+-----------+----------+--------------+ CFV      Full           Yes      Yes                                 +---------+---------------+---------+-----------+----------+--------------+ SFJ      Full                                                        +---------+---------------+---------+-----------+----------+--------------+  FV Prox  Full                                                        +---------+---------------+---------+-----------+----------+--------------+ FV Mid   Full                                                        +---------+---------------+---------+-----------+----------+--------------+ FV DistalFull                                                        +---------+---------------+---------+-----------+----------+--------------+ PFV      Full                                                        +---------+---------------+---------+-----------+----------+--------------+ POP      Full           Yes      Yes                                 +---------+---------------+---------+-----------+----------+--------------+ PTV      Full                                                        +---------+---------------+---------+-----------+----------+--------------+ PERO     Full                                                        +---------+---------------+---------+-----------+----------+--------------+     Summary: RIGHT: - There is no evidence of deep vein thrombosis in the lower extremity.  - A cystic structure is found in the popliteal fossa.  LEFT: - There is no evidence of deep vein thrombosis in the lower extremity.  - No cystic structure found in the popliteal fossa.  *See table(s) above for measurements and observations. Electronically signed by Servando Snare MD on 11/24/2020 at 2:19:44 PM.    Final      Medications:     Scheduled Medications: . amiodarone  400 mg Oral BID  .  aspirin EC  81 mg Oral Daily  . atorvastatin  80 mg Oral QPM  . buPROPion  300 mg Oral Daily  . Chlorhexidine Gluconate Cloth  6 each Topical Daily  . escitalopram  20 mg Oral Daily  . feeding supplement  237 mL Oral BID BM  . mexiletine  150 mg Oral Q8H  . multivitamin with minerals  1 tablet Oral Daily  . pantoprazole  40 mg Oral  QAC breakfast  . sodium chloride flush  10-40 mL Intracatheter Q12H  . sodium chloride flush  3 mL Intravenous Q12H  . zolpidem  5 mg Oral QHS    Infusions: . heparin 1,250 Units/hr (11/24/20 0842)  . 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 Severe MR. Moderate RV dysfunction. Now on heparin. No bleeding - NYHA IV.  Low output physiology confirmed Co-ox (12/25) 44%. - RHC 11/24/20 on RA 6, PCWP 19, CO 6.2/3.0  - Milrinone started on 12/25. Cath was on milrinone 0.25 mcg with stable hemodynamcis on cath.  - Todays CO-OX  59%  on milrinone 0.25 mcg   - Volume status stable. CVP 5.  - Off b-blocker with shock - Off Entresto with worsening renal function.  - Hold off on losartan tomorrow.  - RV has moderate dysfunction but CVP low.  -CT surgery consulted. VAD coordinators following.  -   2.Monomorphic ventricular tachycardia -No further recurrence. ICD VT zones have been adjusted. Continue amiodarone.  - Keep K> 4.0 Mg > 2.0 -Recent left heart catheterization on 10/23/2020 without any targets for revascularization - No VT.  - Cut back amio to 200 mg twice a day + mexiletine 150 mg twice a day.    3.CAD status post CABG -Recent left heart cath with severe three-vessel CAD. -He has patent vein  grafts. (LIMA -> LAD, SVG-> RCA, SVG -> OM2 -> OM3) -No chest pain.  -Continue aspirin and Lipitor  4.Severe mitral regurgitation - pending VAD  5. AKI  - due to cardiorenal/ATN - baseline 1.1 - 1.5 today.   6. Hypokalemia - K stable.   7. Apical LV mural thrombus - heparin started. ? Coumadin start .  - INR 1.2   8. Hypoalbuminemia.  Prealbumin 13 .  Consult dietitian   9. Carotid Disease >80% LICA stenosis on pre-surgical carotid dopplers.  VVS consulted.   Final intervention pending VAD work up.   10. Deconditioning PT following-- Concerned about his safety. Difficulty standing. Had falls prior to admit.  Consult CIR.   11. Memory Issues  CT head with mild small vessel disease and cerebral atrophy.   Need to determine when to start coumadin.  Length of Stay: 9   Amy Clegg NP-C  11/26/2020, 10:39 AM  Advanced Heart Failure Team Pager [phone] (M-F; 7a - 4p)  Please contact [facility] for night-coverage after hours (4p -7a ) and weekends on [email or web address]  Patient seen with NP, agree with the above note.   Stable today, co-ox 59% with CVP 5 on milrinone 0.25. He is not on a diuretic currently.   On heparin gtt with LV thrombus.   He is on both amiodarone and mexiletine with history of VT.   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   General: NAD Neck: No JVD, no thyromegaly or thyroid nodule.  Lungs: Clear to auscultation bilaterally with normal respiratory effort. CV: Nondisplaced PMI.  Heart regular S1/S2, no S3/S4, 2/6 HSM apex.  No peripheral edema.   Abdomen: Soft, nontender, no hepatosplenomegaly, no distention.  Skin: Intact without lesions or rashes.  Neurologic: Alert and oriented x 3.  Psych: Normal affect. Extremities: No clubbing or cyanosis.  HEENT:  Normal.   Patient is inotrope dependent, continue milrinone 0.25. Does not need Lasix currently, continue to follow CVP.   Continue anticoagulation for  LV thrombus.   Extensive CAD, no interventional option.  Of note, he has [id number] LICA stenosis on pre-surgical carotid dopplers. VVS has seen, if gets LVAD will need to fix concomitantly.   Working up for LVAD.  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  He required significant assistance standing up.  ?If he needs inpatient rehab prior to surgery.  Will discuss with team.   Loralie Champagne 11/26/2020 1:55 PM

## 2020-11-26 NOTE — Progress Notes (Signed)
  Mobility Specialist Criteria Algorithm Info.   Mobility Team: HOB elevated: Activity: Ambulated in hall; Dangled on edge of bed Range of motion: Active; All extremities Level of assistance: Minimal assist, patient does 75% or more Assistive device: Front wheel walker Minutes sitting in chair:  Minutes stood: 7 minutes Minutes ambulated: 7 minutes Distance ambulated (ft): 200 ft Mobility response: Tolerated well Bed Position: Semi-fowlers  Patient agreeable to participate in mobility this afternoon. Required minimal HHA with cues to shift weight over toes to prevent posterior lean and for hand placement on RW. Ambulated in hallway 200 feet at min guard with steady gait. Had LOB x1 while turning requiring min assist to steady but was able to regain balance once steady. Upon returning to room pt declined to in recliner but agreed to dangle EOB. Overall pt tolerated ambulation well without incident and is now sitting EOB with all needs met.   11/26/2020 2:53 PM

## 2020-11-26 NOTE — Plan of Care (Signed)
  Problem: Clinical Measurements: Goal: Respiratory complications will improve Outcome: Progressing Goal: Cardiovascular complication will be avoided Outcome: Progressing   

## 2020-11-26 NOTE — Progress Notes (Signed)
   Noted plans for valve placement next Wednesday.  We will plan for left carotid endarterectomy concomitantly.  We will discuss again with patient and his family early next week.  Vascular surgeon on-call available as needed over the weekend.  Parker Wherley C. [name], MD [facility] Office: [phone] Pager: [phone]

## 2020-11-26 NOTE — Progress Notes (Signed)
Inpatient Rehabilitation-Admissions Coordinator   CIR consult received. Noted pt undergoing workup for LVAD and there is still a question as to whether or not this patient will need CIR prior to surgery. Will continue to follow to determine if CIR is needed at this time.   Cheri Rous, OTR/L  Rehab Admissions Coordinator  [phone] 11/26/2020 2:59 PM

## 2020-11-26 NOTE — Progress Notes (Signed)
ANTICOAGULATION CONSULT NOTE  Pharmacy Consult for heparin Indication: LV thrombus 12/26   Allergies  Allergen Reactions  . Other Anaphylaxis    Insect bites (Has EpiPen)    Patient Measurements: Height: 6' (182.9 cm) Weight: 78.7 kg (173 lb 8 oz) IBW/kg (Calculated) : 77.6 Heparin Dosing Weight: 82kg  Vital Signs: Temp: 97.8 F (36.6 C) (12/30 0905) Temp Source: Oral (12/30 0905) BP: 120/57 (12/30 0905) Pulse Rate: 70 (12/30 0905)  Labs: Recent Labs    11/23/20 1622 11/23/20 1930 11/24/20 0450 11/25/20 0525 11/25/20 0629 11/26/20 0520  HGB  --    < > 9.8* 9.6*  --  9.3*  HCT  --   --  31.0* 31.8*  --  29.2*  PLT  --   --  244 237  --  228  LABPROT 14.2  --   --   --   --  15.0  INR 1.1  --   --   --   --  1.2  HEPARINUNFRC  --    < > 0.36  --  0.37 0.51  CREATININE  --   --  1.50* 1.43*  --  1.48*   < > = values in this interval not displayed.    Estimated Creatinine Clearance: 48.8 mL/min (A) (by C-G formula based on SCr of 1.48 mg/dL (H)).   Medical History: Past Medical History:  Diagnosis Date  . Adult ADHD   . AICD (automatic cardioverter/defibrillator) present   . CAD (coronary artery disease)    Cath 2008, all grafts patent // Cath 2017 - all grafts patent   . 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 Carotid artery disease (HCC)    Doppler, February, 2012, mild plaque, 0-39% bilateral, plan followup in one year  . Dyspepsia   . Ejection fraction < 50%   . Elevated PSA 02/15/2011  . Esophageal stricture   . GERD (gastroesophageal reflux disease)   . HTN (hypertension)   . Hx of CABG    1991  . Hyperlipidemia   . 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 Malignant neoplasm of tonsil (HCC)   . Myocardial infarction (HCC) 1998  . Prostate cancer (HCC) 2012   IMRT, ADT  . Thrombosis of  arm    Left  . Venous thrombosis    Left arm      Assessment: 74 yo M with large laminated mural apical thrombus found on TTE 11/22/20, originally admitted for HF exacerbation. Pharmacy consulted for IV heparin. No AC PTA.   Heparin level this morning came back therapeutic at 0.51, on 1250 units/hr. Hgb 9.3, plt 228. No s/sx of bleeding or infusion issues.   Goal of Therapy:  Heparin level 0.3-0.7 units/ml Monitor platelets by anticoagulation protocol: Yes   Plan:  Continue heparin infusion to 1250 units/hr Monitor daily HL, CBC/plt Monitor for signs/symptoms of bleeding  F/u long term anticoagulation plan  Sherron Monday, PharmD, BCCCP Clinical Pharmacist  Phone: [phone] 11/26/2020 10:40 AM  Please check AMION for all [facility] Pharmacy phone numbers After 10:00 PM, call Main Pharmacy [phone]

## 2020-11-27 DIAGNOSIS — I5043 Acute on chronic combined systolic (congestive) and diastolic (congestive) heart failure: Secondary | ICD-10-CM | POA: Diagnosis not present

## 2020-11-27 DIAGNOSIS — I472 Ventricular tachycardia: Secondary | ICD-10-CM | POA: Diagnosis not present

## 2020-11-27 LAB — CBC
HCT: 30.2 % — ABNORMAL LOW (ref 39.0–52.0)
Hemoglobin: 9.1 g/dL — ABNORMAL LOW (ref 13.0–17.0)
MCH: 25.9 pg — ABNORMAL LOW (ref 26.0–34.0)
MCHC: 30.1 g/dL (ref 30.0–36.0)
MCV: 85.8 fL (ref 80.0–100.0)
Platelets: 234 10*3/uL (ref 150–400)
RBC: 3.52 MIL/uL — ABNORMAL LOW (ref 4.22–5.81)
RDW: 15.5 % (ref 11.5–15.5)
WBC: 6.6 10*3/uL (ref 4.0–10.5)
nRBC: 0 % (ref 0.0–0.2)

## 2020-11-27 LAB — BASIC METABOLIC PANEL
Anion gap: 9 (ref 5–15)
BUN: 11 mg/dL (ref 8–23)
CO2: 24 mmol/L (ref 22–32)
Calcium: 8.7 mg/dL — ABNORMAL LOW (ref 8.9–10.3)
Chloride: 107 mmol/L (ref 98–111)
Creatinine, Ser: 1.38 mg/dL — ABNORMAL HIGH (ref 0.61–1.24)
GFR, Estimated: 54 mL/min — ABNORMAL LOW (ref >60)
Glucose, Bld: 108 mg/dL — ABNORMAL HIGH (ref 70–99)
Potassium: 4.1 mmol/L (ref 3.5–5.1)
Sodium: 140 mmol/L (ref 135–145)

## 2020-11-27 LAB — COOXEMETRY PANEL
Carboxyhemoglobin: 1.4 % (ref 0.5–1.5)
Methemoglobin: 0.6 % (ref 0.0–1.5)
O2 Saturation: 61.8 %
Total hemoglobin: 9.3 g/dL — ABNORMAL LOW (ref 12.0–16.0)

## 2020-11-27 LAB — HEPARIN LEVEL (UNFRACTIONATED): Heparin Unfractionated: 0.58 IU/mL (ref 0.30–0.70)

## 2020-11-27 MED ORDER — AMIODARONE HCL 200 MG PO TABS
200.0000 mg | ORAL_TABLET | Freq: Two times a day (BID) | ORAL | Status: DC
  Administered 2020-11-27 – 2020-12-05 (×16): 200 mg via ORAL
  Filled 2020-11-27 (×16): qty 1

## 2020-11-27 NOTE — Progress Notes (Signed)
PROGRESS NOTE  [name] [id number] DOB: Dec 16, 1945 DOA: 11/17/2020 PCP: Hoyt Koch, MD  Brief History   [name] a 74 y.o.malewith medical history significant ofCAD status post CABG and PCI, chronic combined systolic and diastolic CHF/ischemic cardiomyopathy with LVEF 20-25% and history of VT status post ICD, hypertension, hyperlipidemia,CKD stage IIIa,history of prostate and tonsillar cancer, history of DVT presenting to the ED with complaints of shortness of breath.Patient reports dyspnea on exertion and cough for the past 3 to 4 days. He was having low-grade fevers earlier but has not had them for the past few days. He has also had 3 episodes of vomiting over the past week which has now resolved. Denies abdominal pain or diarrhea. States his ICD had shocked him prior to his recent hospitalization but he has not had any issues lately. He is vaccinated against Covid.   ED Course:Afebrile.Tachypneic with respiratory rate in the 20s.Not tachycardic.Not hypoxic. WBC 7.0, hemoglobin 10.3, hematocrit 33.9, platelet 186K.Sodium 140, potassium 3.7, chloride 107, bicarb 22, BUN 27, creatinine 1.7, glucose 106. High-sensitivity troponin mildly elevated but stable (29 >29).BNP significantly elevated at 3663. SARS-CoV-2 PCR test and influenza panel both negative.  Chest x-ray showing cardiomegaly and small effusions in the posterior costophrenic angles; no frank edema.Patient was given IV Lasix 20 mg.  Vascular surgery was consulted due to the patient's carotid artery stenosis, Left ICA stenosis >80%. The patient was evaluated by Dr. Donzetta Matters on 11/24/2020. He determined that if the patient were to meet criteria for LVAD placement, it would make sense to perform CEA during the same procedure. However, if the patient were not found to be a candidate for the LVAD procedure, then he would not be seen as a candidate for CEA.  On 11/27/2020 the patient  was able to ambulate down the hall without difficulty on room air maintaining SaO2 of 92%.   Consultants  . Cardiology . Vascular Surgery . LVAD Team. . Heart Failure Team  Procedures  . None  Antibiotics   Anti-infectives (From admission, onward)   None     Subjective  The patient is resting comfortably. No new complaints.  Objective   Vitals:  Vitals:   11/27/20 0357 11/27/20 0729  BP: 114/69 125/71  Pulse: 70 71  Resp: 17 18  Temp: 98.6 F (37 C) 97.9 F (36.6 C)  SpO2: 96% 96%    Exam:  Constitutional:  . The patient is awake, alert, and oriented x 3. No acute distress. Respiratory:  . No increased work of breathing. . No wheezes, rales, or rhonchi . No tactile fremitus Cardiovascular:  . Regular rate and rhythm . No murmurs, ectopy, or gallups. . No lateral PMI. No thrills. Abdomen:  . Abdomen is soft, non-tender, non-distended . No hernias, masses, or organomegaly . Normoactive bowel sounds.  Musculoskeletal:  . No cyanosis, clubbing, or edema Skin:  . No rashes, lesions, ulcers . palpation of skin: no induration or nodules Neurologic:  . CN 2-12 intact . Sensation all 4 extremities intact Psychiatric:  . Mental status o Mood, affect appropriate o Orientation to person, place, time  . judgment and insight appear intact  I have personally reviewed the following:   Today's Data  . Vitals, BMP, CBC, methemeglobin,   Imaging  . CT head without contrast . Vascular ultrasound . CT Chest without contrast  Cardiology Data  . EKG . Echocardiogram (11/22/2020)  Scheduled Meds: . amiodarone  200 mg Oral BID  . aspirin EC  81 mg Oral  Daily  . atorvastatin  80 mg Oral QPM  . buPROPion  300 mg Oral Daily  . Chlorhexidine Gluconate Cloth  6 each Topical Daily  . escitalopram  20 mg Oral Daily  . feeding supplement  237 mL Oral BID BM  . mexiletine  150 mg Oral Q8H  . multivitamin with minerals  1 tablet Oral Daily  . pantoprazole  40 mg  Oral QAC breakfast  . sodium chloride flush  10-40 mL Intracatheter Q12H  . sodium chloride flush  3 mL Intravenous Q12H  . zolpidem  5 mg Oral QHS   Continuous Infusions: . heparin 1,250 Units/hr (11/26/20 2201)  . milrinone 0.25 mcg/kg/min (11/27/20 0522)    Principal Problem:   VT (ventricular tachycardia) (HCC) Active Problems:   Hyperlipidemia with target LDL less than 70   Carotid artery disease (HCC)   Acute on chronic combined systolic and diastolic CHF (congestive heart failure) (HCC)   CHF exacerbation (HCC)   Acute on chronic kidney failure (HCC)   LOS: 10 days   A & P  Acute on chronic combined systolic and diastolic CHF: Patient presented with signs of fluid overload.  Chest x-ray shows cardiomegaly and small effusion in the posterior costophrenic angles. On admission the patient was found to have bibasilar rales, JVD and significantly elevated BNP of 3663. This was despite the use of milrinone drip at home. Echo from 10/24/2020 showing severely reduced ejection fraction of 20 to 123456, grade 2 diastolic dysfunction, moderate to severe MR. RHC on 11/23/2020 demonstrated mild pulmonary venous hypertension, improved volume status, and acceptable cardiac output on milrinone. The pulmonary artery pulsatility index was also found to be acceptable at 4.8. (Pulmonary artery pressure/right atrial pressure). Cardiology discontinued Lasix, Entresto, and Coreg. The patient has been continued on milrinone. Volume will be carefully monitored along with electrolytes and creatinine. Magnesium will be kept greater than 2 and potassium greater than 4. The patient is being evaluated by the VAD team, CTS and vascular surgery. Consideration is being given to CIR, but it is not clear whether this will be pursued before or after VAD.  Monomorphic ventricular tachycardia: The patient was given loading dose of amiodarone and then was placed on a drip. He has been continued on amiodarone 400 mg twice  daily and mexiletine, although today that has been reduced to 400 mg amiodarone daily and mexiletine 150 mg bid. The patient underwent recent left heart cath on 10/23/2020 without any targets for revascularization. The assistance of cardiology and EP is appreciated.  LV thrombus: Noted on echo on 12/26. Shows 20% ejection fraction with LV thrombus.  Severe MR.  Moderate RV dysfunction. Continue IV heparin as per pharmacy. He has not been converted to coumadin due to possible VAD.  Carotid Stenosis: Doppler demonstrated >80% stenosis of the left ICA. Vascular surgery was consulted. The patient was evaluated by Dr. Donzetta Matters on 11/24/2020. He determined that if the patient were to meet criteria for LVAD placement, it would make sense to perform CEA during the same procedure. However, if the patient were not found to be a candidate for the LVAD procedure, then he would not be seen as a candidate for CEA.  AKI on CKD stage IIIa: Kidney function improving. Creatinine today is down to 1.44. Baseline creatinine is 1.1. Entresto and Lasix was discontinued by cardiology. Continue to monitor creatinine, electrolytes, and volume status. Avoid nephrotoxic substances and hypotension.  Severe MR: Status post RHC on 12/27. Appreciate cardiology assistance.  History of coronary artery  disease status post CABG and PCI:  Patient denies ACS symptoms.  Troponin mildly elevated but stable 29x2.  Suspected demand ischemia from decompensated CHF. Continue aspirin, and statin  Hypokalemia: Resolved. Monitor.  Hypomagnesemia: Resolved  Hypertension: Stable.  Continue Coreg and Entresto. Monitor blood pressure closely.  Hyperlipidemia: Continue Lipitor  GERD: Continue PPI  Depression/anxiety: Continue Lexapro, Wellbutrin and Ambien  Hypoalbuminemia: Prealbumin: 13. Dietitian consulted.  I have seen and examined this patient myself. I have spent 32 minutes in his evaluation and care.  DVT prophylaxis:  Heparin Code Status: None Family Communication: None available. Disposition Plan: T.B.D. Depends upon plans for LVAD and CEA.  Status is: Inpatient  Dispo: The patient is from: Home  Anticipated d/c is to: Home  Anticipated d/c date is: TBD  Patient currently is not medically stable to d/c.  Colene Mines, DO [facility] Direct contact: see [email or web address]  7PM-7AM contact night coverage as above 11/27/2020, 4:00 PM  LOS: 8 days

## 2020-11-27 NOTE — Progress Notes (Addendum)
Advanced Heart Failure Rounding Note   Subjective:    Milrinone started 12/25.    ECHO: 12/26 EF 20% with LV thrombus. Severe MR. Moderate RV dysfunction. Now on heparin. No bleeding  RHC 11/23/20 on milrinone 0.25 mcg.  RA = 6 RV = 56/10 PA = 51/22 (33) PCW = 19 Thermal cardiac output/index = 5.5/2.7 Fick CO/CI = 6.2/3.0 PVR = 2.3 WU Ao sat = 94% PA sat = 59%, 61% PAPi = 4.8   Remains on milrinone 0.25  mcg. CO-OX  62%.    Walked all the way down the hall today. Felt ok. Denies SOB.    Objective:   Weight Range:  Vital Signs:   Temp:  [97.9 F (36.6 C)-98.7 F (37.1 C)] 97.9 F (36.6 C) (12/31 0729) Pulse Rate:  [70-75] 71 (12/31 0729) Resp:  [17-18] 18 (12/31 0729) BP: (114-125)/(63-71) 125/71 (12/31 0729) SpO2:  [94 %-98 %] 96 % (12/31 0729) Weight:  [77.7 kg] 77.7 kg (12/31 0357) Last BM Date: 11/25/20  Weight change: Filed Weights   11/25/20 0451 11/26/20 0355 11/27/20 0357  Weight: 78.9 kg 78.7 kg 77.7 kg    Intake/Output:   Intake/Output Summary (Last 24 hours) at 11/27/2020 1026 Last data filed at 11/27/2020 0900 Gross per 24 hour  Intake 1132.56 ml  Output 550 ml  Net 582.56 ml    CVP 5 personally checked.  Physical Exam: General:   No resp difficulty. Sitting in the chair  HEENT: normal Neck: supple. no JVD. Carotids 2+ bilat; no bruits. No lymphadenopathy or thryomegaly appreciated. Cor: PMI nondisplaced. Regular rate & rhythm. No rubs, gallops or murmurs. Lungs: clear Abdomen: soft, nontender, nondistended. No hepatosplenomegaly. No bruits or masses. Good bowel sounds. Extremities: no cyanosis, clubbing, rash, edema. RUE PICC  Neuro: alert & orientedx3, cranial nerves grossly intact. moves all 4 extremities w/o difficulty. Affect pleasant  Telemetry:  NSR 60-70s personally checked.  Labs: Basic Metabolic Panel: Recent Labs  Lab 11/21/20 0242 11/22/20 0539 11/23/20 0612 11/23/20 0754 11/24/20 0450 11/25/20 0525  11/26/20 0520 11/27/20 0520  NA 138 138 139 143  144 138 136 138 140  K 4.0 3.2* 4.0 4.1  3.9 4.2 4.4 3.9 4.1  CL 103 102 106  --  107 105 106 107  CO2 23 25 23   --  23 22 23 24   GLUCOSE 98 85 93  --  91 164* 113* 108*  BUN 27* 23 17  --  11 10 12 11   CREATININE 1.96* 1.81* 1.53*  --  1.50* 1.43* 1.48* 1.38*  CALCIUM 8.4* 8.3* 8.2*  --  8.4* 9.1 8.4* 8.7*  MG 1.9 2.0 1.8  --  2.2 2.0  --   --     Liver Function Tests: Recent Labs  Lab 11/24/20 0450  AST 40  ALT 127*  ALKPHOS 110  BILITOT 1.5*  PROT 5.5*  ALBUMIN 2.9*   No results for input(s): LIPASE, AMYLASE in the last 168 hours. No results for input(s): AMMONIA in the last 168 hours.  CBC: Recent Labs  Lab 11/23/20 0612 11/23/20 0754 11/24/20 0450 11/25/20 0525 11/26/20 0520 11/27/20 0520  WBC 5.8  --  5.3 5.9 5.2 6.6  NEUTROABS  --   --  3.3  --   --   --   HGB 9.5* 10.9*  10.2* 9.8* 9.6* 9.3* 9.1*  HCT 30.2* 32.0*  30.0* 31.0* 31.8* 29.2* 30.2*  MCV 84.1  --  85.4 85.7 84.1 85.8  PLT 232  --  244 237 228 234    Cardiac Enzymes: No results for input(s): CKTOTAL, CKMB, CKMBINDEX, TROPONINI in the last 168 hours.  BNP: BNP (last 3 results) Recent Labs    11/17/20 1645  BNP 3,663.4*    ProBNP (last 3 results) No results for input(s): PROBNP in the last 8760 hours.    Other results:  Imaging: CT CHEST WO CONTRAST  Result Date: 11/26/2020 CLINICAL DATA:  Aortic disease. Preoperative left ventricular assist device implant EXAM: CT CHEST WITHOUT CONTRAST TECHNIQUE: Multidetector CT imaging of the chest was performed following the standard protocol without IV contrast. COMPARISON:  November 23, 2020 FINDINGS: Cardiovascular: There is no appreciable thoracic aortic aneurysm. No evident intramural hematoma. There are foci of aortic atherosclerosis. There are scattered foci of calcification in proximal visualized great vessels. There are multiple foci of coronary artery calcification. Pacemaker lead  attached to the right ventricle. There is stable left atrial and left ventricular prominence, unchanged from recent study. There is no pericardial effusion or pericardial thickening. Mediastinum/Nodes: Thyroid appears unremarkable. No demonstrable adenopathy. No appreciable esophageal lesions. Lungs/Pleura: Small pleural effusions bilaterally, essentially stable compared to recent study. There is atelectatic change in the right base. There is no edema or airspace opacity. Mild scarring noted in the left upper lobe anteriorly. No pneumothorax. Trachea and major bronchial structures appear unremarkable. Upper Abdomen: There is cholelithiasis. Gallbladder wall does not appear thickened. There is upper abdominal aortic atherosclerosis. Visualized upper abdominal structures otherwise appear normal. Musculoskeletal: Status post previous median sternotomy. Degenerative change noted throughout the thoracic spine. No blastic or lytic bone lesions. Pacemaker device present anteriorly on the left. IMPRESSION: 1. Status post median sternotomy with pacemaker lead attached to the right ventricle, unchanged. Stable left-sided cardiac prominence. No pericardial effusion. There are foci of aortic atherosclerosis as well as foci of great vessel and coronary artery calcification. No thoracic aortic aneurysm. 2. Stable small pleural effusions bilaterally. No edema or airspace opacity. Right base atelectasis noted. 3.  No evident adenopathy. 4.  Cholelithiasis. Aortic Atherosclerosis (ICD10-I70.0). Electronically Signed   By: Lowella Grip III M.D.   On: 11/26/2020 08:49     Medications:     Scheduled Medications: . amiodarone  400 mg Oral BID  . aspirin EC  81 mg Oral Daily  . atorvastatin  80 mg Oral QPM  . buPROPion  300 mg Oral Daily  . Chlorhexidine Gluconate Cloth  6 each Topical Daily  . escitalopram  20 mg Oral Daily  . feeding supplement  237 mL Oral BID BM  . mexiletine  150 mg Oral Q8H  . multivitamin with  minerals  1 tablet Oral Daily  . pantoprazole  40 mg Oral QAC breakfast  . sodium chloride flush  10-40 mL Intracatheter Q12H  . sodium chloride flush  3 mL Intravenous Q12H  . zolpidem  5 mg Oral QHS    Infusions: . heparin 1,250 Units/hr (11/26/20 2201)  . milrinone 0.25 mcg/kg/min (11/27/20 0522)    PRN Medications: acetaminophen **OR** acetaminophen, guaiFENesin-dextromethorphan, loperamide, ondansetron (ZOFRAN) IV, sodium chloride flush   Assessment/Plan:   1.Acute on chronic systolic heart failure due to iCM, EF 20-25% -> cardiogenic shock - Echo 11/21 EF 20-25% mild RV dysfunction moderate to severe MR - EF 20% with LV thrombus. Severe MR. Moderate RV dysfunction. Now on heparin. No bleeding - NYHA IV.  Low output physiology confirmed Co-ox (12/25) 44%. - RHC 11/24/20 on RA 6, PCWP 19, CO 6.2/3.0  - Milrinone started on 12/25. Cath was on  milrinone 0.25 mcg with stable hemodynamcis on cath.  - Todays CO-OX  62 %  on milrinone 0.25 mcg   - Volume status stable.  - No bb with shock - Off Entresto with worsening renal function.  - RV has moderate dysfunction but CVP low.  -CT surgery consulted. VAD coordinators following.  -  2.Monomorphic ventricular tachycardia -No further recurrence. ICD VT zones have been adjusted. Continue amiodarone.  - Keep K> 4.0 Mg > 2.0 -Recent left heart catheterization on 10/23/2020 without any targets for revascularization - No VT.  - Cut back amio to 200 mg daily and continue  + mexiletine 150 mg twice a day.    3.CAD status post CABG -Recent left heart cath with severe three-vessel CAD. -He has patent vein grafts. (LIMA -> LAD, SVG-> RCA, SVG -> OM2 -> OM3) -No chest pain.  -Continue aspirin and Lipitor  4.Severe mitral regurgitation - pending VAD  5. AKI  - due to cardiorenal/ATN - baseline 1.1 - 1.4 today.   6. Hypokalemia - K stable.   7. Apical LV mural thrombus - heparin started. Holding on coumadin due  to possible VAD  8. Hypoalbuminemia.  Prealbumin 13 .  Consult dietitian   9. Carotid Disease [id number] LICA stenosis on pre-surgical carotid dopplers.  VVS consulted.   Final intervention pending VAD work up.   10. Deconditioning PT following-- Concerned about his safety.  - Able to walk  down the hall with therapy.  Consult CIR.   11. Memory Issues  CT head with mild small vessel disease and cerebral atrophy.   Plan to discuss at [facility] Monday possible VAD.   Length of Stay: [place] NP-C  11/27/2020, 10:26 AM  Advanced Heart Failure Team Pager [phone] (M-F; 7a - 4p)  Please contact [facility] for night-coverage after hours (4p -7a ) and weekends on [email or web address]  Patient seen with NP, agree with the above note.   Stable today, co-ox 62% with CVP5on milrinone 0.25. He is not on a diuretic currently.    On heparin gtt with LV thrombus.  He is on both amiodarone and mexiletine with history of VT.  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 He is getting Ensure and seems to be eating his meals.  He was able to walk down to the end of the hall today with walker.    General: NAD Neck: No JVD, no thyromegaly or thyroid nodule.  Lungs: Clear to auscultation bilaterally with normal respiratory effort. CV: Nondisplaced PMI.  Heart regular S1/S2, no S3/S4, 1/6 HSM apex.  No peripheral edema.   Abdomen: Soft, nontender, no hepatosplenomegaly, no distention.  Skin: Intact without lesions or rashes.  Neurologic: Alert and oriented x 3.  Psych: Normal affect. Extremities: No clubbing or cyanosis.  HEENT: Normal.   Patient is inotrope dependent, continue milrinone 0.25. Does not need Lasix currently, continue to follow CVP.   Continue anticoagulation for LV thrombus.   Extensive CAD, no interventional option.  Of note, he has [id number] LICA stenosis on pre-surgical carotid dopplers. VVShas seen, if gets LVAD will need to fix  concomitantly.  Working up for LVAD. 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 He seems to be doing better in terms of mobility, walked down hall today with walker.  Potential LVAD + L CEA next week, will discuss at [facility].   Loralie Champagne 11/27/2020 10:54 AM

## 2020-11-27 NOTE — Progress Notes (Signed)
ANTICOAGULATION CONSULT NOTE  Pharmacy Consult for heparin Indication: LV thrombus 12/26   Allergies  Allergen Reactions  . Other Anaphylaxis    Insect bites (Has EpiPen)    Patient Measurements: Height: 6' (182.9 cm) Weight: 77.7 kg (171 lb 4.8 oz) IBW/kg (Calculated) : 77.6 Heparin Dosing Weight: 82kg  Vital Signs: Temp: 97.9 F (36.6 C) (12/31 0729) Temp Source: Oral (12/31 0729) BP: 125/71 (12/31 0729) Pulse Rate: 71 (12/31 0729)  Labs: Recent Labs    11/25/20 0525 11/25/20 0629 11/26/20 0520 11/27/20 0520  HGB 9.6*  --  9.3* 9.1*  HCT 31.8*  --  29.2* 30.2*  PLT 237  --  228 234  LABPROT  --   --  15.0  --   INR  --   --  1.2  --   HEPARINUNFRC  --  0.37 0.51 0.58  CREATININE 1.43*  --  1.48* 1.38*    Estimated Creatinine Clearance: 52.3 mL/min (A) (by C-G formula based on SCr of 1.38 mg/dL (H)).   Medical History: Past Medical History:  Diagnosis Date  . Adult ADHD   . AICD (automatic cardioverter/defibrillator) present   . CAD (coronary artery disease)    Cath 2008, all grafts patent // Cath 2017 - all grafts patent   . 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 Carotid artery disease (HCC)    Doppler, February, 2012, mild plaque, 0-39% bilateral, plan followup in one year  . Dyspepsia   . Ejection fraction < 50%   . Elevated PSA 02/15/2011  . Esophageal stricture   . GERD (gastroesophageal reflux disease)   . HTN (hypertension)   . Hx of CABG    1991  . Hyperlipidemia   . 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 Malignant neoplasm of tonsil (HCC)   . Myocardial infarction (HCC) 1998  . Prostate cancer (HCC) 2012   IMRT, ADT  . Thrombosis of arm    Left  . Venous thrombosis    Left arm      Assessment: 74 yo M with large laminated mural apical thrombus found on TTE 11/22/20,  originally admitted for HF exacerbation. Pharmacy consulted for IV heparin. No AC PTA.   Heparin level this morning came back therapeutic at 0.58, on 1250 units/hr. Hgb 9.1, plt 234. No s/sx of bleeding or infusion issues.   Goal of Therapy:  Heparin level 0.3-0.7 units/ml Monitor platelets by anticoagulation protocol: Yes   Plan:  Continue heparin infusion to 1250 units/hr Monitor daily HL, CBC/plt Monitor for signs/symptoms of bleeding  F/u long term anticoagulation plan  Jenetta Downer, [facility] Clinical Pharmacist  11/27/2020 10:37 AM   [facility] pharmacy phone numbers are listed on [email or web address]

## 2020-11-27 NOTE — Plan of Care (Signed)
  Problem: Education: Goal: Knowledge of General Education information will improve Description: Including pain rating scale, medication(s)/side effects and non-pharmacologic comfort measures Outcome: Progressing   Problem: Clinical Measurements: Goal: Respiratory complications will improve Outcome: Progressing Goal: Cardiovascular complication will be avoided Outcome: Progressing   Problem: Coping: Goal: Level of anxiety will decrease Outcome: Progressing   Problem: Elimination: Goal: Will not experience complications related to urinary retention Outcome: Progressing

## 2020-11-27 NOTE — Progress Notes (Signed)
Physical Therapy Treatment Patient Details Name: [name] MRN: [id number] DOB: 02-28-1946 Today's Date: 11/27/2020    History of Present Illness 74 y.o. 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 EF 20% with LV thrombus, severe MR, moderate RV dysfunction. Being worked up for LVAD.    PT Comments    Patient progressing slowly towards PT goals. Pt reports feeling well today. Tolerated gait training with Mod A for balance/safety. Pt with unsafe, fast gait speed with forward momentum and inability to slow down veering right and running into RW legs and things in hallway without awareness/ability to correct. Poor awareness of safety, deficits, ability to self monitor for activity/pacing self etc. Multiple LOB esp with turns due to narrow boS, poor balance and scissoring needing assist to prevent falls. High fall risk. VSS on RA with SP02 >92% and HR in 90s despite 2-3/4 DOE. Continue to recommend CIR. Will follow.    Follow Up Recommendations  CIR     Equipment Recommendations  3in1 (PT)    Recommendations for Other Services       Precautions / Restrictions Precautions Precautions: Fall Restrictions Weight Bearing Restrictions: No    Mobility  Bed Mobility Overal bed mobility: Needs Assistance Bed Mobility: Supine to Sit     Supine to sit: Supervision;HOB elevated     General bed mobility comments: Supervision for safety, use of rail.  Transfers Overall transfer level: Needs assistance Equipment used: Rolling walker (2 wheeled) Transfers: Sit to/from Stand Sit to Stand: Min assist         General transfer comment: Cues for hand placement/technique as pt attempting  to pull up on RW, stood from EOB x2, transferred to chair post ambulation.  Ambulation/Gait Ambulation/Gait assistance: Mod assist Gait Distance (Feet): 400 Feet Assistive device: Rolling walker (2 wheeled) Gait Pattern/deviations: Step-through pattern;Staggering right;Narrow base of support;Scissoring Gait velocity: too fast   General Gait Details: Fast, unsafe and unsteady gait speed with long strides; veers right running into RW legs and left knee hyperextension on a few occasions; forward momentum with inability to slow down despite cues. Difficulty with turns resulting in LOB on a few occasions with narrow boS and scissoring. poor ability to self monitor activity. 3/4 DOE. VSS on RA. Mod A for RW navigation, balance, and to decrease gait speed/prevent falls.   Stairs             Wheelchair Mobility    Modified Rankin (Stroke Patients Only)       Balance Overall balance assessment: Needs assistance Sitting-balance support: Feet supported;No upper extremity supported Sitting balance-Leahy Scale: Fair Sitting balance - Comments: Min guard=supervision for safety; able to adjust socks without difficulty.   Standing balance support: During functional activity Standing balance-Leahy Scale: Poor Standing balance comment: reliant on RW and external support                            Cognition Arousal/Alertness: Awake/alert Behavior During Therapy: Flat affect Overall Cognitive Status: Impaired/Different from baseline Area of Impairment: Memory;Safety/judgement;Awareness;Problem solving;Following commands                     Memory: Decreased short-term memory Following Commands:  Follows multi-step commands inconsistently;Follows one step commands with increased time Safety/Judgement: Decreased awareness of deficits;Decreased awareness of safety Awareness: Intellectual Problem Solving: Requires verbal cues General Comments: Poor awareness of  safety/deficits. Poor STM. Not able to self monitor activity well. High fall risk. Requires repetition to follow commands.      Exercises      General Comments General comments (skin integrity, edema, etc.): VSS on RA.      Pertinent Vitals/Pain Pain Assessment: No/denies pain    Home Living                      Prior Function            PT Goals (current goals can now be found in the care plan section) Progress towards PT goals: Progressing toward goals    Frequency    Min 3X/week      PT Plan Current plan remains appropriate    Co-evaluation              AM-PAC PT "6 Clicks" Mobility   Outcome Measure  Help needed turning from your back to your side while in a flat bed without using bedrails?: None Help needed moving from lying on your back to sitting on the side of a flat bed without using bedrails?: None Help needed moving to and from a bed to a chair (including a wheelchair)?: A Little Help needed standing up from a chair using your arms (e.g., wheelchair or bedside chair)?: A Little Help needed to walk in hospital room?: A Lot Help needed climbing 3-5 steps with a railing? : A Lot 6 Click Score: 18    End of Session Equipment Utilized During Treatment: Gait belt Activity Tolerance: Patient limited by fatigue Patient left: in chair;with call bell/phone within reach;with chair alarm set Nurse Communication: Mobility status PT Visit Diagnosis: Unsteadiness on feet (R26.81);Other abnormalities of gait and mobility (R26.89);Muscle weakness (generalized) (M62.81);Difficulty in walking, not elsewhere classified (R26.2);History of falling (Z91.81)     Time: 6010-9323 PT Time Calculation (min) (ACUTE ONLY): 19 min  Charges:  $Gait Training: 8-22 mins                     [name], PT, DPT Acute Rehabilitation Services Pager [phone] Office [phone]       [name] 11/27/2020, 11:41 AM

## 2020-11-28 DIAGNOSIS — I5043 Acute on chronic combined systolic (congestive) and diastolic (congestive) heart failure: Secondary | ICD-10-CM | POA: Diagnosis not present

## 2020-11-28 DIAGNOSIS — I472 Ventricular tachycardia: Secondary | ICD-10-CM | POA: Diagnosis not present

## 2020-11-28 LAB — COOXEMETRY PANEL
Carboxyhemoglobin: 1.5 % (ref 0.5–1.5)
Methemoglobin: 0.9 % (ref 0.0–1.5)
O2 Saturation: 67.2 %
Total hemoglobin: 9.2 g/dL — ABNORMAL LOW (ref 12.0–16.0)

## 2020-11-28 LAB — BASIC METABOLIC PANEL
Anion gap: 10 (ref 5–15)
BUN: 14 mg/dL (ref 8–23)
CO2: 21 mmol/L — ABNORMAL LOW (ref 22–32)
Calcium: 8.6 mg/dL — ABNORMAL LOW (ref 8.9–10.3)
Chloride: 109 mmol/L (ref 98–111)
Creatinine, Ser: 1.39 mg/dL — ABNORMAL HIGH (ref 0.61–1.24)
GFR, Estimated: 54 mL/min — ABNORMAL LOW (ref >60)
Glucose, Bld: 99 mg/dL (ref 70–99)
Potassium: 4 mmol/L (ref 3.5–5.1)
Sodium: 140 mmol/L (ref 135–145)

## 2020-11-28 LAB — CBC
HCT: 29.3 % — ABNORMAL LOW (ref 39.0–52.0)
Hemoglobin: 9.4 g/dL — ABNORMAL LOW (ref 13.0–17.0)
MCH: 26.8 pg (ref 26.0–34.0)
MCHC: 32.1 g/dL (ref 30.0–36.0)
MCV: 83.5 fL (ref 80.0–100.0)
Platelets: 228 10*3/uL (ref 150–400)
RBC: 3.51 MIL/uL — ABNORMAL LOW (ref 4.22–5.81)
RDW: 15.7 % — ABNORMAL HIGH (ref 11.5–15.5)
WBC: 6.3 10*3/uL (ref 4.0–10.5)
nRBC: 0 % (ref 0.0–0.2)

## 2020-11-28 LAB — HEPARIN LEVEL (UNFRACTIONATED): Heparin Unfractionated: 0.43 IU/mL (ref 0.30–0.70)

## 2020-11-28 NOTE — Progress Notes (Signed)
Patient ID: [name], male   DOB: [birth year], 75 y.o.   MRN: [id number]    Advanced Heart Failure Rounding Note   Subjective:    Milrinone started 12/25.    ECHO: 12/26 EF 20% with LV thrombus. Severe MR. Moderate RV dysfunction. Now on heparin. No bleeding  RHC 11/23/20 on milrinone 0.25 mcg.  RA = 6 RV = 56/10 PA = 51/22 (33) PCW = 19 Thermal cardiac output/index = 5.5/2.7 Fick CO/CI = 6.2/3.0 PVR = 2.3 WU Ao sat = 94% PA sat = 59%, 61% PAPi = 4.8   Remains on milrinone 0.25 mcg/kg/min. CO-OX  67%.  CVP 5-6.   Has been walking in hall, unsafe gait but seems stronger.    Objective:   Weight Range:  Vital Signs:   Temp:  [98.2 F (36.8 C)-98.7 F (37.1 C)] 98.4 F (36.9 C) (01/01 1148) Pulse Rate:  [69-73] 69 (01/01 1148) Resp:  [14-22] 22 (01/01 1148) BP: (121-142)/(67-75) 124/72 (01/01 1148) SpO2:  [94 %-99 %] 95 % (01/01 1148) Weight:  [78.4 kg] 78.4 kg (01/01 0415) Last BM Date: 11/26/20  Weight change: Filed Weights   11/26/20 0355 11/27/20 0357 11/28/20 0415  Weight: 78.7 kg 77.7 kg 78.4 kg    Intake/Output:   Intake/Output Summary (Last 24 hours) at 11/28/2020 1212 Last data filed at 11/28/2020 1101 Gross per 24 hour  Intake 897 ml  Output 825 ml  Net 72 ml    CVP 6 personally checked.  Physical Exam: General: NAD Neck: No JVD, no thyromegaly or thyroid nodule.  Lungs: Clear to auscultation bilaterally with normal respiratory effort. CV: Lateral PMI.  Heart regular S1/S2, no S3/S4, no murmur.  No peripheral edema.   Abdomen: Soft, nontender, no hepatosplenomegaly, no distention.  Skin: Intact without lesions or rashes.  Neurologic: Alert and oriented x 3.  Psych: Normal affect. Extremities: No clubbing or cyanosis.  HEENT: Normal.   Telemetry:  NSR 60-70s personally checked.  Labs: Basic Metabolic Panel: Recent Labs  Lab 11/22/20 0539 11/23/20 0612 11/23/20 0754 11/24/20 0450 11/25/20 0525 11/26/20 0520 11/27/20 0520  11/28/20 0201  NA 138 139   < > 138 136 138 140 140  K 3.2* 4.0   < > 4.2 4.4 3.9 4.1 4.0  CL 102 106  --  107 105 106 107 109  CO2 25 23  --  23 22 23 24  21*  GLUCOSE 85 93  --  91 164* 113* 108* 99  BUN 23 17  --  11 10 12 11 14   CREATININE 1.81* 1.53*  --  1.50* 1.43* 1.48* 1.38* 1.39*  CALCIUM 8.3* 8.2*  --  8.4* 9.1 8.4* 8.7* 8.6*  MG 2.0 1.8  --  2.2 2.0  --   --   --    < > = values in this interval not displayed.    Liver Function Tests: Recent Labs  Lab 11/24/20 0450  AST 40  ALT 127*  ALKPHOS 110  BILITOT 1.5*  PROT 5.5*  ALBUMIN 2.9*   No results for input(s): LIPASE, AMYLASE in the last 168 hours. No results for input(s): AMMONIA in the last 168 hours.  CBC: Recent Labs  Lab 11/24/20 0450 11/25/20 0525 11/26/20 0520 11/27/20 0520 11/28/20 0201  WBC 5.3 5.9 5.2 6.6 6.3  NEUTROABS 3.3  --   --   --   --   HGB 9.8* 9.6* 9.3* 9.1* 9.4*  HCT 31.0* 31.8* 29.2* 30.2* 29.3*  MCV 85.4 85.7 84.1 85.8  83.5  PLT 244 237 228 234 228    Cardiac Enzymes: No results for input(s): CKTOTAL, CKMB, CKMBINDEX, TROPONINI in the last 168 hours.  BNP: BNP (last 3 results) Recent Labs    11/17/20 1645  BNP 3,663.4*    ProBNP (last 3 results) No results for input(s): PROBNP in the last 8760 hours.    Other results:  Imaging: No results found.   Medications:     Scheduled Medications: . amiodarone  200 mg Oral BID  . aspirin EC  81 mg Oral Daily  . atorvastatin  80 mg Oral QPM  . buPROPion  300 mg Oral Daily  . Chlorhexidine Gluconate Cloth  6 each Topical Daily  . escitalopram  20 mg Oral Daily  . feeding supplement  237 mL Oral BID BM  . mexiletine  150 mg Oral Q8H  . multivitamin with minerals  1 tablet Oral Daily  . pantoprazole  40 mg Oral QAC breakfast  . sodium chloride flush  10-40 mL Intracatheter Q12H  . sodium chloride flush  3 mL Intravenous Q12H  . zolpidem  5 mg Oral QHS    Infusions: . heparin 1,250 Units/hr (11/27/20 1832)  .  milrinone 0.25 mcg/kg/min (11/28/20 0903)    PRN Medications: acetaminophen **OR** acetaminophen, guaiFENesin-dextromethorphan, loperamide, ondansetron (ZOFRAN) IV, sodium chloride flush   Assessment/Plan:   1.Acute on chronic systolic heart failure due to iCM, EF 20-25% -> cardiogenic shock - Echo 11/21 EF 20-25% mild RV dysfunction moderate to severe MR - EF 20% with LV thrombus. Severe MR. Moderate RV dysfunction. Now on heparin. No bleeding - NYHA IV.  Low output physiology confirmed Co-ox (12/25) 44%. - RHC 11/24/20 on RA 6, PCWP 19, CO 6.2/3.0  - Milrinone started on 12/25. Cath was on milrinone 0.25 mcg with stable hemodynamcis on cath.  - Todays CO-OX  67%  on milrinone 0.25 mcg   - Volume status stable, CVP 6.  - No bb with shock - Off Entresto with worsening renal function.  - RV has moderate dysfunction but CVP low. - Working up for LVAD. 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 He seems to be doing better in terms of mobility.  Potential LVAD + L CEA next week, will discuss at [facility] Monday.   2.Monomorphic ventricular tachycardia -No further recurrence. ICD VT zones have been adjusted. Continue amiodarone.  - Keep K> 4.0 Mg > 2.0 - Recent left heart catheterization on 10/23/2020 without any targets for revascularization - No VT.  - Continue amiodarone 200 mg daily and continue  + mexiletine 150 mg twice a day.    3.CAD status post CABG -Recent left heart cath with severe three-vessel CAD. -He has patent vein grafts. (LIMA -> LAD, SVG-> RCA, SVG -> OM2 -> OM3) -No chest pain.  -Continue aspirin and Lipitor  4.Severe mitral regurgitation - pending VAD  5. AKI  - due to cardiorenal/ATN - baseline 1.1 - 1.4 today.   6. Hypokalemia - K stable.   7. Apical LV mural thrombus - heparin started. Holding on coumadin due to possible VAD  8. Hypoalbuminemia.  Prealbumin 13 .  Consult dietitian   9. Carotid Disease - He  had >80% LICA stenosis on pre-surgical carotid dopplers. VVShas seen, if gets LVAD will need to fix concomitantly.  10. Deconditioning PT following-- Concerned about his safety.  - Able to walk  down the hall with therapy.  Consult CIR.   11. Memory Issues  CT head with mild small  vessel disease and cerebral atrophy.   Plan to discuss at [facility] Monday possible VAD.   Length of Stay: [place]  11/28/2020, 12:12 PM  Advanced Heart Failure Team Pager [phone] (M-F; 7a - 4p)  Please contact [facility] for night-coverage after hours (4p -7a ) and weekends on [email or web address]

## 2020-11-28 NOTE — Progress Notes (Signed)
ANTICOAGULATION CONSULT NOTE  Pharmacy Consult for heparin Indication: LV thrombus 12/26   Allergies  Allergen Reactions  . Other Anaphylaxis    Insect bites (Has EpiPen)    Patient Measurements: Height: 6' (182.9 cm) Weight: 78.4 kg (172 lb 12.8 oz) IBW/kg (Calculated) : 77.6 Heparin Dosing Weight: 82kg  Vital Signs: Temp: 98.4 F (36.9 C) (01/01 0415) Temp Source: Oral (01/01 0415) BP: 125/70 (01/01 0415) Pulse Rate: 72 (12/31 1931)  Labs: Recent Labs    11/26/20 0520 11/27/20 0520 11/28/20 0201  HGB 9.3* 9.1* 9.4*  HCT 29.2* 30.2* 29.3*  PLT 228 234 228  LABPROT 15.0  --   --   INR 1.2  --   --   HEPARINUNFRC 0.51 0.58 0.43  CREATININE 1.48* 1.38* 1.39*    Estimated Creatinine Clearance: 52 mL/min (A) (by C-G formula based on SCr of 1.39 mg/dL (H)).   Medical History: Past Medical History:  Diagnosis Date  . Adult ADHD   . AICD (automatic cardioverter/defibrillator) present   . CAD (coronary artery disease)    Cath 2008, all grafts patent // Cath 2017 - all grafts patent   . 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 Carotid artery disease (HCC)    Doppler, February, 2012, mild plaque, 0-39% bilateral, plan followup in one year  . Dyspepsia   . Ejection fraction < 50%   . Elevated PSA 02/15/2011  . Esophageal stricture   . GERD (gastroesophageal reflux disease)   . HTN (hypertension)   . Hx of CABG    1991  . Hyperlipidemia   . 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 Malignant neoplasm of tonsil (HCC)   . Myocardial infarction (HCC) 1998  . Prostate cancer (HCC) 2012   IMRT, ADT  . Thrombosis of arm    Left  . Venous thrombosis    Left arm      Assessment: 75 yo M with large laminated mural apical thrombus found on TTE 11/22/20, originally admitted for HF exacerbation. Pharmacy  consulted for IV heparin. No AC PTA.   Heparin level continues to be therapeutic at 0.43 on 1250 units/hr. Hgb 9.4, plt 228. No s/sx of bleeding or infusion issues.   Goal of Therapy:  Heparin level 0.3-0.7 units/ml Monitor platelets by anticoagulation protocol: Yes   Plan:  Continue heparin infusion to 1250 units/hr Monitor daily HL, CBC/plt Monitor for signs/symptoms of bleeding  F/u long term anticoagulation plan  Kinnie Feil, PharmD PGY1 Acute Care Pharmacy Resident Phone: [phone] 11/28/2020 7:20 AM  Please check [email or web address] for unit specific pharmacy phone numbers.

## 2020-11-28 NOTE — Progress Notes (Signed)
PROGRESS NOTE  [name] [id number] DOB: 01/11/1946 DOA: 11/17/2020 PCP: Hoyt Koch, MD  Brief History   [name] a 75 y.o.malewith medical history significant ofCAD status post CABG and PCI, chronic combined systolic and diastolic CHF/ischemic cardiomyopathy with LVEF 20-25% and history of VT status post ICD, hypertension, hyperlipidemia,CKD stage IIIa,history of prostate and tonsillar cancer, history of DVT presenting to the ED with complaints of shortness of breath.Patient reports dyspnea on exertion and cough for the past 3 to 4 days. He was having low-grade fevers earlier but has not had them for the past few days. He has also had 3 episodes of vomiting over the past week which has now resolved. Denies abdominal pain or diarrhea. States his ICD had shocked him prior to his recent hospitalization but he has not had any issues lately. He is vaccinated against Covid.   ED Course:Afebrile.Tachypneic with respiratory rate in the 20s.Not tachycardic.Not hypoxic. WBC 7.0, hemoglobin 10.3, hematocrit 33.9, platelet 186K.Sodium 140, potassium 3.7, chloride 107, bicarb 22, BUN 27, creatinine 1.7, glucose 106. High-sensitivity troponin mildly elevated but stable (29 >29).BNP significantly elevated at 3663. SARS-CoV-2 PCR test and influenza panel both negative.  Chest x-ray showing cardiomegaly and small effusions in the posterior costophrenic angles; no frank edema.Patient was given IV Lasix 20 mg.  Vascular surgery was consulted due to the patient's carotid artery stenosis, Left ICA stenosis >80%. The patient was evaluated by Dr. Donzetta Matters on 11/24/2020. He determined that if the patient were to meet criteria for LVAD placement, it would make sense to perform CEA during the same procedure. However, if the patient were not found to be a candidate for the LVAD procedure, then he would not be seen as a candidate for CEA.  On 11/27/2020 the patient  was able to ambulate down the hall without difficulty on room air maintaining SaO2 of 95%.   Consultants  . Cardiology . Vascular Surgery . LVAD Team. . Heart Failure Team  Procedures  . None  Antibiotics   Anti-infectives (From admission, onward)   None     Subjective  The patient is resting comfortably. No new complaints.  Objective   Vitals:  Vitals:   11/28/20 0838 11/28/20 1148  BP: 123/75 124/72  Pulse: 73 69  Resp: (!) 21 (!) 22  Temp: 98.5 F (36.9 C) 98.4 F (36.9 C)  SpO2: 94% 95%    Exam:  Constitutional:  . The patient is awake, alert, and oriented x 3. No acute distress. Respiratory:  . No increased work of breathing. . No wheezes, rales, or rhonchi . No tactile fremitus Cardiovascular:  . Regular rate and rhythm . No murmurs, ectopy, or gallups. . No lateral PMI. No thrills. Abdomen:  . Abdomen is soft, non-tender, non-distended . No hernias, masses, or organomegaly . Normoactive bowel sounds.  Musculoskeletal:  . No cyanosis, clubbing, or edema Skin:  . No rashes, lesions, ulcers . palpation of skin: no induration or nodules Neurologic:  . CN 2-12 intact . Sensation all 4 extremities intact Psychiatric:  . Mental status o Mood, affect appropriate o Orientation to person, place, time  . judgment and insight appear intact  I have personally reviewed the following:   Today's Data  . Vitals, BMP, CBC, methemeglobin,   Imaging  . CT head without contrast . Vascular ultrasound . CT Chest without contrast  Cardiology Data  . EKG . Echocardiogram (11/22/2020)  Scheduled Meds: . amiodarone  200 mg Oral BID  . aspirin EC  81  mg Oral Daily  . atorvastatin  80 mg Oral QPM  . buPROPion  300 mg Oral Daily  . Chlorhexidine Gluconate Cloth  6 each Topical Daily  . escitalopram  20 mg Oral Daily  . feeding supplement  237 mL Oral BID BM  . mexiletine  150 mg Oral Q8H  . multivitamin with minerals  1 tablet Oral Daily  .  pantoprazole  40 mg Oral QAC breakfast  . sodium chloride flush  10-40 mL Intracatheter Q12H  . sodium chloride flush  3 mL Intravenous Q12H  . zolpidem  5 mg Oral QHS   Continuous Infusions: . heparin 1,250 Units/hr (11/27/20 1832)  . milrinone 0.25 mcg/kg/min (11/28/20 0903)    Principal Problem:   VT (ventricular tachycardia) (HCC) Active Problems:   Hyperlipidemia with target LDL less than 70   Carotid artery disease (HCC)   Acute on chronic combined systolic and diastolic CHF (congestive heart failure) (HCC)   CHF exacerbation (HCC)   Acute on chronic kidney failure (HCC)   LOS: 11 days   A & P  Acute on chronic combined systolic and diastolic CHF: Patient presented with signs of fluid overload.  Chest x-ray shows cardiomegaly and small effusion in the posterior costophrenic angles. On admission the patient was found to have bibasilar rales, JVD and significantly elevated BNP of 3663. This was despite the use of milrinone drip at home. Echo from 10/24/2020 showing severely reduced ejection fraction of 20 to 123456, grade 2 diastolic dysfunction, moderate to severe MR. RHC on 11/23/2020 demonstrated mild pulmonary venous hypertension, improved volume status, and acceptable cardiac output on milrinone. The pulmonary artery pulsatility index was also found to be acceptable at 4.8. (Pulmonary artery pressure/right atrial pressure). Cardiology discontinued Lasix, Entresto, and Coreg. The patient has been continued on milrinone. Volume will be carefully monitored along with electrolytes and creatinine. Magnesium will be kept greater than 2 and potassium greater than 4. The patient is being evaluated by the VAD team, CTS and vascular surgery. Consideration is being given to CIR, but it is not clear whether this will be pursued before or after VAD. The patient will be discussed in conference on Monday.  Monomorphic ventricular tachycardia: The patient was given loading dose of amiodarone and then  was placed on a drip. He has been continued on amiodarone 400 mg twice daily and mexiletine, although today that has been reduced to 400 mg amiodarone daily and mexiletine 150 mg bid. The patient underwent recent left heart cath on 10/23/2020 without any targets for revascularization. The assistance of cardiology and EP is appreciated.  LV thrombus: Noted on echo on 12/26. Shows 20% ejection fraction with LV thrombus.  Severe MR.  Moderate RV dysfunction. Continue IV heparin as per pharmacy. He has not been converted to coumadin due to possible VAD.  Carotid Stenosis: Doppler demonstrated >80% stenosis of the left ICA. Vascular surgery was consulted. The patient was evaluated by Dr. Donzetta Matters on 11/24/2020. He determined that if the patient were to meet criteria for LVAD placement, it would make sense to perform CEA during the same procedure. However, if the patient were not found to be a candidate for the LVAD procedure, then he would not be seen as a candidate for CEA.  AKI on CKD stage IIIa: Kidney function improving. Creatinine today is down to 1.44. Baseline creatinine is 1.1. Entresto and Lasix was discontinued by cardiology. Continue to monitor creatinine, electrolytes, and volume status. Avoid nephrotoxic substances and hypotension.  Severe MR: Status post  RHC on 12/27. Appreciate cardiology assistance.  History of coronary artery disease status post CABG and PCI:  Patient denies ACS symptoms.  Troponin mildly elevated but stable 29x2.  Suspected demand ischemia from decompensated CHF. Continue aspirin, and statin  Hypokalemia: Resolved. Monitor.  Hypomagnesemia: Resolved  Hypertension: Stable.  Continue Coreg and Entresto. Monitor blood pressure closely.  Hyperlipidemia: Continue Lipitor  GERD: Continue PPI  Depression/anxiety: Continue Lexapro, Wellbutrin and Ambien  Hypoalbuminemia: Prealbumin: 13. Dietitian consulted.  I have seen and examined this patient myself. I have  spent 34 minutes in his evaluation and care.  DVT prophylaxis: Heparin Code Status: None Family Communication: None available. Disposition Plan: T.B.D. Depends upon plans for LVAD and CEA.  Status is: Inpatient  Dispo: The patient is from: Home  Anticipated d/c is to: Home  Anticipated d/c date is: TBD  Patient currently is not medically stable to d/c.  Daphne Karrer, DO [facility] Direct contact: see [email or web address]  7PM-7AM contact night coverage as above 11/28/2020, 1:50 PM  LOS: 8 days

## 2020-11-29 DIAGNOSIS — I5043 Acute on chronic combined systolic (congestive) and diastolic (congestive) heart failure: Secondary | ICD-10-CM | POA: Diagnosis not present

## 2020-11-29 DIAGNOSIS — I472 Ventricular tachycardia: Secondary | ICD-10-CM | POA: Diagnosis not present

## 2020-11-29 DIAGNOSIS — I509 Heart failure, unspecified: Secondary | ICD-10-CM

## 2020-11-29 LAB — CBC
HCT: 30.6 % — ABNORMAL LOW (ref 39.0–52.0)
Hemoglobin: 9.7 g/dL — ABNORMAL LOW (ref 13.0–17.0)
MCH: 26.8 pg (ref 26.0–34.0)
MCHC: 31.7 g/dL (ref 30.0–36.0)
MCV: 84.5 fL (ref 80.0–100.0)
Platelets: 253 10*3/uL (ref 150–400)
RBC: 3.62 MIL/uL — ABNORMAL LOW (ref 4.22–5.81)
RDW: 15.7 % — ABNORMAL HIGH (ref 11.5–15.5)
WBC: 6.2 10*3/uL (ref 4.0–10.5)
nRBC: 0 % (ref 0.0–0.2)

## 2020-11-29 LAB — BASIC METABOLIC PANEL
Anion gap: 9 (ref 5–15)
BUN: 14 mg/dL (ref 8–23)
CO2: 25 mmol/L (ref 22–32)
Calcium: 8.8 mg/dL — ABNORMAL LOW (ref 8.9–10.3)
Chloride: 106 mmol/L (ref 98–111)
Creatinine, Ser: 1.43 mg/dL — ABNORMAL HIGH (ref 0.61–1.24)
GFR, Estimated: 52 mL/min — ABNORMAL LOW (ref >60)
Glucose, Bld: 102 mg/dL — ABNORMAL HIGH (ref 70–99)
Potassium: 4.3 mmol/L (ref 3.5–5.1)
Sodium: 140 mmol/L (ref 135–145)

## 2020-11-29 LAB — COOXEMETRY PANEL
Carboxyhemoglobin: 1.2 % (ref 0.5–1.5)
Carboxyhemoglobin: 1.3 % (ref 0.5–1.5)
Carboxyhemoglobin: 1.6 % — ABNORMAL HIGH (ref 0.5–1.5)
Methemoglobin: 0.7 % (ref 0.0–1.5)
Methemoglobin: 0.7 % (ref 0.0–1.5)
Methemoglobin: 0.9 % (ref 0.0–1.5)
O2 Saturation: 38.6 %
O2 Saturation: 49.6 %
O2 Saturation: 63.2 %
Total hemoglobin: 9.1 g/dL — ABNORMAL LOW (ref 12.0–16.0)
Total hemoglobin: 9.1 g/dL — ABNORMAL LOW (ref 12.0–16.0)
Total hemoglobin: 9.4 g/dL — ABNORMAL LOW (ref 12.0–16.0)

## 2020-11-29 LAB — HEPARIN LEVEL (UNFRACTIONATED): Heparin Unfractionated: 0.41 IU/mL (ref 0.30–0.70)

## 2020-11-29 NOTE — Progress Notes (Signed)
ANTICOAGULATION CONSULT NOTE  Pharmacy Consult for heparin Indication: LV thrombus 12/26   Allergies  Allergen Reactions  . Other Anaphylaxis    Insect bites (Has EpiPen)    Patient Measurements: Height: 6' (182.9 cm) Weight: 78.1 kg (172 lb 3.2 oz) IBW/kg (Calculated) : 77.6 Heparin Dosing Weight: 82kg  Vital Signs: Temp: 98.2 F (36.8 C) (01/02 0725) Temp Source: Oral (01/02 0725) BP: 127/76 (01/02 0725) Pulse Rate: 72 (01/02 0725)  Labs: Recent Labs    11/27/20 0520 11/28/20 0201 11/29/20 0317  HGB 9.1* 9.4* 9.7*  HCT 30.2* 29.3* 30.6*  PLT 234 228 253  HEPARINUNFRC 0.58 0.43 0.41  CREATININE 1.38* 1.39* 1.43*    Estimated Creatinine Clearance: 50.5 mL/min (A) (by C-G formula based on SCr of 1.43 mg/dL (H)).   Medical History: Past Medical History:  Diagnosis Date  . Adult ADHD   . AICD (automatic cardioverter/defibrillator) present   . CAD (coronary artery disease)    Cath 2008, all grafts patent // Cath 2017 - all grafts patent   . 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 Carotid artery disease (HCC)    Doppler, February, 2012, mild plaque, 0-39% bilateral, plan followup in one year  . Dyspepsia   . Ejection fraction < 50%   . Elevated PSA 02/15/2011  . Esophageal stricture   . GERD (gastroesophageal reflux disease)   . HTN (hypertension)   . Hx of CABG    1991  . Hyperlipidemia   . 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 Malignant neoplasm of tonsil (HCC)   . Myocardial infarction (HCC) 1998  . Prostate cancer (HCC) 2012   IMRT, ADT  . Thrombosis of arm    Left  . Venous thrombosis    Left arm      Assessment: 75 yo M with large laminated mural apical thrombus found on TTE 11/22/20, originally admitted for HF exacerbation. Pharmacy consulted for IV heparin. No AC PTA.   Heparin  level continues to be therapeutic at 0.41 on 1250 units/hr. Hgb stable 9's, PLTs WNL. No s/sx of bleeding or infusion issues.   Goal of Therapy:  Heparin level 0.3-0.7 units/ml Monitor platelets by anticoagulation protocol: Yes   Plan:  Continue heparin infusion 1250 units/hr Monitor daily HL, CBC/plt Monitor for signs/symptoms of bleeding  F/u long term anticoagulation plan  Kinnie Feil, PharmD PGY1 Acute Care Pharmacy Resident Phone: [phone] 11/29/2020 7:44 AM  Please check [email or web address] for unit specific pharmacy phone numbers.

## 2020-11-29 NOTE — Care Plan (Signed)
Reviewed chart given patient's primary issues stem from advanced heart failure  Internal medicine/hospitalist will sign off as primary and heart failure team will be primary attending as discussed with Dr. Jearld Pies.  Please let us know if there are any medicine issues to help with and we will gladly consult.   Laverna Peace [facility]

## 2020-11-29 NOTE — Progress Notes (Signed)
Ambulated in hallway. Tolerated well.

## 2020-11-29 NOTE — Progress Notes (Signed)
Pt assisted to chair, unsteady needs stand by assist.

## 2020-11-29 NOTE — Progress Notes (Signed)
Patient ID: [name], male   DOB: [birth year], 75 y.o.   MRN: [id number]    Advanced Heart Failure Rounding Note   Subjective:    Milrinone started 12/25.   ECHO: 12/26 EF 20% with LV thrombus. Severe MR. Moderate RV dysfunction. Now on heparin. No bleeding  RHC 11/23/20 on milrinone 0.25 mcg.  RA = 6 RV = 56/10 PA = 51/22 (33) PCW = 19 Thermal cardiac output/index = 5.5/2.7 Fick CO/CI = 6.2/3.0 PVR = 2.3 WU Ao sat = 94% PA sat = 59%, 61% PAPi = 4.8   Remains on milrinone 0.25 mcg/kg/min. Repeat co-ox today 50%.  CVP 5.    Has been walking in hall, unsafe gait but seems stronger.    Objective:   Weight Range:  Vital Signs:   Temp:  [98 F (36.7 C)-98.7 F (37.1 C)] 98 F (36.7 C) (01/02 1104) Pulse Rate:  [68-74] 68 (01/02 1104) Resp:  [17-22] 18 (01/02 1104) BP: (110-132)/(63-76) 110/63 (01/02 1104) SpO2:  [94 %-96 %] 95 % (01/02 1104) Weight:  [78.1 kg] 78.1 kg (01/02 0322) Last BM Date: 11/28/20  Weight change: Filed Weights   11/27/20 0357 11/28/20 0415 11/29/20 0322  Weight: 77.7 kg 78.4 kg 78.1 kg    Intake/Output:   Intake/Output Summary (Last 24 hours) at 11/29/2020 1105 Last data filed at 11/29/2020 0927 Gross per 24 hour  Intake 1960.26 ml  Output 1125 ml  Net 835.26 ml    CVP 5 personally checked.  Physical Exam: General: NAD Neck: No JVD, no thyromegaly or thyroid nodule.  Lungs: Clear to auscultation bilaterally with normal respiratory effort. CV: Nondisplaced PMI.  Heart regular S1/S2, no S3/S4, no murmur.  No peripheral edema.   Abdomen: Soft, nontender, no hepatosplenomegaly, no distention.  Skin: Intact without lesions or rashes.  Neurologic: Alert and oriented x 3.  Psych: Normal affect. Extremities: No clubbing or cyanosis.  HEENT: Normal.   Telemetry:  NSR 60-70s personally checked.  Labs: Basic Metabolic Panel: Recent Labs  Lab 11/23/20 0612 11/23/20 0754 11/24/20 0450 11/25/20 0525 11/26/20 0520 11/27/20 0520  11/28/20 0201 11/29/20 0317  NA 139   < > 138 136 138 140 140 140  K 4.0   < > 4.2 4.4 3.9 4.1 4.0 4.3  CL 106  --  107 105 106 107 109 106  CO2 23  --  23 22 23 24  21* 25  GLUCOSE 93  --  91 164* 113* 108* 99 102*  BUN 17  --  11 10 12 11 14 14   CREATININE 1.53*  --  1.50* 1.43* 1.48* 1.38* 1.39* 1.43*  CALCIUM 8.2*  --  8.4* 9.1 8.4* 8.7* 8.6* 8.8*  MG 1.8  --  2.2 2.0  --   --   --   --    < > = values in this interval not displayed.    Liver Function Tests: Recent Labs  Lab 11/24/20 0450  AST 40  ALT 127*  ALKPHOS 110  BILITOT 1.5*  PROT 5.5*  ALBUMIN 2.9*   No results for input(s): LIPASE, AMYLASE in the last 168 hours. No results for input(s): AMMONIA in the last 168 hours.  CBC: Recent Labs  Lab 11/24/20 0450 11/25/20 0525 11/26/20 0520 11/27/20 0520 11/28/20 0201 11/29/20 0317  WBC 5.3 5.9 5.2 6.6 6.3 6.2  NEUTROABS 3.3  --   --   --   --   --   HGB 9.8* 9.6* 9.3* 9.1* 9.4* 9.7*  HCT 31.0*  31.8* 29.2* 30.2* 29.3* 30.6*  MCV 85.4 85.7 84.1 85.8 83.5 84.5  PLT 244 237 228 234 228 253    Cardiac Enzymes: No results for input(s): CKTOTAL, CKMB, CKMBINDEX, TROPONINI in the last 168 hours.  BNP: BNP (last 3 results) Recent Labs    11/17/20 1645  BNP 3,663.4*    ProBNP (last 3 results) No results for input(s): PROBNP in the last 8760 hours.    Other results:  Imaging: No results found.   Medications:     Scheduled Medications: . amiodarone  200 mg Oral BID  . aspirin EC  81 mg Oral Daily  . atorvastatin  80 mg Oral QPM  . buPROPion  300 mg Oral Daily  . Chlorhexidine Gluconate Cloth  6 each Topical Daily  . escitalopram  20 mg Oral Daily  . feeding supplement  237 mL Oral BID BM  . mexiletine  150 mg Oral Q8H  . multivitamin with minerals  1 tablet Oral Daily  . pantoprazole  40 mg Oral QAC breakfast  . sodium chloride flush  10-40 mL Intracatheter Q12H  . sodium chloride flush  3 mL Intravenous Q12H  . zolpidem  5 mg Oral QHS     Infusions: . heparin 1,250 Units/hr (11/29/20 0542)  . milrinone 0.25 mcg/kg/min (11/29/20 0307)    PRN Medications: acetaminophen **OR** acetaminophen, guaiFENesin-dextromethorphan, loperamide, ondansetron (ZOFRAN) IV, sodium chloride flush   Assessment/Plan:   1.Acute on chronic systolic heart failure due to iCM, EF 20-25% -> cardiogenic shock - Echo 11/21 EF 20-25% mild RV dysfunction moderate to severe MR - EF 20% with LV thrombus. Severe MR. Moderate RV dysfunction. Now on heparin. No bleeding - NYHA IV.  Low output physiology confirmed Co-ox (12/25) 44%. - RHC 11/24/20 on RA 6, PCWP 19, CO 6.2/3.0  - Milrinone started on 12/25. Cath was on milrinone 0.25 mcg with stable hemodynamcis on cath.  - Co-ox lower today, 50% on repeat.  Will increase milrinone to 0.375.  - Volume status stable, CVP 5.  - No bb with shock - Off Entresto with worsening renal function.  - RV has moderate dysfunction but CVP low. - Working up for LVAD. 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 He seems to be doing better in terms of mobility.  Potential LVAD + L CEA next week, will discuss at [facility] Monday.   2.Monomorphic ventricular tachycardia -No further recurrence. ICD VT zones have been adjusted. Continue amiodarone.  - Keep K> 4.0 Mg > 2.0 - Recent left heart catheterization on 10/23/2020 without any targets for revascularization - No VT.  - Continue amiodarone 200 mg daily and continue  + mexiletine 150 mg twice a day.    3.CAD status post CABG -Recent left heart cath with severe three-vessel CAD. -He has patent vein grafts. (LIMA -> LAD, SVG-> RCA, SVG -> OM2 -> OM3) -No chest pain.  -Continue aspirin and Lipitor  4.Severe mitral regurgitation - pending VAD  5. AKI  - due to cardiorenal/ATN - baseline 1.1 - 1.4 today.   6. Hypokalemia - K stable.   7. Apical LV mural thrombus - heparin started. Holding on coumadin due to possible  VAD  8. Hypoalbuminemia.  Prealbumin 13 .  Consult dietitian   9. Carotid Disease - He had [id number] LICA stenosis on pre-surgical carotid dopplers. VVShas seen, if gets LVAD will need to fix concomitantly.  10. Deconditioning PT following-- Concerned about his safety.  - Able to walk  down the hall with  therapy.   11. Memory Issues  CT head with mild small vessel disease and cerebral atrophy.   Plan to discuss at [facility] Monday possible VAD.   Length of Stay: 12   Marca Ancona  11/29/2020, 11:05 AM  Advanced Heart Failure Team Pager [phone] (M-F; 7a - 4p)  Please contact [facility] for night-coverage after hours (4p -7a ) and weekends on [email or web address]

## 2020-11-30 DIAGNOSIS — I472 Ventricular tachycardia: Secondary | ICD-10-CM | POA: Diagnosis not present

## 2020-11-30 LAB — CBC
HCT: 30.2 % — ABNORMAL LOW (ref 39.0–52.0)
Hemoglobin: 9.1 g/dL — ABNORMAL LOW (ref 13.0–17.0)
MCH: 25.8 pg — ABNORMAL LOW (ref 26.0–34.0)
MCHC: 30.1 g/dL (ref 30.0–36.0)
MCV: 85.6 fL (ref 80.0–100.0)
Platelets: 235 10*3/uL (ref 150–400)
RBC: 3.53 MIL/uL — ABNORMAL LOW (ref 4.22–5.81)
RDW: 15.6 % — ABNORMAL HIGH (ref 11.5–15.5)
WBC: 6 10*3/uL (ref 4.0–10.5)
nRBC: 0 % (ref 0.0–0.2)

## 2020-11-30 LAB — BASIC METABOLIC PANEL
Anion gap: 12 (ref 5–15)
BUN: 16 mg/dL (ref 8–23)
CO2: 23 mmol/L (ref 22–32)
Calcium: 8.7 mg/dL — ABNORMAL LOW (ref 8.9–10.3)
Chloride: 104 mmol/L (ref 98–111)
Creatinine, Ser: 1.37 mg/dL — ABNORMAL HIGH (ref 0.61–1.24)
GFR, Estimated: 54 mL/min — ABNORMAL LOW (ref >60)
Glucose, Bld: 97 mg/dL (ref 70–99)
Potassium: 3.6 mmol/L (ref 3.5–5.1)
Sodium: 139 mmol/L (ref 135–145)

## 2020-11-30 LAB — COOXEMETRY PANEL
Carboxyhemoglobin: 1.7 % — ABNORMAL HIGH (ref 0.5–1.5)
Methemoglobin: 0.8 % (ref 0.0–1.5)
O2 Saturation: 73 %
Total hemoglobin: 9.3 g/dL — ABNORMAL LOW (ref 12.0–16.0)

## 2020-11-30 LAB — HEPARIN LEVEL (UNFRACTIONATED): Heparin Unfractionated: 0.37 IU/mL (ref 0.30–0.70)

## 2020-11-30 MED ORDER — POTASSIUM CHLORIDE CRYS ER 20 MEQ PO TBCR
40.0000 meq | EXTENDED_RELEASE_TABLET | Freq: Once | ORAL | Status: AC
  Administered 2020-11-30: 40 meq via ORAL
  Filled 2020-11-30: qty 2

## 2020-11-30 NOTE — Progress Notes (Signed)
ANTICOAGULATION CONSULT NOTE  Pharmacy Consult for heparin Indication: LV thrombus 12/26   Allergies  Allergen Reactions  . Other Anaphylaxis    Insect bites (Has EpiPen)    Patient Measurements: Height: 6' (182.9 cm) Weight: 79.3 kg (174 lb 13.2 oz) IBW/kg (Calculated) : 77.6 Heparin Dosing Weight: 82kg  Vital Signs: Temp: 97.6 F (36.4 C) (01/03 1125) Temp Source: Oral (01/03 1125) BP: 108/62 (01/03 1125) Pulse Rate: 75 (01/03 1125)  Labs: Recent Labs    11/28/20 0201 11/29/20 0317 11/30/20 0523  HGB 9.4* 9.7* 9.1*  HCT 29.3* 30.6* 30.2*  PLT 228 253 235  HEPARINUNFRC 0.43 0.41 0.37  CREATININE 1.39* 1.43* 1.37*    Estimated Creatinine Clearance: 52.7 mL/min (A) (by C-G formula based on SCr of 1.37 mg/dL (H)).   Medical History: Past Medical History:  Diagnosis Date  . Adult ADHD   . AICD (automatic cardioverter/defibrillator) present   . CAD (coronary artery disease)    Cath 2008, all grafts patent // Cath 2017 - all grafts patent   . 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 Carotid artery disease (HCC)    Doppler, February, 2012, mild plaque, 0-39% bilateral, plan followup in one year  . Dyspepsia   . Ejection fraction < 50%   . Elevated PSA 02/15/2011  . Esophageal stricture   . GERD (gastroesophageal reflux disease)   . HTN (hypertension)   . Hx of CABG    1991  . Hyperlipidemia   . 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 Malignant neoplasm of tonsil (HCC)   . Myocardial infarction (HCC) 1998  . Prostate cancer (HCC) 2012   IMRT, ADT  . Thrombosis of arm    Left  . Venous thrombosis    Left arm      Assessment: 75 yo M with large laminated mural apical thrombus found on TTE 11/22/20, originally admitted for HF exacerbation. Pharmacy consulted for IV heparin. No AC PTA.   Heparin  level continues to be therapeutic at 0.37 on 1250 units/hr. Hgb stable 9's, PLTs WNL. No s/sx of bleeding or infusion issues.   Goal of Therapy:  Heparin level 0.3-0.7 units/ml Monitor platelets by anticoagulation protocol: Yes   Plan:  Continue heparin infusion 1250 units/hr Monitor daily HL, CBC/plt Monitor for signs/symptoms of bleeding  F/u long term anticoagulation plan    Leota Sauers Pharm.D. CPP, BCPS Clinical Pharmacist [phone] 11/30/2020 12:30 PM    Please check [email or web address] for unit specific pharmacy phone numbers.

## 2020-11-30 NOTE — Progress Notes (Signed)
PROGRESS NOTE  [name] [id number] DOB: 11-28-1946 DOA: 11/17/2020 PCP: Hoyt Koch, MD  Brief History   Memory Argue [name] a 75 y.o.malewith medical history significant ofCAD status post CABG and PCI, chronic combined systolic and diastolic CHF/ischemic cardiomyopathy with LVEF 20-25% and history of VT status post ICD, hypertension, hyperlipidemia,CKD stage IIIa,history of prostate and tonsillar cancer, history of DVT presenting to the ED with complaints of shortness of breath.Patient reports dyspnea on exertion and cough for the past 3 to 4 days. He was having low-grade fevers earlier but has not had them for the past few days. He has also had 3 episodes of vomiting over the past week which has now resolved. Denies abdominal pain or diarrhea. States his ICD had shocked him prior to his recent hospitalization but he has not had any issues lately. He is vaccinated against Covid.   ED Course:Afebrile.Tachypneic with respiratory rate in the 20s.Not tachycardic.Not hypoxic. WBC 7.0, hemoglobin 10.3, hematocrit 33.9, platelet 186K.Sodium 140, potassium 3.7, chloride 107, bicarb 22, BUN 27, creatinine 1.7, glucose 106. High-sensitivity troponin mildly elevated but stable (29 >29).BNP significantly elevated at 3663. SARS-CoV-2 PCR test and influenza panel both negative.  Chest x-ray showing cardiomegaly and small effusions in the posterior costophrenic angles; no frank edema.Patient was given IV Lasix 20 mg.  Vascular surgery was consulted due to the patient's carotid artery stenosis, Left ICA stenosis >80%. The patient was evaluated by Dr. Donzetta Matters on 11/24/2020. He determined that if the patient were to meet criteria for LVAD placement, it would make sense to perform CEA during the same procedure. However, if the patient were not found to be a candidate for the LVAD procedure, then he would not be seen as a candidate for CEA.  On 11/27/2020 the patient  was able to ambulate down the hall without difficulty on room air maintaining SaO2 of 95%.   Consultants  . Cardiology . Vascular Surgery . LVAD Team. . Heart Failure Team  Procedures  . None  Antibiotics   Anti-infectives (From admission, onward)   None     Subjective  The patient is sitting up at bedisde following working with therapy this morning. He states that he is more fatigued today.  Objective   Vitals:  Vitals:   11/30/20 0730 11/30/20 1125  BP: 130/77 108/62  Pulse: 75 75  Resp: 20 20  Temp: 98.2 F (36.8 C) 97.6 F (36.4 C)  SpO2: 95% 99%    Exam:  Constitutional:  . The patient is awake, alert, and oriented x 3. Fatigued. Respiratory:  . No increased work of breathing. . No wheezes, rales, or rhonchi . No tactile fremitus Cardiovascular:  . Regular rate and rhythm . No murmurs, ectopy, or gallups. . No lateral PMI. No thrills. Abdomen:  . Abdomen is soft, non-tender, non-distended . No hernias, masses, or organomegaly . Normoactive bowel sounds.  Musculoskeletal:  . No cyanosis, clubbing, or edema Skin:  . No rashes, lesions, ulcers . palpation of skin: no induration or nodules Neurologic:  . CN 2-12 intact . Sensation all 4 extremities intact Psychiatric:  . Mental status o Mood, affect appropriate o Orientation to person, place, time  . judgment and insight appear intact  I have personally reviewed the following:   Today's Data  . Vitals, BMP, CBC, methemeglobin  Imaging  . CT head without contrast . Vascular ultrasound . CT Chest without contrast  Cardiology Data  . EKG . Echocardiogram (11/22/2020)  Scheduled Meds: . amiodarone  200 mg  Oral BID  . aspirin EC  81 mg Oral Daily  . atorvastatin  80 mg Oral QPM  . buPROPion  300 mg Oral Daily  . Chlorhexidine Gluconate Cloth  6 each Topical Daily  . escitalopram  20 mg Oral Daily  . feeding supplement  237 mL Oral BID BM  . mexiletine  150 mg Oral Q8H  . multivitamin  with minerals  1 tablet Oral Daily  . pantoprazole  40 mg Oral QAC breakfast  . potassium chloride  40 mEq Oral Once  . sodium chloride flush  10-40 mL Intracatheter Q12H  . sodium chloride flush  3 mL Intravenous Q12H  . zolpidem  5 mg Oral QHS   Continuous Infusions: . heparin 1,250 Units/hr (11/30/20 0042)  . milrinone 0.375 mcg/kg/min (11/30/20 0137)    Principal Problem:   VT (ventricular tachycardia) (HCC) Active Problems:   Hyperlipidemia with target LDL less than 70   Carotid artery disease (HCC)   Acute on chronic combined systolic and diastolic CHF (congestive heart failure) (HCC)   CHF exacerbation (HCC)   Acute on chronic kidney failure (HCC)   LOS: 13 days   A & P  Acute on chronic combined systolic and diastolic CHF: Patient presented with signs of fluid overload.  Chest x-ray shows cardiomegaly and small effusion in the posterior costophrenic angles. On admission the patient was found to have bibasilar rales, JVD and significantly elevated BNP of 3663. This was despite the use of milrinone drip at home. Echo from 10/24/2020 showing severely reduced ejection fraction of 20 to 25%, grade 2 diastolic dysfunction, moderate to severe MR. RHC on 11/23/2020 demonstrated mild pulmonary venous hypertension, improved volume status, and acceptable cardiac output on milrinone. The pulmonary artery pulsatility index was also found to be acceptable at 4.8. (Pulmonary artery pressure/right atrial pressure). Cardiology discontinued Lasix, Entresto, and Coreg. The patient has been continued on milrinone. Volume will be carefully monitored along with electrolytes and creatinine. Magnesium will be kept greater than 2 and potassium greater than 4. The patient is being evaluated by the VAD team, CTS and vascular surgery. Consideration is being given to CIR, but it is not clear whether this will be pursued before or after VAD. The patient will be discussed in conference today.  Monomorphic  ventricular tachycardia: The patient was given loading dose of amiodarone and then was placed on a drip. He has been continued on amiodarone 400 mg twice daily and mexiletine, although today that has been reduced to 400 mg amiodarone daily and mexiletine 150 mg bid. The patient underwent recent left heart cath on 10/23/2020 without any targets for revascularization. The assistance of cardiology and EP is appreciated.  LV thrombus: Noted on echo on 12/26. Shows 20% ejection fraction with LV thrombus.  Severe MR.  Moderate RV dysfunction. Continue IV heparin as per pharmacy. He has not been converted to coumadin due to possible VAD.  Carotid Stenosis: Doppler demonstrated >80% stenosis of the left ICA. Vascular surgery was consulted. The patient was evaluated by Dr. [name] on 11/24/2020. He determined that if the patient were to meet criteria for LVAD placement, it would make sense to perform CEA during the same procedure. However, if the patient were not found to be a candidate for the LVAD procedure, then he would not be seen as a candidate for CEA.  AKI on CKD stage IIIa: Kidney function improving. Creatinine today is down to 1.44. Baseline creatinine is 1.1. Entresto and Lasix was discontinued by cardiology. Continue to  monitor creatinine, electrolytes, and volume status. Avoid nephrotoxic substances and hypotension.  Severe MR: Status post RHC on 12/27. Appreciate cardiology assistance.  History of coronary artery disease status post CABG and PCI:  Patient denies ACS symptoms.  Troponin mildly elevated but stable 29x2.  Suspected demand ischemia from decompensated CHF. Continue aspirin, and statin  Hypokalemia: Resolved. Monitor.  Hypomagnesemia: Resolved  Hypertension: Stable.  Continue Coreg and Entresto. Monitor blood pressure closely.  Hyperlipidemia: Continue Lipitor  GERD: Continue PPI  Depression/anxiety: Continue Lexapro, Wellbutrin and Ambien  Hypoalbuminemia: Prealbumin:  13. Dietitian consulted.  I have seen and examined this patient myself. I have spent 32 minutes in his evaluation and care.  DVT prophylaxis: Heparin Code Status: None Family Communication: None available. Disposition Plan: T.B.D. Depends upon plans for LVAD and CEA.  Status is: Inpatient  Dispo: The patient is from: Home  Anticipated d/c is to: Home  Anticipated d/c date is: TBD  Patient currently is not medically stable to d/c.  Randy Whitener, DO [facility] Direct contact: see [email or web address]  7PM-7AM contact night coverage as above 11/30/2020, 2:32 PM  LOS: 8 days

## 2020-11-30 NOTE — Progress Notes (Signed)
Occupational Therapy Treatment Patient Details Name: [name] MRN: [id number] DOB: 12/25/1945 Today's Date: 11/30/2020    History of present illness 75 y.o. 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 EF 20% with LV thrombus, severe MR, moderate RV dysfunction. Being worked up for LVAD.   OT comments  Patient continues to make progress towards goals in skilled OT session. Patient's session encompassed functional transfers to the chair and standing exercises in order to progress in activity tolerance and endurance. Pt requires step by step cues for all transfers to ensure safety, and continues to require external cues to acknowledge fatigue (evident by posterior lean, unsteady in standing). Discharge remains appropriate in order to increase activity tolerance for safe discharge home, therapy will continue to follow.    Follow Up Recommendations  CIR    Equipment Recommendations  3 in 1 bedside commode    Recommendations for Other Services      Precautions / Restrictions Precautions Precautions: Fall Restrictions Weight Bearing Restrictions: No       Mobility Bed Mobility Overal bed mobility: Needs Assistance Bed Mobility: Supine to Sit     Supine to sit: Supervision;HOB elevated        Transfers Overall transfer level: Needs assistance Equipment used: Rolling walker (2 wheeled) Transfers: Sit to/from Stand Sit to Stand: Min assist         General transfer comment: MinA to power up and initially stabilize    Balance Overall balance assessment: Needs assistance Sitting-balance support: Feet supported;No upper extremity supported Sitting balance-Leahy Scale:  Fair Sitting balance - Comments: Min guard=supervision for safety; able to adjust socks without difficulty.   Standing balance support: During functional activity;Bilateral upper extremity supported Standing balance-Leahy Scale: Poor Standing balance comment: reliant on RW and external support                           ADL either performed or assessed with clinical judgement   ADL Overall ADL's : Needs assistance/impaired     Grooming: Min guard;Sitting Grooming Details (indicate cue type and reason): min guard for safety in sitting.             Lower Body Dressing: Min guard Lower Body Dressing Details (indicate cue type and reason): Pt able to present LE to figure to safely don/doff socks limitations with sitting stability, min guard for safety.             Functional mobility during ADLs: Minimal assistance;Cueing for sequencing;Cueing for safety;Rolling walker General ADL Comments: pt continues to make progress towards goals, with safety and stability being limiting factors     Vision       Perception     Praxis      Cognition Arousal/Alertness: Awake/alert Behavior During Therapy: Flat affect Overall Cognitive Status: Impaired/Different from baseline Area of Impairment: Memory;Safety/judgement;Awareness;Problem solving;Following commands                     Memory: Decreased short-term memory Following Commands: Follows multi-step commands inconsistently;Follows one step commands with increased time Safety/Judgement: Decreased awareness of deficits;Decreased awareness of safety Awareness: Intellectual Problem Solving: Requires verbal cues General Comments: Poor awareness of safety/deficits. Poor STM.  Not able to self monitor activity well. High fall risk.        Exercises     Shoulder Instructions       General Comments      Pertinent Vitals/ Pain       Pain Assessment: No/denies pain  Home Living                                           Prior Functioning/Environment              Frequency  Min 3X/week        Progress Toward Goals  OT Goals(current goals can now be found in the care plan section)  Progress towards OT goals: Progressing toward goals  Acute Rehab OT Goals Patient Stated Goal: did not state OT Goal Formulation: With patient Time For Goal Achievement: 12/09/20 Potential to Achieve Goals: [place] Discharge plan remains appropriate    Co-evaluation                 AM-PAC OT "6 Clicks" Daily Activity     Outcome Measure   Help from another person eating meals?: A Little Help from another person taking care of personal grooming?: A Little Help from another person toileting, which includes using toliet, bedpan, or urinal?: A Little Help from another person bathing (including washing, rinsing, drying)?: A Lot Help from another person to put on and taking off regular upper body clothing?: A Little Help from another person to put on and taking off regular lower body clothing?: A Lot 6 Click Score: 16    End of Session Equipment Utilized During Treatment: Gait belt;Rolling walker  OT Visit Diagnosis: Unsteadiness on feet (R26.81);Muscle weakness (generalized) (M62.81);Repeated falls (R29.6);Other abnormalities of gait and mobility (R26.89);History of falling (Z91.81)   Activity Tolerance Patient tolerated treatment well   Patient Left in chair;with call bell/phone within reach;with chair alarm set   Nurse Communication Mobility status        Time: [id number] OT Time Calculation (min): 21 min  Charges: OT General Charges $OT Visit: 1 Visit OT Treatments $Self Care/Home Management : 8-22 mins   Corinne Ports E. Icy Fuhrmann, COTA/L Acute Rehabilitation Services [place] 11/30/2020, 4:40 PM

## 2020-11-30 NOTE — Progress Notes (Addendum)
Patient ID: [name], male   DOB: [birth year], 75 y.o.   MRN: [id number]    Advanced Heart Failure Rounding Note   Subjective:    Milrinone started 12/25.   ECHO: 12/26 EF 20% with LV thrombus. Severe MR. Moderate RV dysfunction. Now on heparin. No bleeding  RHC 11/23/20 on milrinone 0.25 mcg.  RA = 6 RV = 56/10 PA = 51/22 (33) PCW = 19 Thermal cardiac output/index = 5.5/2.7 Fick CO/CI = 6.2/3.0 PVR = 2.3 WU Ao sat = 94% PA sat = 59%, 61% PAPi = 4.8   Yesterday milrinone increased to 0.375 mcg. CO-OX 73%   Denies SOB. Feeling ok.    Objective:   Weight Range:  Vital Signs:   Temp:  [98 F (36.7 C)-99.1 F (37.3 C)] 98.2 F (36.8 C) (01/03 0730) Pulse Rate:  [68-75] 75 (01/03 0730) Resp:  [18-22] 20 (01/03 0730) BP: (110-130)/(63-77) 130/77 (01/03 0730) SpO2:  [95 %] 95 % (01/03 0730) Weight:  [79.3 kg] 79.3 kg (01/03 0448) Last BM Date: 11/28/20  Weight change: Filed Weights   11/28/20 0415 11/29/20 0322 11/30/20 0448  Weight: 78.4 kg 78.1 kg 79.3 kg    Intake/Output:   Intake/Output Summary (Last 24 hours) at 11/30/2020 0920 Last data filed at 11/30/2020 0721 Gross per 24 hour  Intake 966.93 ml  Output 750 ml  Net 216.93 ml    CVP 2  Physical Exam: General:   No resp difficulty HEENT: normal Neck: supple. no JVD. Carotids 2+ bilat; no bruits. No lymphadenopathy or thryomegaly appreciated. Cor: PMI nondisplaced. Regular rate & rhythm. No rubs, gallops or murmurs. Lungs: clear Abdomen: soft, nontender, nondistended. No hepatosplenomegaly. No bruits or masses. Good bowel sounds. Extremities: no cyanosis, clubbing, rash, edema Neuro: alert & orientedx3, cranial nerves grossly intact. moves all 4 extremities w/o difficulty. Affect pleasant  Telemetry:  SR 70s .  Labs: Basic Metabolic Panel: Recent Labs  Lab 11/24/20 0450 11/25/20 0525 11/26/20 0520 11/27/20 0520 11/28/20 0201 11/29/20 0317 11/30/20 0523  NA 138 136 138 140 140 140 139   K 4.2 4.4 3.9 4.1 4.0 4.3 3.6  CL 107 105 106 107 109 106 104  CO2 23 22 23 24  21* 25 23  GLUCOSE 91 164* 113* 108* 99 102* 97  BUN 11 10 12 11 14 14 16   CREATININE 1.50* 1.43* 1.48* 1.38* 1.39* 1.43* 1.37*  CALCIUM 8.4* 9.1 8.4* 8.7* 8.6* 8.8* 8.7*  MG 2.2 2.0  --   --   --   --   --     Liver Function Tests: Recent Labs  Lab 11/24/20 0450  AST 40  ALT 127*  ALKPHOS 110  BILITOT 1.5*  PROT 5.5*  ALBUMIN 2.9*   No results for input(s): LIPASE, AMYLASE in the last 168 hours. No results for input(s): AMMONIA in the last 168 hours.  CBC: Recent Labs  Lab 11/24/20 0450 11/25/20 0525 11/26/20 0520 11/27/20 0520 11/28/20 0201 11/29/20 0317 11/30/20 0523  WBC 5.3   < > 5.2 6.6 6.3 6.2 6.0  NEUTROABS 3.3  --   --   --   --   --   --   HGB 9.8*   < > 9.3* 9.1* 9.4* 9.7* 9.1*  HCT 31.0*   < > 29.2* 30.2* 29.3* 30.6* 30.2*  MCV 85.4   < > 84.1 85.8 83.5 84.5 85.6  PLT 244   < > 228 234 228 253 235   < > = values in this interval  not displayed.    Cardiac Enzymes: No results for input(s): CKTOTAL, CKMB, CKMBINDEX, TROPONINI in the last 168 hours.  BNP: BNP (last 3 results) Recent Labs    11/17/20 1645  BNP 3,663.4*    ProBNP (last 3 results) No results for input(s): PROBNP in the last 8760 hours.    Other results:  Imaging: No results found.   Medications:     Scheduled Medications: . amiodarone  200 mg Oral BID  . aspirin EC  81 mg Oral Daily  . atorvastatin  80 mg Oral QPM  . buPROPion  300 mg Oral Daily  . Chlorhexidine Gluconate Cloth  6 each Topical Daily  . escitalopram  20 mg Oral Daily  . feeding supplement  237 mL Oral BID BM  . mexiletine  150 mg Oral Q8H  . multivitamin with minerals  1 tablet Oral Daily  . pantoprazole  40 mg Oral QAC breakfast  . sodium chloride flush  10-40 mL Intracatheter Q12H  . sodium chloride flush  3 mL Intravenous Q12H  . zolpidem  5 mg Oral QHS    Infusions: . heparin 1,250 Units/hr (11/30/20 0042)  .  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 Severe MR. Moderate RV dysfunction. Now on heparin. No bleeding - NYHA IV.  Low output physiology confirmed Co-ox (12/25) 44%. - RHC 11/24/20 on RA 6, PCWP 19, CO 6.2/3.0  - Milrinone started on 12/25. Cath was on milrinone 0.25 mcg with stable hemodynamcis on cath.  - Co-ox stable. 73%. Continue milrinone to 0.375.  - CVP low. Volume status remains stable. Hold off on spiro.  -  No bb with shock - Off Entresto with worsening renal function.  - RV has moderate dysfunction but CVP low. - Working up for LVAD. 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 He seems to be doing better in terms of mobility.  Potential LVAD + L CEA next week, will discuss at [facility] today.    2.Monomorphic ventricular tachycardia -No further recurrence. ICD VT zones have been adjusted. Continue amiodarone.  - Keep K> 4.0 Mg > 2.0 - Recent left heart catheterization on 10/23/2020 without any targets for revascularization - No VT.  - Continue amiodarone 200 mg daily and continue  + mexiletine 150 mg twice a day.    3.CAD status post CABG -Recent left heart cath with severe three-vessel CAD. -He has patent vein grafts. (LIMA -> LAD, SVG-> RCA, SVG -> OM2 -> OM3) - No chest pain.  -Continue aspirin and atorvastatin.   4.Severe mitral regurgitation - pending VAD  5. AKI  - due to cardiorenal/ATN - baseline 1.1 - Plateued at 1.3-1.4 .   6. Hypokalemia - K stable  7. Apical LV mural thrombus - heparin started. Holding on coumadin due to possible VAD  8. Hypoalbuminemia.  Prealbumin  13 .  Consult dietitian   9. Carotid Disease - He had >80% LICA stenosis on pre-surgical carotid dopplers. VVShas seen, if gets LVAD will need to fix concomitantly. - On atorvastatin 80 mg dialy.   10. Deconditioning PT following-- Concerned about his safety.   11. Memory Issues  CT head with mild small vessel disease and cerebral atrophy.  Plan to discuss at [facility] Monday possible VAD.   Length of Stay: [place]  11/30/2020, 9:20 AM  Advanced Heart Failure Team Pager [phone] (M-F; 7a - 4p)  Please contact [facility] for night-coverage after hours (4p -7a ) and weekends on [email or web address]  Patient seen and examined with the above-signed Advanced Practice Provider and/or Housestaff. I personally reviewed laboratory data, imaging studies and relevant notes. I independently examined the patient and formulated the important aspects of the plan. I have edited the note to reflect any of my changes or salient points. I have personally discussed the plan with the patient and/or family.  Remains tenuous. Milrinone increased over the weekend due to persistently low co-ox. Co-ox 49%-> 73%. CVP low. Feels ok. Remains weak. Functional capacity has improved with PT work however significant concerns about his cognitive capacity.   General:  Elderly male. Sitting up in bed. No resp difficulty HEENT: normal Neck: supple. no JVD. Carotids 2+ bilat; no bruits. No lymphadenopathy or thryomegaly appreciated. Cor: PMI nondisplaced. Regular rate & rhythm. 3/6 MR Lungs: clear Abdomen: soft, nontender, nondistended. No hepatosplenomegaly. No bruits or masses. Good bowel sounds. Extremities: no cyanosis, clubbing, rash, edema Neuro: alert & orientedx3, cranial nerves grossly intact. moves all 4 extremities w/o difficulty. Affect pleasant + tremor  I have reviewed his w/u in depth and discussed personally with PT and VAD coordinators. Given all the strikes he has against him (CKD, poor preadmission  functional capacity, cognitive dysfunction, poor nutritional status_ I do not think his VAD outcome will be promising. Will discuss in VAD meeting tomorrow.   Glori Bickers, MD  4:40 PM

## 2020-11-30 NOTE — Progress Notes (Signed)
[id number] Checked with pt to walk a second time today. Pt stated he sat in recliner for hours after walk this morning and is tired. Not up to walking at this time. Wife and pt did request to see a priest. Micah Flesher to [place] office. Left message on answering machine. Paged Chaplain here and she is going to work on request. Will continue to follow pt. Luetta Nutting RN BSN 11/30/2020 2:04 PM

## 2020-11-30 NOTE — Progress Notes (Signed)
   11/30/20 1400  Clinical Encounter Type  Visited With Health care provider  Visit Type Initial  Referral From Nurse  Consult/Referral To Chaplain  The chaplain called Father Ree Kida to respond to the patients' request. The chaplain left a voice message and will await a response. Because of the time of day, the request may not be answered before EOD. The chaplain has notified other chaplains of the request and will let PRN chaplain know as well. This chaplain will follow up as needed.

## 2020-11-30 NOTE — Progress Notes (Signed)
Physical Therapy Treatment Patient Details Name: [name] MRN: [id number] DOB: August 26, 1946 Today's Date: 11/30/2020    History of Present Illness 75 y.o. 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 EF 20% with LV thrombus, severe MR, moderate RV dysfunction. Being worked up for LVAD.    PT Comments    Pt progressing towards his physical therapy goals. Requiring min assist for transfers, moderate assist for ambulation. Ambulating 200 ft, then 150 ft with a walker and seated rest break in between. Pt with poor self regulation of activity tolerance, increasing cadence during ambulation despite max cueing, ultimately staggering forward and requiring an RN to assist pt into a chair in the hallway to prevent fall. Pt demonstrates cognitive deficits, gait abnormalities, poor balance, and decreased endurance. Continue to recommend comprehensive inpatient rehab (CIR) for post-acute therapy needs.    Follow Up Recommendations  CIR     Equipment Recommendations  3in1 (PT)    Recommendations for Other Services       Precautions / Restrictions Precautions Precautions: Fall Restrictions Weight Bearing Restrictions: No    Mobility  Bed Mobility               General bed mobility comments: OOB in chair  Transfers Overall transfer level: Needs assistance Equipment used: Rolling walker (2 wheeled) Transfers: Sit to/from Stand Sit to Stand: Min assist         General transfer comment: MinA to power up and initially stabilize  Ambulation/Gait Ambulation/Gait assistance: Mod assist Gait Distance (Feet): 350 Feet (200", 150") Assistive device: Rolling walker (2 wheeled) Gait  Pattern/deviations: Step-through pattern;Narrow base of support;Scissoring;Shuffle;Decreased dorsiflexion - right;Decreased dorsiflexion - left     General Gait Details: Pt with shuffling gait pattern, increased cadence, decreased bilateral foot clearance, and increased trunk flexion. Cues provided for controlling momentum, upward gaze and walker proximity, but pt having difficulty performing. Pt with poor self regulation of activity level, ultimately staggering and requiring a seated rest break. Up to moderate assist provided for turns and obstacle negotiation   Stairs             Wheelchair Mobility    Modified Rankin (Stroke Patients Only)       Balance Overall balance assessment: Needs assistance Sitting-balance support: Feet supported;No upper extremity supported Sitting balance-Leahy Scale: Fair     Standing balance support: During functional activity;Bilateral upper extremity supported Standing balance-Leahy Scale: Poor Standing balance comment: reliant on RW and external support                            Cognition Arousal/Alertness: Awake/alert Behavior During Therapy: Flat affect Overall Cognitive Status: Impaired/Different from baseline Area of Impairment: Memory;Safety/judgement;Awareness;Problem solving;Following commands                     Memory: Decreased short-term memory Following Commands: Follows multi-step commands inconsistently;Follows one step commands with increased time Safety/Judgement: Decreased awareness of deficits;Decreased awareness of safety Awareness: Intellectual Problem Solving: Requires verbal cues General Comments: Poor awareness of safety/deficits. Poor STM. Not able to self monitor activity well. High fall risk.      Exercises General Exercises - Lower Extremity Long Arc Quad: Both;10  reps;Seated Hip Flexion/Marching: Both;10 reps;Seated    General Comments        Pertinent Vitals/Pain Pain  Assessment: No/denies pain    Home Living                      Prior Function            PT Goals (current goals can now be found in the care plan section) Acute Rehab PT Goals Patient Stated Goal: did not state Potential to Achieve Goals: Good Progress towards PT goals: Progressing toward goals    Frequency    Min 3X/week      PT Plan Current plan remains appropriate    Co-evaluation              AM-PAC PT "6 Clicks" Mobility   Outcome Measure  Help needed turning from your back to your side while in a flat bed without using bedrails?: None Help needed moving from lying on your back to sitting on the side of a flat bed without using bedrails?: None Help needed moving to and from a bed to a chair (including a wheelchair)?: A Little Help needed standing up from a chair using your arms (e.g., wheelchair or bedside chair)?: A Little Help needed to walk in hospital room?: A Lot Help needed climbing 3-5 steps with a railing? : A Lot 6 Click Score: 18    End of Session Equipment Utilized During Treatment: Gait belt Activity Tolerance: Patient tolerated treatment well Patient left: in chair;with call bell/phone within reach;with chair alarm set Nurse Communication: Mobility status PT Visit Diagnosis: Unsteadiness on feet (R26.81);Other abnormalities of gait and mobility (R26.89);Muscle weakness (generalized) (M62.81);Difficulty in walking, not elsewhere classified (R26.2);History of falling (Z91.81)     Time: [id number] PT Time Calculation (min) (ACUTE ONLY): 20 min  Charges:  $Gait Training: 8-22 mins                     Wyona Almas, PT, DPT Acute Rehabilitation Services Pager [phone] Office [phone]    Deno Etienne 11/30/2020, 10:50 AM

## 2020-12-01 DIAGNOSIS — I472 Ventricular tachycardia: Secondary | ICD-10-CM | POA: Diagnosis not present

## 2020-12-01 LAB — BASIC METABOLIC PANEL WITH GFR
Anion gap: 9 (ref 5–15)
BUN: 16 mg/dL (ref 8–23)
CO2: 23 mmol/L (ref 22–32)
Calcium: 8.7 mg/dL — ABNORMAL LOW (ref 8.9–10.3)
Chloride: 105 mmol/L (ref 98–111)
Creatinine, Ser: 1.31 mg/dL — ABNORMAL HIGH (ref 0.61–1.24)
GFR, Estimated: 57 mL/min — ABNORMAL LOW
Glucose, Bld: 101 mg/dL — ABNORMAL HIGH (ref 70–99)
Potassium: 3.8 mmol/L (ref 3.5–5.1)
Sodium: 137 mmol/L (ref 135–145)

## 2020-12-01 LAB — COOXEMETRY PANEL
Carboxyhemoglobin: 1.4 % (ref 0.5–1.5)
Methemoglobin: 0.6 % (ref 0.0–1.5)
O2 Saturation: 50.1 %
Total hemoglobin: 8.7 g/dL — ABNORMAL LOW (ref 12.0–16.0)

## 2020-12-01 LAB — CBC
HCT: 29.9 % — ABNORMAL LOW (ref 39.0–52.0)
Hemoglobin: 8.9 g/dL — ABNORMAL LOW (ref 13.0–17.0)
MCH: 25.5 pg — ABNORMAL LOW (ref 26.0–34.0)
MCHC: 29.8 g/dL — ABNORMAL LOW (ref 30.0–36.0)
MCV: 85.7 fL (ref 80.0–100.0)
Platelets: 232 10*3/uL (ref 150–400)
RBC: 3.49 MIL/uL — ABNORMAL LOW (ref 4.22–5.81)
RDW: 15.8 % — ABNORMAL HIGH (ref 11.5–15.5)
WBC: 6.7 10*3/uL (ref 4.0–10.5)
nRBC: 0 % (ref 0.0–0.2)

## 2020-12-01 LAB — HEPARIN LEVEL (UNFRACTIONATED): Heparin Unfractionated: 0.4 IU/mL (ref 0.30–0.70)

## 2020-12-01 NOTE — Progress Notes (Addendum)
Patient ID: [name], male   DOB: [birth year], 75 y.o.   MRN: [id number]    Advanced Heart Failure Rounding Note   Subjective:    Milrinone started 12/25.   ECHO: 12/26 EF 20% with LV thrombus. Severe MR. Moderate RV dysfunction. Now on heparin. No bleeding  RHC 11/23/20 on milrinone 0.25 mcg.  RA = 6 RV = 56/10 PA = 51/22 (33) PCW = 19 Thermal cardiac output/index = 5.5/2.7 Fick CO/CI = 6.2/3.0 PVR = 2.3 WU Ao sat = 94% PA sat = 59%, 61% PAPi = 4.8   Remains on milrinone 0.375 mcg. CO-OX 50%.   Denies SOB. Disappointed he is not eligible for VAD.      Objective:   Weight Range:  Vital Signs:   Temp:  [97.6 F (36.4 C)-99.4 F (37.4 C)] 98.9 F (37.2 C) (01/04 0724) Pulse Rate:  [70-79] 70 (01/04 0724) Resp:  [20-22] 21 (01/04 0724) BP: (108-148)/(62-78) 123/65 (01/04 0724) SpO2:  [94 %-99 %] 94 % (01/04 0724) Weight:  [77.4 kg] 77.4 kg (01/04 0500) Last BM Date: 12/01/20  Weight change: Filed Weights   11/29/20 0322 11/30/20 0448 12/01/20 0500  Weight: 78.1 kg 79.3 kg 77.4 kg    Intake/Output:   Intake/Output Summary (Last 24 hours) at 12/01/2020 0947 Last data filed at 12/01/2020 0858 Gross per 24 hour  Intake 1908.34 ml  Output 990 ml  Net 918.34 ml    CVP 5 Physical Exam: General:   No resp difficulty HEENT: normal Neck: supple. no JVD. Carotids 2+ bilat; no bruits. No lymphadenopathy or thryomegaly appreciated. Cor: PMI nondisplaced. Regular rate & rhythm. No rubs, gallops or murmurs. Lungs: clear Abdomen: soft, nontender, nondistended. No hepatosplenomegaly. No bruits or masses. Good bowel sounds. Extremities: no cyanosis, clubbing, rash, edema. RUE PICC Neuro: alert & orientedx3, cranial nerves grossly intact. moves all 4 extremities w/o difficulty. Affect pleasant Telemetry:  SR 80s  Labs: Basic Metabolic Panel: Recent Labs  Lab 11/25/20 0525 11/26/20 0520 11/27/20 0520 11/28/20 0201 11/29/20 0317 11/30/20 0523  12/01/20 0535  NA 136   < > 140 140 140 139 137  K 4.4   < > 4.1 4.0 4.3 3.6 3.8  CL 105   < > 107 109 106 104 105  CO2 22   < > 24 21* _0 GLUCOSE 164*   < > 108* 99 102* 97 101*  BUN 10   < > _1 CREATININE 1.43*   < > 1.38* 1.39* 1.43* 1.37* 1.31*  CALCIUM 9.1   < > 8.7* 8.6* 8.8* 8.7* 8.7*  MG 2.0  --   --   --   --   --   --    < > = values in this interval not displayed.    Liver Function Tests: No results for input(s): AST, ALT, ALKPHOS, BILITOT, PROT, ALBUMIN in the last 168 hours. No results for input(s): LIPASE, AMYLASE in the last 168 hours. No results for input(s): AMMONIA in the last 168 hours.  CBC: Recent Labs  Lab 11/27/20 0520 11/28/20 0201 11/29/20 0317 11/30/20 0523 12/01/20 0535  WBC 6.6 6.3 6.2 6.0 6.7  HGB 9.1* 9.4* 9.7* 9.1* 8.9*  HCT 30.2* 29.3* 30.6* 30.2* 29.9*  MCV 85.8 83.5 84.5 85.6 85.7  PLT 234 228 253 235 232    Cardiac Enzymes: No results for input(s): CKTOTAL, CKMB, CKMBINDEX, TROPONINI in the last 168 hours.  BNP: BNP (last 3 results) Recent Labs  11/17/20 1645  BNP 3,663.4*    ProBNP (last 3 results) No results for input(s): PROBNP in the last 8760 hours.    Other results:  Imaging: No results found.   Medications:     Scheduled Medications: . amiodarone  200 mg Oral BID  . aspirin EC  81 mg Oral Daily  . atorvastatin  80 mg Oral QPM  . buPROPion  300 mg Oral Daily  . Chlorhexidine Gluconate Cloth  6 each Topical Daily  . escitalopram  20 mg Oral Daily  . feeding supplement  237 mL Oral BID BM  . mexiletine  150 mg Oral Q8H  . multivitamin with minerals  1 tablet Oral Daily  . pantoprazole  40 mg Oral QAC breakfast  . sodium chloride flush  10-40 mL Intracatheter Q12H  . sodium chloride flush  3 mL Intravenous Q12H  . zolpidem  5 mg Oral QHS    Infusions: . heparin 1,250 Units/hr (11/30/20 1829)  . milrinone 0.375 mcg/kg/min (12/01/20 0221)    PRN Medications: acetaminophen **OR**  acetaminophen, guaiFENesin-dextromethorphan, loperamide, ondansetron (ZOFRAN) IV, sodium chloride flush   Assessment/Plan:   1.Acute on chronic systolic heart failure due to iCM, EF 20-25% -> cardiogenic shock - Echo 11/21 EF 20-25% mild RV dysfunction moderate to severe MR - EF 20% with LV thrombus. Severe MR. Moderate RV dysfunction. Now on heparin. No bleeding - NYHA IV.  Low output physiology confirmed Co-ox (12/25) 44%. - RHC 11/24/20 on RA 6, PCWP 19, CO 6.2/3.0  - Milrinone started on 12/25. Cath was on milrinone 0.25 mcg with stable hemodynamcis on cath.  - Continue milrinone to 0.375. CO-OX 50%.  -  Hold off on spiro.  -  No bb with shock - Off Entresto with worsening renal function.  - RV has moderate dysfunction but CVP low. - Working up for LVAD. 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  - MRB discussed and he is not a candidate for VAD - Consult TOC for HH. Change to single lumen PICC  2.Monomorphic ventricular tachycardia -No further recurrence. ICD VT zones have been adjusted. Continue amiodarone.  - Keep K> 4.0 Mg > 2.0 - Recent left heart catheterization on 10/23/2020 without any targets for revascularization - No VT.  - Continue amiodarone 200 mg daily and continue  + mexiletine 150 mg twice a day.    3.CAD status post CABG -Recent left heart cath with severe three-vessel CAD. -He has patent vein grafts. (LIMA -> LAD, SVG-> RCA, SVG -> OM2 -> OM3) - No chest pain.  -Continue aspirin and atorvastatin.   4.Severe mitral regurgitation - pending VAD  5. AKI  - due to cardiorenal/ATN - baseline 1.1 - Plateued at 1.3-1.4 .   6. Hypokalemia - K stable  7. Apical LV mural thrombus - heparin started. Holding on coumadin due to possible VAD  8. Hypoalbuminemia.  Prealbumin 13 .  Consult dietitian   9. Carotid Disease - He had >58% LICA stenosis on pre-surgical carotid dopplers. VVShas seen, if gets LVAD will  need to fix concomitantly. - On atorvastatin 80 mg dialy.   10. Deconditioning PT following-- Concerned about his safety.   11. Memory Issues  CT head with mild small vessel disease and cerebral atrophy.   Not a VAD candidate with memory impairment and deconditioning/mobility safety issues. Discussed with him. Refer to [facility] for [facility] with milrinone. We discussed that milrinone is for palliative purposes.   Will ask [employer] to deactivate ICD.  Palliative Care has met with him and he is a DNR.   Length of Stay: [place]  12/01/2020, 9:47 AM  Advanced Heart Failure Team Pager [phone] (M-F; 7a - 4p)  Please contact [facility] for night-coverage after hours (4p -7a ) and weekends on [email or web address]  Patient seen and examined with the above-signed Advanced Practice Provider and/or Housestaff. I personally reviewed laboratory data, imaging studies and relevant notes. I independently examined the patient and formulated the important aspects of the plan. I have edited the note to reflect any of my changes or salient points. I have personally discussed the plan with the patient and/or family.  Remains on milrinone. Co-ox marginal. Very weak. No CP or SOB.   General:  Weak appearing. No resp difficulty HEENT: normal Neck: supple. no JVD. Carotids 2+ bilat; no bruits. No lymphadenopathy or thryomegaly appreciated. Cor: PMI nondisplaced. Regular rate & rhythm. 3/6 MR Lungs: clear Abdomen: soft, nontender, nondistended. No hepatosplenomegaly. No bruits or masses. Good bowel sounds. Extremities: no cyanosis, clubbing, rash, edema Neuro: alert & orientedx3, cranial nerves grossly intact. moves all 4 extremities w/o difficulty. Affect pleasant  MRB meeting held today and patient not felt to be candidate for VAD therapy due to multiple factors including deconditioning, fraility and memory impairment.   I had long discussion with him and his wife about this and they would like some time to  sort through the options. We discussed home with possible Hospice and milrinone support but his wife is afraid that he is too debilitated for her to take care of him at home. SNF discussed but they are aware that he may not be able to get milrinone at SNF.   I d/w Palliative Care as well who will see him later.   Glori Bickers, MD  11:25 AM

## 2020-12-01 NOTE — Progress Notes (Signed)
This chaplain responded to Cardiac Rehab RN-Charlene referral for spiritual care.  The chaplain checked in with the Pt. RN-Teresa before phoning Father Ree Kida.  Father Ree Kida informed the chaplain he stopped by for prayer with the Pt. on Monday.  The chaplain understands Father Ree Kida will return for a visit with the Pt. on Wednesday.

## 2020-12-01 NOTE — Progress Notes (Signed)
[id number] Came to see pt to walk. Pt declined at this time . Still trying to cope with information given regarding health. Requesting to see [name]. Notified Chaplain office. Luetta Nutting RN BSN 12/01/2020 2:03 PM

## 2020-12-01 NOTE — Progress Notes (Signed)
Patient ID: [name], male   DOB: 1946-08-23, 75 y.o.   MRN: [id number]  Discussed patient's case with Dr. Gala Romney, patient was informed today that he is not a candidate for LVAD therapy.  This NP visited patient at the bedside as a follow up palliative medicine needs and emotional support, wife at bedside.  We have scheduled a goals of care meeting for tomorrow at 11:00 for continued conversation regarding transition of care, anticipatory care needs and options.  No charge   Lorinda Creed NP  Palliative Medicine Team Team Phone # [phone] Pager [phone]

## 2020-12-01 NOTE — Plan of Care (Signed)
  Problem: Clinical Measurements: Goal: Ability to maintain clinical measurements within normal limits will improve Outcome: Progressing   Problem: Clinical Measurements: Goal: Diagnostic test results will improve Outcome: Progressing   Problem: Clinical Measurements: Goal: Will remain free from infection Outcome: Progressing   

## 2020-12-01 NOTE — Progress Notes (Signed)
1008- 1030 Came to see pt to walk. Pt stated he needed a few minutes for breakfast to settle. Stated will return at 1030.  Returned at 1030 and pt in conference with Dr Gala Romney. Will continue to follow. Luetta Nutting RN BSN 12/01/2020 10:42 AM

## 2020-12-01 NOTE — Progress Notes (Signed)
Nutrition Follow-up  DOCUMENTATION CODES:   Not applicable  INTERVENTION:   -Continue MVI with minerals daily -Continue Ensure Enlive po BID, each supplement provides 350 kcal and 20 grams of protein -Continue Magic cup TID with meals, each supplement provides 290 kcal and 9 grams of protein  NUTRITION DIAGNOSIS:   Increased nutrient needs related to chronic illness (CHF) as evidenced by estimated needs.  Ongoing  GOAL:   Patient will meet greater than or equal to 90% of their needs  Progressing   MONITOR:   PO intake,Supplement acceptance,Weight trends,Skin,I & O's  REASON FOR ASSESSMENT:   Consult Assessment of nutrition requirement/status,LVAD Eval  ASSESSMENT:   [name] is a 75 y.o. male with medical history significant of CAD status post CABG and PCI, chronic combined systolic and diastolic CHF/ischemic cardiomyopathy with LVEF 20-25% and history of VT status post ICD, hypertension, hyperlipidemia, CKD stage IIIa, history of prostate and tonsillar cancer, history of DVT presenting to the ED with complaints of shortness of breath.  Patient reports dyspnea on exertion and cough for the past 3 to 4 days.  He was having low-grade fevers earlier but has not had them for the past few days.  He has also had 3 episodes of vomiting over the past week which has now resolved.  Denies abdominal pain or diarrhea.  States his ICD had shocked him prior to his recent hospitalization but he has not had any issues lately.  He is vaccinated against Covid.  States he has been checked for Covid multiple times recently and every time results came back negative.  12/27- s/p rt heart cath  Reviewed I/O's: +658 ml x 24 hours and -1.2 L since admission  UOP: 1 L x 24 hours  Per Advanced Heart Failure team, pt is not a VAD candidate secondary to memory impairment and deconditioning. Plan to deactivate ICD today. Palliative care consult pending to discuss goals of care.   Pt  unavailable at time of attempted visit.   Pt remains with variable intake. Noted meal completion 15-100%. Pt has been consuming Ensure Enlive supplements.   Medications reviewed and include milrinone.   Labs reviewed.   Diet Order:   Diet Order            Diet 2 gram sodium Room service appropriate? Yes; Fluid consistency: Thin; Fluid restriction: 1500 mL Fluid  Diet effective now                 EDUCATION NEEDS:   No education needs have been identified at this time  Skin:  Skin Assessment: Reviewed RN Assessment  Last BM:  11/30/20  Height:   Ht Readings from Last 1 Encounters:  11/17/20 6' (1.829 m)    Weight:   Wt Readings from Last 1 Encounters:  12/01/20 77.4 kg    Ideal Body Weight:  80.9 kg  BMI:  Body mass index is 23.14 kg/m.  Estimated Nutritional Needs:   Kcal:  2400-2600  Protein:  140-160 grams  Fluid:  1.5 L    [name], RD, LDN, CDCES Registered Dietitian II Certified Diabetes Care and Education Specialist Please refer to [facility] for RD and/or RD on-call/weekend/after hours pager

## 2020-12-01 NOTE — Progress Notes (Signed)
Inpatient Rehab Admissions Coordinator:   Briefly met with pt. And family to discuss CIR admit. Daughter indicated they have decided against LVAD surgery and are considering palliative care tomorrow. She would like to hold off on conversations regarding rehab/dc until after that conversation.  Clemens Catholic, [place], [place] Admissions Coordinator  [phone] ([place]) [phone] (office)

## 2020-12-01 NOTE — Progress Notes (Signed)
PROGRESS NOTE  [name] MLY:[id number] DOB: [birth year] DOA: 11/17/2020 PCP: Myrlene Broker, MD  Brief History   Peter Night [name] a 75 y.o.malewith medical history significant ofCAD status post CABG and PCI, chronic combined systolic and diastolic CHF/ischemic cardiomyopathy with LVEF 20-25% and history of VT status post ICD, hypertension, hyperlipidemia,CKD stage IIIa,history of prostate and tonsillar cancer, history of DVT presenting to the ED with complaints of shortness of breath.Patient reports dyspnea on exertion and cough for the past 3 to 4 days. He was having low-grade fevers earlier but has not had them for the past few days. He has also had 3 episodes of vomiting over the past week which has now resolved. Denies abdominal pain or diarrhea. States his ICD had shocked him prior to his recent hospitalization but he has not had any issues lately. He is vaccinated against Covid.   ED Course:Afebrile.Tachypneic with respiratory rate in the 20s.Not tachycardic.Not hypoxic. WBC 7.0, hemoglobin 10.3, hematocrit 33.9, platelet 186K.Sodium 140, potassium 3.7, chloride 107, bicarb 22, BUN 27, creatinine 1.7, glucose 106. High-sensitivity troponin mildly elevated but stable (29 >29).BNP significantly elevated at 3663. SARS-CoV-2 PCR test and influenza panel both negative.  Chest x-ray showing cardiomegaly and small effusions in the posterior costophrenic angles; no frank edema.Patient was given IV Lasix 20 mg.  Vascular surgery was consulted due to the patient's carotid artery stenosis, Left ICA stenosis >80%. The patient was evaluated by Dr. [name] on 11/24/2020. He determined that if the patient were to meet criteria for LVAD placement, it would make sense to perform CEA during the same procedure. However, if the patient were not found to be a candidate for the LVAD procedure, then he would not be seen as a candidate for CEA.  Unfortunately due to the  patient's memory impairment/ poor functional status, and safety issues he is deemed not appropriate for LVAD. Cardiology has recommended that the patient be discharged to SNF on milrinone for palliation.   Palliative care has been consulted to assist the patient and his wife in making decisions about the next phase of his care.  Consultants  . Cardiology . Vascular Surgery . LVAD Team. . Heart Failure Team . Palliative care  Procedures  . None  Antibiotics   Anti-infectives (From admission, onward)   None     Subjective  The patient is resting quietly in bed. He is trying to come terms with the decision on LVAD.  Objective   Vitals:  Vitals:   12/01/20 1127 12/01/20 1619  BP: 135/77 123/71  Pulse: 84 76  Resp: 20 19  Temp: 98.2 F (36.8 C) 98.9 F (37.2 C)  SpO2: 97% 98%    Exam:  Constitutional:  . The patient is awake, alert, and oriented x 3. Depressed. Respiratory:  . No increased work of breathing. . No wheezes, rales, or rhonchi . No tactile fremitus Cardiovascular:  . Regular rate and rhythm . No murmurs, ectopy, or gallups. . No lateral PMI. No thrills. Abdomen:  . Abdomen is soft, non-tender, non-distended . No hernias, masses, or organomegaly . Normoactive bowel sounds.  Musculoskeletal:  . No cyanosis, clubbing, or edema Skin:  . No rashes, lesions, ulcers . palpation of skin: no induration or nodules Neurologic:  . CN 2-12 intact . Sensation all 4 extremities intact Psychiatric:  . Mental status o Mood, affect congruent o Orientation to person, place, time  . judgment and insight appear intact  I have personally reviewed the following:   Today's Data  .  Vitals, BMP, CBC, methemeglobin, O2 saturation.  Imaging  . CT head without contrast . Vascular ultrasound . CT Chest without contrast  Cardiology Data  . EKG . Echocardiogram (11/22/2020)  Scheduled Meds: . amiodarone  200 mg Oral BID  . aspirin EC  81 mg Oral Daily  .  atorvastatin  80 mg Oral QPM  . buPROPion  300 mg Oral Daily  . Chlorhexidine Gluconate Cloth  6 each Topical Daily  . escitalopram  20 mg Oral Daily  . feeding supplement  237 mL Oral BID BM  . mexiletine  150 mg Oral Q8H  . multivitamin with minerals  1 tablet Oral Daily  . pantoprazole  40 mg Oral QAC breakfast  . sodium chloride flush  10-40 mL Intracatheter Q12H  . sodium chloride flush  3 mL Intravenous Q12H  . zolpidem  5 mg Oral QHS   Continuous Infusions: . heparin 1,250 Units/hr (12/01/20 1330)  . milrinone 0.375 mcg/kg/min (12/01/20 1322)    Principal Problem:   VT (ventricular tachycardia) (HCC) Active Problems:   Hyperlipidemia with target LDL less than 70   Carotid artery disease (HCC)   Acute on chronic combined systolic and diastolic CHF (congestive heart failure) (HCC)   CHF exacerbation (HCC)   Acute on chronic kidney failure (HCC)   LOS: 14 days   A & P  Acute on chronic combined systolic and diastolic CHF: Patient presented with signs of fluid overload.  Chest x-ray shows cardiomegaly and small effusion in the posterior costophrenic angles. On admission the patient was found to have bibasilar rales, JVD and significantly elevated BNP of 3663. This was despite the use of milrinone drip at home. Echo from 10/24/2020 showing severely reduced ejection fraction of 20 to 123456, grade 2 diastolic dysfunction, moderate to severe MR. RHC on 11/23/2020 demonstrated mild pulmonary venous hypertension, improved volume status, and acceptable cardiac output on milrinone. The pulmonary artery pulsatility index was also found to be acceptable at 4.8. (Pulmonary artery pressure/right atrial pressure). Cardiology discontinued Lasix, Entresto, and Coreg. The patient has been continued on milrinone. Volume will be carefully monitored along with electrolytes and creatinine. Magnesium will be kept greater than 2 and potassium greater than 4. The patient is being evaluated by the VAD team,  CTS and vascular surgery. Consideration is being given to CIR, but it is not clear whether this will be pursued before or after VAD. Unfortunately due to the patient's memory impairment/ poor functional status, and safety issues he is deemed not appropriate for LVAD. Cardiology has recommended that the patient be discharged to SNF on milrinone for palliation. Palliative care has been consulted to assist the patient and his wife in making decisions about the next phase of his care.  Monomorphic ventricular tachycardia: The patient was given loading dose of amiodarone and then was placed on a drip. He has been continued on amiodarone 400 mg twice daily and mexiletine, although today that has been reduced to 400 mg amiodarone daily and mexiletine 150 mg bid. The patient underwent recent left heart cath on 10/23/2020 without any targets for revascularization. The assistance of cardiology and EP is appreciated.  LV thrombus: Noted on echo on 12/26. Shows 20% ejection fraction with LV thrombus.  Severe MR.  Moderate RV dysfunction. Continue IV heparin as per pharmacy. He has not been converted to coumadin due to possible VAD.  Carotid Stenosis: Doppler demonstrated >80% stenosis of the left ICA. Vascular surgery was consulted. The patient was evaluated by Dr. Donzetta Matters on 11/24/2020.  He determined that if the patient were to meet criteria for LVAD placement, it would make sense to perform CEA during the same procedure. However, if the patient were not found to be a candidate for the LVAD procedure, then he would not be seen as a candidate for CEA.  AKI on CKD stage IIIa: Kidney function improving. Creatinine today is down to 1.44. Baseline creatinine is 1.1. Entresto and Lasix was discontinued by cardiology. Continue to monitor creatinine, electrolytes, and volume status. Avoid nephrotoxic substances and hypotension.  Severe MR: Status post RHC on 12/27. Appreciate cardiology assistance.  History of coronary  artery disease status post CABG and PCI:  Patient denies ACS symptoms.  Troponin mildly elevated but stable 29x2.  Suspected demand ischemia from decompensated CHF. Continue aspirin, and statin  Hypokalemia: Resolved. Monitor.  Hypomagnesemia: Resolved  Hypertension: Stable.  Continue Coreg and Entresto. Monitor blood pressure closely.  Hyperlipidemia: Continue Lipitor  GERD: Continue PPI  Depression/anxiety: Continue Lexapro, Wellbutrin and Ambien  Hypoalbuminemia: Prealbumin: 13. Dietitian consulted.  I have seen and examined this patient myself. I have spent 35 minutes in his evaluation and care.  DVT prophylaxis: Heparin Code Status: None Family Communication: None available. Disposition Plan: Home with hospice vs SNF with hospice..  Status is: Inpatient  Dispo: The patient is from: Home  Anticipated d/c is to: TBD  Anticipated d/c date is: TBD  Patient currently is not medically stable to d/c due to lack of safe discharge and plan.  Peter Dambrosia, DO [facility] Direct contact: see [email or web address]  7PM-7AM contact night coverage as above 12/01/2020, 5:16 PM  LOS: 8 days

## 2020-12-01 NOTE — Progress Notes (Signed)
This chaplain followed up with the Pt. after making the arrangements for Father Ree Kida to visit on Wednesday. The healthcare team is bedside.  The chaplain listened as Rickie tearfully shared the Pt. is no longer a LVAD candidate.  The chaplain will update the PMT to revisit per the Pt. And Rickie's request.    The chaplain offered prayer and F/U spiritual care to the family as needed.

## 2020-12-01 NOTE — Progress Notes (Signed)
ANTICOAGULATION CONSULT NOTE  Pharmacy Consult for heparin Indication: LV thrombus 12/26   Allergies  Allergen Reactions  . Other Anaphylaxis    Insect bites (Has EpiPen)    Patient Measurements: Height: 6' (182.9 cm) Weight: 77.4 kg (170 lb 9.6 oz) IBW/kg (Calculated) : 77.6 Heparin Dosing Weight: 82kg  Vital Signs: Temp: 98.2 F (36.8 C) (01/04 1127) Temp Source: Oral (01/04 1127) BP: 135/77 (01/04 1127) Pulse Rate: 84 (01/04 1127)  Labs: Recent Labs    11/29/20 0317 11/30/20 0523 12/01/20 0535  HGB 9.7* 9.1* 8.9*  HCT 30.6* 30.2* 29.9*  PLT 253 235 232  HEPARINUNFRC 0.41 0.37 0.40  CREATININE 1.43* 1.37* 1.31*    Estimated Creatinine Clearance: 55 mL/min (A) (by C-G formula based on SCr of 1.31 mg/dL (H)).   Medical History: Past Medical History:  Diagnosis Date  . Adult ADHD   . AICD (automatic cardioverter/defibrillator) present   . CAD (coronary artery disease)    Cath 2008, all grafts patent // Cath 2017 - all grafts patent   . 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 Carotid artery disease (HCC)    Doppler, February, 2012, mild plaque, 0-39% bilateral, plan followup in one year  . Dyspepsia   . Ejection fraction < 50%   . Elevated PSA 02/15/2011  . Esophageal stricture   . GERD (gastroesophageal reflux disease)   . HTN (hypertension)   . Hx of CABG    1991  . Hyperlipidemia   . 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 Malignant neoplasm of tonsil (HCC)   . Myocardial infarction (HCC) 1998  . Prostate cancer (HCC) 2012   IMRT, ADT  . Thrombosis of arm    Left  . Venous thrombosis    Left arm      Assessment: 75 yo M with large laminated mural apical thrombus found on TTE 11/22/20, originally admitted for HF exacerbation. Pharmacy consulted for IV heparin. No AC PTA.   Heparin  level continues to be therapeutic at 0.4 on 1250 units/hr. Hgb stable 9's, PLTs WNL. No s/sx of bleeding or infusion issues.   Goal of Therapy:  Heparin level 0.3-0.7 units/ml Monitor platelets by anticoagulation protocol: Yes   Plan:  Continue heparin infusion 1250 units/hr Await final plans for DC and then change to oral anticoagulation Monitor for signs/symptoms of bleeding     Leota Sauers Pharm.D. CPP, BCPS Clinical Pharmacist [phone] 12/01/2020 11:27 AM    Please check [email or web address] for unit specific pharmacy phone numbers.

## 2020-12-02 ENCOUNTER — Encounter (HOSPITAL_COMMUNITY)
Admission: EM | Disposition: A | Discharge status: Hospice | Payer: Self-pay | Source: Home / Self Care | Attending: Internal Medicine

## 2020-12-02 ENCOUNTER — Inpatient Hospital Stay (HOSPITAL_COMMUNITY): Payer: Medicare Other

## 2020-12-02 DIAGNOSIS — I472 Ventricular tachycardia: Secondary | ICD-10-CM | POA: Diagnosis not present

## 2020-12-02 LAB — COOXEMETRY PANEL
Carboxyhemoglobin: 1.5 % (ref 0.5–1.5)
Methemoglobin: 0.6 % (ref 0.0–1.5)
O2 Saturation: 60 %
Total hemoglobin: 9.1 g/dL — ABNORMAL LOW (ref 12.0–16.0)

## 2020-12-02 LAB — BASIC METABOLIC PANEL
Anion gap: 10 (ref 5–15)
BUN: 13 mg/dL (ref 8–23)
CO2: 24 mmol/L (ref 22–32)
Calcium: 8.9 mg/dL (ref 8.9–10.3)
Chloride: 104 mmol/L (ref 98–111)
Creatinine, Ser: 1.35 mg/dL — ABNORMAL HIGH (ref 0.61–1.24)
GFR, Estimated: 55 mL/min — ABNORMAL LOW (ref >60)
Glucose, Bld: 104 mg/dL — ABNORMAL HIGH (ref 70–99)
Potassium: 4 mmol/L (ref 3.5–5.1)
Sodium: 138 mmol/L (ref 135–145)

## 2020-12-02 LAB — CBC
HCT: 31.7 % — ABNORMAL LOW (ref 39.0–52.0)
Hemoglobin: 9.5 g/dL — ABNORMAL LOW (ref 13.0–17.0)
MCH: 25.7 pg — ABNORMAL LOW (ref 26.0–34.0)
MCHC: 30 g/dL (ref 30.0–36.0)
MCV: 85.9 fL (ref 80.0–100.0)
Platelets: 252 10*3/uL (ref 150–400)
RBC: 3.69 MIL/uL — ABNORMAL LOW (ref 4.22–5.81)
RDW: 15.7 % — ABNORMAL HIGH (ref 11.5–15.5)
WBC: 7.7 10*3/uL (ref 4.0–10.5)
nRBC: 0 % (ref 0.0–0.2)

## 2020-12-02 LAB — HEPARIN LEVEL (UNFRACTIONATED): Heparin Unfractionated: 0.43 IU/mL (ref 0.30–0.70)

## 2020-12-02 SURGERY — REDO STERNOTOMY
Anesthesia: General | Site: Chest

## 2020-12-02 MED ORDER — MORPHINE SULFATE (PF) 2 MG/ML IV SOLN: 1.0000 mg | INTRAVENOUS | Status: DC | PRN

## 2020-12-02 MED ORDER — FUROSEMIDE 10 MG/ML IJ SOLN
40.0000 mg | Freq: Once | INTRAMUSCULAR | Status: AC
  Administered 2020-12-02: 40 mg via INTRAVENOUS
  Filled 2020-12-02: qty 4

## 2020-12-02 MED ORDER — LORAZEPAM 2 MG/ML IJ SOLN: 1.0000 mg | INTRAMUSCULAR | Status: DC | PRN

## 2020-12-02 MED ORDER — FUROSEMIDE 10 MG/ML IJ SOLN: 40.0000 mg | Freq: Once | INTRAMUSCULAR | Status: DC

## 2020-12-02 NOTE — Progress Notes (Signed)
ANTICOAGULATION CONSULT NOTE  Pharmacy Consult for heparin Indication: LV thrombus 12/26   Allergies  Allergen Reactions  . Other Anaphylaxis    Insect bites (Has EpiPen)    Patient Measurements: Height: 6' (182.9 cm) Weight: 77.6 kg (171 lb 1.2 oz) IBW/kg (Calculated) : 77.6 Heparin Dosing Weight: 82kg  Vital Signs: Temp: 98.5 F (36.9 C) (01/05 1149) Temp Source: Oral (01/05 1149) BP: 122/70 (01/05 1149) Pulse Rate: 76 (01/05 1149)  Labs: Recent Labs    11/30/20 0523 12/01/20 0535 12/02/20 0226  HGB 9.1* 8.9* 9.5*  HCT 30.2* 29.9* 31.7*  PLT 235 232 252  HEPARINUNFRC 0.37 0.40 0.43  CREATININE 1.37* 1.31* 1.35*    Estimated Creatinine Clearance: 53.5 mL/min (A) (by C-G formula based on SCr of 1.35 mg/dL (H)).   Medical History: Past Medical History:  Diagnosis Date  . Adult ADHD   . AICD (automatic cardioverter/defibrillator) present   . CAD (coronary artery disease)    Cath 2008, all grafts patent // Cath 2017 - all grafts patent   . 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 Carotid artery disease (HCC)    Doppler, February, 2012, mild plaque, 0-39% bilateral, plan followup in one year  . Dyspepsia   . Ejection fraction < 50%   . Elevated PSA 02/15/2011  . Esophageal stricture   . GERD (gastroesophageal reflux disease)   . HTN (hypertension)   . Hx of CABG    1991  . Hyperlipidemia   . 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 Malignant neoplasm of tonsil (HCC)   . Myocardial infarction (HCC) 1998  . Prostate cancer (HCC) 2012   IMRT, ADT  . Thrombosis of arm    Left  . Venous thrombosis    Left arm      Assessment: 75 yo M with large laminated mural apical thrombus found on TTE 11/22/20, originally admitted for HF exacerbation. Pharmacy consulted for IV heparin. No AC PTA.   Heparin  level continues to be therapeutic at 0.4 on 1250 units/hr. Hgb stable 9's, PLTs WNL. No s/sx of bleeding or infusion issues.  Awaiting direction from palliative care for plans  Goal of Therapy:  Heparin level 0.3-0.7 units/ml Monitor platelets by anticoagulation protocol: Yes   Plan:  Continue heparin infusion 1250 units/hr Await final plans for DC  Monitor for signs/symptoms of bleeding     Leota Sauers Pharm.D. CPP, BCPS Clinical Pharmacist [phone] 12/02/2020 2:47 PM    Please check [email or web address] for unit specific pharmacy phone numbers.

## 2020-12-02 NOTE — Progress Notes (Addendum)
Patient ID: [name], male   DOB: 08-Sep-1946, 75 y.o.   MRN: [id number]    Advanced Heart Failure Rounding Note   Subjective:     Remains on milrinone 0.375 mcg. CO-OX 60% . Turned down for VAD.   Feeling ok today. No shortness of breath.      Objective:   Weight Range:  Vital Signs:   Temp:  [98.2 F (36.8 C)-98.9 F (37.2 C)] 98.7 F (37.1 C) (01/05 0504) Pulse Rate:  [72-84] 75 (01/05 0034) Resp:  [18-20] 20 (01/05 0504) BP: (111-135)/(62-77) 116/62 (01/05 0504) SpO2:  [95 %-98 %] 95 % (01/05 0504) Weight:  [77.6 kg] 77.6 kg (01/05 0034) Last BM Date: 11/30/20  Weight change: Filed Weights   11/30/20 0448 12/01/20 0500 12/02/20 0034  Weight: 79.3 kg 77.4 kg 77.6 kg    Intake/Output:   Intake/Output Summary (Last 24 hours) at 12/02/2020 0918 Last data filed at 12/02/2020 5625 Gross per 24 hour  Intake 605 ml  Output 1275 ml  Net -670 ml    CVP 5  Physical Exam: General:  No resp difficulty HEENT: normal Neck: supple. JVP 5-6 . Carotids 2+ bilat; no bruits. No lymphadenopathy or thryomegaly appreciated. Cor: PMI nondisplaced. Regular rate & rhythm. No rubs, gallops or murmurs. Lungs: clear Abdomen: soft, nontender, nondistended. No hepatosplenomegaly. No bruits or masses. Good bowel sounds. Extremities: no cyanosis, clubbing, rash, edema. RUE PICC Neuro: alert & orientedx3, cranial nerves grossly intact. moves all 4 extremities w/o difficulty. Affect flat   Telemetry:  SR 80s  Labs: Basic Metabolic Panel: Recent Labs  Lab 11/28/20 0201 11/29/20 0317 11/30/20 0523 12/01/20 0535 12/02/20 0226  NA 140 140 139 137 138  K 4.0 4.3 3.6 3.8 4.0  CL 109 106 104 105 104  CO2 21* _0 GLUCOSE 99 102* 97 101* 104*  BUN _1 CREATININE 1.39* 1.43* 1.37* 1.31* 1.35*  CALCIUM 8.6* 8.8* 8.7* 8.7* 8.9    Liver Function Tests: No results for input(s): AST, ALT, ALKPHOS, BILITOT, PROT, ALBUMIN in the last 168 hours. No results for  input(s): LIPASE, AMYLASE in the last 168 hours. No results for input(s): AMMONIA in the last 168 hours.  CBC: Recent Labs  Lab 11/28/20 0201 11/29/20 0317 11/30/20 0523 12/01/20 0535 12/02/20 0226  WBC 6.3 6.2 6.0 6.7 7.7  HGB 9.4* 9.7* 9.1* 8.9* 9.5*  HCT 29.3* 30.6* 30.2* 29.9* 31.7*  MCV 83.5 84.5 85.6 85.7 85.9  PLT 228 253 235 232 252    Cardiac Enzymes: No results for input(s): CKTOTAL, CKMB, CKMBINDEX, TROPONINI in the last 168 hours.  BNP: BNP (last 3 results) Recent Labs    11/17/20 1645  BNP 3,663.4*    ProBNP (last 3 results) No results for input(s): PROBNP in the last 8760 hours.    Other results:  Imaging: No results found.   Medications:     Scheduled Medications: . amiodarone  200 mg Oral BID  . aspirin EC  81 mg Oral Daily  . atorvastatin  80 mg Oral QPM  . buPROPion  300 mg Oral Daily  . Chlorhexidine Gluconate Cloth  6 each Topical Daily  . escitalopram  20 mg Oral Daily  . feeding supplement  237 mL Oral BID BM  . mexiletine  150 mg Oral Q8H  . multivitamin with minerals  1 tablet Oral Daily  . pantoprazole  40 mg Oral QAC breakfast  . sodium chloride flush  10-40 mL Intracatheter  Q12H  . sodium chloride flush  3 mL Intravenous Q12H  . zolpidem  5 mg Oral QHS    Infusions: . heparin 1,250 Units/hr (12/02/20 0821)  . 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 Severe MR. Moderate RV dysfunction. Now on heparin. No bleeding - NYHA IV.  Low output physiology confirmed Co-ox (12/25) 44%. - RHC 11/24/20 on RA 6, PCWP 19, CO 6.2/3.0  - Milrinone started on 12/25. Cath was on milrinone 0.25  mcg with stable hemodynamcis on cath.  - Continue milrinone to 0.375. CO-OX 60%  -  Hold off on spiro.  -  No bb with shock - Off Entresto with worsening renal function.  - RV has moderate dysfunction but CVP low. - Working up for LVAD. 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  - MRB discussed and he is not a candidate for VAD - Consult TOC for HH. Change to single lumen PICC if he wants to continue milrinone.   2.Monomorphic ventricular tachycardia -No further recurrence. ICD VT zones have been adjusted. Continue amiodarone.  - Keep K> 4.0 Mg > 2.0 - Recent left heart catheterization on 10/23/2020 without any targets for revascularization - No VT  - Continue amiodarone 200 mg daily and continue  + mexiletine 150 mg twice a day.    3.CAD status post CABG -Recent left heart cath with severe three-vessel CAD. -He has patent vein grafts. (LIMA -> LAD, SVG-> RCA, SVG -> OM2 -> OM3) - No chest pain.   -Continue aspirin and atorvastatin.   4.Severe mitral regurgitation - pending VAD  5. AKI  - due to cardiorenal/ATN - baseline 1.1 - Plateued at 1.3-1.4 .  - Creatinine 1.35   6. Hypokalemia - K stable  7. Apical LV mural thrombus - heparin started. Holding on coumadin due to possible VAD  8. Hypoalbuminemia.  Prealbumin 13 .  Consult dietitian   9. Carotid Disease - He had >16% LICA stenosis on pre-surgical carotid dopplers. VVShas seen, if gets LVAD will need to fix concomitantly. - On atorvastatin 80 mg dialy.   10. Deconditioning PT following-- Concerned about his safety.   11. Memory Issues  CT head with mild small vessel disease and cerebral atrophy.   His wife says she cant take him home. Suspect he will need SNF which will be difficult with milrinone.   Not a VAD candidate with memory impairment and deconditioning/mobility safety issues. Discussed with him. Refer to [facility] for [facility] with milrinone. We discussed that  milrinone is for palliative purposes.    Medtronic to deactivated I CD.   Palliative Care has met with him and he is a DNR. Follow up family meeting today.   Length of Stay: [place]  NP-C  12/02/2020, 9:18 AM  Advanced Heart Failure Team Pager [phone] (M-F; 7a - 4p)  Please contact [facility] for night-coverage after hours (4p -7a ) and weekends on [email or web address]  Palliative Care appreciated. Discussed with Wadie Lessen NP family wishes. Plan to stop milrinone today and see how he is doing tomorrow. I placed order to stop milrinone. Possible d/c to home with hospice versus residential hospice.   Amy Clegg  NP-C  12:39 PM   Patient seen and examined with the above-signed Advanced Practice Provider and/or Housestaff. I personally reviewed laboratory data, imaging studies and relevant notes. I independently examined the patient and formulated the important aspects of the plan. I have edited the note to reflect any of my changes or salient points. I have personally discussed the plan with the patient and/or family.  He has met with Palliative Care and decided on full comfort approach. Milrinone now off. Feels ok but notices increasing cough and also have some sweats. ICD has been deactivated.   General:  Weak appearing. No resp difficulty. Mild diaphoretic HEENT: normal Neck: supple. JVP 6-7. Carotids 2+ bilat; no bruits. No lymphadenopathy or thryomegaly appreciated. Cor: PMI nondisplaced. Regular rate & rhythm.+ s3 3/6 MR. Lungs: Crackles left base Abdomen: soft, nontender, nondistended. No hepatosplenomegaly. No bruits or masses. Good bowel sounds. Extremities: no cyanosis, clubbing, rash, edema Neuro: alert & orientedx3, cranial nerves grossly intact. moves all 4 extremities w/o difficulty. Affect pleasant  He has now transitioned to comfort care. Likely does not qualify for [facility] at this point but milrinone effects tend to linger for about 48 hours so I suspect he may  get worse over the next day or two in which case he would qualify for [facility]. Otherwise will likely need to consider home with Hospice support. We will watch and see. Will give one daose lasix todaya nd check CXR given increased SOB and diaphoresis.   Glori Bickers, MD  2:36 PM

## 2020-12-02 NOTE — Progress Notes (Addendum)
Physical Therapy Treatment Patient Details Name: [name] MRN: [id number] DOB: 05-10-1946 Today's Date: 12/02/2020    History of Present Illness 75 y.o. 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 EF 20% with LV thrombus, severe MR, moderate RV dysfunction. Being worked up for LVAD.    PT Comments    Went to check in on pt and pt stating he would like to try to walk. Pt requiring min assist for transfers, ambulating 80 ft x 80 ft with a walker at a moderate assist level; required a seated rest break in between bouts. HR stable in 80's, BP 135/74 post.   Unfortunately, upon return to room, pt became very fatigued and began to pull walker at increased length ahead of him and step outside of it despite max multimodal cueing. He increased his cadence with uncontrolled momentum. When facing bed and turning towards right, pt scissoring feet and reaching outside of walker for bed, with overt loss of balance anteriorly. PT assisted onto floor with gait belt. RN notified who subsequently came and assisted PT with transfer back to bed. Pt denying pain; BLE ROM full and intact upon assessment.     Follow Up Recommendations  SNF     Equipment Recommendations  3in1 (PT)    Recommendations for Other Services       Precautions / Restrictions Precautions Precautions: Fall Restrictions Weight Bearing Restrictions: No    Mobility  Bed Mobility Overal bed mobility: Needs Assistance Bed Mobility: Supine to Sit     Supine to sit: Min assist     General bed mobility comments: MinA to pull trunk up to sitting position  Transfers Overall transfer level: Needs assistance Equipment used:  Rolling walker (2 wheeled) Transfers: Sit to/from Stand Sit to Stand: Min assist         General transfer comment: MinA to power up and initially stabilize  Ambulation/Gait Ambulation/Gait assistance: Mod assist Gait Distance (Feet): 80 Feet (80 x 2) Assistive device: Rolling walker (2 wheeled) Gait Pattern/deviations: Step-through pattern;Narrow base of support;Scissoring;Shuffle;Decreased dorsiflexion - right;Decreased dorsiflexion - left     General Gait Details: Pt with shuffling gait pattern, increased cadence, decreased bilateral foot clearance, and increased trunk flexion. Cues provided for slowing pace and walker proximity, but pt having difficulty performing. Pt with poor self regulation of activity level, requiring one seated rest break.   Stairs             Wheelchair Mobility    Modified Rankin (Stroke Patients Only)       Balance Overall balance assessment: Needs assistance Sitting-balance support: Feet supported;No upper extremity supported Sitting balance-Leahy Scale: Fair     Standing balance support: During functional activity;Bilateral upper extremity supported Standing balance-Leahy Scale: Poor Standing balance comment: reliant on RW and external support                            Cognition Arousal/Alertness: Awake/alert Behavior During Therapy: Flat affect Overall Cognitive Status: Impaired/Different from baseline Area of Impairment: Memory;Safety/judgement;Awareness;Problem solving;Following commands                     Memory: Decreased short-term memory Following Commands: Follows multi-step  commands inconsistently;Follows one step commands with increased time Safety/Judgement: Decreased awareness of deficits;Decreased awareness of safety Awareness: Intellectual Problem Solving: Requires verbal cues General Comments: Poor awareness of safety/deficits. Poor STM. Not able to self monitor activity well. High fall risk.       Exercises      General Comments        Pertinent Vitals/Pain Pain Assessment: No/denies pain    Home Living                      Prior Function            PT Goals (current goals can now be found in the care plan section) Acute Rehab PT Goals Patient Stated Goal: did not state Potential to Achieve Goals: Fair Progress towards PT goals: Not progressing toward goals - comment    Frequency    Min 2X/week      PT Plan Discharge plan needs to be updated;Frequency needs to be updated    Co-evaluation              AM-PAC PT "6 Clicks" Mobility   Outcome Measure  Help needed turning from your back to your side while in a flat bed without using bedrails?: None Help needed moving from lying on your back to sitting on the side of a flat bed without using bedrails?: None Help needed moving to and from a bed to a chair (including a wheelchair)?: A Little Help needed standing up from a chair using your arms (e.g., wheelchair or bedside chair)?: A Little Help needed to walk in hospital room?: A Lot Help needed climbing 3-5 steps with a railing? : A Lot 6 Click Score: 18    End of Session Equipment Utilized During Treatment: Gait belt Activity Tolerance: Patient limited by fatigue Patient left: with call bell/phone within reach;in bed;with bed alarm set   PT Visit Diagnosis: Unsteadiness on feet (R26.81);Other abnormalities of gait and mobility (R26.89);Muscle weakness (generalized) (M62.81);Difficulty in walking, not elsewhere classified (R26.2);History of falling (Z91.81)     Time: 3790-2409 PT Time Calculation (min) (ACUTE ONLY): 22 min  Charges:  $Gait Training: 8-22 mins                     Lillia Pauls, PT, DPT Acute Rehabilitation Services Pager [phone] Office [phone]    Norval Morton 12/02/2020, 5:05 PM

## 2020-12-02 NOTE — Progress Notes (Signed)
   12/02/20 1628  What Happened  Was fall witnessed? Yes (by physical therapist)  Who witnessed fall? physical therapist  Patients activity before fall ambulating-assisted  Point of contact hip/leg (right knee)  Was patient injured? Unsure  Follow Up  MD notified swayze  Time MD notified 0430  Family notified Yes - comment  Time family notified 1640  Progress note created (see row info) Yes  Adult Fall Risk Assessment  Risk Factor Category (scoring not indicated) High fall risk per protocol (document High fall risk)  Patient Fall Risk Level High fall risk  Adult Fall Risk Interventions  Required Bundle Interventions *See Row Information* High fall risk - low, moderate, and high requirements implemented  Additional Interventions Use of appropriate toileting equipment (bedpan, BSC, etc.);PT/OT need assessed if change in mobility from baseline  Screening for Fall Injury Risk (To be completed on HIGH fall risk patients) - Assessing Need for Low Bed  Risk For Fall Injury- Low Bed Criteria None identified - Continue screening  Will Implement Low Bed and Floor Mats No - Criteria no longer met for low bed  Screening for Fall Injury Risk (To be completed on HIGH fall risk patients who do not meet crieteria for Low Bed) - Assessing Need for Floor Mats Only  Risk For Fall Injury- Criteria for Floor Mats None identified - No additional interventions needed  Vitals  BP 135/74  MAP (mmHg) 91  BP Location Left Arm  BP Method Automatic  Patient Position (if appropriate) Sitting  Pulse Rate 73  Pulse Rate Source Monitor  Oxygen Therapy  SpO2 98 %  O2 Device Room Air  Pain Assessment  Pain Scale 0-10  Pain Score 0  PCA/Epidural/Spinal Assessment  Respiratory Pattern Regular;Unlabored  Neurological  Neuro (WDL) WDL  Level of Consciousness Alert  Orientation Level Oriented X4  Cognition Follows commands  Speech Clear  Pupil Assessment  No  Neuro Symptoms Tremors (pt had previous tremors  in hand)  Musculoskeletal  Musculoskeletal (WDL) X  Assistive Device BSC;Front wheel walker  Generalized Weakness Yes  Weight Bearing Restrictions No  Integumentary  Integumentary (WDL) X  RN Assisting with Skin Assessment on Admission emily  Skin Color Appropriate for ethnicity  Skin Condition Dry  Skin Integrity Abrasion  Abrasion Location Leg  Abrasion Location Orientation Right;Upper  Abrasion Intervention Other (Comment) (assessed)  Ecchymosis Location Arm;Abdomen  Ecchymosis Location Orientation Bilateral  Ecchymosis Intervention Other (Comment)  Skin Turgor Non-tenting

## 2020-12-02 NOTE — Progress Notes (Signed)
Patient ID: [name], male   DOB: [birth year], 75 y.o.   MRN: [id number]    Palliative Medicine Inpatient Follow Up Note   HPI: 75 y.o. 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 EF 20% with LV thrombus, severe MR, moderate RV dysfunction. Began milrinone infusion.  Today is day 14 of this hospital stay and unfortunately patient was informed yesterday that he I snot felt to be a candidate for LVAD therapy  Patient and his family face treatment option decisions, advanced directive decisions and anticipatory care needs.   Today's Discussion:  Created space and opportunity for family to explore their thought and feelings regarding current medical situation.    Both verbalize an understanding of the seriousness of the patient's medical condition, and the associated poor prognosis.  Patient and his wife tell me that they've had many conversations in the past and they both verbalized that quality of life is a priority and they would not desire aggressive life prolonging measures.  Education offered on the difference between aggressive medical intervention path and a palliative comfort path for this patient at this time in this situation.  Education offered regarding the natural trajectory and expectations of end-stage heart disease and end-of-life without life prolonging medical interventions (Milrinone, heparin, monitoring, lab correction), solely  focusing on comfort and dignity.  Wife verbalizes main concern/and worry is her ability to care for him at home  Education offered on hospice benefit, services both in the home and residential.  This is a very hard  situation for the patient and his wife.  He verbalizes that he has found comfort and peace since speaking with his priest yesterday.     --"Hard Choices for Loving People" -left for review   Questions and concerns addressed   Follow-up meeting scheduled for tomorrow at noon  Subjective Assessment:  Vital Signs Vitals:   12/02/20 0034 12/02/20 0504  BP: 127/72 116/62  Pulse: 75   Resp: 18 20  Temp: 98.7 F (37.1 C) 98.7 F (37.1 C)  SpO2: 95% 95%    Intake/Output Summary (Last 24 hours) at 12/02/2020 1005 Last data filed at 12/02/2020 8938 Gross per 24 hour  Intake 605 ml  Output 1275 ml  Net -670 ml   Last Weight  Most recent update: 12/02/2020 12:37 AM   Weight  77.6 kg (171 lb 1.2 oz)            Gen:  Dyspneic at rest HEENT: moist mucous membranes CV: Regular rate and rhythm, no murmurs rubs or gallops PULM: decreased BS in bases ABD: soft/nontender/nondistended EXT: No edema Neuro: Alert and oriented x3  Recommendations and Plan: -Shift focus of care to comfort, quality and dignity -DNR/DNI -No further diagnostics; lab draws cardiac monitoring -Discontinue milrinone and heparin -Symptom management      Morphine/Ativan -Regular diet -Follow-up meeting scheduled for tomorrow at noon for further clarification of transition of care.    Discussed with Dr. Gerri Lins and Tonye Becket NP  Time In: 1100 Time Out: 1215 Time Spent: 75 minute Greater than 50% of the time was spent in counseling and coordination of care  Lorinda Creed NP [facility] Palliative Medicine Team Team Cell Phone: [phone] Please utilize secure  chat with additional questions, if there is no response within 30 minutes please call the above phone number  Palliative Medicine Team providers are available by phone from 7am to 7pm daily and can be reached through the team cell phone.  Should this patient require assistance outside of these hours, please call the patient's attending  physician.

## 2020-12-02 NOTE — Care Management Important Message (Signed)
Important Message  Patient Details  Name: [name] MRN: [id number] Date of Birth: 26-Dec-1945   Medicare Important Message Given:  Yes     Renie Ora 12/02/2020, 9:15 AM

## 2020-12-02 NOTE — Progress Notes (Signed)
PROGRESS NOTE  [name] [id number] DOB: 1946/08/29 DOA: 11/17/2020 PCP: Hoyt Koch, MD  Brief History   [name] a 75 y.o.malewith medical history significant ofCAD status post CABG and PCI, chronic combined systolic and diastolic CHF/ischemic cardiomyopathy with LVEF 20-25% and history of VT status post ICD, hypertension, hyperlipidemia,CKD stage IIIa,history of prostate and tonsillar cancer, history of DVT presenting to the ED with complaints of shortness of breath.Patient reports dyspnea on exertion and cough for the past 3 to 4 days. He was having low-grade fevers earlier but has not had them for the past few days. He has also had 3 episodes of vomiting over the past week which has now resolved. Denies abdominal pain or diarrhea. States his ICD had shocked him prior to his recent hospitalization but he has not had any issues lately. He is vaccinated against Covid.   ED Course:Afebrile.Tachypneic with respiratory rate in the 20s.Not tachycardic.Not hypoxic. WBC 7.0, hemoglobin 10.3, hematocrit 33.9, platelet 186K.Sodium 140, potassium 3.7, chloride 107, bicarb 22, BUN 27, creatinine 1.7, glucose 106. High-sensitivity troponin mildly elevated but stable (29 >29).BNP significantly elevated at 3663. SARS-CoV-2 PCR test and influenza panel both negative.  Chest x-ray showing cardiomegaly and small effusions in the posterior costophrenic angles; no frank edema.Patient was given IV Lasix 20 mg.  Vascular surgery was consulted due to the patient's carotid artery stenosis, Left ICA stenosis >80%. The patient was evaluated by Dr. Donzetta Matters on 11/24/2020. He determined that if the patient were to meet criteria for LVAD placement, it would make sense to perform CEA during the same procedure. However, if the patient were not found to be a candidate for the LVAD procedure, then he would not be seen as a candidate for CEA.  Unfortunately due to the  patient's memory impairment/ poor functional status, and safety issues he is deemed not appropriate for LVAD. Cardiology has recommended that the patient be discharged to SNF on milrinone for palliation.   Palliative care has been consulted to assist the patient and his wife in making decisions about the next phase of his care. He is now total comfort care. Milrinone and Heparin have been discontinued. Discussions regarding details of transition are ongoing.  Consultants  . Cardiology . Vascular Surgery . LVAD Team. . Heart Failure Team . Palliative care  Procedures  . None  Antibiotics   Anti-infectives (From admission, onward)   None     Subjective  The patient is resting quietly in bed. No new complaints.  Objective   Vitals:  Vitals:   12/02/20 0504 12/02/20 1149  BP: 116/62 122/70  Pulse:  76  Resp: 20 20  Temp: 98.7 F (37.1 C) 98.5 F (36.9 C)  SpO2: 95% 96%    Exam:  Constitutional:  . The patient is awake, alert, and oriented x 3.  Respiratory:  . No increased work of breathing. . No wheezes, rales, or rhonchi . No tactile fremitus Cardiovascular:  . Regular rate and rhythm . No murmurs, ectopy, or gallups. . No lateral PMI. No thrills. Abdomen:  . Abdomen is soft, non-tender, non-distended . No hernias, masses, or organomegaly . Normoactive bowel sounds.  Musculoskeletal:  . No cyanosis, clubbing, or edema Skin:  . No rashes, lesions, ulcers . palpation of skin: no induration or nodules Neurologic:  . CN 2-12 intact . Sensation all 4 extremities intact Psychiatric:  . Mental status o Mood, affect congruent o Orientation to person, place, time  . judgment and insight appear intact  I  have personally reviewed the following:   Today's Data  . Vitals, BMP, CBC  Imaging  . CT head without contrast . Vascular ultrasound . CT Chest without contrast  Cardiology Data  . EKG . Echocardiogram (11/22/2020)  Scheduled Meds: . amiodarone   200 mg Oral BID  . aspirin EC  81 mg Oral Daily  . buPROPion  300 mg Oral Daily  . Chlorhexidine Gluconate Cloth  6 each Topical Daily  . escitalopram  20 mg Oral Daily  . feeding supplement  237 mL Oral BID BM  . mexiletine  150 mg Oral Q8H  . pantoprazole  40 mg Oral QAC breakfast  . sodium chloride flush  10-40 mL Intracatheter Q12H  . sodium chloride flush  3 mL Intravenous Q12H  . zolpidem  5 mg Oral QHS   Continuous Infusions: . heparin 1,250 Units/hr (12/02/20 8546)    Principal Problem:   VT (ventricular tachycardia) (HCC) Active Problems:   Hyperlipidemia with target LDL less than 70   Carotid artery disease (HCC)   Acute on chronic combined systolic and diastolic CHF (congestive heart failure) (HCC)   CHF exacerbation (HCC)   Acute on chronic kidney failure (HCC)   LOS: 15 days   A & P  Acute on chronic combined systolic and diastolic CHF: Patient presented with signs of fluid overload.  Chest x-ray shows cardiomegaly and small effusion in the posterior costophrenic angles. On admission the patient was found to have bibasilar rales, JVD and significantly elevated BNP of 3663. This was despite the use of milrinone drip at home. Echo from 10/24/2020 showing severely reduced ejection fraction of 20 to 25%, grade 2 diastolic dysfunction, moderate to severe MR. RHC on 11/23/2020 demonstrated mild pulmonary venous hypertension, improved volume status, and acceptable cardiac output on milrinone. The pulmonary artery pulsatility index was also found to be acceptable at 4.8. (Pulmonary artery pressure/right atrial pressure). Cardiology discontinued Lasix, Entresto, and Coreg. The patient has been continued on milrinone. Volume will be carefully monitored along with electrolytes and creatinine. Magnesium will be kept greater than 2 and potassium greater than 4. The patient is being evaluated by the VAD team, CTS and vascular surgery. Consideration is being given to CIR, but it is not  clear whether this will be pursued before or after VAD. Unfortunately due to the patient's memory impairment/ poor functional status, and safety issues he is deemed not appropriate for LVAD. Cardiology has recommended that the patient be discharged to SNF on milrinone for palliation. Palliative care has been consulted to assist the patient and his wife in making decisions about the next phase of his care. The patient is now total comfort care. Heparin and milrinone have been discontinued.  Monomorphic ventricular tachycardia: The patient was given loading dose of amiodarone and then was placed on a drip. He has been continued on amiodarone 400 mg twice daily and mexiletine, although today that has been reduced to 400 mg amiodarone daily and mexiletine 150 mg bid. The patient underwent recent left heart cath on 10/23/2020 without any targets for revascularization. The assistance of cardiology and EP is appreciated.  LV thrombus: Noted on echo on 12/26. Shows 20% ejection fraction with LV thrombus.  Severe MR.  Moderate RV dysfunction. Continue IV heparin as per pharmacy. He has not been converted to coumadin due to possible VAD. Heparin now discontinued. Patient is comfort care only.  Carotid Stenosis: Doppler demonstrated >80% stenosis of the left ICA. Vascular surgery was consulted. The patient was evaluated by  Dr. Donzetta Matters on 11/24/2020. He determined that if the patient were to meet criteria for LVAD placement, it would make sense to perform CEA during the same procedure. However, if the patient were not found to be a candidate for the LVAD procedure, then he would not be seen as a candidate for CEA. Heparin now discontinued as patient is total comfort care.  AKI on CKD stage IIIa: Kidney function improving. Creatinine today is down to 1.44. Baseline creatinine is 1.1. Entresto and Lasix was discontinued by cardiology. Continue to monitor creatinine, electrolytes, and volume status. Avoid nephrotoxic  substances and hypotension. No further lab draws as the patient is total comfort care.  Severe MR: Status post RHC on 12/27. Appreciate cardiology assistance. Pt is now total comfort care.  History of coronary artery disease status post CABG and PCI:  Patient denies ACS symptoms.  Troponin mildly elevated but stable 29x2.  Suspected demand ischemia from decompensated CHF. Pt is now total comfort care.  Hypokalemia: Resolved. Monitor. No further lab draws. Pt is now total comfort care.  Hypomagnesemia: Resolved. No further lab draws. Pt is now total comfort care.   Hypertension: Stable.  Continue Coreg and Entresto. Monitor blood pressure closely. Pt is now total comfort care.  Hyperlipidemia: Pt is now total comfort care.  GERD: Continue PPI  Depression/anxiety: Continue Lexapro, Wellbutrin and Ambien  Hypoalbuminemia: Prealbumin: 13. Dietitian consulted. Comfort care only.  I have seen and examined this patient myself. I have spent 35 minutes in his evaluation and care.  DVT prophylaxis: None Code Status: DNR, comfort care. Family Communication: None available. Disposition Plan: Home with hospice vs SNF with hospice..  Status is: Inpatient  Dispo: The patient is from: Home  Anticipated d/c is to: TBD  Anticipated d/c date is: TBD  Patient currently is not medically stable to d/c due to lack of safe discharge and plan.  Ciaira Natividad, DO [facility] Direct contact: see [email or web address]  7PM-7AM contact night coverage as above 12/02/2020, 3:42 PM  LOS: 8 days

## 2020-12-03 DIAGNOSIS — N189 Chronic kidney disease, unspecified: Secondary | ICD-10-CM

## 2020-12-03 DIAGNOSIS — R0600 Dyspnea, unspecified: Secondary | ICD-10-CM

## 2020-12-03 DIAGNOSIS — I472 Ventricular tachycardia: Secondary | ICD-10-CM | POA: Diagnosis not present

## 2020-12-03 NOTE — Progress Notes (Signed)
Inpatient Rehabilitation-Admissions Coordinator   Note pt is now total comfort care per attending note on 12/02/20. AC will no longer follow for possible admission and will sign off.   Cheri Rous, OTR/L  Rehab Admissions Coordinator  [phone] 12/03/2020 8:42 AM

## 2020-12-03 NOTE — Progress Notes (Signed)
   12/03/20 1500  Mobility  Activity Contraindicated/medical hold (Patient has transitioned to comfort care. Not appropiate for mobility)

## 2020-12-03 NOTE — Progress Notes (Signed)
This chaplain is present with the PMT Pt. and wife-Rickie for F/U spiritual care.  The Pt. is awake and alert.  The chaplain recognized a small reflection of the Pt. humor in his greeting. Rickie affirmed the Pt. does have a humorous side.  Rickie is looking forward to ongoing conversations from the medical team about the Pt. next steps with Hospice care.  The chaplain understands Rickie has the presence of a close friend when she needs to pause and rest.  The Pt. is relying on regular visits from the Catholic priest for spiritual care.  Rickie accepted F/U spiritual care and will  communicate with the PMT for updates on the Pt. care plan.

## 2020-12-03 NOTE — Progress Notes (Signed)
OT Cancellation Note  Patient Details Name: [name] MRN: [id number] DOB: [birth year]   Cancelled Treatment:    Reason Eval/Treat Not Completed: Other (comment) (Pt now total comfort care) [name] has transitioned to total comfort care, therefore Occupational Therapy is respectfully signing off. It has been a pleasure to be a part of [name]'s care.  Pollyann Glen E. Young, COTA/L Acute Rehabilitation Services [phone]  Cherlyn Cushing 12/03/2020, 12:54 PM

## 2020-12-03 NOTE — Progress Notes (Signed)
Patient ID: [name], male   DOB: 1946-05-18, 75 y.o.   MRN: [id number]    Palliative Medicine Inpatient Follow Up Note   HPI: 75 y.o. 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 EF 20% with LV thrombus, severe MR, moderate RV dysfunction. Began milrinone infusion.  Today is day 14 of this hospital stay and unfortunately patient was informed yesterday that he Is not felt to be a candidate for LVAD therapy  Patient and his family face treatment option decisions, advanced directive decisions and anticipatory care needs.   Today's Discussion: 12-03-2020  Created space and opportunity for family to explore their thought and feelings regarding current medical situation.    Both verbalize an understanding of the seriousness of the patient's medical condition, and the associated poor prognosis.  Patient and his wife tell me that they've had many conversations in the past and they both verbalized that quality of life is a priority and they would not desire aggressive life prolonging measures.  Patient and his wife are comfortable with the decision that was made yesterday to shift to a full comfort path.  Again  education offered on the difference between aggressive medical intervention path and a palliative comfort path for this patient at this time in this situation.  Education offered regarding the natural trajectory and expectations of end-stage heart disease and end-of-life without life prolonging medical interventions (Milrinone, heparin, monitoring, lab correction), solely  focusing on comfort and dignity.  Education offered on hospice benefit, services both in  the home and residential.    Questions and concerns addressed     Subjective Assessment:  Vital Signs Vitals:   12/03/20 0504 12/03/20 0800  BP: 123/77   Pulse:  70  Resp: 17   Temp: 98.9 F (37.2 C) 98.3 F (36.8 C)  SpO2: 95%     Intake/Output Summary (Last 24 hours) at 12/03/2020 0957 Last data filed at 12/03/2020 0500 Gross per 24 hour  Intake 733 ml  Output 1050 ml  Net -317 ml   Last Weight  Most recent update: 12/03/2020  5:08 AM   Weight  74.6 kg (164 lb 7.4 oz)            Gen:  Dyspneic on minimal exertion HEENT: moist mucous membranes CV: Regular rate and rhythm, ICD deactivated PULM: decreased BS in bases ABD: soft/nontender/nondistended EXT: No edema Neuro: Alert and oriented x3 Psych: patient reports visual hallucinations, they are not disturbing to him   Recommendations and Plan:  -Focus of care to comfort, quality and dignity -DNR/DNI -No further diagnostics; lab draws,  cardiac monitoring -Milrinone and heparin dc 12-02-20 -Symptom management      Morphine/Ativan -Regular diet -Follow-up again in the morning for further clarification of transition of care.   PMT will re-evaluate and make recommendation   Long discussion with patient and his wife recommending that the next steps and transition of care will likely be home with hospice.  Patient is clearly hospice eligible but unless he has further decline (will need to assess in the morning now that milrinone will have been off her 48 hours) he will not meet residential placement eligibility. Preference is Manufacturing engineer   Time Spent: 45 minute  Greater than 50% of the time was spent in counseling and coordination of care  Lorinda Creed NP [facility] Palliative Medicine Team Team Cell Phone: [phone] Please utilize secure chat with additional questions, if there is no response within 30 minutes please call the above phone number  Palliative Medicine Team providers are available by  phone from 7am to 7pm daily and can be reached through the team cell phone.  Should this patient require assistance outside of these hours, please call the patient's attending physician.

## 2020-12-03 NOTE — Progress Notes (Addendum)
Patient ID: [name], male   DOB: 04-27-1946, 75 y.o.   MRN: [id number]    Advanced Heart Failure Rounding Note   Subjective:    Yesterday transitioned to comfort care. Had fall with therapy.   Denies pain. Denies shortness of breath.    Objective:   Weight Range:  Vital Signs:   Temp:  [97.9 F (36.6 C)-98.9 F (37.2 C)] 98.3 F (36.8 C) (01/06 0800) Pulse Rate:  [70-76] 70 (01/06 0800) Resp:  [12-35] 17 (01/06 0504) BP: (122-135)/(70-78) 123/77 (01/06 0504) SpO2:  [95 %-99 %] 95 % (01/06 0504) Weight:  [74.6 kg] 74.6 kg (01/06 0504) Last BM Date: 11/30/20  Weight change: Filed Weights   12/01/20 0500 12/02/20 0034 12/03/20 0504  Weight: 77.4 kg 77.6 kg 74.6 kg    Intake/Output:   Intake/Output Summary (Last 24 hours) at 12/03/2020 0939 Last data filed at 12/03/2020 0500 Gross per 24 hour  Intake 733 ml  Output 1050 ml  Net -317 ml     Physical Exam: General: No resp difficulty. In bed.  HEENT: normal Neck: supple. no JVD. Carotids 2+ bilat; no bruits. No lymphadenopathy or thryomegaly appreciated. Cor: PMI nondisplaced. Regular rate & rhythm. No rubs, gallops or murmurs. Lungs: clear Abdomen: soft, nontender, nondistended. No hepatosplenomegaly. No bruits or masses. Good bowel sounds. Extremities: no cyanosis, clubbing, rash, edema. RUE PICC Neuro: alert & orientedx3, cranial nerves grossly intact. moves all 4 extremities w/o difficulty. Affect pleasant   Telemetry:  SR 70s  Labs: Basic Metabolic Panel: Recent Labs  Lab 11/28/20 0201 11/29/20 0317 11/30/20 0523 12/01/20 0535 12/02/20 0226  NA 140 140 139 137 138  K 4.0 4.3 3.6 3.8 4.0  CL 109 106 104 105 104  CO2 21* 25 23 23 24   GLUCOSE 99 102* 97 101* 104*  BUN 14 14 16 16 13   CREATININE 1.39* 1.43* 1.37* 1.31* 1.35*  CALCIUM 8.6* 8.8* 8.7* 8.7* 8.9    Liver Function Tests: No results for input(s): AST, ALT, ALKPHOS, BILITOT, PROT, ALBUMIN in the last 168 hours. No results for  input(s): LIPASE, AMYLASE in the last 168 hours. No results for input(s): AMMONIA in the last 168 hours.  CBC: Recent Labs  Lab 11/28/20 0201 11/29/20 0317 11/30/20 0523 12/01/20 0535 12/02/20 0226  WBC 6.3 6.2 6.0 6.7 7.7  HGB 9.4* 9.7* 9.1* 8.9* 9.5*  HCT 29.3* 30.6* 30.2* 29.9* 31.7*  MCV 83.5 84.5 85.6 85.7 85.9  PLT 228 253 235 232 252    Cardiac Enzymes: No results for input(s): CKTOTAL, CKMB, CKMBINDEX, TROPONINI in the last 168 hours.  BNP: BNP (last 3 results) Recent Labs    11/17/20 1645  BNP 3,663.4*    ProBNP (last 3 results) No results for input(s): PROBNP in the last 8760 hours.    Other results:  Imaging: DG CHEST PORT 1 VIEW  Result Date: 12/02/2020 CLINICAL DATA:  Dyspnea EXAM: PORTABLE CHEST 1 VIEW COMPARISON:  11/17/2020, 11/26/2020 FINDINGS: Right-sided PICC line terminates at the level of the superior cavoatrial junction. Single lead left-sided implanted cardiac device. Post CABG changes. Stable cardiomegaly. Previously noted small pleural effusions are not evident on frontal view. No new focal airspace consolidation. No pneumothorax. IMPRESSION: 1. Stable exam.  No new acute cardiopulmonary findings. 2. Previously noted small pleural effusions are not evident on frontal view. Electronically Signed   By: Davina Poke D.O.   On: 12/02/2020 14:34     Medications:     Scheduled Medications: . amiodarone  200 mg  Oral BID  . aspirin EC  81 mg Oral Daily  . buPROPion  300 mg Oral Daily  . Chlorhexidine Gluconate Cloth  6 each Topical Daily  . escitalopram  20 mg Oral Daily  . feeding supplement  237 mL Oral BID BM  . mexiletine  150 mg Oral Q8H  . pantoprazole  40 mg Oral QAC breakfast  . sodium chloride flush  10-40 mL Intracatheter Q12H  . sodium chloride flush  3 mL Intravenous Q12H  . zolpidem  5 mg Oral QHS    Infusions: . heparin 1,250 Units/hr (12/02/20 0821)    PRN Medications: acetaminophen **OR** acetaminophen,  guaiFENesin-dextromethorphan, loperamide, LORazepam, morphine injection, ondansetron (ZOFRAN) IV, sodium chloride flush   Assessment/Plan:   1.Acute on chronic systolic heart failure due to iCM, EF 20-25% -> cardiogenic shock - Echo 11/21 EF 20-25% mild RV dysfunction moderate to severe MR - EF 20% with LV thrombus. Severe MR. Moderate RV dysfunction. Now on heparin. No bleeding - NYHA IV.  Low output physiology confirmed Co-ox (12/25) 44%. - RHC 11/24/20 on RA 6, PCWP 19, CO 6.2/3.0  - Milrinone started on 12/25. Cath was on milrinone 0.25 mcg with stable hemodynamcis on cath.  - Yesterday he elected to transition to comfort care. Milrinone stopped. ICD has been deactivated.  - Doing ok today. Volume status stable.  -  Hold off on spiro.  -  No bb with shock - Off Entresto with worsening renal function.   2.Monomorphic ventricular tachycardia -No further recurrence. ICD VT zones have been adjusted. Continue amiodarone.  - Keep K> 4.0 Mg > 2.0 - Recent left heart catheterization on 10/23/2020 without any targets for revascularization - No VT - Continue amiodarone 200 mg daily and continue  + mexiletine 150 mg twice a day.    3.CAD status post CABG -Recent left heart cath with severe three-vessel CAD. -He has patent vein grafts. (LIMA -> LAD, SVG-> RCA, SVG -> OM2 -> OM3) - No chest pain.  -Continue aspirin and atorvastatin.   4.Severe mitral regurgitation - pending VAD  5. AKI  - due to cardiorenal/ATN - baseline 1.1 - No labs.   6. Hypokalemia - No labs.   7. Apical LV mural thrombus -Heparin stopped with transition to comfort.   8. Hypoalbuminemia.  Prealbumin 13 .  Consult dietitian   9. Carotid Disease - He had [id number] LICA stenosis on pre-surgical carotid dopplers. VVShas seen, if gets LVAD will need to fix concomitantly. - Off statin with transition to comfort.    10. Deconditioning PT following-- Concerned about his safety.   11. Memory  Issues  CT head with mild small vessel disease and cerebral atrophy.   Milrinone was stopped 1/5. Effects of milrinone can linger 24-48 hours.   Plan for Hospice at home versus SNF/[facility]?   Length of Stay: [place]  NP-C  12/03/2020, 9:39 AM  Advanced Heart Failure Team Pager [phone] (M-F; 7a - 4p)  Please contact [facility] for night-coverage after hours (4p -7a ) and weekends on [email or web address]  Patient seen and examined with the above-signed Advanced Practice Provider and/or Housestaff. I personally reviewed laboratory data, imaging studies and relevant notes. I independently examined the patient and formulated the important aspects of the plan. I have edited the note to reflect any of my changes or salient points. I have personally discussed the plan with the patient and/or family.  He is off milrinone. Very weak. Had fall with PT. Increasing cough. Denies  SOB. No CP  General:  Weak appearing. No resp difficulty HEENT: normal Neck: supple. no JVD. Carotids 2+ bilat; no bruits. No lymphadenopathy or thryomegaly appreciated. Cor: PMI nondisplaced. Regular rate & rhythm. 3/6 MR Lungs: clear Abdomen: soft, nontender, nondistended. No hepatosplenomegaly. No bruits or masses. Good bowel sounds. Extremities: no cyanosis, clubbing, rash, edema + tremor Neuro: alert & orientedx3, cranial nerves grossly intact. moves all 4 extremities w/o difficulty. Affect pleasant  He has been seen by Palliative Care. Now on comfort care. Disposition pending. Possible [facility] vs home with Hospice.  Arvilla Meres, MD  3:40 PM

## 2020-12-03 NOTE — Progress Notes (Signed)
PROGRESS NOTE  [name] HTD:[id number] DOB: [birth year] DOA: 11/17/2020 PCP: Peter Broker, MD  Brief History   Peter Night Morrisonis a 75 y.o.malewith medical history significant ofCAD status post CABG and PCI, chronic combined systolic and diastolic CHF/ischemic cardiomyopathy with LVEF 20-25% and history of VT status post ICD, hypertension, hyperlipidemia,CKD stage IIIa,history of prostate and tonsillar cancer, history of DVT presenting to the ED with complaints of shortness of breath.Patient reports dyspnea on exertion and cough for the past 3 to 4 days. He was having low-grade fevers earlier but has not had them for the past few days. He has also had 3 episodes of vomiting over the past week which has now resolved. Denies abdominal pain or diarrhea. States his ICD had shocked him prior to his recent hospitalization but he has not had any issues lately. He is vaccinated against Covid.   ED Course:Afebrile.Tachypneic with respiratory rate in the 20s.Not tachycardic.Not hypoxic. WBC 7.0, hemoglobin 10.3, hematocrit 33.9, platelet 186K.Sodium 140, potassium 3.7, chloride 107, bicarb 22, BUN 27, creatinine 1.7, glucose 106. High-sensitivity troponin mildly elevated but stable (29 >29).BNP significantly elevated at 3663. SARS-CoV-2 PCR test and influenza panel both negative.  Chest x-ray showing cardiomegaly and small effusions in the posterior costophrenic angles; no frank edema.Patient was given IV Lasix 20 mg.  Vascular surgery was consulted due to the patient's carotid artery stenosis, Left ICA stenosis >80%. The patient was evaluated by Dr. [name] on 11/24/2020. He determined that if the patient were to meet criteria for LVAD placement, it would make sense to perform CEA during the same procedure. However, if the patient were not found to be a candidate for the LVAD procedure, then he would not be seen as a candidate for CEA.  Unfortunately due to the  patient's memory impairment/ poor functional status, and safety issues he is deemed not appropriate for LVAD. Cardiology has recommended that the patient be discharged to SNF on milrinone for palliation.   Palliative care has been consulted to assist the patient and his wife in making decisions about the next phase of his care. He is now total comfort care. Milrinone and Heparin have been discontinued. Discussions regarding details of transition are ongoing.  The patient fell last night. No injuries noted by patient.  Consultants  . Cardiology . Vascular Surgery . LVAD Team. . Heart Failure Team . Palliative care  Procedures  . None  Antibiotics   Anti-infectives (From admission, onward)   None     Subjective  The patient is resting quietly in bed. No new complaints.  Objective   Vitals:  Vitals:   12/03/20 0504 12/03/20 0800  BP: 123/77   Pulse:  70  Resp: 17   Temp: 98.9 F (37.2 C) 98.3 F (36.8 C)  SpO2: 95%     Exam:  Constitutional:  . The patient is awake, alert, and oriented x 3.  Respiratory:  . No increased work of breathing. . No wheezes, rales, or rhonchi . No tactile fremitus Cardiovascular:  . Regular rate and rhythm . No murmurs, ectopy, or gallups. . No lateral PMI. No thrills. Abdomen:  . Abdomen is soft, non-tender, non-distended . No hernias, masses, or organomegaly . Normoactive bowel sounds.  Musculoskeletal:  . No cyanosis, clubbing, or edema Skin:  . No rashes, lesions, ulcers . palpation of skin: no induration or nodules Neurologic:  . CN 2-12 intact . Sensation all 4 extremities intact Psychiatric:  . Mental status o Mood, affect congruent o Orientation to person,  place, time  . judgment and insight appear intact  I have personally reviewed the following:   Today's Data  . Vitals, BMP, CBC  Imaging  . CT head without contrast . Vascular ultrasound . CT Chest without contrast  Cardiology Data   . EKG . Echocardiogram (11/22/2020)  Scheduled Meds: . amiodarone  200 mg Oral BID  . aspirin EC  81 mg Oral Daily  . buPROPion  300 mg Oral Daily  . Chlorhexidine Gluconate Cloth  6 each Topical Daily  . escitalopram  20 mg Oral Daily  . feeding supplement  237 mL Oral BID BM  . mexiletine  150 mg Oral Q8H  . pantoprazole  40 mg Oral QAC breakfast  . sodium chloride flush  10-40 mL Intracatheter Q12H  . sodium chloride flush  3 mL Intravenous Q12H  . zolpidem  5 mg Oral QHS   Continuous Infusions:   Principal Problem:   VT (ventricular tachycardia) (HCC) Active Problems:   Hyperlipidemia with target LDL less than 70   Carotid artery disease (HCC)   Acute on chronic combined systolic and diastolic CHF (congestive heart failure) (HCC)   CHF exacerbation (HCC)   Acute on chronic kidney failure (HCC)   LOS: 16 days   A & P  Acute on chronic combined systolic and diastolic CHF: Patient presented with signs of fluid overload.  Chest x-ray shows cardiomegaly and small effusion in the posterior costophrenic angles. On admission the patient was found to have bibasilar rales, JVD and significantly elevated BNP of 3663. This was despite the use of milrinone drip at home. Echo from 10/24/2020 showing severely reduced ejection fraction of 20 to 123456, grade 2 diastolic dysfunction, moderate to severe MR. RHC on 11/23/2020 demonstrated mild pulmonary venous hypertension, improved volume status, and acceptable cardiac output on milrinone. The pulmonary artery pulsatility index was also found to be acceptable at 4.8. (Pulmonary artery pressure/right atrial pressure). Cardiology discontinued Lasix, Entresto, and Coreg. The patient has been continued on milrinone. Volume will be carefully monitored along with electrolytes and creatinine. Magnesium will be kept greater than 2 and potassium greater than 4. The patient is being evaluated by the VAD team, CTS and vascular surgery. Consideration is being  given to CIR, but it is not clear whether this will be pursued before or after VAD. Unfortunately due to the patient's memory impairment/ poor functional status, and safety issues he is deemed not appropriate for LVAD. Cardiology has recommended that the patient be discharged to SNF on milrinone for palliation. Palliative care has been consulted to assist the patient and his wife in making decisions about the next phase of his care. The patient is now total comfort care. Heparin and milrinone have been discontinued.  Monomorphic ventricular tachycardia: The patient was given loading dose of amiodarone and then was placed on a drip. He has been continued on amiodarone 400 mg twice daily and mexiletine, although today that has been reduced to 400 mg amiodarone daily and mexiletine 150 mg bid. The patient underwent recent left heart cath on 10/23/2020 without any targets for revascularization. The assistance of cardiology and EP is appreciated.  LV thrombus: Noted on echo on 12/26. Shows 20% ejection fraction with LV thrombus.  Severe MR.  Moderate RV dysfunction. Continue IV heparin as per pharmacy. He has not been converted to coumadin due to possible VAD. Heparin now discontinued. Patient is comfort care only.  Carotid Stenosis: Doppler demonstrated >80% stenosis of the left ICA. Vascular surgery was consulted. The  patient was evaluated by Dr. Donzetta Matters on 11/24/2020. He determined that if the patient were to meet criteria for LVAD placement, it would make sense to perform CEA during the same procedure. However, if the patient were not found to be a candidate for the LVAD procedure, then he would not be seen as a candidate for CEA. Heparin now discontinued as patient is total comfort care.  AKI on CKD stage IIIa: Kidney function improving. Creatinine today is down to 1.44. Baseline creatinine is 1.1. Entresto and Lasix was discontinued by cardiology. Continue to monitor creatinine, electrolytes, and volume  status. Avoid nephrotoxic substances and hypotension. No further lab draws as the patient is total comfort care.  Severe MR: Status post RHC on 12/27. Appreciate cardiology assistance. Pt is now total comfort care.  History of coronary artery disease status post CABG and PCI:  Patient denies ACS symptoms.  Troponin mildly elevated but stable 29x2.  Suspected demand ischemia from decompensated CHF. Pt is now total comfort care.  Hypokalemia: Resolved. Monitor. No further lab draws. Pt is now total comfort care.  Hypomagnesemia: Resolved. No further lab draws. Pt is now total comfort care.   Hypertension: Stable.  Continue Coreg and Entresto. Monitor blood pressure closely. Pt is now total comfort care.  Hyperlipidemia: Pt is now total comfort care.  GERD: Continue PPI  Depression/anxiety: Continue Lexapro, Wellbutrin and Ambien  Hypoalbuminemia: Prealbumin: 13. Dietitian consulted. Comfort care only.  I have seen and examined this patient myself. I have spent 32 minutes in his evaluation and care.  DVT prophylaxis: None Code Status: DNR, comfort care. Family Communication: None available. Disposition Plan: Home with hospice vs SNF with hospice..  Status is: Inpatient  Dispo: The patient is from: Home  Anticipated d/c is to: TBD  Anticipated d/c date is: TBD  Patient currently is not medically stable to d/c due to lack of safe discharge and plan.  Shekera Beavers, DO [facility] Direct contact: see [email or web address]  7PM-7AM contact night coverage as above 12/03/2020, 2:52 PM  LOS: 8 days

## 2020-12-04 ENCOUNTER — Telehealth: Payer: Self-pay | Admitting: Internal Medicine

## 2020-12-04 DIAGNOSIS — I472 Ventricular tachycardia: Secondary | ICD-10-CM | POA: Diagnosis not present

## 2020-12-04 NOTE — Progress Notes (Signed)
Manufacturing engineer (ACC)  DME has been delivered and is in place at home.  Rickie, patient's wife, asks that pt be discharged at earliest convenience tomorrow morning.  ACC information and contact numbers given to [place]. Admission visit tentatively set for Sunday morning.  Above information shared with Alden Hipp Manager.  Please call with any questions or concerns.  Thank you for the opportunity to participate in this pt's care.  Domenic Moras, BSN, RN [employer] [phone] (24h on call)

## 2020-12-04 NOTE — Plan of Care (Signed)
  Problem: Education: Goal: Knowledge of General Education information will improve Description: Including pain rating scale, medication(s)/side effects and non-pharmacologic comfort measures Outcome: Progressing   Problem: Health Behavior/Discharge Planning: Goal: Ability to manage health-related needs will improve Outcome: Progressing   Problem: Clinical Measurements: Goal: Ability to maintain clinical measurements within normal limits will improve Outcome: Progressing Goal: Will remain free from infection Outcome: Progressing Goal: Diagnostic test results will improve Outcome: Progressing Goal: Respiratory complications will improve Outcome: Progressing Goal: Cardiovascular complication will be avoided Outcome: Progressing   Problem: Activity: Goal: Risk for activity intolerance will decrease Outcome: Progressing   Problem: Coping: Goal: Level of anxiety will decrease Outcome: Progressing   Problem: Elimination: Goal: Will not experience complications related to bowel motility Outcome: Progressing Goal: Will not experience complications related to urinary retention Outcome: Progressing   Problem: Safety: Goal: Ability to remain free from injury will improve Outcome: Progressing   Problem: Education: Goal: Understanding of CV disease, CV risk reduction, and recovery process will improve Outcome: Progressing Goal: Individualized Educational Video(s) Outcome: Progressing   Problem: Activity: Goal: Ability to return to baseline activity level will improve Outcome: Progressing   Problem: Cardiovascular: Goal: Ability to achieve and maintain adequate cardiovascular perfusion will improve Outcome: Progressing Goal: Vascular access site(s) Level 0-1 will be maintained Outcome: Progressing   Problem: Health Behavior/Discharge Planning: Goal: Ability to safely manage health-related needs after discharge will improve Outcome: Progressing   Problem: Education: Goal:  Ability to demonstrate management of disease process will improve Outcome: Progressing Goal: Ability to verbalize understanding of medication therapies will improve Outcome: Progressing   Problem: Activity: Goal: Capacity to carry out activities will improve Outcome: Progressing   Problem: Cardiac: Goal: Ability to achieve and maintain adequate cardiopulmonary perfusion will improve Outcome: Progressing

## 2020-12-04 NOTE — Telephone Encounter (Signed)
Fine to be attending and agree terminally ill.

## 2020-12-04 NOTE — Progress Notes (Signed)
PROGRESS NOTE  KENYE ROYE [id number] DOB: [birth year] DOA: 11/17/2020 PCP: Hoyt Koch, MD  Brief History   [name] a 75 y.o.malewith medical history significant ofCAD status post CABG and PCI, chronic combined systolic and diastolic CHF/ischemic cardiomyopathy with LVEF 20-25% and history of VT status post ICD, hypertension, hyperlipidemia,CKD stage IIIa,history of prostate and tonsillar cancer, history of DVT presenting to the ED with complaints of shortness of breath.Patient reports dyspnea on exertion and cough for the past 3 to 4 days. He was having low-grade fevers earlier but has not had them for the past few days. He has also had 3 episodes of vomiting over the past week which has now resolved. Denies abdominal pain or diarrhea. States his ICD had shocked him prior to his recent hospitalization but he has not had any issues lately. He is vaccinated against Covid.   ED Course:Afebrile.Tachypneic with respiratory rate in the 20s.Not tachycardic.Not hypoxic. WBC 7.0, hemoglobin 10.3, hematocrit 33.9, platelet 186K.Sodium 140, potassium 3.7, chloride 107, bicarb 22, BUN 27, creatinine 1.7, glucose 106. High-sensitivity troponin mildly elevated but stable (29 >29).BNP significantly elevated at 3663. SARS-CoV-2 PCR test and influenza panel both negative.  Chest x-ray showing cardiomegaly and small effusions in the posterior costophrenic angles; no frank edema.Patient was given IV Lasix 20 mg.  Vascular surgery was consulted due to the patient's carotid artery stenosis, Left ICA stenosis >80%. The patient was evaluated by Dr. Donzetta Matters on 11/24/2020. He determined that if the patient were to meet criteria for LVAD placement, it would make sense to perform CEA during the same procedure. However, if the patient were not found to be a candidate for the LVAD procedure, then he would not be seen as a candidate for CEA.  Unfortunately due to the  patient's memory impairment/ poor functional status, and safety issues he is deemed not appropriate for LVAD. Cardiology has recommended that the patient be discharged to SNF on milrinone for palliation.   The patient fell 0n 12/02/2020. No injuries noted by patient/nursing.  Palliative care has been consulted to assist the patient and his wife in making decisions about the next phase of his care. He is now total comfort care. Milrinone and Heparin have been discontinued. The patient and family have decided to go home with hospice. Hospital bed, bedside commode, and over the bed table will be deliver prior to discharge. Hospice order in place.  Consultants  . Cardiology . Vascular Surgery . LVAD Team. . Heart Failure Team . Palliative care  Procedures  . None  Antibiotics   Anti-infectives (From admission, onward)   None     Subjective  The patient is resting quietly in bed. No new complaints.  Objective   Vitals:  Vitals:   12/04/20 0923 12/04/20 1158  BP: 112/71 107/66  Pulse:  64  Resp: 16 20  Temp:  98 F (36.7 C)  SpO2:  99%    Exam:  Constitutional:  . The patient is sleeping. He is not awakened. No acute distress. Respiratory:  . No increased work of breathing. . No wheezes, rales, or rhonchi . No tactile fremitus Cardiovascular:  . Regular rate and rhythm . No murmurs, ectopy, or gallups. . No lateral PMI. No thrills. Abdomen:  . Abdomen is soft, non-tender, non-distended . No hernias, masses, or organomegaly . Normoactive bowel sounds.  Musculoskeletal:  . No cyanosis, clubbing, or edema Skin:  . No rashes, lesions, ulcers . palpation of skin: no induration or nodules Neurologic:  . CN  2-12 intact . Sensation all 4 extremities intact Psychiatric:  . Mental status o Mood, affect congruent o Orientation to person, place, time  . judgment and insight appear intact  I have personally reviewed the following:   Today's Data  . Vitals  Imaging   . CT head without contrast . Vascular ultrasound . CT Chest without contrast  Cardiology Data  . EKG . Echocardiogram (11/22/2020)  Scheduled Meds: . amiodarone  200 mg Oral BID  . aspirin EC  81 mg Oral Daily  . buPROPion  300 mg Oral Daily  . Chlorhexidine Gluconate Cloth  6 each Topical Daily  . escitalopram  20 mg Oral Daily  . feeding supplement  237 mL Oral BID BM  . mexiletine  150 mg Oral Q8H  . pantoprazole  40 mg Oral QAC breakfast  . sodium chloride flush  10-40 mL Intracatheter Q12H  . sodium chloride flush  3 mL Intravenous Q12H  . zolpidem  5 mg Oral QHS    Principal Problem:   VT (ventricular tachycardia) (HCC) Active Problems:   Hyperlipidemia with target LDL less than 70   Carotid artery disease (HCC)   Acute on chronic combined systolic and diastolic CHF (congestive heart failure) (HCC)   CHF exacerbation (HCC)   Acute on chronic kidney failure (HCC)   LOS: 17 days   A & P  Acute on chronic combined systolic and diastolic CHF: Patient presented with signs of fluid overload.  Chest x-ray shows cardiomegaly and small effusion in the posterior costophrenic angles. On admission the patient was found to have bibasilar rales, JVD and significantly elevated BNP of 3663. This was despite the use of milrinone drip at home. Echo from 10/24/2020 showing severely reduced ejection fraction of 20 to 21%, grade 2 diastolic dysfunction, moderate to severe MR. RHC on 11/23/2020 demonstrated mild pulmonary venous hypertension, improved volume status, and acceptable cardiac output on milrinone. The pulmonary artery pulsatility index was also found to be acceptable at 4.8. (Pulmonary artery pressure/right atrial pressure). Cardiology discontinued Lasix, Entresto, and Coreg. The patient has been continued on milrinone. Volume will be carefully monitored along with electrolytes and creatinine. Magnesium will be kept greater than 2 and potassium greater than 4. The patient is being  evaluated by the VAD team, CTS and vascular surgery. Consideration is being given to CIR, but it is not clear whether this will be pursued before or after VAD. Unfortunately due to the patient's memory impairment/ poor functional status, and safety issues he is deemed not appropriate for LVAD. Cardiology has recommended that the patient be discharged to SNF on milrinone for palliation. Palliative care has been consulted to assist the patient and his wife in making decisions about the next phase of his care. The patient is now total comfort care. Heparin and milrinone have been discontinued.  Monomorphic ventricular tachycardia: The patient was given loading dose of amiodarone and then was placed on a drip. He has been continued on amiodarone 400 mg twice daily and mexiletine, although today that has been reduced to 400 mg amiodarone daily and mexiletine 150 mg bid. The patient underwent recent left heart cath on 10/23/2020 without any targets for revascularization. The assistance of cardiology and EP is appreciated.  LV thrombus: Noted on echo on 12/26. Shows 20% ejection fraction with LV thrombus.  Severe MR.  Moderate RV dysfunction. Continue IV heparin as per pharmacy. He has not been converted to coumadin due to possible VAD. Heparin now discontinued. Patient is comfort care only.  Carotid Stenosis: Doppler demonstrated >80% stenosis of the left ICA. Vascular surgery was consulted. The patient was evaluated by Dr. Donzetta Matters on 11/24/2020. He determined that if the patient were to meet criteria for LVAD placement, it would make sense to perform CEA during the same procedure. However, if the patient were not found to be a candidate for the LVAD procedure, then he would not be seen as a candidate for CEA. Heparin now discontinued as patient is total comfort care.  AKI on CKD stage IIIa: Kidney function improving. Creatinine today is down to 1.44. Baseline creatinine is 1.1. Entresto and Lasix was  discontinued by cardiology. Continue to monitor creatinine, electrolytes, and volume status. Avoid nephrotoxic substances and hypotension. No further lab draws as the patient is total comfort care.  Severe MR: Status post RHC on 12/27. Appreciate cardiology assistance. Pt is now total comfort care.  History of coronary artery disease status post CABG and PCI:  Patient denies ACS symptoms.  Troponin mildly elevated but stable 29x2.  Suspected demand ischemia from decompensated CHF. Pt is now total comfort care.  Hypokalemia: Resolved. Monitor. No further lab draws. Pt is now total comfort care.  Hypomagnesemia: Resolved. No further lab draws. Pt is now total comfort care.   Hypertension: Stable.  Continue Coreg and Entresto. Monitor blood pressure closely. Pt is now total comfort care.  Hyperlipidemia: Pt is now total comfort care.  GERD: Continue PPI  Depression/anxiety: Continue Lexapro, Wellbutrin and Ambien  Hypoalbuminemia: Prealbumin: 13. Dietitian consulted. Comfort care only.  I have seen and examined this patient myself. I have spent 32 minutes in his evaluation and care.  DVT prophylaxis: None Code Status: DNR, comfort care. Family Communication: None available. Disposition Plan: Home with hospice vs SNF with hospice..  Status is: Inpatient  Dispo: The patient is from: Home  Anticipated d/c is to: Home with hospice  Anticipated d/c date is: 12/05/2020  Patient currently is not medically stable to d/c due to lack of safe discharge and plan.  Othal Kubitz, DO [facility] Direct contact: see [email or web address]  7PM-7AM contact night coverage as above 12/04/2020, 2:51 PM  LOS: 8 days

## 2020-12-04 NOTE — Progress Notes (Signed)
Manufacturing engineer [facility])  Received request from [facility] for hospice services at home after discharge.  Chart and pt information under review by [facility] physician.  Hospice eligibility pending at this time.  Hospital liaison spoke with [place], pt's wife, to initiate education related to hospice philosophy and services and to answer any questions at this time.  Rickie     verbalized understanding of information given.  Per discussion the plan is to discharge home tomorrow by PTAR.  Please send signed and completed DNR home with pt/family.  Please provide prescriptions at discharge as needed to ensure ongoing symptom management until pt can be admitted onto hospice services.    DME needs discussed.  Pt will need a hospital bed, BSC, and overbed table.   Address has been verified and is correct in the chart.  [facility] information and contact numbers given to [place].  Above information shared with Alden Hipp Manager.  Please call with any questions or concerns.  Thank you for the opportunity to participate in this pt's care.  Domenic Moras, BSN, RN [employer] [phone] (24h on call)

## 2020-12-04 NOTE — Progress Notes (Signed)
Patient ID: [name], male   DOB: 07-25-1946, 75 y.o.   MRN: [id number]    Advanced Heart Failure Rounding Note   Subjective:    Off milrinone x 48 hours. No real change. Feels very weak. Still with mild cough. No orthopnea or PND.    Objective:   Weight Range:  Vital Signs:   Temp:  [98 F (36.7 C)-99.4 F (37.4 C)] 98 F (36.7 C) (01/07 1158) Pulse Rate:  [64-70] 64 (01/07 1158) Resp:  [8-36] 20 (01/07 1158) BP: (102-116)/(63-77) 107/66 (01/07 1158) SpO2:  [95 %-100 %] 99 % (01/07 1158) Last BM Date: 12/03/20  Weight change: Filed Weights   12/01/20 0500 12/02/20 0034 12/03/20 0504  Weight: 77.4 kg 77.6 kg 74.6 kg    Intake/Output:   Intake/Output Summary (Last 24 hours) at 12/04/2020 1403 Last data filed at 12/04/2020 1317 Gross per 24 hour  Intake 709 ml  Output 575 ml  Net 134 ml     Physical Exam: General:  Weak appearing. No resp difficulty HEENT: normal Neck: supple. no JVD. Carotids 2+ bilat; no bruits. No lymphadenopathy or thryomegaly appreciated. Cor: PMI nondisplaced. Regular rate & rhythm. 3/6 MR Lungs: clear Abdomen: soft, nontender, nondistended. No hepatosplenomegaly. No bruits or masses. Good bowel sounds. Extremities: no cyanosis, clubbing, rash, edema Neuro: alert & orientedx3, cranial nerves grossly intact. moves all 4 extremities w/o difficulty. Affect pleasant   Telemetry:  SR 60-70s Personally reviewed  Labs: Basic Metabolic Panel: Recent Labs  Lab 11/28/20 0201 11/29/20 0317 11/30/20 0523 12/01/20 0535 12/02/20 0226  NA 140 140 139 137 138  K 4.0 4.3 3.6 3.8 4.0  CL 109 106 104 105 104  CO2 21* 25 23 23 24   GLUCOSE 99 102* 97 101* 104*  BUN 14 14 16 16 13   CREATININE 1.39* 1.43* 1.37* 1.31* 1.35*  CALCIUM 8.6* 8.8* 8.7* 8.7* 8.9    Liver Function Tests: No results for input(s): AST, ALT, ALKPHOS, BILITOT, PROT, ALBUMIN in the last 168 hours. No results for input(s): LIPASE, AMYLASE in the last 168 hours. No  results for input(s): AMMONIA in the last 168 hours.  CBC: Recent Labs  Lab 11/28/20 0201 11/29/20 0317 11/30/20 0523 12/01/20 0535 12/02/20 0226  WBC 6.3 6.2 6.0 6.7 7.7  HGB 9.4* 9.7* 9.1* 8.9* 9.5*  HCT 29.3* 30.6* 30.2* 29.9* 31.7*  MCV 83.5 84.5 85.6 85.7 85.9  PLT 228 253 235 232 252    Cardiac Enzymes: No results for input(s): CKTOTAL, CKMB, CKMBINDEX, TROPONINI in the last 168 hours.  BNP: BNP (last 3 results) Recent Labs    11/17/20 1645  BNP 3,663.4*    ProBNP (last 3 results) No results for input(s): PROBNP in the last 8760 hours.    Other results:  Imaging: DG CHEST PORT 1 VIEW  Result Date: 12/02/2020 CLINICAL DATA:  Dyspnea EXAM: PORTABLE CHEST 1 VIEW COMPARISON:  11/17/2020, 11/26/2020 FINDINGS: Right-sided PICC line terminates at the level of the superior cavoatrial junction. Single lead left-sided implanted cardiac device. Post CABG changes. Stable cardiomegaly. Previously noted small pleural effusions are not evident on frontal view. No new focal airspace consolidation. No pneumothorax. IMPRESSION: 1. Stable exam.  No new acute cardiopulmonary findings. 2. Previously noted small pleural effusions are not evident on frontal view. Electronically Signed   By: Davina Poke D.O.   On: 12/02/2020 14:34     Medications:     Scheduled Medications: . amiodarone  200 mg Oral BID  . aspirin EC  81 mg  Oral Daily  . buPROPion  300 mg Oral Daily  . Chlorhexidine Gluconate Cloth  6 each Topical Daily  . escitalopram  20 mg Oral Daily  . feeding supplement  237 mL Oral BID BM  . mexiletine  150 mg Oral Q8H  . pantoprazole  40 mg Oral QAC breakfast  . sodium chloride flush  10-40 mL Intracatheter Q12H  . sodium chloride flush  3 mL Intravenous Q12H  . zolpidem  5 mg Oral QHS    Infusions:   PRN Medications: acetaminophen **OR** acetaminophen, guaiFENesin-dextromethorphan, loperamide, LORazepam, morphine injection, ondansetron (ZOFRAN) IV, sodium  chloride flush   Assessment/Plan:   1.Acute on chronic systolic heart failure due to iCM, EF 20-25% -> cardiogenic shock - Echo 11/21 EF 20-25% mild RV dysfunction moderate to severe MR - EF 20% with LV thrombus. Severe MR. Moderate RV dysfunction. Now on heparin. No bleeding - NYHA IV.  Low output physiology confirmed Co-ox (12/25) 44%. - RHC 11/24/20 on RA 6, PCWP 19, CO 6.2/3.0  - Milrinone started on 12/25. Cath was on milrinone 0.25 mcg with stable hemodynamcis on cath.  - Evaluated for VAD and felt not to be candidate  - Milrinone off 1/5. ICD has been deactivated.  - Doing ok today. Volume status stable off lasix - Has decided to go home with Hospice  -  No bb with shock  2.Monomorphic ventricular tachycardia -No further recurrence. ICD VT zones have been adjusted. Continue amiodarone.  - Keep K> 4.0 Mg > 2.0 - Recent left heart catheterization on 10/23/2020 without any targets for revascularization - No VT - Continue amiodarone 200 mg daily and continue  + mexiletine 150 mg twice a day.    3.CAD status post CABG -Recent left heart cath with severe three-vessel CAD. -He has patent vein grafts. (LIMA -> LAD, SVG-> RCA, SVG -> OM2 -> OM3) - No chest pain.  -Continue aspirin and atorvastatin.   4.Severe mitral regurgitation - no change  5. AKI  - due to cardiorenal/ATN - resolved  6. Hypokalemia - No labs.   7. Apical LV mural thrombus -Heparin stopped with transition to comfort.   8. Hypoalbuminemia.  Prealbumin 13 .   9. Carotid Disease - He had >75% LICA stenosis on pre-surgical carotid dopplers. VVShas seen, if gets LVAD will need to fix concomitantly. - Off statin with transition to comfort.    10. Deconditioning PT following-- Concerned about his safety.   11. Memory Issues  CT head with mild small vessel disease and cerebral atrophy.    Agree with plan home with Hospice  Cardiac meds for d/c:  Amiodarone 200 daily  Mexilitene  150 bid ASA 81 Entresto 24/26 bid Lasix 40mg  Monday and Friday (can take extra as needed)    Stop carvedilol and atorva  HF team will sign off. Call with questions.    Length of Stay: 17   Glori Bickers MD 12/04/2020, 2:03 PM  Advanced Heart Failure Team Pager [phone] (M-F; 7a - 4p)  Please contact [facility] for night-coverage after hours (4p -7a ) and weekends on [email or web address]

## 2020-12-04 NOTE — TOC Initial Note (Signed)
Transition of Care [facility]) - Initial/Assessment Note    Patient Details  Name: [name] MRN: [id number] Date of Birth: 1946-07-15  Transition of Care [facility]) CM/SW Contact:    Zenon Mayo, RN Phone Number: 12/04/2020, 10:24 AM  Clinical Narrative:                 NCM received call from [place] with [facility], stating she got referral for home hospice.  NCM confirmed with patient that he would like [facility], he states yes.  He states he will need hospital bed and bsc and bedside table.  NCM spoke with Chrislynn to confirm this information.  Wife states he will need ambulance transport, NCM confirmed address.  She also states DME will need to be there before patient gets there.   Expected Discharge Plan: Home w Hospice Care Barriers to Discharge: Continued Medical Work up   Patient Goals and CMS Choice Patient states their goals for this hospitalization and ongoing recovery are:: home with hospice CMS Medicare.gov Compare Post Acute Care list provided to:: Patient Choice offered to / list presented to : Patient  Expected Discharge Plan and Services Expected Discharge Plan: Home w Hospice Care   Discharge Planning Services: CM Consult Post Acute Care Choice: Hospice Living arrangements for the past 2 months: Single Family Home                 DME Arranged:  ([facility] will supply the DME)         HH Arranged: RN HH Agency: [facility]ospice and [facility] Date [facility] Agency Contacted: 12/04/20 Time HH Agency Contacted: 66 Representative spoke with at [employer]: [place] Arrangements/Services Living arrangements for the past 2 months: [place] with:: Spouse Patient language and need for interpreter reviewed:: Yes Do you feel safe going back to the place where you live?: Yes      Need for Family Participation in Patient Care: Yes (Comment) Care giver support system in place?: Yes (comment)   Criminal  Activity/Legal Involvement Pertinent to Current Situation/Hospitalization: No - Comment as needed  Activities of Daily Living Home Assistive Devices/Equipment: None ADL Screening (condition at time of admission) Patient's cognitive ability adequate to safely complete daily activities?: Yes Is the patient deaf or have difficulty hearing?: No Does the patient have difficulty seeing, even when wearing glasses/contacts?: No Does the patient have difficulty concentrating, remembering, or making decisions?: Yes Patient able to express need for assistance with ADLs?: Yes Does the patient have difficulty dressing or bathing?: No Independently performs ADLs?: Yes (appropriate for developmental age) Communication: Independent Dressing (OT): Independent Grooming: Independent Feeding: Independent Bathing: Independent Toileting: Independent In/Out Bed: Independent Walks in Home: Independent Does the patient have difficulty walking or climbing stairs?: No Weakness of Legs: None Weakness of Arms/Hands: None  Permission Sought/Granted                  Emotional Assessment Appearance:: Appears stated age Attitude/Demeanor/Rapport: Engaged Affect (typically observed): Appropriate Orientation: : Oriented to Self,Oriented to Place,Oriented to  Time,Oriented to Situation Alcohol / Substance Use: Not Applicable Psych Involvement: No (comment)  Admission diagnosis:  CHF exacerbation ([place]) [I50.9] Congestive heart failure, unspecified HF chronicity, unspecified heart failure type ([place]) [I50.9] Patient Active Problem List   Diagnosis Date Noted  . Acute on chronic kidney failure ([place]) 11/18/2020  . CHF exacerbation ([place]) 11/17/2020  . Fever 11/13/2020  . Cough 11/13/2020  . DOE (dyspnea on exertion) 10/23/2020  . URI (upper  respiratory infection) 10/23/2020  . Medicare annual wellness visit, subsequent 05/19/2017  . Allergic rhinitis 05/05/2017  . Orthostatic hypotension 05/11/2016  .  Elevated LFTs 03/24/2016  . Impaired fasting blood sugar 01/10/2016  . Acute on chronic combined systolic and diastolic CHF (congestive heart failure) ([place]) 12/30/2015  . Epigastric pain 12/21/2015  . Atypical chest pain 12/21/2015  . ST elevation myocardial infarction (STEMI) ([place])   . Hx of CABG   . Adult ADHD   . Ejection fraction < 50%   . ICD (implantable cardioverter-defibrillator) in place 03/10/2015  . VT (ventricular tachycardia) ([place]) 03/05/2014  . Routine health maintenance 12/11/2013  . Advanced care planning/counseling discussion 12/10/2013  . Cardiomyopathy, ischemic   . Coronary artery disease, occlusive   . Essential hypertension   . Hyperlipidemia with target LDL less than 70   . Encounter for servicing of automatic implantable cardioverter-defibrillator (AICD) at end of battery life   . Carotid artery disease ([place])   . Prostate cancer ([place]) 11/09/2011  . 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 ESOPHAGEAL STRICTURE 08/23/2008   PCP:  Hoyt Koch, MD Pharmacy:   [facility] [place], [place] - [address] [place] [address] [place] [address] Phone: [phone] Fax: [phone]     Social Determinants of Health (SDOH) Interventions    Readmission Risk Interventions Readmission Risk Prevention Plan 12/04/2020  Transportation Screening Complete  PCP or Specialist Appt within 5-7 Days Complete  Home Care Screening Complete  Medication Review (RN CM) Complete  Some recent data might be hidden

## 2020-12-04 NOTE — Progress Notes (Signed)
Palliative Medicine RN Note: Our team rec'd a call from wife Rickie [phone]). She asked if [facility] hospice will be delivering equipment to her house today. No hospice order has been placed or called in yet. I obtained the order and called [facility] (Chrislyn), who will call her asap to let her know they will work on getting equipment ordered.  Marjie Skiff Atalie Oros, RN, BSN, [facility] Palliative Medicine Team 12/04/2020 10:05 AM Office [phone]

## 2020-12-04 NOTE — Telephone Encounter (Signed)
°  [facility] made aware of Dr Nathanial Millman response

## 2020-12-04 NOTE — Telephone Encounter (Signed)
Albina Billet from [facility] calling, patient is getting discharged from the hospital tomorrow to hospice and they are calling to see if Dr. Sharlet Salina will be willing to be the attending provider for the patient as well as she agrees that the patient is terminally ill.  Albina Billet- [phone] Okay to LVM

## 2020-12-04 NOTE — TOC Progression Note (Signed)
Transition of Care [facility]) - Progression Note    Patient Details  Name: [name] MRN: [id number] Date of Birth: 1946/03/06  Transition of Care [facility] CM/SW [place], [place] Phone Number: 12/04/2020, 4:46 PM  Clinical Narrative:     [facility] notified TOC that equipment is in home. Family is requesting AM discharge. Please schedule [facility]AR transport as soon as possible.    Expected Discharge Plan: Home w Hospice Care Barriers to Discharge: Continued Medical Work up  Expected Discharge Plan and Services Expected Discharge Plan: [place]   Discharge Planning Services: CM Consult Post Acute Care Choice: Hospice Living arrangements for the past 2 months: Single Family Home                 DME Arranged:  ([facility] will supply the DME)         HH Arranged: RN [place] Agency: Hospice and [place] Date [place]: 12/04/20 Time [place]: 1020 Representative spoke with at [place]: [place] ([place]) Interventions    Readmission Risk Interventions Readmission Risk Prevention Plan 12/04/2020  Transportation Screening Complete  PCP or Specialist Appt within 5-7 Days Complete  Home Care Screening Complete  Medication Review (RN CM) Complete  Some recent data might be hidden

## 2020-12-04 NOTE — Progress Notes (Signed)
Nutrition Brief Note  Chart reviewed. Pt now transitioning to comfort care.  No further nutrition interventions warranted at this time.  Please re-consult as needed.   Cristiano Capri W, RD, LDN, CDCES Registered Dietitian II Certified Diabetes Care and Education Specialist Please refer to [facility] for RD and/or RD on-call/weekend/after hours pager  

## 2020-12-05 MED ORDER — ENSURE ENLIVE PO LIQD: 237.0000 mL | Freq: Two times a day (BID) | ORAL | 12 refills | Status: AC

## 2020-12-05 MED ORDER — LOPERAMIDE HCL 2 MG PO CAPS
2.0000 mg | ORAL_CAPSULE | ORAL | 0 refills | Status: AC | PRN
Start: 2020-12-05 — End: ?

## 2020-12-05 MED ORDER — PROCHLORPERAZINE 25 MG RE SUPP: 25.0000 mg | RECTAL | 0 refills | Status: DC | PRN

## 2020-12-05 MED ORDER — MEXILETINE HCL 150 MG PO CAPS: 150.0000 mg | ORAL_CAPSULE | Freq: Three times a day (TID) | ORAL | 0 refills | Status: DC

## 2020-12-05 MED ORDER — ONDANSETRON HCL 4 MG PO TABS
4.0000 mg | ORAL_TABLET | Freq: Three times a day (TID) | ORAL | 0 refills | Status: AC | PRN
Start: 2020-12-05 — End: ?

## 2020-12-05 MED ORDER — LORAZEPAM 0.5 MG PO TABS: 0.5000 mg | ORAL_TABLET | ORAL | 0 refills | Status: AC | PRN

## 2020-12-05 MED ORDER — MORPHINE SULFATE (CONCENTRATE) 20 MG/ML PO SOLN: 10.0000 mg | ORAL | 0 refills | Status: AC | PRN

## 2020-12-05 MED ORDER — PROCHLORPERAZINE MALEATE 10 MG PO TABS: 10.0000 mg | ORAL_TABLET | Freq: Four times a day (QID) | ORAL | 0 refills | Status: AC | PRN

## 2020-12-05 MED ORDER — ALBUTEROL SULFATE (2.5 MG/3ML) 0.083% IN NEBU: 2.5000 mg | INHALATION_SOLUTION | RESPIRATORY_TRACT | 0 refills | Status: DC | PRN

## 2020-12-05 MED ORDER — HYOSCYAMINE SULFATE 0.125 MG SL SUBL: 0.1250 mg | SUBLINGUAL_TABLET | SUBLINGUAL | 0 refills | Status: AC | PRN

## 2020-12-05 NOTE — Discharge Summary (Addendum)
Physician Discharge Summary  [name]:[id number] DOB: 12-May-1946 DOA: 11/17/2020  PCP: Hoyt Koch, MD  Admit date: 11/17/2020 Discharge date: 12/05/2020  Recommendations for Outpatient Follow-up:  1. Discharge to home with hospice for end of life care.   Follow-up Information    [facility] Follow up.   Why: home hospice Contact information: [place] [phone]             Discharge Diagnoses: Principal diagnosis is #1 1. Acute on chronic combined systolic and diastolic CHF. EF 20-25% 2. Monomorphic Ventricular tachycardia  3. LV thrombus 4. Carotid stenosis 5. AKI on CKD IIIa 6. Severe Mitral regurgitation 7. CAD s/p CABG and PCI 8. Hypokalemia 9. Hypomagnesemia 10. Hypertension 11. Hyperlipidemia 12. GERD 13. Depression/anxiety 14. Hypoalbuminemia  Discharge Condition: Poor  Disposition: Home with hospice  Diet recommendation: As tolerated  Filed Weights   12/02/20 0034 12/03/20 0504 12/05/20 0451  Weight: 77.6 kg 74.6 kg 75.2 kg    History of present illness: [name] is a 75 y.o. male with medical history significant of CAD status post CABG and PCI, chronic combined systolic and diastolic CHF/ischemic cardiomyopathy with LVEF 20-25% and history of VT status post ICD, hypertension, hyperlipidemia, CKD stage IIIa, history of prostate and tonsillar cancer, history of DVT presenting to the ED with complaints of shortness of breath.  Patient reports dyspnea on exertion and cough for the past 3 to 4 days.  He was having low-grade fevers earlier but has not had them for the past few days.  He has also had 3 episodes of vomiting over the past week which has now resolved.  Denies abdominal pain or diarrhea.  States his ICD had shocked him prior to his recent hospitalization but he has not had any issues lately.  He is vaccinated against Covid.  States he has been checked for Covid  multiple times recently and every time results came back negative.  ED Course: Afebrile.  Tachypneic with respiratory rate in the 20s.  Not tachycardic.  Not hypoxic.  WBC 7.0, hemoglobin 10.3, hematocrit 33.9, platelet 186K.  Sodium 140, potassium 3.7, chloride 107, bicarb 22, BUN 27, creatinine 1.7, glucose 106.  High-sensitivity troponin mildly elevated but stable (29 >29).  BNP significantly elevated at 3663.  SARS-CoV-2 PCR test and influenza panel both negative.  Chest x-ray showing cardiomegaly and small effusions in the posterior costophrenic angles; no frank edema.  Hospital Course: [name] a 75 y.o.malewith medical history significant ofCAD status post CABG and PCI, chronic combined systolic and diastolic CHF/ischemic cardiomyopathy with LVEF 20-25% and history of VT status post ICD, hypertension, hyperlipidemia,CKD stage IIIa,history of prostate and tonsillar cancer, history of DVT presenting to the ED with complaints of shortness of breath.Patient reports dyspnea on exertion and cough for the past 3 to 4 days. He was having low-grade fevers earlier but has not had them for the past few days. He has also had 3 episodes of vomiting over the past week which has now resolved. Denies abdominal pain or diarrhea. States his ICD had shocked him prior to his recent hospitalization but he has not had any issues lately. He is vaccinated against Covid.   ED Course:Afebrile.Tachypneic with respiratory rate in the 20s.Not tachycardic.Not hypoxic. WBC 7.0, hemoglobin 10.3, hematocrit 33.9, platelet 186K.Sodium 140, potassium 3.7, chloride 107, bicarb 22, BUN 27, creatinine 1.7, glucose 106. High-sensitivity troponin mildly elevated but stable (29 >29).BNP significantly elevated at 3663. SARS-CoV-2 PCR test and influenza  panel both negative.  Chest x-ray showing cardiomegaly and small effusions in the posterior costophrenic angles; no frank edema.Patient was given  IV Lasix 20 mg.  Vascular surgery was consulted due to the patient's carotid artery stenosis, Left ICA stenosis >80%. The patient was evaluated by Dr. Donzetta Matters on 11/24/2020. He determined that if the patient were to meet criteria for LVAD placement, it would make sense to perform CEA during the same procedure. However, if the patient were not found to be a candidate for the LVAD procedure, then he would not be seen as a candidate for CEA.  Unfortunately due to the patient's memory impairment/ poor functional status, and safety issues he is deemed not appropriate for LVAD. Cardiology has recommended that the patient be discharged to SNF on milrinone for palliation.   The patient fell 0n 12/02/2020. No injuries noted by patient/nursing.  Palliative care has been consulted to assist the patient and his wife in making decisions about the next phase of his care. He is now total comfort care. Milrinone and Heparin have been discontinued. The patient and family have decided to go home with hospice. Hospital bed, bedside commode, and over the bed table will be deliver prior to discharge. Hospice order in place.  Today's assessment: S: The patient is resting comfortably. No new complaints. O: Vitals:  Vitals:   12/05/20 0451 12/05/20 1144  BP: 118/78 95/67  Pulse: 67 69  Resp: 20 20  Temp: 97.6 F (36.4 C)   SpO2: 98% 100%    Exam:  Constitutional:   The patient is sleeping. He is not awakened. No acute distress. Respiratory:   No increased work of breathing.  No wheezes, rales, or rhonchi  No tactile fremitus Cardiovascular:   Regular rate and rhythm  No murmurs, ectopy, or gallups.  No lateral PMI. No thrills. Abdomen:   Abdomen is soft, non-tender, non-distended  No hernias, masses, or organomegaly  Normoactive bowel sounds.  Musculoskeletal:   No cyanosis, clubbing, or edema Skin:   No rashes, lesions, ulcers  palpation of skin: no induration or  nodules Neurologic:   CN 2-12 intact  Sensation all 4 extremities intact Psychiatric:   Mental status ? Mood, affect congruent ? Orientation to person, place, time   judgment and insight appear intact    Discharge Instructions  Discharge Instructions    Call MD for:  difficulty breathing, headache or visual disturbances   Complete by: As directed    Call MD for:  severe uncontrolled pain   Complete by: As directed    Diet - low sodium heart healthy   Complete by: As directed    Discharge instructions   Complete by: As directed    Discharge to home with hospice for end of life care.     Allergies as of 12/05/2020      Reactions   Other Anaphylaxis   Insect bites (Has EpiPen)      Medication List    STOP taking these medications   atorvastatin 80 MG tablet Commonly known as: LIPITOR   carvedilol 12.5 MG tablet Commonly known as: COREG   doxycycline 100 MG tablet Commonly known as: VIBRA-TABS   EPINEPHrine 0.3 mg/0.3 mL Soaj injection Commonly known as: EPI-PEN   HYDROcodone-homatropine 5-1.5 MG/5ML syrup Commonly known as: HYCODAN   sacubitril-valsartan 24-26 MG Commonly known as: ENTRESTO     TAKE these medications   amiodarone 400 MG tablet Commonly known as: PACERONE 400 mg twice a day for 2 weeks, then 400 mg  daily for 2 weeks, then 200mg  daily thereafter What changed:   how much to take  how to take this  when to take this  additional instructions   aspirin 81 MG EC tablet Take 81 mg by mouth daily.   buPROPion 300 MG 24 hr tablet Commonly known as: WELLBUTRIN XL Take 300 mg by mouth daily.   escitalopram 20 MG tablet Commonly known as: LEXAPRO Take 20 mg by mouth daily.   esomeprazole 20 MG packet Commonly known as: NEXIUM Take 20 mg by mouth daily before breakfast.   feeding supplement Liqd Take 237 mLs by mouth 2 (two) times daily between meals.   hyoscyamine 0.125 MG SL tablet Commonly known as: LEVSIN SL Place 1 tablet  (0.125 mg total) under the tongue every 4 (four) hours as needed (excess oral secretions).   loperamide 2 MG capsule Commonly known as: IMODIUM Take 1 capsule (2 mg total) by mouth as needed for diarrhea or loose stools.   LORazepam 0.5 MG tablet Commonly known as: ATIVAN Take 1 tablet (0.5 mg total) by mouth every 4 (four) hours as needed for anxiety. May crush, mix with water and give sublingually if needed.   mexiletine 150 MG capsule Commonly known as: MEXITIL Take 1 capsule (150 mg total) by mouth every 8 (eight) hours.   morphine 20 MG/ML concentrated solution Commonly known as: ROXANOL Take 0.5 mLs (10 mg total) by mouth every 4 (four) hours as needed for severe pain or shortness of breath. May give sublingually if needed.   nitroGLYCERIN 0.4 MG SL tablet Commonly known as: NITROSTAT Place 1 tablet (0.4 mg total) under the tongue every 5 (five) minutes as needed for chest pain.   Ocuvite PreserVision Tabs Take 1 tablet by mouth daily.   prochlorperazine 10 MG tablet Commonly known as: COMPAZINE Take 1 tablet (10 mg total) by mouth every 6 (six) hours as needed for nausea or vomiting.   zolpidem 10 MG tablet Commonly known as: AMBIEN Take 1 tablet by mouth at bedtime.            Durable Medical Equipment  (From admission, onward)         Start     Ordered   12/04/20 1448  For home use only DME Bedside commode  Once       Question:  Patient needs a bedside commode to treat with the following condition  Answer:  Debility   12/04/20 1447   12/04/20 1448  For home use only DME Hospital bed  Once       Question Answer Comment  Length of Need Lifetime   The above medical condition requires: Patient requires the ability to reposition frequently   Head must be elevated greater than: 30 degrees   Bed type Semi-electric      12/04/20 1447   12/04/20 1448  For home use only DME Overbed table  Once        12/04/20 1447   12/01/20 1021  Heart failure home health  orders  (Heart failure home health orders / Face to face)  Once       Comments: Heart Failure Follow-up Care:  Verify follow-up appointments per Patient Discharge Instructions. Confirm transportation arranged. Reconcile home medications with discharge medication list. Remove discontinued medications from use. Assist patient/caregiver to manage medications using pill box. Reinforce low sodium food selection Assessments: Vital signs and oxygen saturation at each visit. Assess home environment for safety concerns, caregiver support and availability of low-sodium foods. Consult  Social Worker, PT/OT, Dietitian, and CNA based on assessments. Perform comprehensive cardiopulmonary assessment. Notify MD for any change in condition or weight gain of 3 pounds in one day or 5 pounds in one week with symptoms. Daily Weights and Symptom Monitoring: Ensure patient has access to scales. Teach patient/caregiver to weigh daily before breakfast and after voiding using same scale and record.    Teach patient/caregiver to track weight and symptoms and when to notify Provider. Activity: Develop individualized activity plan with patient/caregiver.   Home Paraenteral Inotropic Therapy : Data Collection Form  Patients name: SAED WIGGINTON   Date: 12/01/20  Information below may not be completed by the supplier nor anyone in a Financial relationship with the supplier. NYHA IV   1. Results of invasive hemodynamic monitoring  Cardiac Index Before Inotrope infusion:           1.5              On Inotrope infusion:            1.9              Drug and dose:   Milrinone 0.375 mcg.  2. Cardiac medications immediately prior to inotrope infusion (List name, dose, and frequency) None  3. Dose this represent maximum tolerated doses of these medications? Yes.   4. Breathing status Prior to inotrope infusion: Dyspnea at rest  At time of discharge: Dyspnea on moderate exertion.   5. Initial home  prescription Drug and Dose:   0.375 mcg for continuous infusion 24/hr day and 7 days/week  6. If continuous infusion is prescribed, have attempts to discontinue inotrope infusion in the hospital failed?   Yes.   7. If intermittent infusion is prescribed, have there been repeated hospitalizations for heart failure which Parenteral inotrope were required? Not applicable.   8. Is patient capable of going to the physician for outpatient evaluation? Yes.    9. Is routine electrocardiographic monitoring required in the Home?  No.   The above statements and any additional explanations included separately are true and accurate and there is documentation present in the patients medical record to support these statements.   Completed by Darrick Grinder, NP   In instances where this form was completed by an Advanced Practice Provider, please see EMR for physician Co-Signature.  [facility] to provide  Labs every other week to include BMET, Mg, and CBC with Diff. Additional as needed. Should be drawn via PERIPHERAL stick. NOT PICC line.   [id number] Milrinone 0.375 mcg mcg/kg/min X 52 weeks  A4221 Supplies for maintenance of drug infusion catheter A4222 Supplies for the external drug infusion per cassette or bag E0781 Ambulatory Infusion pump  Question Answer Comment  Heart Failure Follow-up Care [facility] [place] Visits Set up telemonitoring equipment to monitor daily vital signs, weights and oxygen saturation   Obtain the following labs Basic Metabolic Panel   Lab frequency Weekly   Fax lab results to [facility] at [phone]   Diet Low Sodium Heart Healthy   Fluid restrictions: 2000 mL Fluid      12/01/20 1022         Allergies  Allergen Reactions  . Other Anaphylaxis    Insect bites (Has EpiPen)    The results of significant diagnostics from this hospitalization (including imaging, microbiology, ancillary and laboratory) are listed below for  reference.    Significant Diagnostic Studies: DG Chest 2 View  Result Date: 11/17/2020  CLINICAL DATA:  Shortness of breath over the last day. History of congestive heart failure. EXAM: CHEST - 2 VIEW COMPARISON:  10/23/2020 FINDINGS: Previous median sternotomy and CABG. Pacemaker/AICD in place. The heart is chronically enlarged. Chronic aortic atherosclerosis. There is pulmonary venous hypertension but no frank edema. Small effusions in the posterior costophrenic angles. No focal finding otherwise. IMPRESSION: Congestive heart failure. Cardiomegaly. Pulmonary venous hypertension. Small effusions in the posterior costophrenic angles. Electronically Signed   By: Nelson Chimes M.D.   On: 11/17/2020 17:06   CT HEAD WO CONTRAST  Result Date: 11/24/2020 CLINICAL DATA:  Memory loss. EXAM: CT HEAD WITHOUT CONTRAST TECHNIQUE: Contiguous axial images were obtained from the base of the skull through the vertex without intravenous contrast. COMPARISON:  Report from remote head and sinus CT 2001. FINDINGS: Brain: Generalized cerebral atrophy. Minor periventricular and deep white matter hypodensity typical of chronic small vessel ischemia, typical for age. No acute or prior infarct. No hemorrhage. No hydrocephalus. The basilar cisterns are patent. Gray-white differentiation is preserved. Vascular: Atherosclerosis of skullbase vasculature without hyperdense vessel or abnormal calcification. Skull: No fracture or focal lesion. Sinuses/Orbits: Opacification of right maxillary sinus with heterogeneous density and air-fluid level. There is circumferential mucosal thickening. Mild cortical thickening. Scattered opacification of ethmoid air cells and right frontal sinus. The mastoid air cells are clear. Orbits are unremarkable. Other: None. IMPRESSION: 1. Generalized cerebral atrophy and mild chronic small vessel ischemia. No acute intracranial abnormality. 2. Chronic right maxillary sinusitis with heterogeneous contents.  Electronically Signed   By: Keith Rake M.D.   On: 11/24/2020 17:08   CT CHEST WO CONTRAST  Result Date: 11/26/2020 CLINICAL DATA:  Aortic disease. Preoperative left ventricular assist device implant EXAM: CT CHEST WITHOUT CONTRAST TECHNIQUE: Multidetector CT imaging of the chest was performed following the standard protocol without IV contrast. COMPARISON:  November 23, 2020 FINDINGS: Cardiovascular: There is no appreciable thoracic aortic aneurysm. No evident intramural hematoma. There are foci of aortic atherosclerosis. There are scattered foci of calcification in proximal visualized great vessels. There are multiple foci of coronary artery calcification. Pacemaker lead attached to the right ventricle. There is stable left atrial and left ventricular prominence, unchanged from recent study. There is no pericardial effusion or pericardial thickening. Mediastinum/Nodes: Thyroid appears unremarkable. No demonstrable adenopathy. No appreciable esophageal lesions. Lungs/Pleura: Small pleural effusions bilaterally, essentially stable compared to recent study. There is atelectatic change in the right base. There is no edema or airspace opacity. Mild scarring noted in the left upper lobe anteriorly. No pneumothorax. Trachea and major bronchial structures appear unremarkable. Upper Abdomen: There is cholelithiasis. Gallbladder wall does not appear thickened. There is upper abdominal aortic atherosclerosis. Visualized upper abdominal structures otherwise appear normal. Musculoskeletal: Status post previous median sternotomy. Degenerative change noted throughout the thoracic spine. No blastic or lytic bone lesions. Pacemaker device present anteriorly on the left. IMPRESSION: 1. Status post median sternotomy with pacemaker lead attached to the right ventricle, unchanged. Stable left-sided cardiac prominence. No pericardial effusion. There are foci of aortic atherosclerosis as well as foci of great vessel and  coronary artery calcification. No thoracic aortic aneurysm. 2. Stable small pleural effusions bilaterally. No edema or airspace opacity. Right base atelectasis noted. 3.  No evident adenopathy. 4.  Cholelithiasis. Aortic Atherosclerosis (ICD10-I70.0). Electronically Signed   By: Lowella Grip III M.D.   On: 11/26/2020 08:49   CARDIAC CATHETERIZATION  Result Date: 11/23/2020 Findings: On milrinone 0.375 RA = 6 RV = 56/10 PA = 51/22 (33) PCW = 19  Thermal cardiac output/index = 5.5/2.7 Fick CO/CI = 6.2/3.0 PVR = 2.3 WU Ao sat = 94% PA sat = 59%, 61% PAPi = 4.8 Assessment: 1. Mild pulmonary venous HTN 2. Volume status improved 3. Cardiac output ok on milrinone 4. PAPI acceptable Plan/Discussion: Proceed with LVAD work-up. Glori Bickers, MD 8:14 AM  DG CHEST PORT 1 VIEW  Result Date: 12/02/2020 CLINICAL DATA:  Dyspnea EXAM: PORTABLE CHEST 1 VIEW COMPARISON:  11/17/2020, 11/26/2020 FINDINGS: Right-sided PICC line terminates at the level of the superior cavoatrial junction. Single lead left-sided implanted cardiac device. Post CABG changes. Stable cardiomegaly. Previously noted small pleural effusions are not evident on frontal view. No new focal airspace consolidation. No pneumothorax. IMPRESSION: 1. Stable exam.  No new acute cardiopulmonary findings. 2. Previously noted small pleural effusions are not evident on frontal view. Electronically Signed   By: Davina Poke D.O.   On: 12/02/2020 14:34   VAS US DOPPLER PRE VAD  Result Date: 11/24/2020 PERIOPERATIVE VASCULAR EVALUATION Indications:            Pre-VAD workup. Risk Factors:           Hypertension, hyperlipidemia, coronary artery disease. Vascular Interventions: Hx of CABG. Performing Technologist: Darlin Coco RDMS  Examination Guidelines: A complete evaluation includes B-mode imaging, spectral Doppler, color Doppler, and power Doppler as needed of all accessible portions of each vessel. Bilateral testing is considered an integral part of a  complete examination. Limited examinations for reoccurring indications may be performed as noted.  Right Carotid Findings: +----------+--------+--------+--------+---------------------------+--------+           PSV cm/sEDV cm/sStenosisDescribe                   Comments +----------+--------+--------+--------+---------------------------+--------+ CCA Prox  87      21                                                  +----------+--------+--------+--------+---------------------------+--------+ CCA Distal73      22              heterogenous                        +----------+--------+--------+--------+---------------------------+--------+ ICA Prox  61      17      1-39%   heterogenous and hypoechoic         +----------+--------+--------+--------+---------------------------+--------+ ICA Distal58      22                                                  +----------+--------+--------+--------+---------------------------+--------+ ECA       103     11                                                  +----------+--------+--------+--------+---------------------------+--------+ Portions of this table do not appear on this page. +----------+--------+-------+----------------+------------+           PSV cm/sEDV cmsDescribe        Arm Pressure +----------+--------+-------+----------------+------------+ Subclavian108            Multiphasic, WNL             +----------+--------+-------+----------------+------------+ +---------+--------+--+--------+--+---------+  VertebralPSV cm/s34EDV cm/s12Antegrade +---------+--------+--+--------+--+---------+ Left Carotid Findings: +----------+--------+--------+--------+-------------------+--------------------+           PSV cm/sEDV cm/sStenosisDescribe           Comments             +----------+--------+--------+--------+-------------------+--------------------+ CCA Prox  106     27              hyperechoic and                                                            heterogenous                            +----------+--------+--------+--------+-------------------+--------------------+ CCA Mid   159     40              heterogenous and                                                          hypoechoic                              +----------+--------+--------+--------+-------------------+--------------------+ CCA Distal182     34              heterogenous                            +----------+--------+--------+--------+-------------------+--------------------+ ICA Prox  302     102     80-99%  heterogenous and   Velocities may                                         hypoechoic         underestimate degree                                                      of stenosis due to                                                        more proximal                                                             obstruction.         +----------+--------+--------+--------+-------------------+--------------------+ ICA Mid   281     113     80-99%  heterogenous                            +----------+--------+--------+--------+-------------------+--------------------+  ICA Distal137     40                                                      +----------+--------+--------+--------+-------------------+--------------------+ ECA       87      11                                                      +----------+--------+--------+--------+-------------------+--------------------+ +----------+--------+--------+----------------+------------+ SubclavianPSV cm/sEDV cm/sDescribe        Arm Pressure +----------+--------+--------+----------------+------------+           152             Multiphasic, [id number]          +----------+--------+--------+----------------+------------+ +---------+--------+--+--------+--+---------+ VertebralPSV cm/s36EDV cm/s14Antegrade  +---------+--------+--+--------+--+---------+  ABI Findings: +--------+------------------+-----+---------+----------------------------------+ Right   Rt Pressure (mmHg)IndexWaveform Comment                            +--------+------------------+-----+---------+----------------------------------+ Brachial                       triphasicUnable to obtain pressure due to                                           bandaging/IV                       +--------+------------------+-----+---------+----------------------------------+ PTA     152               1.21 triphasic                                   +--------+------------------+-----+---------+----------------------------------+ DP      156               1.24 triphasic                                   +--------+------------------+-----+---------+----------------------------------+ +--------+------------------+-----+---------+-------+ Left    Lt Pressure (mmHg)IndexWaveform Comment +--------+------------------+-----+---------+-------+ [id number]                                     +--------+------------------+-----+---------+-------+ PTA     157               1.25 triphasic        +--------+------------------+-----+---------+-------+ DP      158               1.25 triphasic        +--------+------------------+-----+---------+-------+  Summary: Right Carotid: Velocities in the right ICA are consistent with a 1-39% stenosis. Left Carotid: Velocities in the left ICA are consistent with a 80-99% stenosis. Vertebrals:  Bilateral vertebral arteries demonstrate antegrade flow. Subclavians: Normal flow hemodynamics were seen in bilateral subclavian  arteries.  *See table(s) above for measurements and observations. Right ABI: Resting right ankle-brachial index is within normal range. No evidence of significant right lower extremity arterial disease. Left ABI: Resting left ankle-brachial index is within normal  range. No evidence of significant left lower extremity arterial disease.  Electronically signed by Servando Snare MD on 11/24/2020 at 2:19:14 PM.    Final    VAS [place] LOWER EXTREMITY VENOUS (DVT)  Result Date: 11/24/2020  Lower Venous DVT Study Indications: Pre-VAD workup.  Anticoagulation: Heparin. Comparison Study: No prior studies. Performing Technologist: Darlin Coco RDMS  Examination Guidelines: A complete evaluation includes B-mode imaging, spectral Doppler, color Doppler, and power Doppler as needed of all accessible portions of each vessel. Bilateral testing is considered an integral part of a complete examination. Limited examinations for reoccurring indications may be performed as noted. The reflux portion of the exam is performed with the patient in reverse Trendelenburg.  +---------+---------------+---------+-----------+----------+--------------+ RIGHT    CompressibilityPhasicitySpontaneityPropertiesThrombus Aging +---------+---------------+---------+-----------+----------+--------------+ CFV      Full           Yes      Yes                                 +---------+---------------+---------+-----------+----------+--------------+ SFJ      Full                                                        +---------+---------------+---------+-----------+----------+--------------+ FV Prox  Full                                                        +---------+---------------+---------+-----------+----------+--------------+ FV Mid   Full                                                        +---------+---------------+---------+-----------+----------+--------------+ FV DistalFull                                                        +---------+---------------+---------+-----------+----------+--------------+ PFV      Full                                                        +---------+---------------+---------+-----------+----------+--------------+ POP       Full           Yes      Yes                                 +---------+---------------+---------+-----------+----------+--------------+ PTV      Full                                                        +---------+---------------+---------+-----------+----------+--------------+  PERO     Full                                                        +---------+---------------+---------+-----------+----------+--------------+   +---------+---------------+---------+-----------+----------+--------------+ LEFT     CompressibilityPhasicitySpontaneityPropertiesThrombus Aging +---------+---------------+---------+-----------+----------+--------------+ CFV      Full           Yes      Yes                                 +---------+---------------+---------+-----------+----------+--------------+ SFJ      Full                                                        +---------+---------------+---------+-----------+----------+--------------+ FV Prox  Full                                                        +---------+---------------+---------+-----------+----------+--------------+ FV Mid   Full                                                        +---------+---------------+---------+-----------+----------+--------------+ FV DistalFull                                                        +---------+---------------+---------+-----------+----------+--------------+ PFV      Full                                                        +---------+---------------+---------+-----------+----------+--------------+ POP      Full           Yes      Yes                                 +---------+---------------+---------+-----------+----------+--------------+ PTV      Full                                                        +---------+---------------+---------+-----------+----------+--------------+ PERO     Full                                                         +---------+---------------+---------+-----------+----------+--------------+  Summary: RIGHT: - There is no evidence of deep vein thrombosis in the lower extremity.  - A cystic structure is found in the popliteal fossa.  LEFT: - There is no evidence of deep vein thrombosis in the lower extremity.  - No cystic structure found in the popliteal fossa.  *See table(s) above for measurements and observations. Electronically signed by Servando Snare MD on 11/24/2020 at 2:19:44 PM.    Final    ECHOCARDIOGRAM LIMITED  Result Date: 11/22/2020    ECHOCARDIOGRAM LIMITED REPORT   Patient Name:   JOJI KIRCHGESSNER Date of Exam: 11/22/2020 Medical Rec #:  [id number]          Height:       72.0 in Accession #:    [id number]         Weight:       180.8 lb Date of Birth:  [birth year]         BSA:          2.041 m Patient Age:    44 years           BP:           103/67 mmHg Patient Gender: M                  HR:           59 bpm. Exam Location:  Inpatient Procedure: Limited Echo, Cardiac Doppler, Color Doppler and Intracardiac            Opacification Agent REPORT CONTAINS CRITICAL RESULT Indications:    CHF-Acute Systolic  History:        Patient has prior history of Echocardiogram examinations, most                 recent 10/24/2020. CAD and Previous Myocardial Infarction, Prior                 CABG and Defibrillator; Risk Factors:Dyslipidemia. ETOH abuse.  Sonographer:    Clayton Lefort RDCS (AE) Referring Phys: [id number] [place]  1. Large laminated mural apical thrombus. Left ventricular ejection fraction, by estimation, is <20%. The left ventricle has severely decreased function. The left ventricle demonstrates severe global hypokinesis with akinesis of the inferior, inferolateral, inferoseptal and apical walls. The left ventricular internal cavity size was severely dilated.  2. Left atrial size was severely dilated.  3. The posterior MV leaflet is tethered and restricted. . The mitral  valve is abnormal. Severe mitral valve regurgitation.  4. The aortic valve is tricuspid. There is mild calcification of the aortic valve. Aortic valve regurgitation is mild.  5. Right ventricular systolic function is moderately reduced. FINDINGS  Left Ventricle: Large laminated mural apical thrombus. Left ventricular ejection fraction, by estimation, is <20%. The left ventricle has severely decreased function. The left ventricle demonstrates global hypokinesis. Definity contrast agent was given IV to delineate the left ventricular endocardial borders. The left ventricular internal cavity size was severely dilated. There is no left ventricular hypertrophy. Right Ventricle: Right ventricular systolic function is moderately reduced. Left Atrium: Left atrial size was severely dilated. Pericardium: There is no evidence of pericardial effusion. Mitral Valve: The posterior MV leaflet is tethered and restricted. The mitral valve is abnormal. There is moderate thickening of the mitral valve leaflet(s). Severe mitral valve regurgitation, with posteriorly-directed jet. Tricuspid Valve: The tricuspid valve is grossly normal. Aortic Valve: The aortic valve is tricuspid. There is mild calcification of the aortic valve. Aortic valve regurgitation is mild.  Pulmonic Valve: The pulmonic valve was not assessed. Additional Comments: A pacer wire is visualized. LEFT VENTRICLE PLAX 2D LVIDd:         6.10 cm LVIDs:         5.40 cm LV PW:         1.10 cm LV IVS:        0.90 cm  LEFT ATRIUM         Index LA diam:    4.60 cm 2.25 cm/m  MR Peak grad:    121.9 mmHg MR Mean grad:    68.0 mmHg MR Vmax:         552.00 cm/s MR Vmean:        378.0 cm/s MR PISA:         3.08 cm MR PISA Eff ROA: 21 mm MR PISA Radius:  0.70 cm Glori Bickers MD Electronically signed by Glori Bickers MD Signature Date/Time: 11/22/2020/8:31:45 PM    Final    [place] EKG SITE RITE  Result Date: 11/21/2020 If Site Rite image not attached, placement could not be  confirmed due to current cardiac rhythm.  CT CHEST ABDOMEN PELVIS WO CONTRAST  Result Date: 11/23/2020 CLINICAL DATA:  Presurgical evaluation for LVAD EXAM: CT CHEST, ABDOMEN AND PELVIS WITHOUT CONTRAST TECHNIQUE: Multidetector CT imaging of the chest, abdomen and pelvis was performed following the standard protocol without IV contrast. COMPARISON:  CT angio chest, abdomen, pelvis 12/21/2015. FINDINGS: Lines and tubes: Right PICC with tip terminating at the superior cavoatrial junction. Two lead left chest wall cardiac pacemaker/defibrillator with leads terminating in the event right ventricle and right atria. CT CHEST FINDINGS Cardiovascular: Surgical changes related to a coronary artery bypass graft. Left atrial and ventricular enlargement. No significant pericardial effusion. The thoracic aorta is normal in caliber. No atherosclerotic plaque of the thoracic aorta. At least mild to moderate three-vessel coronary artery calcifications. Coronary artery calcifications. Mediastinum/Nodes: No enlarged mediastinal, hilar, or axillary lymph nodes. Thyroid gland, trachea, and esophagus demonstrate no significant findings. At least small volume hiatal hernia. Lungs/Pleura: No focal consolidation. Left apical atelectasis versus scarring again noted. No pulmonary nodule or mass. Bilateral trace, left greater than right, pleural effusions. No pneumothorax. Musculoskeletal: No chest wall abnormality. No suspicious lytic or blastic osseous lesions. No acute displaced fracture. Multilevel degenerative changes of the spine. Status post right shoulder rotator cuff anchor suture repair. CT ABDOMEN PELVIS FINDINGS Hepatobiliary: No focal liver abnormality is seen. Layering hyperdensity within the gallbladder lumen consistent with gallstones. No associated gallbladder wall thickening or pericholecystic fluid. No biliary dilatation. Pancreas: No focal lesion. Normal pancreatic contour. No surrounding inflammatory changes. No main  pancreatic ductal dilatation. Spleen: Normal in size without focal abnormality. A splenule is identified. Adrenals/Urinary Tract: No adrenal nodule bilaterally. Nonspecific bilateral perinephric stranding. No nephrolithiasis, no hydronephrosis, and no contour-deforming renal mass. No ureterolithiasis or hydroureter. The urinary bladder is unremarkable. Stomach/Bowel: Stomach is within normal limits. No evidence of bowel wall thickening or dilatation. Suggestion of a second portion of the duodenum diverticula. Diffuse sigmoid diverticulosis. Mobile cecum. The appendix not visualized. Vascular/Lymphatic: No abdominal aorta or iliac aneurysm. At least moderate atherosclerotic plaque of the aorta and its branches. No abdominal, pelvic, or inguinal lymphadenopathy. Reproductive: Metallic density surrounding the prostate likely represent radiation therapy seeds. Otherwise the prostate is unremarkable. Other: Nonspecific mild presacral stranding that is likely related to radiation treatment of the prostate. No intraperitoneal free fluid. No intraperitoneal free gas. No organized fluid collection. Musculoskeletal: Anterior abdominal wall subcutaneus soft  tissue densities and couple foci of gas likely related to medicine injections. Tiny fat containing umbilical hernia. No suspicious lytic or blastic osseous lesions. No acute displaced fracture. Multilevel degenerative changes of the spine. IMPRESSION: 1. Please note limited evaluation on this noncontrast study. 2. Bilateral trace pleural effusions. 3. Cholelithiasis with no CT findings to suggest acute cholecystitis or choledocholithiasis. 4. Diffuse sigmoid diverticulosis with no acute diverticulitis. 5. At least small volume hiatal hernia. 6. Anterior abdominal wall subcutaneus soft tissue densities and couple foci of gas likely related to medicine injections. Please correlate with physical exam and history. 7. 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 8. Aortic Atherosclerosis (ICD10-I70.0) as well as at least mild to moderate three-vessel coronary artery calcifications. Electronically Signed   By: Iven Finn M.D.   On: 11/23/2020 18:14    Microbiology: No results found for this or any previous visit (from the past 240 hour(s)).   Labs: Basic Metabolic Panel: Recent Labs  Lab 11/29/20 0317 11/30/20 0523 12/01/20 0535 12/02/20 0226  NA 140 139 137 138  K 4.3 3.6 3.8 4.0  CL 106 104 105 104  CO2 25 23 23 24   GLUCOSE 102* 97 101* 104*  BUN 14 16 16 13   CREATININE 1.43* 1.37* 1.31* 1.35*  CALCIUM 8.8* 8.7* 8.7* 8.9   Liver Function Tests: No results for input(s): AST, ALT, ALKPHOS, BILITOT, PROT, ALBUMIN in the last 168 hours. No results for input(s): LIPASE, AMYLASE in the last 168 hours. No results for input(s): AMMONIA in the last 168 hours. CBC: Recent Labs  Lab 11/29/20 0317 11/30/20 0523 12/01/20 0535 12/02/20 0226  WBC 6.2 6.0 6.7 7.7  HGB 9.7* 9.1* 8.9* 9.5*  HCT 30.6* 30.2* 29.9* 31.7*  MCV 84.5 85.6 85.7 85.9  PLT 253 235 232 252   Cardiac Enzymes: No results for input(s): CKTOTAL, CKMB, CKMBINDEX, TROPONINI in the last 168 hours. BNP: BNP (last 3 results) Recent Labs    11/17/20 1645  BNP 3,663.4*    ProBNP (last 3 results) No results for input(s): PROBNP in the last 8760 hours.  CBG: No results for input(s): GLUCAP in the last 168 hours.  Principal Problem:   VT (ventricular tachycardia) (HCC) Active Problems:   Hyperlipidemia with target LDL less than 70   Carotid artery disease (HCC)   Acute on chronic combined systolic and diastolic CHF (congestive heart failure) (HCC)   CHF exacerbation (HCC)   Acute on chronic kidney failure ([place])   Time coordinating discharge: 38 minutes  Signed:        Chaela Branscum, DO [facility]  12/05/2020, 1:12 PM

## 2020-12-05 NOTE — Progress Notes (Signed)
Manufacturing engineer [facility])  Discussed with [facility] regarding nebulized albuterol.  Ordered nebulizer from [facility], it may not be delivered until Sunday.    Does not appear that patient has required this in the hospital and would not delay discharge to wait for this.  ACC will plan to see [name] in the home tomorrow.  Venia Carbon RN, BSN, [facility] Liaison

## 2020-12-05 NOTE — TOC Transition Note (Addendum)
Transition of Care [facility]) - CM/SW Discharge Note   Patient Details  Name: [name] MRN: [id number] Date of Birth: 1946-08-15  Transition of Care [facility]) CM/SW Contact:  Carles Collet, RN Phone Number: 12/05/2020, 10:39 AM   Clinical Narrative:    [name] w patient's wife, she confirms all equipment has been delivered to the house. She confirms need for PTAR, and address. She is requesting he be home by 1pm, currently no DC order. Message sent to attending, Swayze. PTAR forms on chart, will call PTAR when order written.   12:00 CM reviewed DC meds and noticed that patient has prn albuterol via nebulizer. Wife states they do not have a nebulizer. Nurse states he has not needed any. Requested MD to write for albuterol inhaler if patient will need it prior to tomorrow, as [facility] has ordered nebulizer and home delivery cannot be guaranteed until tomorrow.   Wife and nurse notified that [name] has been called.     Final next level of care: Home w Hospice Care Barriers to Discharge: Continued Medical Work up   Patient Goals and CMS Choice Patient states their goals for this hospitalization and ongoing recovery are:: home with hospice CMS Medicare.gov Compare Post Acute Care list provided to:: Patient Choice offered to / list presented to : Patient  Discharge Placement                       Discharge Plan and Services   Discharge Planning Services: CM Consult Post Acute Care Choice: Hospice          DME Arranged:  ([facility] will supply the DME)         HH Arranged: RN [facility] Agency: Hospice and [place] Date [place]: 12/04/20 Time [place]: 68 Representative spoke with at [place]: [name] Interventions     Readmission Risk Interventions Readmission Risk Prevention Plan 12/04/2020  Transportation Screening Complete  PCP or Specialist Appt within 5-7 Days Complete  Home Care  Screening Complete  Medication Review (RN CM) Complete  Some recent data might be hidden

## 2020-12-05 NOTE — Plan of Care (Addendum)
  Problem: Education: Goal: Knowledge of General Education information will improve Description: Including pain rating scale, medication(s)/side effects and non-pharmacologic comfort measures Outcome: Completed/Met   Problem: Health Behavior/Discharge Planning: Goal: Ability to manage health-related needs will improve Outcome: Completed/Met   Problem: Clinical Measurements: Goal: Ability to maintain clinical measurements within normal limits will improve Outcome: Completed/Met Goal: Will remain free from infection Outcome: Completed/Met Goal: Diagnostic test results will improve Outcome: Completed/Met Goal: Respiratory complications will improve Outcome: Completed/Met Goal: Cardiovascular complication will be avoided Outcome: Completed/Met   Problem: Activity: Goal: Risk for activity intolerance will decrease Outcome: Completed/Met   Problem: Coping: Goal: Level of anxiety will decrease Outcome: Completed/Met   Problem: Elimination: Goal: Will not experience complications related to bowel motility Outcome: Completed/Met Goal: Will not experience complications related to urinary retention Outcome: Completed/Met   Problem: Safety: Goal: Ability to remain free from injury will improve Outcome: Completed/Met   Problem: Education: Goal: Understanding of CV disease, CV risk reduction, and recovery process will improve Outcome: Completed/Met Goal: Individualized Educational Video(s) Outcome: Completed/Met   Problem: Activity: Goal: Ability to return to baseline activity level will improve Outcome: Completed/Met   Problem: Cardiovascular: Goal: Ability to achieve and maintain adequate cardiovascular perfusion will improve Outcome: Completed/Met Goal: Vascular access site(s) Level 0-1 will be maintained Outcome: Completed/Met   Problem: Health Behavior/Discharge Planning: Goal: Ability to safely manage health-related needs after discharge will improve Outcome:  Completed/Met   Problem: Increased Nutrient Needs (NI-5.1) Goal: Food and/or nutrient delivery Description: Individualized approach for food/nutrient provision. Outcome: Completed/Met   Problem: Acute Rehab PT Goals(only PT should resolve) Goal: Patient Will Transfer Sit To/From Stand Outcome: Completed/Met Goal: Pt Will Ambulate Outcome: Completed/Met Goal: Pt Will Go Up/Down Stairs Outcome: Completed/Met   Problem: Acute Rehab OT Goals (only OT should resolve) Goal: Pt. Will Perform Grooming Outcome: Completed/Met Goal: Pt. Will Perform Upper Body Dressing Outcome: Completed/Met Goal: Pt. Will Perform Lower Body Dressing Outcome: Completed/Met Goal: Pt. Will Transfer To Toilet Outcome: Completed/Met   Problem: Education: Goal: Ability to demonstrate management of disease process will improve Outcome: Completed/Met Goal: Ability to verbalize understanding of medication therapies will improve Outcome: Completed/Met   Problem: Activity: Goal: Capacity to carry out activities will improve Outcome: Completed/Met   Problem: Cardiac: Goal: Ability to achieve and maintain adequate cardiopulmonary perfusion will improve Outcome: Completed/Met Discharge instructions reviewed with patient and with patient's wife over the phone.  These included, but were not limited to, the following:  Management of pain and anxiety (with an ongoing assessment and potential changes being made by Hospice), the purpose of Hospice Care, when to call the MD, rights as a patient and family member, medications, follow-ups with MD / Hospice at Home, etc.  Transportation to be provided by PTAR.  Wife has made a request that the anti-anxiety agent that was ordered to be administered rectally be changed to Ativan sl as the latter is much more affordable for her.  This was conveyed to the MD.

## 2020-12-05 NOTE — Discharge Summary (Signed)
Physician Discharge Summary  [name] EUM:[id number] DOB: 12-May-1946 DOA: 11/17/2020  PCP: Hoyt Koch, MD  Admit date: 11/17/2020 Discharge date: 12/05/2020  Recommendations for Outpatient Follow-up:  1. Discharge to home with hospice for end of life care.   Follow-up Information    [facility] Follow up.   Why: home hospice Contact information: [place] [phone]             Discharge Diagnoses: Principal diagnosis is #1 1. Acute on chronic combined systolic and diastolic CHF. EF 20-25% 2. Monomorphic Ventricular tachycardia  3. LV thrombus 4. Carotid stenosis 5. AKI on CKD IIIa 6. Severe Mitral regurgitation 7. CAD s/p CABG and PCI 8. Hypokalemia 9. Hypomagnesemia 10. Hypertension 11. Hyperlipidemia 12. GERD 13. Depression/anxiety 14. Hypoalbuminemia  Discharge Condition: Poor  Disposition: Home with hospice  Diet recommendation: As tolerated  Filed Weights   12/02/20 0034 12/03/20 0504 12/05/20 0451  Weight: 77.6 kg 74.6 kg 75.2 kg    History of present illness: [name] is a 75 y.o. male with medical history significant of CAD status post CABG and PCI, chronic combined systolic and diastolic CHF/ischemic cardiomyopathy with LVEF 20-25% and history of VT status post ICD, hypertension, hyperlipidemia, CKD stage IIIa, history of prostate and tonsillar cancer, history of DVT presenting to the ED with complaints of shortness of breath.  Patient reports dyspnea on exertion and cough for the past 3 to 4 days.  He was having low-grade fevers earlier but has not had them for the past few days.  He has also had 3 episodes of vomiting over the past week which has now resolved.  Denies abdominal pain or diarrhea.  States his ICD had shocked him prior to his recent hospitalization but he has not had any issues lately.  He is vaccinated against Covid.  States he has been checked for Covid  multiple times recently and every time results came back negative.  ED Course: Afebrile.  Tachypneic with respiratory rate in the 20s.  Not tachycardic.  Not hypoxic.  WBC 7.0, hemoglobin 10.3, hematocrit 33.9, platelet 186K.  Sodium 140, potassium 3.7, chloride 107, bicarb 22, BUN 27, creatinine 1.7, glucose 106.  High-sensitivity troponin mildly elevated but stable (29 >29).  BNP significantly elevated at 3663.  SARS-CoV-2 PCR test and influenza panel both negative.  Chest x-ray showing cardiomegaly and small effusions in the posterior costophrenic angles; no frank edema.  Hospital Course: Peter Sternberg Morrisonis a 75 y.o.malewith medical history significant ofCAD status post CABG and PCI, chronic combined systolic and diastolic CHF/ischemic cardiomyopathy with LVEF 20-25% and history of VT status post ICD, hypertension, hyperlipidemia,CKD stage IIIa,history of prostate and tonsillar cancer, history of DVT presenting to the ED with complaints of shortness of breath.Patient reports dyspnea on exertion and cough for the past 3 to 4 days. He was having low-grade fevers earlier but has not had them for the past few days. He has also had 3 episodes of vomiting over the past week which has now resolved. Denies abdominal pain or diarrhea. States his ICD had shocked him prior to his recent hospitalization but he has not had any issues lately. He is vaccinated against Covid.   ED Course:Afebrile.Tachypneic with respiratory rate in the 20s.Not tachycardic.Not hypoxic. WBC 7.0, hemoglobin 10.3, hematocrit 33.9, platelet 186K.Sodium 140, potassium 3.7, chloride 107, bicarb 22, BUN 27, creatinine 1.7, glucose 106. High-sensitivity troponin mildly elevated but stable (29 >29).BNP significantly elevated at 3663. SARS-CoV-2 PCR test and influenza  panel both negative.  Chest x-ray showing cardiomegaly and small effusions in the posterior costophrenic angles; no frank edema.Patient was given  IV Lasix 20 mg.  Vascular surgery was consulted due to the patient's carotid artery stenosis, Left ICA stenosis >80%. The patient was evaluated by Dr. Donzetta Matters on 11/24/2020. He determined that if the patient were to meet criteria for LVAD placement, it would make sense to perform CEA during the same procedure. However, if the patient were not found to be a candidate for the LVAD procedure, then he would not be seen as a candidate for CEA.  Unfortunately due to the patient's memory impairment/ poor functional status, and safety issues he is deemed not appropriate for LVAD. Cardiology has recommended that the patient be discharged to SNF on milrinone for palliation.   The patient fell 0n 12/02/2020. No injuries noted by patient/nursing.  Palliative care has been consulted to assist the patient and his wife in making decisions about the next phase of his care. He is now total comfort care. Milrinone and Heparin have been discontinued. The patient and family have decided to go home with hospice. Hospital bed, bedside commode, and over the bed table will be deliver prior to discharge. Hospice order in place.  Today's assessment: S: The patient is resting comfortably. No new complaints. O: Vitals:  Vitals:   12/05/20 0451 12/05/20 1144  BP: 118/78 95/67  Pulse: 67 69  Resp: 20 20  Temp: 97.6 F (36.4 C)   SpO2: 98% 100%    Exam:  Constitutional:   The patient is sleeping. He is not awakened. No acute distress. Respiratory:   No increased work of breathing.  No wheezes, rales, or rhonchi  No tactile fremitus Cardiovascular:   Regular rate and rhythm  No murmurs, ectopy, or gallups.  No lateral PMI. No thrills. Abdomen:   Abdomen is soft, non-tender, non-distended  No hernias, masses, or organomegaly  Normoactive bowel sounds.  Musculoskeletal:   No cyanosis, clubbing, or edema Skin:   No rashes, lesions, ulcers  palpation of skin: no induration or  nodules Neurologic:   CN 2-12 intact  Sensation all 4 extremities intact Psychiatric:   Mental status ? Mood, affect congruent ? Orientation to person, place, time   judgment and insight appear intact    Discharge Instructions  Discharge Instructions    Call MD for:  difficulty breathing, headache or visual disturbances   Complete by: As directed    Call MD for:  severe uncontrolled pain   Complete by: As directed    Diet - low sodium heart healthy   Complete by: As directed    Discharge instructions   Complete by: As directed    Discharge to home with hospice for end of life care.     Allergies as of 12/05/2020      Reactions   Other Anaphylaxis   Insect bites (Has EpiPen)      Medication List    STOP taking these medications   atorvastatin 80 MG tablet Commonly known as: LIPITOR   carvedilol 12.5 MG tablet Commonly known as: COREG   doxycycline 100 MG tablet Commonly known as: VIBRA-TABS   EPINEPHrine 0.3 mg/0.3 mL Soaj injection Commonly known as: EPI-PEN   HYDROcodone-homatropine 5-1.5 MG/5ML syrup Commonly known as: HYCODAN   sacubitril-valsartan 24-26 MG Commonly known as: ENTRESTO     TAKE these medications   amiodarone 400 MG tablet Commonly known as: PACERONE 400 mg twice a day for 2 weeks, then 400 mg  daily for 2 weeks, then 200mg  daily thereafter What changed:   how much to take  how to take this  when to take this  additional instructions   aspirin 81 MG EC tablet Take 81 mg by mouth daily.   buPROPion 300 MG 24 hr tablet Commonly known as: WELLBUTRIN XL Take 300 mg by mouth daily.   escitalopram 20 MG tablet Commonly known as: LEXAPRO Take 20 mg by mouth daily.   esomeprazole 20 MG packet Commonly known as: NEXIUM Take 20 mg by mouth daily before breakfast.   feeding supplement Liqd Take 237 mLs by mouth 2 (two) times daily between meals.   hyoscyamine 0.125 MG SL tablet Commonly known as: LEVSIN SL Place 1 tablet  (0.125 mg total) under the tongue every 4 (four) hours as needed (excess oral secretions).   loperamide 2 MG capsule Commonly known as: IMODIUM Take 1 capsule (2 mg total) by mouth as needed for diarrhea or loose stools.   LORazepam 0.5 MG tablet Commonly known as: ATIVAN Take 1 tablet (0.5 mg total) by mouth every 4 (four) hours as needed for anxiety. May crush, mix with water and give sublingually if needed.   mexiletine 150 MG capsule Commonly known as: MEXITIL Take 1 capsule (150 mg total) by mouth every 8 (eight) hours.   morphine 20 MG/ML concentrated solution Commonly known as: ROXANOL Take 0.5 mLs (10 mg total) by mouth every 4 (four) hours as needed for severe pain or shortness of breath. May give sublingually if needed.   nitroGLYCERIN 0.4 MG SL tablet Commonly known as: NITROSTAT Place 1 tablet (0.4 mg total) under the tongue every 5 (five) minutes as needed for chest pain.   Ocuvite PreserVision Tabs Take 1 tablet by mouth daily.   ondansetron 4 MG tablet Commonly known as: ZOFRAN Take 1 tablet (4 mg total) by mouth every 8 (eight) hours as needed for nausea or vomiting.   prochlorperazine 10 MG tablet Commonly known as: COMPAZINE Take 1 tablet (10 mg total) by mouth every 6 (six) hours as needed for nausea or vomiting.   zolpidem 10 MG tablet Commonly known as: AMBIEN Take 1 tablet by mouth at bedtime.            Durable Medical Equipment  (From admission, onward)         Start     Ordered   12/04/20 1448  For home use only DME Bedside commode  Once       Question:  Patient needs a bedside commode to treat with the following condition  Answer:  Debility   12/04/20 1447   12/04/20 1448  For home use only DME Hospital bed  Once       Question Answer Comment  Length of Need Lifetime   The above medical condition requires: Patient requires the ability to reposition frequently   Head must be elevated greater than: 30 degrees   Bed type Semi-electric       12/04/20 1447   12/04/20 1448  For home use only DME Overbed table  Once        12/04/20 1447   12/01/20 1021  Heart failure home health orders  (Heart failure home health orders / Face to face)  Once       Comments: Heart Failure Follow-up Care:  Verify follow-up appointments per Patient Discharge Instructions. Confirm transportation arranged. Reconcile home medications with discharge medication list. Remove discontinued medications from use. Assist patient/caregiver to manage medications using pill box.  Reinforce low sodium food selection Assessments: Vital signs and oxygen saturation at each visit. Assess home environment for safety concerns, caregiver support and availability of low-sodium foods. Consult Education officer, museum, PT/OT, Dietitian, and CNA based on assessments. Perform comprehensive cardiopulmonary assessment. Notify MD for any change in condition or weight gain of 3 pounds in one day or 5 pounds in one week with symptoms. Daily Weights and Symptom Monitoring: Ensure patient has access to scales. Teach patient/caregiver to weigh daily before breakfast and after voiding using same scale and record.    Teach patient/caregiver to track weight and symptoms and when to notify Provider. Activity: Develop individualized activity plan with patient/caregiver.   Home Paraenteral Inotropic Therapy : Data Collection Form  Patients name: [name]   Date: 12/01/20  Information below may not be completed by the supplier nor anyone in a Financial relationship with the supplier. NYHA IV   1. Results of invasive hemodynamic monitoring  Cardiac Index Before Inotrope infusion:           1.5              On Inotrope infusion:            1.9              Drug and dose:   Milrinone 0.375 mcg.  2. Cardiac medications immediately prior to inotrope infusion (List name, dose, and frequency) None  3. Dose this represent maximum tolerated doses of these medications? Yes.   4.  Breathing status Prior to inotrope infusion: Dyspnea at rest  At time of discharge: Dyspnea on moderate exertion.   5. Initial home prescription Drug and Dose:   0.375 mcg for continuous infusion 24/hr day and 7 days/week  6. If continuous infusion is prescribed, have attempts to discontinue inotrope infusion in the hospital failed?   Yes.   7. If intermittent infusion is prescribed, have there been repeated hospitalizations for heart failure which Parenteral inotrope were required? Not applicable.   8. Is patient capable of going to the physician for outpatient evaluation? Yes.    9. Is routine electrocardiographic monitoring required in the Home?  No.   The above statements and any additional explanations included separately are true and accurate and there is documentation present in the patients medical record to support these statements.   Completed by Darrick Grinder, NP   In instances where this form was completed by an Advanced Practice Provider, please see EMR for physician Co-Signature.  [facility] to provide  Labs every other week to include BMET, Mg, and CBC with Diff. Additional as needed. Should be drawn via PERIPHERAL stick. NOT PICC line.   [id number] Milrinone 0.375 mcg mcg/kg/min X 52 weeks  A4221 Supplies for maintenance of drug infusion catheter A4222 Supplies for the external drug infusion per cassette or bag E0781 Ambulatory Infusion pump  Question Answer Comment  Heart Failure Follow-up Care [facility] Visits Set up telemonitoring equipment to monitor daily vital signs, weights and oxygen saturation   Obtain the following labs Basic Metabolic Panel   Lab frequency Weekly   Fax lab results to [facility] at [phone]   Diet Low Sodium Heart Healthy   Fluid restrictions: 2000 mL Fluid      12/01/20 1022         Allergies  Allergen Reactions  . Other Anaphylaxis    Insect bites (Has EpiPen)    The results of  significant diagnostics  from this hospitalization (including imaging, microbiology, ancillary and laboratory) are listed below for reference.    Significant Diagnostic Studies: DG Chest 2 View  Result Date: 11/17/2020 CLINICAL DATA:  Shortness of breath over the last day. History of congestive heart failure. EXAM: CHEST - 2 VIEW COMPARISON:  10/23/2020 FINDINGS: Previous median sternotomy and CABG. Pacemaker/AICD in place. The heart is chronically enlarged. Chronic aortic atherosclerosis. There is pulmonary venous hypertension but no frank edema. Small effusions in the posterior costophrenic angles. No focal finding otherwise. IMPRESSION: Congestive heart failure. Cardiomegaly. Pulmonary venous hypertension. Small effusions in the posterior costophrenic angles. Electronically Signed   By: Paulina Fusi M.D.   On: 11/17/2020 17:06   CT HEAD WO CONTRAST  Result Date: 11/24/2020 CLINICAL DATA:  Memory loss. EXAM: CT HEAD WITHOUT CONTRAST TECHNIQUE: Contiguous axial images were obtained from the base of the skull through the vertex without intravenous contrast. COMPARISON:  Report from remote head and sinus CT 2001. FINDINGS: Brain: Generalized cerebral atrophy. Minor periventricular and deep white matter hypodensity typical of chronic small vessel ischemia, typical for age. No acute or prior infarct. No hemorrhage. No hydrocephalus. The basilar cisterns are patent. Gray-white differentiation is preserved. Vascular: Atherosclerosis of skullbase vasculature without hyperdense vessel or abnormal calcification. Skull: No fracture or focal lesion. Sinuses/Orbits: Opacification of right maxillary sinus with heterogeneous density and air-fluid level. There is circumferential mucosal thickening. Mild cortical thickening. Scattered opacification of ethmoid air cells and right frontal sinus. The mastoid air cells are clear. Orbits are unremarkable. Other: None. IMPRESSION: 1. Generalized cerebral atrophy and mild  chronic small vessel ischemia. No acute intracranial abnormality. 2. Chronic right maxillary sinusitis with heterogeneous contents. Electronically Signed   By: Narda Rutherford M.D.   On: 11/24/2020 17:08   CT CHEST WO CONTRAST  Result Date: 11/26/2020 CLINICAL DATA:  Aortic disease. Preoperative left ventricular assist device implant EXAM: CT CHEST WITHOUT CONTRAST TECHNIQUE: Multidetector CT imaging of the chest was performed following the standard protocol without IV contrast. COMPARISON:  November 23, 2020 FINDINGS: Cardiovascular: There is no appreciable thoracic aortic aneurysm. No evident intramural hematoma. There are foci of aortic atherosclerosis. There are scattered foci of calcification in proximal visualized great vessels. There are multiple foci of coronary artery calcification. Pacemaker lead attached to the right ventricle. There is stable left atrial and left ventricular prominence, unchanged from recent study. There is no pericardial effusion or pericardial thickening. Mediastinum/Nodes: Thyroid appears unremarkable. No demonstrable adenopathy. No appreciable esophageal lesions. Lungs/Pleura: Small pleural effusions bilaterally, essentially stable compared to recent study. There is atelectatic change in the right base. There is no edema or airspace opacity. Mild scarring noted in the left upper lobe anteriorly. No pneumothorax. Trachea and major bronchial structures appear unremarkable. Upper Abdomen: There is cholelithiasis. Gallbladder wall does not appear thickened. There is upper abdominal aortic atherosclerosis. Visualized upper abdominal structures otherwise appear normal. Musculoskeletal: Status post previous median sternotomy. Degenerative change noted throughout the thoracic spine. No blastic or lytic bone lesions. Pacemaker device present anteriorly on the left. IMPRESSION: 1. Status post median sternotomy with pacemaker lead attached to the right ventricle, unchanged. Stable  left-sided cardiac prominence. No pericardial effusion. There are foci of aortic atherosclerosis as well as foci of great vessel and coronary artery calcification. No thoracic aortic aneurysm. 2. Stable small pleural effusions bilaterally. No edema or airspace opacity. Right base atelectasis noted. 3.  No evident adenopathy. 4.  Cholelithiasis. Aortic Atherosclerosis (ICD10-I70.0). Electronically Signed   By: Bretta Bang III M.D.  On: 11/26/2020 08:49   CARDIAC CATHETERIZATION  Result Date: 11/23/2020 Findings: On milrinone 0.375 RA = 6 RV = 56/10 PA = 51/22 (33) PCW = 19 Thermal cardiac output/index = 5.5/2.7 Fick CO/CI = 6.2/3.0 PVR = 2.3 WU Ao sat = 94% PA sat = 59%, 61% PAPi = 4.8 Assessment: 1. Mild pulmonary venous HTN 2. Volume status improved 3. Cardiac output ok on milrinone 4. PAPI acceptable Plan/Discussion: Proceed with LVAD work-up. Glori Bickers, MD 8:14 AM  DG CHEST PORT 1 VIEW  Result Date: 12/02/2020 CLINICAL DATA:  Dyspnea EXAM: PORTABLE CHEST 1 VIEW COMPARISON:  11/17/2020, 11/26/2020 FINDINGS: Right-sided PICC line terminates at the level of the superior cavoatrial junction. Single lead left-sided implanted cardiac device. Post CABG changes. Stable cardiomegaly. Previously noted small pleural effusions are not evident on frontal view. No new focal airspace consolidation. No pneumothorax. IMPRESSION: 1. Stable exam.  No new acute cardiopulmonary findings. 2. Previously noted small pleural effusions are not evident on frontal view. Electronically Signed   By: Davina Poke D.O.   On: 12/02/2020 14:34   VAS US DOPPLER PRE VAD  Result Date: 11/24/2020 PERIOPERATIVE VASCULAR EVALUATION Indications:            Pre-VAD workup. Risk Factors:           Hypertension, hyperlipidemia, coronary artery disease. Vascular Interventions: Hx of CABG. Performing Technologist: Darlin Coco RDMS  Examination Guidelines: A complete evaluation includes B-mode imaging, spectral Doppler, color  Doppler, and power Doppler as needed of all accessible portions of each vessel. Bilateral testing is considered an integral part of a complete examination. Limited examinations for reoccurring indications may be performed as noted.  Right Carotid Findings: +----------+--------+--------+--------+---------------------------+--------+           PSV cm/sEDV cm/sStenosisDescribe                   Comments +----------+--------+--------+--------+---------------------------+--------+ CCA Prox  87      21                                                  +----------+--------+--------+--------+---------------------------+--------+ CCA Distal73      22              heterogenous                        +----------+--------+--------+--------+---------------------------+--------+ ICA Prox  61      17      1-39%   heterogenous and hypoechoic         +----------+--------+--------+--------+---------------------------+--------+ ICA Distal58      22                                                  +----------+--------+--------+--------+---------------------------+--------+ ECA       103     11                                                  +----------+--------+--------+--------+---------------------------+--------+ Portions of this table do not appear on this page. +----------+--------+-------+----------------+------------+  PSV cm/sEDV cmsDescribe        Arm Pressure +----------+--------+-------+----------------+------------+ Subclavian108            Multiphasic, WNL             +----------+--------+-------+----------------+------------+ +---------+--------+--+--------+--+---------+ VertebralPSV cm/s34EDV cm/s12Antegrade +---------+--------+--+--------+--+---------+ Left Carotid Findings: +----------+--------+--------+--------+-------------------+--------------------+           PSV cm/sEDV cm/sStenosisDescribe           Comments              +----------+--------+--------+--------+-------------------+--------------------+ CCA Prox  106     27              hyperechoic and                                                           heterogenous                            +----------+--------+--------+--------+-------------------+--------------------+ CCA Mid   159     40              heterogenous and                                                          hypoechoic                              +----------+--------+--------+--------+-------------------+--------------------+ CCA Distal182     34              heterogenous                            +----------+--------+--------+--------+-------------------+--------------------+ ICA Prox  302     102     80-99%  heterogenous and   Velocities may                                         hypoechoic         underestimate degree                                                      of stenosis due to                                                        more proximal  obstruction.         +----------+--------+--------+--------+-------------------+--------------------+ ICA Mid   281     113     80-99%  heterogenous                            +----------+--------+--------+--------+-------------------+--------------------+ ICA Distal137     40                                                      +----------+--------+--------+--------+-------------------+--------------------+ ECA       87      11                                                      +----------+--------+--------+--------+-------------------+--------------------+ +----------+--------+--------+----------------+------------+ SubclavianPSV cm/sEDV cm/sDescribe        Arm Pressure +----------+--------+--------+----------------+------------+           152             Multiphasic, [id number]           +----------+--------+--------+----------------+------------+ +---------+--------+--+--------+--+---------+ VertebralPSV cm/s36EDV cm/s14Antegrade +---------+--------+--+--------+--+---------+  ABI Findings: +--------+------------------+-----+---------+----------------------------------+ Right   Rt Pressure (mmHg)IndexWaveform Comment                            +--------+------------------+-----+---------+----------------------------------+ Brachial                       triphasicUnable to obtain pressure due to                                           bandaging/IV                       +--------+------------------+-----+---------+----------------------------------+ PTA     152               1.21 triphasic                                   +--------+------------------+-----+---------+----------------------------------+ DP      156               1.24 triphasic                                   +--------+------------------+-----+---------+----------------------------------+ +--------+------------------+-----+---------+-------+ Left    Lt Pressure (mmHg)IndexWaveform Comment +--------+------------------+-----+---------+-------+ [id number]                                     +--------+------------------+-----+---------+-------+ PTA     157               1.25 triphasic        +--------+------------------+-----+---------+-------+ DP      158               1.25 triphasic        +--------+------------------+-----+---------+-------+  Summary: Right  Carotid: Velocities in the right ICA are consistent with a 1-39% stenosis. Left Carotid: Velocities in the left ICA are consistent with a 80-99% stenosis. Vertebrals:  Bilateral vertebral arteries demonstrate antegrade flow. Subclavians: Normal flow hemodynamics were seen in bilateral subclavian              arteries.  *See table(s) above for measurements and observations. Right ABI: Resting right ankle-brachial index  is within normal range. No evidence of significant right lower extremity arterial disease. Left ABI: Resting left ankle-brachial index is within normal range. No evidence of significant left lower extremity arterial disease.  Electronically signed by Servando Snare MD on 11/24/2020 at 2:19:14 PM.    Final    VAS [place] LOWER EXTREMITY VENOUS (DVT)  Result Date: 11/24/2020  Lower Venous DVT Study Indications: Pre-VAD workup.  Anticoagulation: Heparin. Comparison Study: No prior studies. Performing Technologist: Darlin Coco RDMS  Examination Guidelines: A complete evaluation includes B-mode imaging, spectral Doppler, color Doppler, and power Doppler as needed of all accessible portions of each vessel. Bilateral testing is considered an integral part of a complete examination. Limited examinations for reoccurring indications may be performed as noted. The reflux portion of the exam is performed with the patient in reverse Trendelenburg.  +---------+---------------+---------+-----------+----------+--------------+ RIGHT    CompressibilityPhasicitySpontaneityPropertiesThrombus Aging +---------+---------------+---------+-----------+----------+--------------+ CFV      Full           Yes      Yes                                 +---------+---------------+---------+-----------+----------+--------------+ SFJ      Full                                                        +---------+---------------+---------+-----------+----------+--------------+ FV Prox  Full                                                        +---------+---------------+---------+-----------+----------+--------------+ FV Mid   Full                                                        +---------+---------------+---------+-----------+----------+--------------+ FV DistalFull                                                        +---------+---------------+---------+-----------+----------+--------------+ PFV       Full                                                        +---------+---------------+---------+-----------+----------+--------------+ POP      Full  Yes      Yes                                 +---------+---------------+---------+-----------+----------+--------------+ PTV      Full                                                        +---------+---------------+---------+-----------+----------+--------------+ PERO     Full                                                        +---------+---------------+---------+-----------+----------+--------------+   +---------+---------------+---------+-----------+----------+--------------+ LEFT     CompressibilityPhasicitySpontaneityPropertiesThrombus Aging +---------+---------------+---------+-----------+----------+--------------+ CFV      Full           Yes      Yes                                 +---------+---------------+---------+-----------+----------+--------------+ SFJ      Full                                                        +---------+---------------+---------+-----------+----------+--------------+ FV Prox  Full                                                        +---------+---------------+---------+-----------+----------+--------------+ FV Mid   Full                                                        +---------+---------------+---------+-----------+----------+--------------+ FV DistalFull                                                        +---------+---------------+---------+-----------+----------+--------------+ PFV      Full                                                        +---------+---------------+---------+-----------+----------+--------------+ POP      Full           Yes      Yes                                 +---------+---------------+---------+-----------+----------+--------------+ PTV  Full                                                         +---------+---------------+---------+-----------+----------+--------------+ PERO     Full                                                        +---------+---------------+---------+-----------+----------+--------------+     Summary: RIGHT: - There is no evidence of deep vein thrombosis in the lower extremity.  - A cystic structure is found in the popliteal fossa.  LEFT: - There is no evidence of deep vein thrombosis in the lower extremity.  - No cystic structure found in the popliteal fossa.  *See table(s) above for measurements and observations. Electronically signed by Servando Snare MD on 11/24/2020 at 2:19:44 PM.    Final    ECHOCARDIOGRAM LIMITED  Result Date: 11/22/2020    ECHOCARDIOGRAM LIMITED REPORT   Patient Name:   [name] Date of Exam: 11/22/2020 Medical Rec #:  [id number]          Height:       72.0 in Accession #:    [id number]         Weight:       180.8 lb Date of Birth:  04-Oct-1946         BSA:          2.041 m Patient Age:    55 years           BP:           103/67 mmHg Patient Gender: M                  HR:           59 bpm. Exam Location:  Inpatient Procedure: Limited Echo, Cardiac Doppler, Color Doppler and Intracardiac            Opacification Agent REPORT CONTAINS CRITICAL RESULT Indications:    CHF-Acute Systolic  History:        Patient has prior history of Echocardiogram examinations, most                 recent 10/24/2020. CAD and Previous Myocardial Infarction, Prior                 CABG and Defibrillator; Risk Factors:Dyslipidemia. ETOH abuse.  Sonographer:    Clayton Lefort RDCS (AE) Referring Phys: [id number] [place]  1. Large laminated mural apical thrombus. Left ventricular ejection fraction, by estimation, is <20%. The left ventricle has severely decreased function. The left ventricle demonstrates severe global hypokinesis with akinesis of the inferior, inferolateral, inferoseptal and apical walls. The left ventricular  internal cavity size was severely dilated.  2. Left atrial size was severely dilated.  3. The posterior MV leaflet is tethered and restricted. . The mitral valve is abnormal. Severe mitral valve regurgitation.  4. The aortic valve is tricuspid. There is mild calcification of the aortic valve. Aortic valve regurgitation is mild.  5. Right ventricular systolic function is moderately reduced. FINDINGS  Left Ventricle: Large laminated mural apical thrombus. Left ventricular ejection fraction, by estimation, is <20%. The  left ventricle has severely decreased function. The left ventricle demonstrates global hypokinesis. Definity contrast agent was given IV to delineate the left ventricular endocardial borders. The left ventricular internal cavity size was severely dilated. There is no left ventricular hypertrophy. Right Ventricle: Right ventricular systolic function is moderately reduced. Left Atrium: Left atrial size was severely dilated. Pericardium: There is no evidence of pericardial effusion. Mitral Valve: The posterior MV leaflet is tethered and restricted. The mitral valve is abnormal. There is moderate thickening of the mitral valve leaflet(s). Severe mitral valve regurgitation, with posteriorly-directed jet. Tricuspid Valve: The tricuspid valve is grossly normal. Aortic Valve: The aortic valve is tricuspid. There is mild calcification of the aortic valve. Aortic valve regurgitation is mild. Pulmonic Valve: The pulmonic valve was not assessed. Additional Comments: A pacer wire is visualized. LEFT VENTRICLE PLAX 2D LVIDd:         6.10 cm LVIDs:         5.40 cm LV PW:         1.10 cm LV IVS:        0.90 cm  LEFT ATRIUM         Index LA diam:    4.60 cm 2.25 cm/m  MR Peak grad:    121.9 mmHg MR Mean grad:    68.0 mmHg MR Vmax:         552.00 cm/s MR Vmean:        378.0 cm/s MR PISA:         3.08 cm MR PISA Eff ROA: 21 mm MR PISA Radius:  0.70 cm Glori Bickers MD Electronically signed by Glori Bickers MD  Signature Date/Time: 11/22/2020/8:31:45 PM    Final    [place] EKG SITE RITE  Result Date: 11/21/2020 If Site Rite image not attached, placement could not be confirmed due to current cardiac rhythm.  CT CHEST ABDOMEN PELVIS WO CONTRAST  Result Date: 11/23/2020 CLINICAL DATA:  Presurgical evaluation for LVAD EXAM: CT CHEST, ABDOMEN AND PELVIS WITHOUT CONTRAST TECHNIQUE: Multidetector CT imaging of the chest, abdomen and pelvis was performed following the standard protocol without IV contrast. COMPARISON:  CT angio chest, abdomen, pelvis 12/21/2015. FINDINGS: Lines and tubes: Right PICC with tip terminating at the superior cavoatrial junction. Two lead left chest wall cardiac pacemaker/defibrillator with leads terminating in the event right ventricle and right atria. CT CHEST FINDINGS Cardiovascular: Surgical changes related to a coronary artery bypass graft. Left atrial and ventricular enlargement. No significant pericardial effusion. The thoracic aorta is normal in caliber. No atherosclerotic plaque of the thoracic aorta. At least mild to moderate three-vessel coronary artery calcifications. Coronary artery calcifications. Mediastinum/Nodes: No enlarged mediastinal, hilar, or axillary lymph nodes. Thyroid gland, trachea, and esophagus demonstrate no significant findings. At least small volume hiatal hernia. Lungs/Pleura: No focal consolidation. Left apical atelectasis versus scarring again noted. No pulmonary nodule or mass. Bilateral trace, left greater than right, pleural effusions. No pneumothorax. Musculoskeletal: No chest wall abnormality. No suspicious lytic or blastic osseous lesions. No acute displaced fracture. Multilevel degenerative changes of the spine. Status post right shoulder rotator cuff anchor suture repair. CT ABDOMEN PELVIS FINDINGS Hepatobiliary: No focal liver abnormality is seen. Layering hyperdensity within the gallbladder lumen consistent with gallstones. No associated gallbladder wall  thickening or pericholecystic fluid. No biliary dilatation. Pancreas: No focal lesion. Normal pancreatic contour. No surrounding inflammatory changes. No main pancreatic ductal dilatation. Spleen: Normal in size without focal abnormality. A splenule is identified. Adrenals/Urinary Tract: No adrenal nodule bilaterally. Nonspecific bilateral perinephric  stranding. No nephrolithiasis, no hydronephrosis, and no contour-deforming renal mass. No ureterolithiasis or hydroureter. The urinary bladder is unremarkable. Stomach/Bowel: Stomach is within normal limits. No evidence of bowel wall thickening or dilatation. Suggestion of a second portion of the duodenum diverticula. Diffuse sigmoid diverticulosis. Mobile cecum. The appendix not visualized. Vascular/Lymphatic: No abdominal aorta or iliac aneurysm. At least moderate atherosclerotic plaque of the aorta and its branches. No abdominal, pelvic, or inguinal lymphadenopathy. Reproductive: Metallic density surrounding the prostate likely represent radiation therapy seeds. Otherwise the prostate is unremarkable. Other: Nonspecific mild presacral stranding that is likely related to radiation treatment of the prostate. No intraperitoneal free fluid. No intraperitoneal free gas. No organized fluid collection. Musculoskeletal: Anterior abdominal wall subcutaneus soft tissue densities and couple foci of gas likely related to medicine injections. Tiny fat containing umbilical hernia. No suspicious lytic or blastic osseous lesions. No acute displaced fracture. Multilevel degenerative changes of the spine. IMPRESSION: 1. Please note limited evaluation on this noncontrast study. 2. Bilateral trace pleural effusions. 3. Cholelithiasis with no CT findings to suggest acute cholecystitis or choledocholithiasis. 4. Diffuse sigmoid diverticulosis with no acute diverticulitis. 5. At least small volume hiatal hernia. 6. Anterior abdominal wall subcutaneus soft tissue densities and couple foci  of gas likely related to medicine injections. Please correlate with physical exam and history. 7. 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 8. Aortic Atherosclerosis (ICD10-I70.0) as well as at least mild to moderate three-vessel coronary artery calcifications. Electronically Signed   By: Iven Finn M.D.   On: 11/23/2020 18:14    Microbiology: No results found for this or any previous visit (from the past 240 hour(s)).   Labs: Basic Metabolic Panel: Recent Labs  Lab 11/29/20 0317 11/30/20 0523 12/01/20 0535 12/02/20 0226  NA 140 139 137 138  K 4.3 3.6 3.8 4.0  CL 106 104 105 104  CO2 25 23 23 24   GLUCOSE 102* 97 101* 104*  BUN 14 16 16 13   CREATININE 1.43* 1.37* 1.31* 1.35*  CALCIUM 8.8* 8.7* 8.7* 8.9   Liver Function Tests: No results for input(s): AST, ALT, ALKPHOS, BILITOT, PROT, ALBUMIN in the last 168 hours. No results for input(s): LIPASE, AMYLASE in the last 168 hours. No results for input(s): AMMONIA in the last 168 hours. CBC: Recent Labs  Lab 11/29/20 0317 11/30/20 0523 12/01/20 0535 12/02/20 0226  WBC 6.2 6.0 6.7 7.7  HGB 9.7* 9.1* 8.9* 9.5*  HCT 30.6* 30.2* 29.9* 31.7*  MCV 84.5 85.6 85.7 85.9  PLT 253 235 232 252   Cardiac Enzymes: No results for input(s): CKTOTAL, CKMB, CKMBINDEX, TROPONINI in the last 168 hours. BNP: BNP (last 3 results) Recent Labs    11/17/20 1645  BNP 3,663.4*    ProBNP (last 3 results) No results for input(s): PROBNP in the last 8760 hours.  CBG: No results for input(s): GLUCAP in the last 168 hours.  Principal Problem:   VT (ventricular tachycardia) (HCC) Active Problems:   Hyperlipidemia with target LDL less than 70   Carotid artery disease (HCC)   Acute on chronic combined systolic and diastolic CHF (congestive heart failure) (HCC)   CHF exacerbation (HCC)   Acute on chronic kidney failure ([place])   Time coordinating discharge: 38 minutes  Signed:        Lameisha Schuenemann,  DO [facility]  12/05/2020, 1:30 PM

## 2020-12-09 ENCOUNTER — Telehealth: Payer: Self-pay | Admitting: Cardiovascular Disease

## 2020-12-09 MED ORDER — MEXILETINE HCL 150 MG PO CAPS: 150.0000 mg | ORAL_CAPSULE | Freq: Two times a day (BID) | ORAL | 3 refills | Status: AC

## 2020-12-09 MED ORDER — AMIODARONE HCL 200 MG PO TABS: 200.0000 mg | ORAL_TABLET | Freq: Every day | ORAL | 3 refills | Status: AC

## 2020-12-09 NOTE — Telephone Encounter (Signed)
Per review of Dr. Clayborne Dana last consult note during Pt most recent hospitalization Pt should be discharged on: amiodarone 200 mg PO Daily Mexiletine 150 mg PO BID.  Spoke with Dr. Lovena Le and confirmed dosages.  Returned call to [place] and advised of above.

## 2020-12-09 NOTE — Telephone Encounter (Signed)
Pt c/o medication issue:  1. Name of Medication: amiodarone (PACERONE) 400 MG tablet and mexiletine (MEXITIL) 150 MG capsule  2. How are you currently taking this medication (dosage and times per day)? Amiodarone as directed  3. Are you having a reaction (difficulty breathing--STAT)? no  4. What is your medication issue? [name] from [place] would like to know if the patient should be taking both of these medications. Amiodarone was prescribed by our office whereas Mexiletine was not.

## 2020-12-24 ENCOUNTER — Telehealth: Payer: Self-pay | Admitting: Internal Medicine

## 2020-12-24 ENCOUNTER — Telehealth: Payer: Self-pay | Admitting: Cardiovascular Disease

## 2020-12-24 NOTE — Telephone Encounter (Signed)
[name] from [facility] calling, states since Tuesday the patient has been experiencing pain below the left rib up to his chest. Its a sharp pain that comes and goes. She also noticed a crackle in the left lung. Patient also has a fever of 99.2. Patient is taking morphine and it does help some but she wanted to make sure that there wasn't anything else we could do for the patient.  [name]

## 2020-12-24 NOTE — Telephone Encounter (Signed)
Pt c/o of Chest Pain: STAT if CP now or developed within 24 hours  1. Are you having CP right now? Yes, but is not currently with patient  2. Are you experiencing any other symptoms (ex. SOB, nausea, vomiting, sweating)? Panting last night, pain was a 10/10 last night  3. How long have you been experiencing CP? yesterday  4. Is your CP continuous or coming and going? Comes and goes  5. Have you taken Nitroglycerin? No   [name] from [facility] states the patient has been having chest pain since last night. She states it comes and goes. She states he is currently having chest pain, but is no longer with the patient. She states the pain is on the left side of his chest and he has been taking morphine. She states his BP 133/79 HR 80, his left lung sounds diminished and temp 99.2. She states to call the patient back.

## 2020-12-24 NOTE — Telephone Encounter (Signed)
See below

## 2020-12-24 NOTE — Telephone Encounter (Signed)
Called pt and spoke with his wife. Pt is currently on hospice. He has been experiencing 10/10 CP for the last few days. Pain described as substernal, radiating to his back and left shoulder. They contacted his hospice RN and she was advised to use his morphine for CP. She states he continues to have CP in between doses of his morphine. ICD therapy has been discontinued.   I educated her on how and when to use nitro tabs for CP relief. She will begin to use it in conjunction with his morphine, monitoring his BP. She will call the office back if his CP continues to not be relieved for further advisement from Dr. Acie Fredrickson. She was advised to contact his hospice nurse for clarification on when to use his morphine.   She had no additional questions at this time.

## 2020-12-25 NOTE — Telephone Encounter (Signed)
LDVM for [name] with [facility] advising her of Dr. Nathanial Millman instructions. Office number was provided in case she has additional questions.

## 2020-12-25 NOTE — Telephone Encounter (Signed)
Can use ativan for sensation of SOB as well as the morphine. This is likely coming from fluid from his heart failure and with his heart function there is not much we can do for this.

## 2020-12-26 ENCOUNTER — Emergency Department (HOSPITAL_COMMUNITY)
Admission: EM | Admit: 2020-12-26 | Discharge: 2020-12-26 | Disposition: A | Discharge status: Home / Self Care | Attending: Emergency Medicine | Admitting: Emergency Medicine

## 2020-12-26 ENCOUNTER — Other Ambulatory Visit: Payer: Self-pay

## 2020-12-26 DIAGNOSIS — N365 Urethral false passage: Secondary | ICD-10-CM | POA: Diagnosis not present

## 2020-12-26 DIAGNOSIS — R339 Retention of urine, unspecified: Secondary | ICD-10-CM | POA: Insufficient documentation

## 2020-12-26 DIAGNOSIS — Z8546 Personal history of malignant neoplasm of prostate: Secondary | ICD-10-CM | POA: Insufficient documentation

## 2020-12-26 DIAGNOSIS — I5043 Acute on chronic combined systolic (congestive) and diastolic (congestive) heart failure: Secondary | ICD-10-CM | POA: Diagnosis not present

## 2020-12-26 DIAGNOSIS — I130 Hypertensive heart and chronic kidney disease with heart failure and stage 1 through stage 4 chronic kidney disease, or unspecified chronic kidney disease: Secondary | ICD-10-CM | POA: Diagnosis not present

## 2020-12-26 DIAGNOSIS — Z85818 Personal history of malignant neoplasm of other sites of lip, oral cavity, and pharynx: Secondary | ICD-10-CM | POA: Diagnosis not present

## 2020-12-26 DIAGNOSIS — C61 Malignant neoplasm of prostate: Secondary | ICD-10-CM | POA: Diagnosis not present

## 2020-12-26 DIAGNOSIS — Z951 Presence of aortocoronary bypass graft: Secondary | ICD-10-CM | POA: Diagnosis not present

## 2020-12-26 DIAGNOSIS — Z7982 Long term (current) use of aspirin: Secondary | ICD-10-CM | POA: Diagnosis not present

## 2020-12-26 DIAGNOSIS — R0902 Hypoxemia: Secondary | ICD-10-CM | POA: Diagnosis not present

## 2020-12-26 DIAGNOSIS — I2510 Atherosclerotic heart disease of native coronary artery without angina pectoris: Secondary | ICD-10-CM | POA: Insufficient documentation

## 2020-12-26 DIAGNOSIS — N1831 Chronic kidney disease, stage 3a: Secondary | ICD-10-CM | POA: Insufficient documentation

## 2020-12-26 DIAGNOSIS — R338 Other retention of urine: Secondary | ICD-10-CM | POA: Diagnosis not present

## 2020-12-26 DIAGNOSIS — K5903 Drug induced constipation: Secondary | ICD-10-CM | POA: Diagnosis not present

## 2020-12-26 MED ORDER — DOCUSATE SODIUM 283 MG RE ENEM: 1.0000 | ENEMA | Freq: Every day | RECTAL | 0 refills | Status: AC

## 2020-12-26 MED ORDER — POLYETHYLENE GLYCOL 3350 17 G PO PACK: 17.0000 g | PACK | Freq: Every day | ORAL | 0 refills | Status: AC

## 2020-12-26 NOTE — Discharge Instructions (Signed)
Please keep the Foley catheter in place, you will be contacted by urology to discuss further management of this.  If the Foley catheter stops draining urine, please return to the emergency department.  I have called in a prescription for MiraLAX as well as an enema to help you with constipation.  Please take the MiraLAX every day, if you do not have a bowel movement in the next 1 to 2 days please use the enema as well.

## 2020-12-26 NOTE — ED Triage Notes (Signed)
Pt BIB [facility] for urinary retention.  Pt last voided at 2300 on 12/25/20.  Pt has not deficated x4 days. Pt is hospice care for CHF.  The hospice nurse attempted a catheter unsuccessfully at his home.    He received 15mg  Morphine around 1845  on scene prior to transport. Pt was found to be 88% on RA on scene, EMS reports that they kept him on 2L [place] until arrival but that he was 97% on RA when giving me report.

## 2020-12-26 NOTE — ED Notes (Signed)
Foley attempted without success.  Resistance was met.  EDP informed [place] will be needed. 

## 2020-12-26 NOTE — ED Provider Notes (Signed)
[facility]ERGENCY DEPARTMENT Provider Note   CSN: [id number] Arrival date & time: 12/26/20  1959     History Chief Complaint  Patient presents with  . Urinary Retention    [name] is a 75 y.o. male.  The history is provided by the EMS personnel, medical records and the nursing home.  Constipation Severity:  Moderate Time since last bowel movement:  4 days Timing:  Constant Progression:  Unchanged Chronicity:  New Context: dehydration, medication and narcotics   Stool description:  None produced Relieved by:  Nothing Worsened by:  Narcotic pain medications Ineffective treatments:  None tried Level 5 caveat due to baseline dementia/confusion.     Past Medical History:  Diagnosis Date  . Adult ADHD   . AICD (automatic cardioverter/defibrillator) present   . CAD (coronary artery disease)    Cath 2008, all grafts patent // Cath 2017 - all grafts patent   . 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 Carotid artery disease ([place])    Doppler, February, 2012, mild plaque, 0-39% bilateral, plan followup in one year  . Dyspepsia   . Ejection fraction < 50%   . Elevated PSA 02/15/2011  . Esophageal stricture   . GERD (gastroesophageal reflux disease)   . HTN (hypertension)   . Hx of CABG    1991  . Hyperlipidemia   . 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 Myocardial infarction ([place]) 1998  . Prostate cancer ([place]) 2012   IMRT, ADT  . Thrombosis of arm    Left  . Venous thrombosis    Left arm    Patient Active Problem List   Diagnosis Date Noted  . Acute on chronic kidney failure ([place]) 11/18/2020  . CHF exacerbation ([place]) 11/17/2020  . Fever 11/13/2020  . Cough 11/13/2020  . DOE (dyspnea on exertion) 10/23/2020  . URI (upper respiratory  infection) 10/23/2020  . Medicare annual wellness visit, subsequent 05/19/2017  . Allergic rhinitis 05/05/2017  . Orthostatic hypotension 05/11/2016  . Elevated LFTs 03/24/2016  . Impaired fasting blood sugar 01/10/2016  . Acute on chronic combined systolic and diastolic CHF (congestive heart failure) ([place]) 12/30/2015  . Epigastric pain 12/21/2015  . Atypical chest pain 12/21/2015  . ST elevation myocardial infarction (STEMI) ([place])   . Hx of CABG   . Adult ADHD   . Ejection fraction < 50%   . ICD (implantable cardioverter-defibrillator) in place 03/10/2015  . VT (ventricular tachycardia) ([place]) 03/05/2014  . Routine health maintenance 12/11/2013  . Advanced care planning/counseling discussion 12/10/2013  . Cardiomyopathy, ischemic   . Coronary artery disease, occlusive   . Essential hypertension   . Hyperlipidemia with target LDL less than 70   . Encounter for servicing of automatic implantable cardioverter-defibrillator (AICD) at end of battery life   . Carotid artery disease ([place])   . Prostate cancer ([place]) 11/09/2011  . 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 ESOPHAGEAL STRICTURE 08/23/2008    Past Surgical History:  Procedure Laterality Date  . CARDIAC CATHETERIZATION  12/05/06  . CARDIAC CATHETERIZATION N/A 12/21/2015   Procedure: Left Heart Cath and Coronary Angiography;  Surgeon: Lorretta Harp, MD;  Location: [facility];  Service: Cardiovascular;  Laterality: N/A;  . CORONARY ARTERY BYPASS GRAFT  LIMA_LAD, SVG-OM, Cx, RCA '91  . CYSTECTOMY     Hand  . 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 ICD Placement    . IMPLANTABLE CARDIOVERTER DEFIBRILLATOR (ICD) GENERATOR CHANGE  04/06/2015   Procedure: Icd Generator Change;  Surgeon: Evans Lance, MD;  Location: [facility];  Service: Cardiovascular;;  . LEFT HEART CATH AND CORS/GRAFTS ANGIOGRAPHY N/A 10/23/2020   Procedure: LEFT HEART CATH AND CORS/GRAFTS  ANGIOGRAPHY;  Surgeon: Jettie Booze, MD;  Location: [facility];  Service: Cardiovascular;  Laterality: N/A;  . menistectomies, left knee  '09  . RIGHT HEART CATH N/A 11/23/2020   Procedure: RIGHT HEART CATH;  Surgeon: Jolaine Artist, MD;  Location: [facility];  Service: Cardiovascular;  Laterality: N/A;  . ROTATOR CUFF REPAIR  2005, 2007  . Thumb Surgery    . TONSILLECTOMY  2005   Malignant lesion, w/XRT       Family History  Problem Relation Age of Onset  . Alzheimer's disease Mother   . Dementia Mother        living in [place]  . Hyperlipidemia Father   . Hypertension Father   . CAD Father        had pacemaker  . Congestive Heart Failure Father   . Heart disease Brother        Bypass surgery  . Breast cancer Sister        survivor  . Colon cancer Neg Hx   . Prostate cancer Neg Hx   . Diabetes Neg Hx     Social History   Tobacco Use  . Smoking status: Never Smoker  . Smokeless tobacco: Never Used  Vaping Use  . Vaping Use: Never used  Substance Use Topics  . Alcohol use: No  . Drug use: No    Home Medications Prior to Admission medications   Medication Sig Start Date End Date Taking? Authorizing Provider  docusate sodium (ENEMEEZ) 283 MG enema Place 1 enema (283 mg total) rectally daily. 12/26/20  Yes Camila Li, MD  polyethylene glycol (MIRALAX MIX-IN PAX) 17 g packet Take 17 g by mouth daily. 12/26/20  Yes Camila Li, MD  amiodarone (PACERONE) 200 MG tablet Take 1 tablet (200 mg total) by mouth daily. 12/09/20   Evans Lance, MD  aspirin 81 MG EC tablet Take 81 mg by mouth daily.    [provider]  buPROPion (WELLBUTRIN XL) 300 MG 24 hr tablet Take 300 mg by mouth daily.    [provider]  escitalopram (LEXAPRO) 20 MG tablet Take 20 mg by mouth daily.    [provider]  esomeprazole (NEXIUM) 20 MG packet Take 20 mg by mouth daily before breakfast.    [provider]  feeding supplement  (ENSURE ENLIVE / ENSURE PLUS) LIQD Take 237 mLs by mouth 2 (two) times daily between meals. 12/05/20   Swayze, Ava, DO  hyoscyamine (LEVSIN SL) 0.125 MG SL tablet Place 1 tablet (0.125 mg total) under the tongue every 4 (four) hours as needed (excess oral secretions). 12/05/20   Swayze, Ava, DO  loperamide (IMODIUM) 2 MG capsule Take 1 capsule (2 mg total) by mouth as needed for diarrhea or loose stools. 12/05/20   Swayze, Ava, DO  LORazepam (ATIVAN) 0.5 MG tablet Take 1 tablet (0.5 mg total) by mouth every 4 (four) hours as needed for anxiety. May crush, mix with water and give sublingually if  needed. 12/05/20   Swayze, Ava, DO  mexiletine (MEXITIL) 150 MG capsule Take 1 capsule (150 mg total) by mouth 2 (two) times daily. 12/09/20   Evans Lance, MD  morphine (ROXANOL) 20 MG/ML concentrated solution Take 0.5 mLs (10 mg total) by mouth every 4 (four) hours as needed for severe pain or shortness of breath. May give sublingually if needed. 12/05/20   Swayze, Ava, DO  Multiple Vitamins-Minerals (OCUVITE PRESERVISION) TABS Take 1 tablet by mouth daily.    [provider]  nitroGLYCERIN (NITROSTAT) 0.4 MG SL tablet Place 1 tablet (0.4 mg total) under the tongue every 5 (five) minutes as needed for chest pain. 12/23/15   Bhagat, Bhavinkumar, PA  ondansetron (ZOFRAN) 4 MG tablet Take 1 tablet (4 mg total) by mouth every 8 (eight) hours as needed for nausea or vomiting. 12/05/20   Swayze, Ava, DO  prochlorperazine (COMPAZINE) 10 MG tablet Take 1 tablet (10 mg total) by mouth every 6 (six) hours as needed for nausea or vomiting. 12/05/20   Swayze, Ava, DO  zolpidem (AMBIEN) 10 MG tablet Take 1 tablet by mouth at bedtime. 02/28/14   [provider]    Allergies    Other  Review of Systems   Review of Systems  Unable to perform ROS: Dementia  Gastrointestinal: Positive for constipation.  Genitourinary: Positive for decreased urine volume and frequency.    Physical Exam Updated Vital Signs BP 138/80    Pulse 85   Temp 98.4 F (36.9 C) (Oral)   Resp 18   SpO2 99%   Physical Exam Vitals and nursing note reviewed.  Constitutional:      Appearance: He is well-developed and well-nourished.  HENT:     Head: Normocephalic and atraumatic.  Eyes:     Conjunctiva/sclera: Conjunctivae normal.  Cardiovascular:     Rate and Rhythm: Normal rate and regular rhythm.     Heart sounds: No murmur heard.   Pulmonary:     Effort: Pulmonary effort is normal. No respiratory distress.     Breath sounds: Normal breath sounds.  Abdominal:     General: There is no distension.     Palpations: Abdomen is soft.     Tenderness: There is no abdominal tenderness.     Comments: Suprapubic fullness to palpation, no significant tenderness  Musculoskeletal:        General: No edema.     Cervical back: Neck supple.  Skin:    General: Skin is warm and dry.  Neurological:     Mental Status: He is alert. Mental status is at baseline. He is confused.     Comments: Confusion is baseline  Psychiatric:        Mood and Affect: Mood and affect normal.     ED Results / Procedures / Treatments   Labs (all labs ordered are listed, but only abnormal results are displayed) Labs Reviewed - No data to display  EKG None  Radiology No results found.  Procedures Procedures  Medications Ordered in ED Medications - No data to display  ED Course  I have reviewed the triage vital signs and the nursing notes.  Pertinent labs & imaging results that were available during my care of the patient were reviewed by me and considered in my medical decision making (see chart for details).    MDM Rules/Calculators/A&P                          This is  a 75 year old male with a past medical history significant ofCAD status post CABG and PCI, chronic combined systolic and diastolic CHF/ischemic cardiomyopathy with LVEF 20-25% and history of VT status post ICD, hypertension, hyperlipidemia,CKD stage IIIa,history of  prostate and tonsillar cancer, who is currently DNR comfort care on hospice and has been receiving morphine for analgesia who presents with acute urinary retention as well as constipation.  Per hospice notes, the patient has not voided since yesterday around 2200 and has not had a bowel movement in 4 days.  The patient denies any significant abdominal pain, vomiting.  On exam the patient is afebrile, hemodynamically stable, he does have some firm fullness overlying the suprapubic area which is likely underlying bladder, otherwise his abdomen is nondistended and nontender.  Bladder scanner showed volume of 400 cc, usual catheter was unable to be placed by nursing. Nursing attempted traditional catheter without success, coude catheter (16 and 58F) were attempted without success either. Urology was consulted, catheter placed with dark urine. Patient had relief with catheter placement. Clinically he is constipated without reported bowel movement in four days; he is not vomiting, his abdomen is soft, he has no signs of peritonitis. We will discharge him with Rx for miralax daily and docusate enema if no bowel movement in the next 1-2 days. Constipation likely from his increased morphine dose for his palliative/comfort care analgesia.   Final Clinical Impression(s) / ED Diagnoses Final diagnoses:  Urinary retention  Drug-induced constipation    Rx / DC Orders ED Discharge Orders         Ordered    polyethylene glycol (MIRALAX MIX-IN PAX) 17 g packet  Daily        12/26/20 2208    docusate sodium (ENEMEEZ) 283 MG enema  Daily        12/26/20 2208           Camila Li, MD 12/27/20 [id number]    Carmin Muskrat, MD 12/27/20 1740

## 2020-12-26 NOTE — ED Notes (Signed)
Urology cart from OR at bedside

## 2020-12-26 NOTE — Consult Note (Signed)
Reason for Consult: Urinary Retention, Prostate Cancer  Referring Physician: Carmin Muskrat MD  [name] is an 75 y.o. male.   HPI:   1 - Urinary Retention - new urinary retention 12/26/2020 with inability to void x 1 day. PVR only 446mL. Admits to some poor PO intake. No h/o prior retention. Several attempts catheter placement in ER initially not succesful.  2 -  Prostate Cancer - s/p external beam radition for unknown grade/stage disease. Last PSA 2018 0.26 c/w good disease control.  PMH sig for Home Hospice, CAD/CABD/AICD (no blood thinners). 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  Today "[name]" is seen for evaluation of urinary retention. He is on home hospice. He adamantly wants help with bladder drainage even if procedural despite minimal pain.   Past Medical History:  Diagnosis Date  . Adult ADHD   . AICD (automatic cardioverter/defibrillator) present   . CAD (coronary artery disease)    Cath 2008, all grafts patent // Cath 2017 - all grafts patent   . 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 Carotid artery disease ([place])    Doppler, February, 2012, mild plaque, 0-39% bilateral, plan followup in one year  . Dyspepsia   . Ejection fraction < 50%   . Elevated PSA 02/15/2011  . Esophageal stricture   . GERD (gastroesophageal reflux disease)   . HTN (hypertension)   . Hx of CABG    1991  . Hyperlipidemia   . 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 Myocardial infarction ([place]) 1998  . Prostate cancer ([place]) 2012   IMRT, ADT  . Thrombosis of arm    Left  . Venous thrombosis    Left arm    Past Surgical History:  Procedure Laterality Date  . CARDIAC CATHETERIZATION  12/05/06  . CARDIAC CATHETERIZATION N/A 12/21/2015   Procedure: Left  Heart Cath and Coronary Angiography;  Surgeon: Lorretta Harp, MD;  Location: [facility];  Service: Cardiovascular;  Laterality: N/A;  . CORONARY ARTERY BYPASS GRAFT     LIMA_LAD, SVG-OM, Cx, RCA '91  . CYSTECTOMY     Hand  . 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 ICD Placement    . IMPLANTABLE CARDIOVERTER DEFIBRILLATOR (ICD) GENERATOR CHANGE  04/06/2015   Procedure: Icd Generator Change;  Surgeon: Evans Lance, MD;  Location: [facility];  Service: Cardiovascular;;  . LEFT HEART CATH AND CORS/GRAFTS ANGIOGRAPHY N/A 10/23/2020   Procedure: LEFT HEART CATH AND CORS/GRAFTS ANGIOGRAPHY;  Surgeon: Jettie Booze, MD;  Location: [facility];  Service: Cardiovascular;  Laterality: N/A;  . menistectomies, left knee  '09  . RIGHT HEART CATH N/A 11/23/2020   Procedure: RIGHT HEART CATH;  Surgeon: Jolaine Artist, MD;  Location: [facility];  Service: Cardiovascular;  Laterality: N/A;  . ROTATOR CUFF REPAIR  2005, 2007  . Thumb Surgery    . TONSILLECTOMY  2005   Malignant lesion, w/XRT    Family History  Problem Relation Age of Onset  . Alzheimer's disease Mother   . Dementia Mother        living in [place]  . Hyperlipidemia Father   . Hypertension Father   .  CAD Father        had pacemaker  . Congestive Heart Failure Father   . Heart disease Brother        Bypass surgery  . Breast cancer Sister        survivor  . Colon cancer Neg Hx   . Prostate cancer Neg Hx   . Diabetes Neg Hx     Social History:  reports that he has never smoked. He has never used smokeless tobacco. He reports that he does not drink alcohol and does not use drugs.  Allergies:  Allergies  Allergen Reactions  . Other Anaphylaxis    Insect bites (Has EpiPen)    Medications: I have reviewed the patient's current medications.  No results found for this or any previous visit (from the past 48 hour(s)).  No results  found.  Review of Systems  Constitutional: Positive for fatigue and unexpected weight change. Negative for chills and fever.  Gastrointestinal: Negative for abdominal distention.  Genitourinary: Positive for decreased urine volume. Negative for flank pain.  All other systems reviewed and are negative.  Blood pressure 140/82, pulse 85, temperature 98.8 F (37.1 C), temperature source Oral, resp. rate 16, SpO2 99 %. Physical Exam Constitutional:      Comments: Pleasant, frail, AOx3.   HENT:     Nose: Nose normal.  Eyes:     Pupils: Pupils are equal, round, and reactive to light.  Cardiovascular:     Rate and Rhythm: Normal rate.     Comments: Midline sternotomy scar Pulmonary:     Effort: Pulmonary effort is normal.  Abdominal:     General: Abdomen is flat.  Genitourinary:    Comments: Circumcised. Some scant dried blood at meatus.  Musculoskeletal:        General: Normal range of motion.     Cervical back: Normal range of motion.  Skin:    General: Skin is warm.  Neurological:     Mental Status: He is alert.  Psychiatric:        Mood and Affect: Mood normal.     BEDSIDE CYSTO / CATHETER PLACEMENT COMPLICATED:  After discussed goals of care with pt, cystourethroscopy performed using aseptic technique with 61f flexible cystoscopy. Mild evidence of pendulous urethra and bulbar catheter trauma with subtle early false pass in bulb, true lumen 12 O-clock. Prostatic urethra moderately obstructing. Bladder distended, trabeculated. Marland Kitchen038 sensor wire placed into bladder via cystoscope and 64F council catheter placed over wire. 10cc sterile water in balloon. Connected to gravity bag with immediate efflux of 432mL very concentrated urine. He tolerated this exceptionally well.   Assessment/Plan:  1 - Urinary Retention - s/p catheter placement over wire as per above. Early false pass noted bulbar urethra. I feel future foley with coude only would likely be successfully after healing x 1  mo with current catheter.   2 -  Prostate Cancer - good biochemical control most recently. NO indicaiton for further intervention given on hospice.  OK for DC from Urol perspective with catheter in place. We will request office foley change with [place] in about 1 mo as I do not feel appropriate for hospice to change as was somewhat difficult.   Alexis Frock 12/26/2020, 9:45 PM

## 2020-12-26 NOTE — ED Notes (Signed)
Assumed care of patient for discharge only. [facility] at bedside to transport patient home. Discharge instructions provided to patient. Verbalized understanding. Alert and oriented. Escorted out of ED via stretcher.

## 2020-12-26 NOTE — ED Notes (Signed)
Resident at bedside. Bladder scan showed a volume of 483mL.

## 2020-12-26 NOTE — ED Notes (Signed)
Patient is resting comfortably. 

## 2020-12-31 ENCOUNTER — Encounter: Payer: Medicare Other | Admitting: Internal Medicine

## 2021-01-06 ENCOUNTER — Telehealth: Payer: Self-pay | Admitting: Emergency Medicine

## 2021-01-06 NOTE — Telephone Encounter (Signed)
Rickie Dileonardo (wife) called to notify the clinic that her husband [name] died on 01-09-21 and needed to know what to do with remote monitor.. Expressed condolences on behalf of practice. Confirmed address and return kit from [employer] will be sent to home to return home monitor.

## 2021-01-26 DEATH — deceased

## 2021-04-15 ENCOUNTER — Encounter (INDEPENDENT_AMBULATORY_CARE_PROVIDER_SITE_OTHER): Payer: Medicare Other | Admitting: Ophthalmology
